# Patient Record
Sex: Female | Born: 1945 | Race: White | Hispanic: No | Marital: Married | Smoking: Former smoker
Health system: Southern US, Community
[De-identification: ages and names within clinical notes are randomized; demographics above are authoritative.]

## PROBLEM LIST (undated history)

## (undated) DIAGNOSIS — I340 Nonrheumatic mitral (valve) insufficiency: Secondary | ICD-10-CM

## (undated) DIAGNOSIS — Z9289 Personal history of other medical treatment: Secondary | ICD-10-CM

## (undated) DIAGNOSIS — I219 Acute myocardial infarction, unspecified: Secondary | ICD-10-CM

## (undated) DIAGNOSIS — I639 Cerebral infarction, unspecified: Secondary | ICD-10-CM

## (undated) DIAGNOSIS — G5600 Carpal tunnel syndrome, unspecified upper limb: Secondary | ICD-10-CM

## (undated) DIAGNOSIS — D649 Anemia, unspecified: Secondary | ICD-10-CM

## (undated) DIAGNOSIS — K635 Polyp of colon: Secondary | ICD-10-CM

## (undated) DIAGNOSIS — C569 Malignant neoplasm of unspecified ovary: Secondary | ICD-10-CM

## (undated) DIAGNOSIS — K659 Peritonitis, unspecified: Secondary | ICD-10-CM

## (undated) DIAGNOSIS — E785 Hyperlipidemia, unspecified: Secondary | ICD-10-CM

## (undated) DIAGNOSIS — I2510 Atherosclerotic heart disease of native coronary artery without angina pectoris: Secondary | ICD-10-CM

## (undated) DIAGNOSIS — I10 Essential (primary) hypertension: Secondary | ICD-10-CM

## (undated) DIAGNOSIS — N186 End stage renal disease: Secondary | ICD-10-CM

## (undated) HISTORY — PX: CORONARY ARTERY BYPASS GRAFT: SHX141

## (undated) HISTORY — DX: Essential (primary) hypertension: I10

## (undated) HISTORY — DX: Hyperlipidemia, unspecified: E78.5

## (undated) HISTORY — DX: Carpal tunnel syndrome, unspecified upper limb: G56.00

## (undated) HISTORY — DX: Malignant neoplasm of unspecified ovary: C56.9

## (undated) HISTORY — DX: Atherosclerotic heart disease of native coronary artery without angina pectoris: I25.10

## (undated) HISTORY — DX: Anemia, unspecified: D64.9

## (undated) HISTORY — PX: CARDIAC CATHETERIZATION: SHX172

## (undated) HISTORY — PX: TONSILLECTOMY: SUR1361

## (undated) HISTORY — DX: Cerebral infarction, unspecified: I63.9

## (undated) HISTORY — PX: ABDOMINAL HYSTERECTOMY: SHX81

---

## 1993-03-25 DIAGNOSIS — C569 Malignant neoplasm of unspecified ovary: Secondary | ICD-10-CM

## 1993-03-25 HISTORY — DX: Malignant neoplasm of unspecified ovary: C56.9

## 1993-03-25 HISTORY — PX: OTHER SURGICAL HISTORY: SHX169

## 1997-07-31 ENCOUNTER — Emergency Department (HOSPITAL_COMMUNITY): Admission: EM | Admit: 1997-07-31 | Discharge: 1997-07-31 | Payer: Self-pay | Admitting: Emergency Medicine

## 1999-04-04 ENCOUNTER — Encounter: Payer: Self-pay | Admitting: Gynecology

## 1999-04-04 ENCOUNTER — Encounter: Admission: RE | Admit: 1999-04-04 | Discharge: 1999-04-04 | Payer: Self-pay | Admitting: Gynecology

## 1999-04-25 ENCOUNTER — Encounter: Payer: Self-pay | Admitting: Gynecology

## 1999-04-25 ENCOUNTER — Encounter: Admission: RE | Admit: 1999-04-25 | Discharge: 1999-04-25 | Payer: Self-pay | Admitting: Gynecology

## 1999-07-16 ENCOUNTER — Encounter: Payer: Self-pay | Admitting: Internal Medicine

## 1999-07-16 ENCOUNTER — Ambulatory Visit (HOSPITAL_COMMUNITY): Admission: RE | Admit: 1999-07-16 | Discharge: 1999-07-16 | Payer: Self-pay | Admitting: Internal Medicine

## 2000-04-08 ENCOUNTER — Encounter: Admission: RE | Admit: 2000-04-08 | Discharge: 2000-04-08 | Payer: Self-pay | Admitting: Gynecology

## 2000-04-08 ENCOUNTER — Encounter: Payer: Self-pay | Admitting: Gynecology

## 2000-04-09 ENCOUNTER — Other Ambulatory Visit: Admission: RE | Admit: 2000-04-09 | Discharge: 2000-04-09 | Payer: Self-pay | Admitting: Gynecology

## 2000-04-14 ENCOUNTER — Encounter: Admission: RE | Admit: 2000-04-14 | Discharge: 2000-04-14 | Payer: Self-pay | Admitting: Internal Medicine

## 2000-04-14 ENCOUNTER — Encounter: Payer: Self-pay | Admitting: Internal Medicine

## 2001-04-20 ENCOUNTER — Encounter: Payer: Self-pay | Admitting: Gynecology

## 2001-04-20 ENCOUNTER — Encounter: Admission: RE | Admit: 2001-04-20 | Discharge: 2001-04-20 | Payer: Self-pay | Admitting: Gynecology

## 2001-04-24 ENCOUNTER — Other Ambulatory Visit: Admission: RE | Admit: 2001-04-24 | Discharge: 2001-04-24 | Payer: Self-pay | Admitting: Gynecology

## 2001-06-04 ENCOUNTER — Ambulatory Visit (HOSPITAL_COMMUNITY): Admission: RE | Admit: 2001-06-04 | Discharge: 2001-06-04 | Payer: Self-pay | Admitting: Gastroenterology

## 2002-04-30 ENCOUNTER — Other Ambulatory Visit: Admission: RE | Admit: 2002-04-30 | Discharge: 2002-04-30 | Payer: Self-pay | Admitting: Gynecology

## 2002-07-14 ENCOUNTER — Encounter: Payer: Self-pay | Admitting: Gynecology

## 2002-07-14 ENCOUNTER — Encounter: Admission: RE | Admit: 2002-07-14 | Discharge: 2002-07-14 | Payer: Self-pay | Admitting: Gynecology

## 2002-07-21 ENCOUNTER — Ambulatory Visit: Admission: RE | Admit: 2002-07-21 | Discharge: 2002-07-21 | Payer: Self-pay | Admitting: Gynecology

## 2002-09-24 ENCOUNTER — Encounter: Admission: RE | Admit: 2002-09-24 | Discharge: 2002-09-24 | Payer: Self-pay | Admitting: Gynecology

## 2002-09-24 ENCOUNTER — Ambulatory Visit (HOSPITAL_COMMUNITY): Admission: RE | Admit: 2002-09-24 | Discharge: 2002-09-24 | Payer: Self-pay | Admitting: Gynecology

## 2002-09-24 ENCOUNTER — Encounter: Payer: Self-pay | Admitting: Gynecology

## 2002-09-29 ENCOUNTER — Ambulatory Visit: Admission: RE | Admit: 2002-09-29 | Discharge: 2002-09-29 | Payer: Self-pay | Admitting: Gynecology

## 2002-10-20 ENCOUNTER — Emergency Department (HOSPITAL_COMMUNITY): Admission: EM | Admit: 2002-10-20 | Discharge: 2002-10-20 | Payer: Self-pay | Admitting: Emergency Medicine

## 2002-10-20 ENCOUNTER — Encounter: Payer: Self-pay | Admitting: Emergency Medicine

## 2003-01-24 ENCOUNTER — Ambulatory Visit (HOSPITAL_COMMUNITY): Admission: RE | Admit: 2003-01-24 | Discharge: 2003-01-24 | Payer: Self-pay | Admitting: Gynecology

## 2003-01-26 ENCOUNTER — Ambulatory Visit: Admission: RE | Admit: 2003-01-26 | Discharge: 2003-01-26 | Payer: Self-pay | Admitting: Gynecology

## 2003-02-11 ENCOUNTER — Encounter: Admission: RE | Admit: 2003-02-11 | Discharge: 2003-02-11 | Payer: Self-pay | Admitting: Gynecology

## 2003-04-25 ENCOUNTER — Ambulatory Visit (HOSPITAL_COMMUNITY): Admission: RE | Admit: 2003-04-25 | Discharge: 2003-04-25 | Payer: Self-pay | Admitting: Gynecology

## 2003-08-08 ENCOUNTER — Ambulatory Visit (HOSPITAL_COMMUNITY): Admission: RE | Admit: 2003-08-08 | Discharge: 2003-08-08 | Payer: Self-pay | Admitting: Gynecology

## 2003-08-10 ENCOUNTER — Ambulatory Visit: Admission: RE | Admit: 2003-08-10 | Discharge: 2003-08-10 | Payer: Self-pay | Admitting: Gynecology

## 2003-10-03 ENCOUNTER — Encounter: Admission: RE | Admit: 2003-10-03 | Discharge: 2003-10-03 | Payer: Self-pay | Admitting: Gynecology

## 2003-11-22 ENCOUNTER — Encounter: Admission: RE | Admit: 2003-11-22 | Discharge: 2003-11-22 | Payer: Self-pay | Admitting: Family Medicine

## 2004-08-06 ENCOUNTER — Other Ambulatory Visit: Admission: RE | Admit: 2004-08-06 | Discharge: 2004-08-06 | Payer: Self-pay | Admitting: Gynecology

## 2004-12-31 ENCOUNTER — Encounter: Admission: RE | Admit: 2004-12-31 | Discharge: 2004-12-31 | Payer: Self-pay | Admitting: Gynecology

## 2005-04-22 ENCOUNTER — Ambulatory Visit (HOSPITAL_COMMUNITY): Admission: RE | Admit: 2005-04-22 | Discharge: 2005-04-22 | Payer: Self-pay | Admitting: Family Medicine

## 2005-08-06 ENCOUNTER — Other Ambulatory Visit: Admission: RE | Admit: 2005-08-06 | Discharge: 2005-08-06 | Payer: Self-pay | Admitting: Gynecology

## 2005-09-02 ENCOUNTER — Encounter: Admission: RE | Admit: 2005-09-02 | Discharge: 2005-09-02 | Payer: Self-pay | Admitting: Orthopedic Surgery

## 2005-09-16 ENCOUNTER — Encounter: Admission: RE | Admit: 2005-09-16 | Discharge: 2005-09-16 | Payer: Self-pay | Admitting: Orthopedic Surgery

## 2006-04-05 ENCOUNTER — Inpatient Hospital Stay (HOSPITAL_COMMUNITY): Admission: EM | Admit: 2006-04-05 | Discharge: 2006-04-09 | Payer: Self-pay | Admitting: Emergency Medicine

## 2006-04-06 ENCOUNTER — Encounter (INDEPENDENT_AMBULATORY_CARE_PROVIDER_SITE_OTHER): Payer: Self-pay | Admitting: Interventional Cardiology

## 2006-05-03 ENCOUNTER — Inpatient Hospital Stay (HOSPITAL_COMMUNITY): Admission: EM | Admit: 2006-05-03 | Discharge: 2006-05-22 | Payer: Self-pay | Admitting: Emergency Medicine

## 2006-05-05 ENCOUNTER — Ambulatory Visit: Payer: Self-pay | Admitting: Cardiovascular Disease

## 2006-05-13 ENCOUNTER — Ambulatory Visit: Payer: Self-pay | Admitting: Cardiology

## 2006-05-14 ENCOUNTER — Ambulatory Visit: Payer: Self-pay | Admitting: Vascular Surgery

## 2006-05-14 ENCOUNTER — Encounter (INDEPENDENT_AMBULATORY_CARE_PROVIDER_SITE_OTHER): Payer: Self-pay | Admitting: *Deleted

## 2006-05-16 ENCOUNTER — Encounter: Payer: Self-pay | Admitting: Cardiology

## 2006-07-14 ENCOUNTER — Ambulatory Visit: Payer: Self-pay | Admitting: Cardiology

## 2006-07-22 ENCOUNTER — Encounter (HOSPITAL_COMMUNITY): Admission: RE | Admit: 2006-07-22 | Discharge: 2006-08-21 | Payer: Self-pay | Admitting: Cardiology

## 2006-08-12 ENCOUNTER — Other Ambulatory Visit: Admission: RE | Admit: 2006-08-12 | Discharge: 2006-08-12 | Payer: Self-pay | Admitting: Gynecology

## 2006-08-21 ENCOUNTER — Ambulatory Visit: Payer: Self-pay | Admitting: Cardiology

## 2006-08-22 ENCOUNTER — Ambulatory Visit (HOSPITAL_COMMUNITY): Payer: Self-pay | Admitting: Nephrology

## 2006-08-22 ENCOUNTER — Encounter (HOSPITAL_COMMUNITY): Admission: RE | Admit: 2006-08-22 | Discharge: 2006-09-21 | Payer: Self-pay | Admitting: Nephrology

## 2006-08-25 ENCOUNTER — Ambulatory Visit: Payer: Self-pay | Admitting: Cardiology

## 2006-08-25 LAB — CONVERTED CEMR LAB
AST: 13 units/L (ref 0–37)
Albumin: 3.6 g/dL (ref 3.5–5.2)
Alkaline Phosphatase: 58 units/L (ref 39–117)
Basophils Absolute: 0.1 10*3/uL (ref 0.0–0.1)
Bilirubin, Direct: 0.1 mg/dL (ref 0.0–0.3)
Cholesterol: 205 mg/dL (ref 0–200)
Eosinophils Absolute: 0.2 10*3/uL (ref 0.0–0.6)
Hemoglobin: 11.5 g/dL — ABNORMAL LOW (ref 12.0–15.0)
MCHC: 34.9 g/dL (ref 30.0–36.0)
MCV: 92.3 fL (ref 78.0–100.0)
Platelets: 309 10*3/uL (ref 150–400)
Total Protein: 7.7 g/dL (ref 6.0–8.3)
Triglycerides: 231 mg/dL (ref 0–149)
VLDL: 46 mg/dL — ABNORMAL HIGH (ref 0–40)
WBC: 6.4 10*3/uL (ref 4.5–10.5)

## 2006-09-01 ENCOUNTER — Encounter: Admission: RE | Admit: 2006-09-01 | Discharge: 2006-09-01 | Payer: Self-pay | Admitting: Gynecology

## 2006-10-01 ENCOUNTER — Encounter (HOSPITAL_COMMUNITY): Admission: RE | Admit: 2006-10-01 | Discharge: 2006-10-31 | Payer: Self-pay | Admitting: Nephrology

## 2006-10-16 ENCOUNTER — Ambulatory Visit (HOSPITAL_COMMUNITY): Payer: Self-pay | Admitting: Nephrology

## 2006-10-27 ENCOUNTER — Ambulatory Visit: Payer: Self-pay

## 2006-11-04 ENCOUNTER — Encounter (HOSPITAL_COMMUNITY): Admission: RE | Admit: 2006-11-04 | Discharge: 2006-12-04 | Payer: Self-pay | Admitting: Oncology

## 2006-11-04 ENCOUNTER — Ambulatory Visit: Payer: Self-pay | Admitting: Cardiology

## 2006-12-01 ENCOUNTER — Emergency Department (HOSPITAL_COMMUNITY): Admission: EM | Admit: 2006-12-01 | Discharge: 2006-12-02 | Payer: Self-pay | Admitting: Emergency Medicine

## 2006-12-09 ENCOUNTER — Ambulatory Visit (HOSPITAL_COMMUNITY): Payer: Self-pay | Admitting: Oncology

## 2006-12-09 ENCOUNTER — Encounter (HOSPITAL_COMMUNITY): Admission: RE | Admit: 2006-12-09 | Discharge: 2006-12-23 | Payer: Self-pay | Admitting: Nephrology

## 2006-12-15 ENCOUNTER — Ambulatory Visit: Payer: Self-pay | Admitting: Internal Medicine

## 2006-12-31 ENCOUNTER — Emergency Department (HOSPITAL_COMMUNITY): Admission: EM | Admit: 2006-12-31 | Discharge: 2006-12-31 | Payer: Self-pay | Admitting: Emergency Medicine

## 2007-01-10 ENCOUNTER — Emergency Department (HOSPITAL_COMMUNITY): Admission: EM | Admit: 2007-01-10 | Discharge: 2007-01-10 | Payer: Self-pay | Admitting: Emergency Medicine

## 2007-01-11 ENCOUNTER — Observation Stay (HOSPITAL_COMMUNITY): Admission: EM | Admit: 2007-01-11 | Discharge: 2007-01-12 | Payer: Self-pay | Admitting: Emergency Medicine

## 2007-02-24 ENCOUNTER — Ambulatory Visit: Payer: Self-pay | Admitting: Cardiology

## 2007-03-02 ENCOUNTER — Ambulatory Visit: Payer: Self-pay | Admitting: Cardiology

## 2007-03-02 LAB — CONVERTED CEMR LAB
AST: 16 units/L (ref 0–37)
Albumin: 3.6 g/dL (ref 3.5–5.2)
Total Bilirubin: 0.6 mg/dL (ref 0.3–1.2)
Total Protein: 7 g/dL (ref 6.0–8.3)

## 2007-03-05 ENCOUNTER — Inpatient Hospital Stay (HOSPITAL_COMMUNITY): Admission: RE | Admit: 2007-03-05 | Discharge: 2007-03-10 | Payer: Self-pay | Admitting: Specialist

## 2007-03-26 DIAGNOSIS — I219 Acute myocardial infarction, unspecified: Secondary | ICD-10-CM

## 2007-03-26 HISTORY — DX: Acute myocardial infarction, unspecified: I21.9

## 2007-03-26 HISTORY — PX: BACK SURGERY: SHX140

## 2007-04-13 ENCOUNTER — Ambulatory Visit (HOSPITAL_COMMUNITY): Payer: Self-pay | Admitting: Nephrology

## 2007-04-13 ENCOUNTER — Encounter (HOSPITAL_COMMUNITY): Admission: RE | Admit: 2007-04-13 | Discharge: 2007-05-13 | Payer: Self-pay | Admitting: Oncology

## 2007-06-02 ENCOUNTER — Encounter (HOSPITAL_COMMUNITY): Admission: RE | Admit: 2007-06-02 | Discharge: 2007-07-02 | Payer: Self-pay | Admitting: Nephrology

## 2007-06-15 ENCOUNTER — Ambulatory Visit (HOSPITAL_COMMUNITY): Payer: Self-pay | Admitting: Nephrology

## 2007-06-25 ENCOUNTER — Ambulatory Visit: Payer: Self-pay | Admitting: Cardiology

## 2007-06-25 LAB — CONVERTED CEMR LAB
BUN: 83 mg/dL — ABNORMAL HIGH (ref 6–23)
Basophils Absolute: 0.1 10*3/uL (ref 0.0–0.1)
Basophils Relative: 1.9 % — ABNORMAL HIGH (ref 0.0–1.0)
Chloride: 109 meq/L (ref 96–112)
Creatinine, Ser: 3.4 mg/dL — ABNORMAL HIGH (ref 0.4–1.2)
Eosinophils Absolute: 0.2 10*3/uL (ref 0.0–0.7)
Eosinophils Relative: 3.6 % (ref 0.0–5.0)
GFR calc non Af Amer: 14 mL/min
Glucose, Bld: 105 mg/dL — ABNORMAL HIGH (ref 70–99)
HCT: 31.2 % — ABNORMAL LOW (ref 36.0–46.0)
Hemoglobin: 10 g/dL — ABNORMAL LOW (ref 12.0–15.0)
Monocytes Relative: 9.3 % (ref 3.0–12.0)
Neutro Abs: 3.7 10*3/uL (ref 1.4–7.7)
Neutrophils Relative %: 64.9 % (ref 43.0–77.0)
Platelets: 206 10*3/uL (ref 150–400)
Potassium: 4.4 meq/L (ref 3.5–5.1)
Pro B Natriuretic peptide (BNP): 939 pg/mL — ABNORMAL HIGH (ref 0.0–100.0)
RDW: 14.5 % (ref 11.5–14.6)
WBC: 5.7 10*3/uL (ref 4.5–10.5)

## 2007-07-06 ENCOUNTER — Encounter: Payer: Self-pay | Admitting: Cardiology

## 2007-07-06 ENCOUNTER — Ambulatory Visit: Payer: Self-pay

## 2007-07-27 ENCOUNTER — Encounter (HOSPITAL_COMMUNITY): Admission: RE | Admit: 2007-07-27 | Discharge: 2007-08-26 | Payer: Self-pay | Admitting: Nephrology

## 2007-08-11 ENCOUNTER — Ambulatory Visit (HOSPITAL_COMMUNITY): Payer: Self-pay | Admitting: Nephrology

## 2007-10-23 ENCOUNTER — Ambulatory Visit: Payer: Self-pay | Admitting: Cardiology

## 2008-03-25 DIAGNOSIS — I639 Cerebral infarction, unspecified: Secondary | ICD-10-CM

## 2008-03-25 HISTORY — DX: Cerebral infarction, unspecified: I63.9

## 2008-05-05 ENCOUNTER — Encounter: Admission: RE | Admit: 2008-05-05 | Discharge: 2008-05-05 | Payer: Self-pay | Admitting: Family Medicine

## 2008-06-27 ENCOUNTER — Emergency Department (HOSPITAL_COMMUNITY): Admission: EM | Admit: 2008-06-27 | Discharge: 2008-06-27 | Payer: Self-pay | Admitting: Emergency Medicine

## 2008-07-12 ENCOUNTER — Telehealth (INDEPENDENT_AMBULATORY_CARE_PROVIDER_SITE_OTHER): Payer: Self-pay | Admitting: *Deleted

## 2008-09-17 DIAGNOSIS — I10 Essential (primary) hypertension: Secondary | ICD-10-CM

## 2008-09-17 DIAGNOSIS — I498 Other specified cardiac arrhythmias: Secondary | ICD-10-CM

## 2008-09-17 DIAGNOSIS — E875 Hyperkalemia: Secondary | ICD-10-CM | POA: Insufficient documentation

## 2008-09-17 DIAGNOSIS — N186 End stage renal disease: Secondary | ICD-10-CM

## 2008-09-17 DIAGNOSIS — E119 Type 2 diabetes mellitus without complications: Secondary | ICD-10-CM

## 2008-09-17 DIAGNOSIS — R55 Syncope and collapse: Secondary | ICD-10-CM | POA: Insufficient documentation

## 2008-10-02 DIAGNOSIS — E785 Hyperlipidemia, unspecified: Secondary | ICD-10-CM

## 2008-10-02 DIAGNOSIS — I5032 Chronic diastolic (congestive) heart failure: Secondary | ICD-10-CM

## 2008-10-02 DIAGNOSIS — I2510 Atherosclerotic heart disease of native coronary artery without angina pectoris: Secondary | ICD-10-CM | POA: Insufficient documentation

## 2008-10-04 ENCOUNTER — Ambulatory Visit: Payer: Self-pay | Admitting: Cardiology

## 2008-10-18 ENCOUNTER — Ambulatory Visit: Payer: Self-pay

## 2008-10-18 ENCOUNTER — Encounter: Payer: Self-pay | Admitting: Cardiology

## 2008-11-10 ENCOUNTER — Encounter (INDEPENDENT_AMBULATORY_CARE_PROVIDER_SITE_OTHER): Payer: Self-pay | Admitting: *Deleted

## 2008-12-06 ENCOUNTER — Telehealth: Payer: Self-pay | Admitting: Cardiology

## 2009-01-06 ENCOUNTER — Telehealth: Payer: Self-pay | Admitting: Cardiology

## 2009-01-10 ENCOUNTER — Encounter: Payer: Self-pay | Admitting: Cardiology

## 2009-01-25 ENCOUNTER — Telehealth: Payer: Self-pay | Admitting: Cardiology

## 2009-05-05 ENCOUNTER — Emergency Department (HOSPITAL_COMMUNITY): Admission: EM | Admit: 2009-05-05 | Discharge: 2009-05-05 | Payer: Self-pay | Admitting: Emergency Medicine

## 2009-05-12 ENCOUNTER — Encounter: Admission: RE | Admit: 2009-05-12 | Discharge: 2009-05-12 | Payer: Self-pay | Admitting: Gynecology

## 2009-11-16 ENCOUNTER — Ambulatory Visit: Payer: Self-pay | Admitting: Cardiology

## 2009-11-17 ENCOUNTER — Telehealth (INDEPENDENT_AMBULATORY_CARE_PROVIDER_SITE_OTHER): Payer: Self-pay | Admitting: *Deleted

## 2010-04-15 ENCOUNTER — Encounter: Payer: Self-pay | Admitting: Orthopedic Surgery

## 2010-04-24 ENCOUNTER — Other Ambulatory Visit (HOSPITAL_COMMUNITY): Payer: Self-pay | Admitting: Gynecology

## 2010-04-24 DIAGNOSIS — Z139 Encounter for screening, unspecified: Secondary | ICD-10-CM

## 2010-04-24 NOTE — Assessment & Plan Note (Signed)
Summary: yearly   Visit Type:  Follow-up Primary Provider:  Dr Betha Loa [name]([facility]) Dr Erling Cruz  CC:  NO CARDIAC COMPLAINTS.  History of Present Illness: The patient is 65 years old and return for management of CAD. She has end-stage renal disease and is on chronic hemodialysis in . She has CAD and had a drug and stent placement the circumflex artery in 2008. She had total occlusion of the LAD at that time. An echocardiogram done last year which showed an ejection fraction of 55%.  She's had no recent chest pain shortness of breath or palpitations. She has gained some weight but this does not appear to be fluid weight. Previously she was having problems with hypotension with dialysis but we stopped her Norvasc and she's had no subsequent problems. Her blood pressures have still been fairly well controlled.  Her other problems include hyperlipidemia and diabetes.  Current Medications (verified): 1)  Aspirin 81 Mg Tbec (Aspirin) .... Take One Tablet By Mouth Daily 2)  Metoprolol Tartrate 50 Mg Tabs (Metoprolol Tartrate) .... Take One Tablet By Mouth Twice A Day 3)  Simvastatin 40 Mg Tabs (Simvastatin) .... Take One Tablet By Mouth Daily At Bedtime 4)  Novolin 70/30 70-30 % Susp (Insulin Isophane & Regular) .... As Directed  Allergies (verified): No Known Drug Allergies  Past History:  Past Medical History: Reviewed history from 10/02/2008 and no changes required. BRADYCARDIA (ICD-427.89) HYPERTENSION (ICD-401.9) HYPERKALEMIA (ICD-276.7) RENAL INSUFFICIENCY, CHRONIC (ICD-585.9) DM (ICD-250.00) SYNCOPE (ICD-780.2)  1. Coronary artery disease, status post percutaneous coronary     intervention of the circumflex artery with drug-eluting stent with     chronic total occlusion of the right coronary. 2. History of systolic and diastolic heart failure. 3. Ejection fraction improved to 55% by last echocardiogram. 4. Chronic end-stage renal disease, now on  chronic dialysis. 5. Hypertension. 6. Hyperlipidemia. 7. Type 2 diabetes.   Review of Systems       ROS is negative except as outlined in HPI.   Vital Signs:  Patient profile:   65 year old female Height:      65 inches Weight:      167 pounds BMI:     27.89 Pulse rate:   65 / minute BP sitting:   150 / 66  (left arm) Cuff size:   large  Vitals Entered By: Burnett Kanaris, CNA (November 16, 2009 2:30 PM)  Physical Exam  Additional Exam:  Gen. Well-nourished, in no distress   Neck: No JVD, thyroid not enlarged, no carotid bruits Lungs: No tachypnea, clear without rales, rhonchi or wheezes Cardiovascular: Rhythm regular, PMI not displaced,  heart sounds  normal, no murmurs or gallops, no peripheral edema, pulses normal in all 4 extremities. Abdomen: BS normal, abdomen soft and non-tender without masses or organomegaly, no hepatosplenomegaly. MS: No deformities, no cyanosis or clubbing   Neuro:  No focal sns   Skin:  no lesions    Impression & Recommendations:  Problem # 1:  CAD, NATIVE VESSEL (ICD-414.01) She had a drilling stent to the circumflex artery in 2008. She's had no recent chest pain and his palm appears stable. Her updated medication list for this problem includes:    Aspirin 81 Mg Tbec (Aspirin) .Marland Kitchen... Take one tablet by mouth daily    Metoprolol Tartrate 50 Mg Tabs (Metoprolol tartrate) .Marland Kitchen... Take one tablet by mouth twice a day  Orders: EKG w/ Interpretation (93000)  Problem # 2:  RENAL INSUFFICIENCY, CHRONIC (ICD-585.9) She has end-stage renal disease and  is on chronic hemodialysis.  Problem # 3:  HYPERTENSION (ICD-401.9) This is reasonably well controlled on current medications. Her updated medication list for this problem includes:    Aspirin 81 Mg Tbec (Aspirin) .Marland Kitchen... Take one tablet by mouth daily    Metoprolol Tartrate 50 Mg Tabs (Metoprolol tartrate) .Marland Kitchen... Take one tablet by mouth twice a day  Patient Instructions: 1)  Your physician recommends that  you continue on your current medications as directed. Please refer to the Current Medication list given to you today. 2)  Your physician wants you to follow-up in: 1 year with Dr. Clifton James.  You will receive a reminder letter in the mail two months in advance. If you don't receive a letter, please call our office to schedule the follow-up appointment.

## 2010-05-14 ENCOUNTER — Ambulatory Visit (HOSPITAL_COMMUNITY)
Admission: RE | Admit: 2010-05-14 | Discharge: 2010-05-14 | Disposition: A | Discharge status: Home / Self Care | Payer: Medicare Other | Source: Ambulatory Visit | Attending: Gynecology | Admitting: Gynecology

## 2010-05-14 DIAGNOSIS — Z1231 Encounter for screening mammogram for malignant neoplasm of breast: Secondary | ICD-10-CM | POA: Insufficient documentation

## 2010-05-14 DIAGNOSIS — Z139 Encounter for screening, unspecified: Secondary | ICD-10-CM

## 2010-06-11 ENCOUNTER — Other Ambulatory Visit: Payer: Self-pay | Admitting: Gynecology

## 2010-06-11 DIAGNOSIS — N6310 Unspecified lump in the right breast, unspecified quadrant: Secondary | ICD-10-CM

## 2010-06-13 LAB — CBC
MCV: 99.4 fL (ref 78.0–100.0)
Platelets: 192 10*3/uL (ref 150–400)
WBC: 7 10*3/uL (ref 4.0–10.5)

## 2010-06-13 LAB — DIFFERENTIAL
Basophils Absolute: 0 10*3/uL (ref 0.0–0.1)
Eosinophils Relative: 1 % (ref 0–5)
Lymphs Abs: 0.6 10*3/uL — ABNORMAL LOW (ref 0.7–4.0)
Monocytes Absolute: 0.3 10*3/uL (ref 0.1–1.0)
Monocytes Relative: 4 % (ref 3–12)
Neutro Abs: 6.1 10*3/uL (ref 1.7–7.7)
Neutrophils Relative %: 87 % — ABNORMAL HIGH (ref 43–77)

## 2010-06-13 LAB — BASIC METABOLIC PANEL: Potassium: 5.1 mEq/L (ref 3.5–5.1)

## 2010-06-15 ENCOUNTER — Other Ambulatory Visit: Payer: No Typology Code available for payment source

## 2010-06-29 ENCOUNTER — Ambulatory Visit
Admission: RE | Admit: 2010-06-29 | Discharge: 2010-06-29 | Disposition: A | Discharge status: Home / Self Care | Payer: Medicare Other | Source: Ambulatory Visit | Attending: Gynecology | Admitting: Gynecology

## 2010-06-29 DIAGNOSIS — N6310 Unspecified lump in the right breast, unspecified quadrant: Secondary | ICD-10-CM

## 2010-08-07 NOTE — Assessment & Plan Note (Signed)
[facility]                            CARDIOLOGY OFFICE NOTE   NAME:[name], [name]                       MRN:          [id number]  DATE:12/15/2006                            DOB:          [birth year]    PRIMARY CARDIOLOGIST:  Everardo Beals. Juanda Chance, MD, [facility].   PRIMARY CARE PHYSICIAN:  Holley Bouche, M.D.   PRIMARY NEPHROLOGIST:  Jorja Loa, M.D., [facility] [place].   SURGEON:  Kerrin Champagne, M.D.   PATIENT PROFILE:  A 65 year old Caucasian female with prior history of  CAD status post non-ST elevation MI who presents for preoperative  clearance pending back surgery.   PROBLEM LIST:  1. Coronary artery disease.      a.     Non-ST segment elevation myocardial infarction February       2008, with catheterization and subsequent percutaneous coronary       intervention and drug eluting stent placement to the circumflex       marginal (2.5 x 16 mm Taxus drug eluting stent).  She has residual       total occlusion of the left anterior descending.      b.     October 27, 2006, adenosine Myoview, ejection fraction 63%.       She had distal lateral and apical infarct without ischemia.  Felt       to be a low risk study.  2. Hypertension.  3. Normal left ventricular function with an EF of 63% by Myoview.  4. History of systolic congestive heart failure at the time of her      myocardial infarction and then compensated with improved left      ventricular function.  5. 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  6. Hypertension.  7. Hyperlipidemia.  8. Type 2 diabetes mellitus.   HISTORY OF PRESENT ILLNESS:  The patient was last seen in clinic on  August 2008.  Just prior to that visit, she had a Myoview which showed  infarct without evidence of ischemia.  She is pending back surgery with  Dr. Otelia Sergeant and presents today for preoperative clearance.  She more or  less has already had preoperative clearance based on her  prior  functional study.  She is active, walking 10 minutes two to three times  per day without chest pain or shortness of breath.  She is somewhat  limited in her activity secondary to her back discomfort.  Her blood  pressure at home normally runs 120-130/40-50, although it is somewhat  elevated today in the 160s.  She said that is unusual for her but that  she had pork barbecue earlier today and that is something that would  normally drive up her blood pressure.  She otherwise denies any PND or  orthopnea, dizziness, syncope, edema, early satiety.   HOME MEDICATIONS:  1. Plavix 75 mg daily.  2. Norvasc 10 mg daily.  3. Lopressor 100 mg b.i.d.  4. Lasix 80 mg half tablet daily.  5. Novolin 70/30 as directed.  6. Aspirin 81 mg daily.  7. Simvastatin 40 mg daily.  PHYSICAL EXAMINATION:  VITAL SIGNS:  Blood pressure 160/68 and 160/52 on  repeat, heart rate 66, respirations 16, she is 147 pounds, down 3 pounds  since last visit.  GENERAL APPEARANCE:  A pleasant white female in no acute distress.  Awake, alert and oriented x3.  HEENT:  Normal.  NECK:  No bruits or JVD.  LUNGS:  Respirations were unlabored, clear to auscultation.  CARDIOVASCULAR:  Regular S1 and S2, no S3, S4 or murmurs.  ABDOMEN:  Round, soft, nontender, nondistended, bowel sounds present x4.  EXTREMITIES:  Warm and dry, pink, no clubbing, cyanosis, or edema.  PULSES:  Dorsalis pedis, posterior tibial pulses 2+ and equal  bilaterally.  NEUROLOGIC:  Grossly intact and nonfocal.   ACCESSORY AND CLINICAL FINDINGS:  EKG  shows sinus rhythm with prior  anteroseptal infarct and a left axis deviation.   ASSESSMENT/PLAN:  1. Coronary artery disease.  She is doing well from a cardiac      standpoint and is active without chest pain or dyspnea.  She      remains on aspirin, Plavix, beta-blocker, Statin therapy.  She is      pending back surgery and had a Myoview in August that showed prior      infarct without evidence  of ischemia.  From the standpoint of the      Myoview, she is felt to be low risk for surgery.  Would recommend      continuation of beta-blocker throughout the perioperative period.      Most notably, she is status post drug eluting stent placement in      February 2008, and we would recommend continuation of Plavix for a      full year prior to stopping this.  Otherwise, she remains on      aspirin and Statin therapy.  2. Hypertension.  Blood pressure is elevated today, although she says      this is unusual for her and attributes it to eating barbecue this      morning.  Her pressures normally run 120s-130s/40s-50s at home and      she is advised to continue following her blood pressure at home and      if she is consistently greater than 130, to give [place] a call so we      can adjust her medications.  Otherwise, we will make no changes      based on the readings today.  3. Hyperlipidemia.  Continue Statin therapy.  4. Diabetes mellitus.  Followed by primary care and she is on 70/30.  5. Bulging disks and back pain.  Pending surgery with Dr. Otelia Sergeant.      Nicolasa Ducking, ANP  Electronically Signed      Pricilla Riffle, MD, [facility]  Electronically Signed   CB/MedQ  DD: 12/15/2006  DT: 12/16/2006  Job #: [id number]

## 2010-08-07 NOTE — Assessment & Plan Note (Signed)
[facility]                            CARDIOLOGY OFFICE NOTE   NAME:Eckley, CHAKIRA JACHIM                       MRN:          [id number]  DATE:08/21/2006                            DOB:          May 18, 1945    PRIMARY CARE PHYSICIAN:  Holley Bouche, M.D.   NEPHROLOGIST:  Jorja Loa, M.D.   CLINICAL HISTORY:  Ms. Kehm is 65 years old and was born and raised in  [place], but moved with her husband to [place] about a year ago.  In February of this year, she was hospitalized at [facility] with chest  pain, acute pulmonary edema and respiratory failure requiring  ventilation. She also had acute and chronic renal insufficiency with  creatinines in the 4 range. She finally stabilized and improved. Her  enzymes were consistent with a non-ST elevation myocardial infarction.  She underwent catheterization and was found to have a totally occluded  LAD and a high-grade lesion in the circumflex marginal vessel. She had a  staged procedure with drug-eluting stents placed in the marginal branch  of the circumflex artery.   She has done well since that time. She has been in the rehab program at  [facility] and has gradually gained strength. She saw Tereso Newcomer, here  in our office, last month and she has been seen regularly by Dr.  Kristian Covey in [place]. She says her creatinines have been in the range  of 5 to 6.   PAST MEDICAL HISTORY:  1. Congenitally absent one kidney.  2. Hypertension.  3. Hyperlipidemia.   MEDICATIONS:  1. Plavix.  2. Norvasc.  3. Lopressor.  4. Lasix.  5. Insulin.  6. Aspirin.   PHYSICAL EXAMINATION:  Blood pressure is 145/71, pulse 70 and regular.  There was no venous distention. The carotid pulses were full without  bruits.  CHEST: Was clear.  CARDIAC: Rhythm was regular. I hear no murmurs or gallops.  ABDOMEN: Was soft with normal bowel sounds. There was no  hepatosplenomegaly.  The peripheral pulses were full  and there was no peripheral edema.  MUSCULOSKELETAL: System showed no deformities.   We did not do an electrocardiogram today. Electrocardiogram from last  time showed an old septal infarct.   IMPRESSION:  1. Coronary artery disease status post non-ST elevation myocardial      infarction in February 2008 with subsequent placement of tandem,      not overlapping drug-eluting stents in the marginal branch of the      circumflex artery with residual anatomy as described above.  2. Congestive heart failure with acute pulmonary edema requiring      intubation, now resolved and now compensated, primarily related to      renal insufficiency and diastolic dysfunction.  3. Chronic renal insufficiency with creatinines in the 4-6 range.  4. Hypertension.  5. Hyperlipidemia.  6. Ejection fraction of 45%.  7. Congenitally absent kidney.   RECOMMENDATIONS:  Ms. Seith has made a remarkable improvement since her  hospitalization. She appears to be stable from a cardiac standpoint. Her  biggest limitation is related to her severe renal insufficiency. She  is  not on a statin and will plan to get a fasting lipid and liver profile  and decide which statin would be most appropriate. She indicates that  she does have insurance and could use a non-generic if needed. Will plan  to see her back in three months which will be August and plan to do an  adenosine rest/stress Myoview scan before that visit. This will help  reevaluate her left ventricular function and also assess her for re-  stenosis. She says that although she has continued to gain strength, her  husband asked her to ask why she is still fairly weak. I think this is  probably due to a combination of her residual coronary disease and her  renal insufficiency primarily. If she has not had a CBC with her recent  BNP we will get that as well.     Bruce Elvera Lennox Juanda Chance, MD, [facility]  Electronically Signed    BRB/MedQ  DD: 08/21/2006  DT: 08/21/2006   Job #: [id number]

## 2010-08-07 NOTE — Op Note (Signed)
NAME:  [name], [name]                ACCOUNT NO.:  [id number]   MEDICAL RECORD NO.:  [id number]          PATIENT TYPE:  INP   LOCATION:  5024                         FACILITY:  [facility]   PHYSICIAN:  Kerrin Champagne, M.D.   DATE OF BIRTH:  01/18/1946   DATE OF PROCEDURE:  03/05/2007  DATE OF DISCHARGE:                               OPERATIVE REPORT   PREOPERATIVE DIAGNOSIS:  Spondylolisthesis L5-S1 with bilateral stenosis  right side greater than left.   POSTOPERATIVE DIAGNOSIS:  Spondylolisthesis L5-S1 with bilateral  stenosis right side greater than left with severe right L5 foraminal  entrapment, bilateral lateral recess stenosis effecting the S1 nerve  roots, moderate left L5 neuroforaminal stenosis.   PROCEDURE:  Central decompressive laminectomy L5-S1 with decompression  of bilateral L5 and bilateral S1 nerve roots.  The decompression carried  up to the L3-4 level, loupe magnification and head lamp used.  Right-  sided transforaminal lumbar interbody fusion using a 9 mm DuPuy Concord  lordotic cage with local bone graft, posterolateral fusion L5-S1 using  local bone graft.   INSTRUMENTATION:  Using DuPuy Monarch pedicle screws and rods from L5 to  S1.   SURGEON:  Kerrin Champagne, M.D.   ASSISTANT:  Wende Neighbors, P.A.   ANESTHESIA:  General via orotracheal intubation [name], M.D.   FINDINGS:  As noted above.   ESTIMATED BLOOD LOSS:  400 mL.  The patient received 80 mL of Cellsaver  blood return.   COMPLICATIONS:  None.   DRAINS:  Hemovac x1 right lower lumbar spine.  Foley to straight drain.   BRIEF CLINICAL HISTORY:  This patient is a 65 year old female who  injured her back on the job almost a year and a half to two years ago.  She has been having severe back pain with radiation to the right leg  greater than left with neurogenic claudication symptoms.  Studies have  demonstrated grade I spondylolisthesis at L5-S1.  Findings suggesting  the problem of  spinal stenosis associated with a spondylolisthesis at  the lowest segment in her back.  She underwent standard preoperative  evaluation, however, her medication conditions precluded surgical  intervention.  She has been diagnosed as having renal insufficiency.  She had recent myocardial infarction.  She has since been deemed to be  stable and a surgical candidate.  She is brought to the operating room  to undergo the aforementioned procedure.   DESCRIPTION OF PROCEDURE:  After adequate general anesthesia, the  patient had a Foley catheter placed, standard preoperative antibiotics,  PAS stockings applied to both lower extremities.  She was then rolled to  a prone position.  Note that neuro monitoring leads had been placed  preoperatively and in the operating room.  All pressure points were well  padded.  Standard prep with DuraPrep solution mid dorsal spine and mid  sacral segment draped in the usual fashion.  Iodine Vi-drape was used.  The initial incision was made from approximately L2-3 to S1-2 in the  midline through the skin and subcutaneous layers, carried down to the  lumbodorsal fascia.  Incision  then made on both sides of the spinous  process of L4, L5, S1 and superiorly to L3.  Kocher clamps were then  placed on the spinous process of L4 and of L5.  Intraoperative lateral  radiograph demonstrating these to be the aforementioned levels at L4 and  L5.  The instrument at L4 was removed and that at L5 and then each of  the spinous processes were then marked with a single electrocautery  mark.  Cobb elevators used to elevate the paralumbar muscles bilaterally  off the posterior aspect of the lamina spinous processes of S1 and S2,  L5, L4, and L3.  The sacra ala and posterior aspect of the sacrum was  carefully exposed out over the lateral aspect of the L5-S1 facet and  then at the L4-5 level, the facet capsule exposure was obtained out over  the transverse process of L5 bilaterally.   Continuing superiorly,  additional exposure was obtained in order to mobilize soft tissue to  allow for placement of screws with adequate convergence and lordosis.  The Viper retractor was placed.  Sponges were used to pack the  paralumbar regions of both sides.  The patient then had Leksell rongeur  used to remove the spinous process of L5, the inferior 50% of the  spinous process of L4, and the superior portion of the S1 spinous  process.  This was then used to additionally thin the posterior aspect  of lamina of L5.  Osteotomes were then used to further resect bone  preserving its integrity so that it could be later used for bone  grafting purposes.  After performing a central laminotomy then at the L5  level, the decompression was carried to the L3-4 level.  The ligamentum  flavum was resected bilaterally, preserving the facet capsules as best  as possible, but decompressing the outer recesses bilaterally due to  hypertrophic changes involving the facet.  Then using this lateral  recess, the patient underwent further decompression out over the L5  nerve roots.  Osteotomy of the medial aspect of the L5-S1 facet was  performed, both right and left side after first performing foraminotomy  over the S1 nerve root and determining the medial border of the S1  pedicle and then using osteotome to resect bone medial to this level at  the L5-S1 facet.  This was then removed further decompressing the spinal  canal.  Using loupe magnification head lamp then carefully the L5 nerve  roots were determined to be in good condition.  Undercutting of the  facet was carried out at the L5-S1 further decompressing the L5 nerve  root on the left side and on the right side the entire facet was  resected using the osteotomes as well as Kerrisons.  After this was then  completed, decompression carried out, a hockey stick nerve probe was  passed down both L4 neuroforamen and L5 neuroforamen and S1   neuroforamen.  Care was then taken to carefully obtain hemostasis using  thrombin-soaked Gelfoam pledgets where necessary.  Operating room  microscope was draped and brought into the field.  Under the operating  room microscope then an awl was placed through the entry point for the  expected.  Note that using the C-arm fluoroscopy then an awl was used to  make an entry point on the left side of the L5 level determining the  correct degree of lordosis with convergence and then a pedicle finder  was used to probe the pedicle on the left  side to 40 mm depth.  A 5.5  screw was chosen as the patient was felt to be small in stature.  Tapping with a 4.75 tap and then placing the screw after decorticating  the transverse process and applying local bone graft.  Next on the S1  level on the left side, similarly an awl was used to make an entry point  using C-arm fluoroscopy to verify the entry point and then using a  straight pedicle probe to probe the S1 channel.  Tapping with a 5.5 tap  and then placing a 6.25 screw x 30 mm on the left obtaining excellent  purchase here.  On the right side similarly then at the L5 level,  pedicle was probed following use of an awl for an entry point using C-  arm fluoroscopy to determine the correct entry point lordosis and then  after probing the pedicle with a straight pedicle probe, tapping with a  4.75 tap and then placing a 5.5 x 45 mm screw on the right side.  On the  right side at the S1 level similarly, awl used to make an entry point at  the inferior lateral aspect of the superior articular process of S1.  Then using a handheld pedicle finder, probing the pedicle and then  tapping with a 5.5 tap and then placing a 6.25 x 45 mm screw on the  right side at S1 obtaining excellent purchase here.  Each of the screws  were then monitored and tested for soft tissue resistance measured  greater than 40 and were felt to be in excellent shape.  There was no   intraoperative EMG or nerve conduction test changes during the case.  With this then, the thecal sac was retracted on the right side, the L5  nerve root retracted superiorly, entry made into the disk at the right  posterolateral corner using a 15 blade scalpel.  Bleeders were  controlled using bipolar electrocautery.  Pituitary rongeurs were used  to debride the disk space and then curettage carried out using the  straight curet and up-biting right and up-biting left curets.  Larger  pituitaries were then used to further debride the disk space and then a  ring curet was used to further debride the disk material and  cartilaginous endplate.  Sounding the disk space then sounding was  carried out to 9 mm.  A 9 mm trial provided an excellent fit.  Note that  with the disk space distracted then, the disk space was then packed with  additional local bone graft.  Further bone graft was placed within the 9  mm Concord lordotic cage.  Under C-arm fluoroscopy then the cage was  impacted into place about 35 degrees of angulation, obtaining excellent  purchase within the disk space and noted to be sat well within the  posterior aspect of the intervertebral disk space.  Intraoperative C-arm  fluoroscopy demonstrated this to be the case.  Hockey stick neuroprobe  could then be passed out each of the neuroforamen and there was no  residual bone graft draining within the spinal canal.  40 mm rods were  then placed in each of the screw head fasteners after these were  loosened using head-breakers.  Caps were then applied without  difficulty.  The caps were then tightened to 80 foot pounds at the L5  level bilaterally and then compression obtained between the L5 and S1  fasteners and the S1 cap then tightened to 80 foot pounds capturing  the  rod at the L5-S1 level on the right and left side.  This completed the  patient's fixation of the L5-S1 level and the additional remaining bone  graft was then placed  over the posterolateral region left and right  side.  Permanent C-arm images obtained in the AP and lateral planes  documenting the internal fixation, the placement of the patient's TLIF  in good position and alignment.  Following further irrigation then, the  patient had closure of the wound by placing a medium Hemovac drain at  the incision and exiting over the right lower lumbar spine.  Lumbodorsal  fascia reapproximated to the interspinous process ligaments and spinous  processes using interrupted #1 Vicryl sutures in simple figure-of-eight  fashion.  Lumbodorsal fascia itself was approximated in the midline with  interrupted figure-of-eight sutures and simple figure-of-eight sutures  of #1 Vicryl.  Paralumbar muscles were lightly approximated in the  midline prior to closure of the fascia avoiding drain.  The deep  subcutaneous layers were approximated with interrupted 0 Vicryl suture  and more superficial layers with interrupted 2-0 Vicryl sutures.  The  skin closed with a running subcuticular stitch of 4-0 Vicryl.  The  patient then had Dermabond applied.  Dry sterile and 4x4's affixed to  the skin with Hypafix tape.  The patient was then reactivated,  extubated, and returned to the recovery room in satisfactory condition.  All needle, sponge, and instrument counts correct.  Note that  intraoperative neuro monitoring demonstrated no significant  abnormalities in neuro function throughout the case.      Kerrin Champagne, M.D.  Electronically Signed     JEN/MEDQ  D:  03/05/2007  T:  03/06/2007  Job:  [id number]

## 2010-08-07 NOTE — Assessment & Plan Note (Signed)
[facility]                            CARDIOLOGY OFFICE NOTE   NAME:[name]                       MRN:          [id number]  DATE:10/23/2007                            DOB:          [birth year]    PRIMARY CARE PHYSICIAN:  Holley Bouche, MD with [facility].   NEPHROLOGIST:  Jorja Loa, MD   CLINICAL HISTORY:  [name] is 65 years old who returned for  management of coronary heart disease and congestive heart failure.  In  early 2008, she was hospitalized with heart failure and non-ST-elevation  myocardial infarction and renal insufficiency with creatinines in the  range of 4.  She underwent placement of non-overlapping drug-eluting  stents in the circumflex artery and had a chronic total occlusion of the  LAD.  He done fairly well since that time.  She got through back surgery  in December by Dr. Otelia Sergeant.  However, recently, she developed more  problems with fluid retention and had to go on dialysis about a month  ago which is being done in [place].  She says she has lost 25 pounds  on dialysis.  She had no chest pain, shortness breath or palpitations.   PAST MEDICAL HISTORY:  Significant for hypertension, hyperlipidemia and  diabetes.   CURRENT MEDICATIONS:  Plavix, aspirin, Norvasc, Lopressor, insulin and  simvastatin.   PHYSICAL EXAMINATION:  VITAL SIGNS:  Blood pressure 140/60 and pulse 59  and regular.  NECK:  There is no venous distension.  The carotid pulses were full  without bruits.  CHEST:  Clear.  HEART:  Rhythm was regular.  This was 2/6 systolic ejection murmur.  ABDOMEN:  Soft with normal bowel sounds.  There was no  hepatosplenomegaly.  This was 1+ peripheral edema.   IMPRESSION:  1. Coronary artery disease, status post percutaneous coronary      intervention of the circumflex artery with drug-eluting stent with      chronic total occlusion of the right coronary.  2. History of systolic and diastolic heart  failure.  3. Ejection fraction improved to 55% by last echocardiogram.  4. Chronic end-stage renal disease, now on chronic dialysis.  5. Hypertension.  6. Hyperlipidemia.  7. Type 2 diabetes.   RECOMMENDATIONS:  I think [name] is doing well despite the fact that  she recently had to go on dialysis.  Her cardiac situation appears  stable.  We will plan to see her back in followup in a year and we will  do an echocardiogram prior to that visit.     Bruce Elvera Lennox Juanda Chance, MD, [facility]  Electronically Signed    BRB/MedQ  DD: 10/23/2007  DT: 10/24/2007  Job #: [id number]

## 2010-08-07 NOTE — Assessment & Plan Note (Signed)
[facility]                            CARDIOLOGY OFFICE NOTE   NAME:[name], [name]                       MRN:          [id number]  DATE:02/24/2007                            DOB:          1945-04-25    PRIMARY CARE PHYSICIAN:  Holley Bouche, M.D. with [facility].   PRIMARY NEPHROLOGIST:  Jorja Loa, M.D. in [place].   ORTHOPEDIC SURGEON:  Kerrin Champagne, M.D.   CLINICAL HISTORY:  Ms. [name] is 65 years old and returned for follow up  management of her coronary heart disease and congestive heart failure  and for continued clearance for back surgery.  She was hospitalized  early this year with pulmonary edema and non-ST elevation myocardial  infarction and severe renal insufficiency with creatinines in the range  of 4.  She had tandem non-overlapping drug-eluding stents placed in the  circumflex artery and had a chronic total occlusion of the LAD.  She has  had a follow up Myoview scan which showed no ischemia and overall good  left ventricular function.   She is also followed by Dr. Kristian Covey for her renal insufficiency, and  her creatinines have improved.  She said last week her creatinine was  down to 2.1.   She has had no recent chest pain, shortness of breath or palpitations.   She is scheduled to come into the hospital on March 05, 2007 for  extensive lumbar back surgery by Dr. Otelia Sergeant.  She is here for a final  preoperative check and a decision about her Plavix therapy.   PAST MEDICAL HISTORY:  1. Hypertension.  2. Hyperlipidemia.  3. Type 2 diabetes.  4. Systolic heart failure.   CURRENT MEDICATIONS:  1. Plavix.  2. Norvasc.  3. Lopressor.  4. Lasix.  5. Novolin.  6. Aspirin.  7. Simvastatin.   PHYSICAL EXAMINATION:  VITAL SIGNS:  Blood pressure 150/70, pulse 56 and  regular.  NECK:  There was no venous distention.  The carotid pulses were full,  and there were no bruits.  CHEST:  Clear.  HEART:  Rhythm was regular.   There were no murmurs, rubs or gallops.  ABDOMEN:  Soft with normal bowel sounds.  There was no  hepatosplenomegaly.  EXTREMITIES:  Peripheral pulses were full and without peripheral edema.   ELECTROCARDIOGRAM:  Evidence of an old anterior infarction, left axis  deviation and nonspecific ST-T changes.   IMPRESSION:  1. Coronary artery disease status post prior placement of tandem non-      overlapping drug-eluding stents in the circumflex artery with      residual disease as described above.  2. History of congestive heart failure with systolic dysfunction.  3. Normal left ventricular function with an ejection fraction of 63%,      improved from a depressed function.  4. 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  5. Hypertension.  6. Hyperlipidemia.  7. Type 2 diabetes.  8. Lumbosacral spine disease.   RECOMMENDATIONS:  Ms. Seat has done amazingly well from the standpoint  of her heart and her kidney function.  She is 11 months out from her  drug-eluding stent, and I think it would be reasonable to stop her  Plavix now before the procedure, and I recommended that she stop it 6  days ahead of time.  I have recommended that she continue her aspirin  through the procedure.  She should also continue her beta blocker  through the procedure.  I will plan to see her when she comes in the  hospital for her surgery.  I would recommend that she go back on the  Plavix at least for the short-term after her surgery, although there is  no rush to do this, and we can do this as soon as Dr. Otelia Sergeant feels  comfortable about that.  I will plan to see her back in follow up here  in 4 months.     Bruce Elvera Lennox Juanda Chance, MD, [facility]  Electronically Signed    BRB/MedQ  DD: 02/24/2007  DT: 02/25/2007  Job #: [id number]

## 2010-08-07 NOTE — Consult Note (Signed)
[name], [name]                ACCOUNT NO.:  [id number]   MEDICAL RECORD NO.:  [id number]          PATIENT TYPE:  INP   LOCATION:  A309                          FACILITY:  [facility]   PHYSICIAN:  Barbaraann Barthel, M.D. DATE OF BIRTH:  04-26-1945   DATE OF CONSULTATION:  01/11/2007  DATE OF DISCHARGE:                                 CONSULTATION   Surgery was asked to see this 65 year old white female for a small-bowel  obstruction.   CHIEF COMPLAINT:  Bloating, nausea and vomiting.   HISTORY OF PRESENT MEDICAL ILLNESS:  The patient states that she has had  symptoms of bloating and nausea and vomiting in the past. She had this  first occur a month ago when she was down in [place], and this later  recurred this weekend.  When this occurred a month ago, this resolved on  its own after nonoperative treatment in [place], and she was readmitted  through the emergency room with similar symptoms again.  Clinically, the  patient's abdomen is soft at present, not distended.  Bowel sounds are  normoactive.  There are no incisional hernias, and there are no femoral  or inguinal hernias, and her stool is as guaiac negative.  Her acute  abdominal series shows some prominent small bowel gas with no free air.  As stated, since then she has had a large bowel movement and passed a  large amount of flatus, and clinically she feels much better without any  nausea or vomiting.  In my opinion, she does not need an NG tube at  present.   PHYSICAL EXAMINATION:  VITAL SIGNS:  Examination discloses a pleasant 34-  year-old white female who is approximately 5 feet 4 inches and weighs  140 pounds.  Her temperature is 98.5, blood pressure 146/71, heart rate  70 per minute, respirations 18.  Her O2 saturation is 99%.  HEENT:  Head is normocephalic.  Eyes:  Extraocular movements intact.  Pupils were round and react to light and accommodation.  There is some  mild conjunctival pallor. There is no scleral  injection.  The sclerae  otherwise are normal tincture.  Nose and oral mucosa within normal  limits.  The patient has dental prosthesis.  NECK:  Supple and cylindrical without jugular vein distention,  thyromegaly or tracheal deviation.  No bruits are auscultated, and there  is no cervical adenopathy.  CHEST:  Clear both anterior and posterior auscultation.  HEART:  Regular rhythm.  BREASTS AND AXILLA:  Without masses.  ABDOMEN:  As stated, the patient has a midline incision consistent with  her previous surgery.  There is no incisional hernia present and no  femoral or inguinal hernias present.  RECTAL:  Guaiac-negative stool.  EXTREMITIES:  The patient has an AV fistula in her right arm. Otherwise,  extremities are grossly within normal limits.   REVIEW OF SYSTEMS:  GI SYSTEM:  Nausea, vomiting and bloating on and off  for the last month or so. This has caused partial small-bowel  obstructions likely secondary to her previous radical hysterectomy  performed by Dr. Stanford Breed in 1995. At that  time she was treated  with radiation as well as chemotherapy and radical surgery.  OB/GYN  HISTORY:  She is a gravida 3, para 3, abortus zero, cesarean zero female who has a  sister who has carcinoma of the breast.  She had a mammogram earlier  this year in June which was normal according to her. She also had a CT  scan back in June as well which showed no changes, worrisome for  recurrence, and she also states that she had a CA-125 level drawn at  that time which was not elevated according to her.  CARDIORESPIRATORY  SYSTEM:  The patient has a past history of myocardial infarction.  She  has a history of hypertension.  She is a nonsmoker at present, although  she has smoked in the past.  She takes Norvasc for hypertension.  ENDOCRINE SYSTEM:  She is an insulin-dependent diabetic.  She also has  an AV fistula placed. This has not been used at present.  She has no  past history of thyroid  disease.  MUSCULOSKELETAL SYSTEM:  The patient  has some problems with disks that cause her considerable chronic back  pain, and she is considering disk surgery at present. She takes Vicodin  for a chronic back pain.  I believe this also as well aggravates her  bowel problems along with not drinking enough fluid and not taking stool  softeners. SOCIOECONOMIC:  The patient is a retired Engineer, site.   MEDICATIONS:  1. Norvasc 10 mg of once daily.  2. Lasix 40 mg b.i.d.  3. Novolin 70/30, 30 units.  4. Percocet 5/325.  5. Xanax 1 mg.  6. Plavix 75 mg daily.  7. Tricor 40 mg daily.   ALLERGIES:  No known allergies.   LABORATORY DATA:  Acute abdominal series, as mentioned above, showed  prominent small-bowel loops.  No free air.   White count 10.4 with hemoglobin of 11.8 and hematocrit of  33.8,  platelet count 314,000 with 83% neutrophils noted.  MET-7 shows sodium  135, potassium 4.2, glucose 155, BUN 67, creatinine of 4.48.  Calcium  8.2.   IMPRESSION:  Partial small-bowel obstruction, likely secondary to  surgical adhesions. This patient clinically is stable.  She has had a  large bowel movement and passed a lot of flatus, and this problem has  resolved clinically.   SUGGESTIONS:  Follow-up with Dr. Nicholas Lose and Dr. Stanford Breed.  We will  repeat the acute abdominal series in the morning. I have returned her to  clear liquids and suggest stool softeners, ambulation and limiting  narcotics if possible.  I will follow clinically with the medical  service.      Barbaraann Barthel, M.D.  Electronically Signed     WB/MEDQ  D:  01/11/2007  T:  01/11/2007  Job:  [id number]   cc:   Gretta Cool, M.D.  Fax: [phone]   De Blanch, M.D.  [address]  [place]  [place] [address]   [facility]

## 2010-08-07 NOTE — Discharge Summary (Signed)
[name], [name]                ACCOUNT NO.:  [id number]   MEDICAL RECORD NO.:  [id number]          PATIENT TYPE:  INP   LOCATION:  5024                         FACILITY:  [facility]   PHYSICIAN:  Kerrin Champagne, M.D.   DATE OF BIRTH:  1945/04/10   DATE OF ADMISSION:  03/05/2007  DATE OF DISCHARGE:  03/10/2007                               DISCHARGE SUMMARY   ADMISSION DIAGNOSES:  1. Spondylolisthesis L5-S1 with bilateral spinal stenosis, right      greater than left.  2. Type 2 diabetes mellitus.  3. Hypertension.  4. Hyperlipidemia.  5. Coronary artery disease status post myocardial infarction and stent      placement.  6. History of congestive heart failure.  7. 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  8. History of ovarian cancer status post hysterectomy and      oophorectomy.   DISCHARGE DIAGNOSES:  1. Spondylolisthesis L5-S1 with bilateral stenosis, right greater than      left, with severe right L5 foraminal entrapment, bilateral lateral      recess stenosis affecting the S1 nerve root, moderate left neural      foraminal stenosis.  2. Type 2 diabetes mellitus.  3. Hypertension.  4. Hyperlipidemia.  5. Coronary artery disease status post myocardial infarction and stent      placement.  6. History of congestive heart failure.  7. 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  8. History of ovarian cancer status post hysterectomy and      oophorectomy.  9. Acute on chronic renal failure.  10.Posthemorrhagic anemia requiring blood transfusion.  11.Urinary tract infection.   PROCEDURE:  On March 05, 2007, the patient underwent central  decompressive laminectomy L5-S1 with decompression of bilateral L5 and  bilateral S1 nerve roots.  Decompression carried to a L3-4 level.  Right  transforaminal lumbar interbody fusion with Concorde lordotic cage and  local bone graft.  Posterolateral fusion L5-S1 with local bone graft.  Pedicle  screw and rod fixation L5-S1.  This was performed by Dr. Otelia Sergeant,  assisted by Maud Deed, [facility], under general anesthesia.   CONSULTATIONS:  Dr. [name] of [facility].   BRIEF HISTORY:  The patient is a 65 year old female who had a back  injury approximately two years prior to this admission.  She has had  severe back pain with radiation into the right, greater than left, leg  with neurogenic claudication symptoms.  Studies have indicated grade 1  spondylolisthesis at L5-S1.  It was felt she would benefit from surgical  intervention and was admitted for the procedure as stated above, after  she had been seen medically and cleared from a medical standpoint for  the procedure.   BRIEF HOSPITAL COURSE:  The patient tolerated the procedure under  general anesthesia without complications.  Postoperatively,  neurovascular motor function of the lower extremities was noted to be  intact.  She was started on the usual physical therapy protocol for  ambulation and gait training.  She was fitted with an Therapist, nutritional through  by [employer].  This was utilized when the patient was out of  bed.  She was  seen by occupational therapy for ADLs.  Prior to discharge, she was  independent with ADLs.  From a physical therapy standpoint, she was  ambulating well, utilizing a rolling walker.  She was ambulating more  than 100 feet at the time of discharge.  Dressing change was done daily.  Hemovac drain discontinued on the first postoperative day.  The patient  was monitored closely for postoperative ileus.  Diet was gradually  progressed to a low carbohydrate diet once she had bowel sounds and  bowel function returned.  Medical consultation was obtained and Dr.  [name] assisted with her medical care during the hospital stay.  She  was treated for acute on chronic renal insufficiency.  She developed  leukocytosis and was found to have a urinary tract infection which was  treated with Cipro.  She did  develop posthemorrhagic anemia with  hemoglobin and hematocrit to the lowest value of 8.2, requiring 2 units  of packed red blood cells.  At the time of discharge, she had resumed  her home medications as taken prior to admission.  She was stable for  discharge to home.   PERTINENT LABORATORY VALUES:  CBC on admission with WBC 6.2, hemoglobin  11.7, hematocrit 33.1.  Hemoglobin did drop to 7.6 postoperatively with  hematocrit 21.6.  She received 2 units of packed red blood cells and  returned to a value 8.7 and hematocrit 25.2.  Hemoglobin dropped once  again to 80.2 with hematocrit 23.5 and she received an additional unit  of packed red blood cells.  At discharge hemoglobin 9.9, hematocrit  28.5.  WBC elevated to 19.4 on March 07, 2007.  At discharge, WBC 8.1  after treatment of urinary tract infection with Cipro.  Urinalysis on  March 07, 2007, with 21-50 WBCs, 3-6 RBCs, many bacteria, mucus  present.  Urine culture with final results on March 08, 2007, showed  no growth.  Hemoglobin A1c on admission 6.6.  Chemistry studies on  admission with glucose 50, BUN 77, creatinine 3.05.  Creatinine ranged  to the lowest value of 2.78 and highest value of 3.8 during the hospital  stay.  BUN ranging from the 77-59.  On admission, GFR was 15.  At  discharge, value was 14.  Phosphorus value noted to be high on March 07, 2007, at 5.  Calcium values 7.8 on admission and 8.3 at discharge.  Urine sodium and urine creatinine on March 07, 2007, within normal  limits.   EKG on admission showed normal sinus rhythm, left axis deviation,  anteroseptal infarct age undetermined.  This was confirmed by Dr.  Ignacia Palma.   PLAN:  The patient was discharged to home.  Instructions given for  continuation of her home medications as taken prior to admission.  She  was given prescriptions for  1. Vicodin one to two q.4-6h. as needed for pain.  2. Robaxin 500 mg one every 8 hours as needed for  spasm.  3. Cipro to have 50 mg one p.o. q.12h.  4. She will continue to use over-the-counter stool softeners daily and      laxative of choice as needed.   The patient will continue to ambulate with her walker.  She is  instructed to wear her brace anytime she is out of bed.  She may shower  at home and dressing can be changed daily.  She will resume a diabetic  diet.  The patient to follow  up with Dr. Otelia Sergeant in  the office two weeks from the date of surgery.  She  will follow up with her renal physician as indicated.  All questions  encouraged and answered prior to discharge.   CONDITION ON DISCHARGE:  Stable.      Wende Neighbors, P.A.      Kerrin Champagne, M.D.  Electronically Signed    SMV/MEDQ  D:  04/30/2007  T:  05/01/2007  Job:  [id number]

## 2010-08-07 NOTE — Consult Note (Signed)
NAME:  [name], [name]                ACCOUNT NO.:  [id number]   MEDICAL RECORD NO.:  [id number]          PATIENT TYPE:  INP   LOCATION:  A309                          FACILITY:  [facility]   PHYSICIAN:  Jorja Loa, M.D.DATE OF BIRTH:  1945/12/09   DATE OF CONSULTATION:  01/11/2007  DATE OF DISCHARGE:                                 CONSULTATION   [name] is a 65 year old Caucasian female with multiple past medical  history including history of hypertension, history of diabetes, history  of chronic renal failure, stage 4, and history of congenitally absent  left kidney.  She presently came with severe abdominal pain.  [name]  denies any nausea or vomiting.  Her appetite is reasonable.  She does  not have any diarrhea.  According to her, she has been admitted  previously also to hospital when she was in [place] because of severe  left lower abdominal pain.  She denies any fever, chills, or sweating.   PAST MEDICAL HISTORY:  1. History of type 2 diabetes.  2. History of anemia.  3. History of chronic renal failure, stage 4.  4. History of congenitally absent left kidney.  5. History of vertebral disk prolapse.  6. History of coronary artery disease.  7. History of syncope and bradycardia.  8. History of ovarian CA status post C-section.  9. Hyperphosphatemia.  10.Anemia of chronic disease.   MEDICATIONS:  1. Xanax 1 mg p.o. daily.  2. Norvasc 10 mg p.o. daily.  3. Plavix 75 mg p.o. daily.  4. Lovenox 30 mg subcu daily.  5. Tricor 48 mg p.o. daily.  6. Lasix 40 mg p.o. daily.  7. Insulin.  8. Protonix 40 mg p.o. daily.  9. IV fluids with half-normal saline at 80 mL per hour.  10.Dilantin on p.r.n. basis.   ALLERGIES:  NO KNOWN DRUG ALLERGIES.   SOCIAL HISTORY:  No history of smoking.  No history of alcohol abuse.   REVIEW OF SYSTEMS:  Her main complaint seems to be abdominal pain, left-  sided, continuous, but she does not have any nausea or vomiting.  She  does not  have any diarrhea.  The patient also denies any fever, chills,  or sweating.   PHYSICAL EXAMINATION:  GENERAL:  The patient is at this moment alert and  in no apparent distress.  She states that she is overall feeling better.  VITAL SIGNS:  Temperature 98.5, pulse 70, blood pressure 146/71.  CHEST:  Clear to auscultation.  HEART:  Regular rate and rhythm.  No murmur.  ABDOMEN:  Soft, positive bowel sounds.  EXTREMITIES:  No edema.   LABORATORY DATA:  Her blood work today revealed a white blood cell count  of 10.4, yesterday it was 13; hemoglobin 11.8, hematocrit 33.8, and  platelets of 314.  Her sodium is 135, potassium 4.2, BUN is 67,  creatinine 4.48, and her GFR is 10 mL per minute.  Her LFTs are normal.  Her albumin is 3.29, calcium of 8.2, lipase his 69.   There is an x-ray which was done December 31, 2006, about a week ago,  which showed bowel gas pattern suggestive of partial bowel obstruction.   ASSESSMENT:  1. Renal insufficiency, stage 4 reaching end-stage.  At this moment,      she is asymptomatic.  Her BUN and creatinine seems to be still      stable.  She has a fistula on her left arm.  Her potassium is okay,      and BUN and creatinine are stable.  2. Anemia secondary to chronic renal failure.  She is on Epogen as an      outpatient.  Her H&H is stable.  3. History of hyperphosphatemia.  The patient was put on phosphorus      binder, but according to her, because of severe constipation,  she      is not taking it.  4. History of abdominal pain.  The patient has elevated lipase,      probably we may be dealing with pancreatitis.  5. History of diabetes.  She is on insulin at this moment.  6. History of coronary artery disease.  She is status post stent      placement.   RECOMMENDATIONS:  I will continue her present management.  Will check  her PTH and also phosphorus and continue her other medications.  We will  follow the patient as an outpatient when she is  discharged.  Since her  H&H has been reasonably high, the Epogen was withheld.  Once her  hemoglobin and hematocrit go down, possibly with hemoglobin of below 10,  we will resume Epogen.      Jorja Loa, M.D.  Electronically Signed     BB/MEDQ  D:  01/11/2007  T:  01/11/2007  Job:  [id number]

## 2010-08-07 NOTE — Discharge Summary (Signed)
NAME:  [name], [name]                ACCOUNT NO.:  [id number]   MEDICAL RECORD NO.:  [id number]          PATIENT TYPE:  INP   LOCATION:  A309                          FACILITY:  [facility]   PHYSICIAN:  Dorris Singh, DO    DATE OF BIRTH:  April 05, 1945   DATE OF ADMISSION:  01/11/2007  DATE OF DISCHARGE:  10/20/2008LH                               DISCHARGE SUMMARY   ADMISSION DIAGNOSES:  Includes abdominal pain, small-bowel obstruction,  and renal failure.   DISCHARGE DIAGNOSIS:  Include small bowel obstruction and renal failure  and diabetes.   PRIMARY CARE PHYSICIAN:  Her primary care is Dr. Tiburcio Pea.   CONSULTS:  Dr. Kristian Covey and surgery, Dr. Malvin Johns were consulted.   COURSE:  Please refer to H&P done by Dr. Lilian Kapur.   BRIEF SUMMARY:  Is that the patient is a 65 year old female who  presented with abdominal pain that started 3 days ago.  On examination  and x-ray, she was found to have a small bowel obstruction.  At that  point in time she was admitted to the service of [facility] and made  n.p.o. Surgery was consulted to see her, as well as Dr. Kristian Covey, due to  her renal insufficiency.  She was put on DVT and GI prophylaxis. While  here, she continued to progress with decreasing abdominal pain.  She  also progressed with a clear liquid diet, without any issue. She  continued to progress overnight without any abdominal pain and had a big  bowel movement, which seemed to relieve it.  She was seen by Dr.  Kristian Covey, and recommended that she see her outpatient, as well.  On  October 20, the patient felt well enough to go home. At this point in  time, it was determined she was medically stable to do so.  Her  disposition is good.  Her condition is stable.   DISCHARGE MEDICATIONS:  She will be sent home on the following  medications which include:  1. Norvasc 10 mg daily.  2. Lasix 40 mg b.i.d.  3. Novolin 70/30, 30 units in the a.m.  4. Tricor 40 mg once a day.  5. Percocet 5/325 as  needed.  6. Xanax 1 mg as needed.  7. Plavix 75 mg once a day.   INSTRUCTIONS:  Her instructions are to follow up with Dr. Tiburcio Pea in 1 to  3 days, also to follow up with Dr. Kristian Covey or a nephrologist of her  choice or Dr. Johnathan Hausen choice in 1 to 2 weeks.      Dorris Singh, DO  Electronically Signed     CB/MEDQ  D:  01/12/2007  T:  01/12/2007  Job:  [id number]

## 2010-08-07 NOTE — Assessment & Plan Note (Signed)
[facility]                            CARDIOLOGY OFFICE NOTE   NAME:[name], [name]                       MRN:          [id number]  DATE:06/25/2007                            DOB:          05-Jun-1945    PRIMARY CARE PHYSICIAN:  Holley Bouche with [facility].   NEPHROLOGIST:  Dr. Kristian Covey in [place].   CLINICAL HISTORY:  Ms. Woulfe is a 65 years old and returns for  management of her coronary heart disease and congestive heart failure.  She was hospitalized in early 2008 with a non-ST-elevation myocardial  infarction, pulmonary edema, and renal insufficiency with creatinines in  the range of 4.  She underwent placement of a non-overlapping drug-  eluting stents in the circumflex artery and had a chronically occluded  LAD.  Her heart failure improved with treatment.  Her renal function  also improved with treatment.  She had a Myoview scan last summer which  showed no evidence of ischemia.   I saw her in consult preoperatively before back surgery by Dr. Otelia Sergeant in  December.  She did well with surgery and says she has felt well since  that time.  Her back is much improved, and she has had no chest pain,  shortness breath or palpitations.  She has had some edema of the lower  extremities, and Dr. Tiburcio Pea has recently increased her Lasix from 80 a  day to 80 in the morning and 40 in the afternoon.   PAST MEDICAL HISTORY:  1. Hypertension.  2. Hyperlipidemia.  3. Diabetes.   CURRENT MEDICATIONS:  Plavix, Norvasc, Novolin, aspirin, simvastatin,  and Lasix 80 mg in the morning and 40 mg in the afternoon.   EXAMINATION:  The blood pressure was 149/65 and pulse 59 and regular.  There was no venous tension.  The carotid pulses were full without  bruits.  The jugular venous pulse was visible 2 cm above the clavicle.  CHEST:  Clear.  CARDIAC:  Rhythm was regular.  I heard no murmurs or gallops.  The  cardiac rhythm was regular.  There is a short systolic  murmur at the  left sternal edge.  ABDOMEN:  Soft with normal bowel sounds.  There is no  hepatosplenomegaly.  There is 1+ peripheral edema, and pedal pulses are equal.   IMPRESSION:  1. Diastolic heart failure with mild volume overload.  2. Coronary artery disease status post PCI of the circumflex artery      with drug-eluting stent in 2008 with chronic total occlusion left      anterior descending.  3. Left ventricular dysfunction improved to a normal ejection fraction      of 63% last Myoview.  4. Hypertension.  5. Hyperlipidemia.  6. Type 2 diabetes.  7. Chronic renal insufficiency.   RECOMMENDATIONS:  Ms. Boye chest appears to be doing well.  She does  have some mild volume overload, but we were reluctant to push her  diuretics further due to her renal insufficiency.  We will get a BMP,  BNP, CBC today.  We will also get a 2-D echocardiogram to reevaluate her  left ventricular function and right ventricular function and pulmonary  artery pressure.  I will plan to see her back in 4 months or sooner if  necessary based on the results of her tests.  She is scheduled to see  Dr. Kristian Covey in about 2 weeks.     Bruce Elvera Lennox Juanda Chance, MD, [facility]  Electronically Signed    BRB/MedQ  DD: 06/25/2007  DT: 06/25/2007  Job #: [id number]

## 2010-08-07 NOTE — Assessment & Plan Note (Signed)
[facility]                            CARDIOLOGY OFFICE NOTE   NAME:[name], [name]                       MRN:          [id number]  DATE:11/04/2006                            DOB:          May 06, 1945    PRIMARY CARE PHYSICIAN:  Dr. Holley Bouche.   NEPHROLOGIST:  Jorja Loa in [place].   CLINICAL HISTORY:  [name] is 65 years old and was recently  hospitalized with chest pain, pulmonary edema, and a non-ST elevation  infarction at [facility] and subsequently transferred to [facility].  She had acute and chronic renal insufficiency, and underwent  catheterization and then a staged angioplasty of the circumflex artery  with residual total occlusion of the left anterior descending artery.   She has done amazingly well since that time.  She has had no recent  chest pain, shortness of breath, or palpitations.  Her creatinines have  been in the 5-6 range, and she says they have been stable.  She says she  has had no fluid accumulation.   PAST MEDICAL HISTORY:  Significant for absence of one kidney on a  congenital basis.  She also has a history of hypertension and  hyperlipidemia.   CURRENT MEDICATIONS:  1. Plavix.  2. Norvasc.  3. Lopressor.  4. Lasix.  5. Insulin.  6. Aspirin.  7. Simvastatin.   PHYSICAL EXAMINATION:  The blood pressure was 142/70, pulse 74 and  regular.  There was no venous distension.  The carotid pulses were full without  bruits.  CHEST:  Clear.  HEART:  Rhythm was regular, I could hear no murmurs or gallops.  ABDOMEN:  Soft without organomegaly.  Peripheral pulses were full.  There is no peripheral edema.   She had a recent Myoview scan which, amazingly, showed no evidence of  ischemia and an ejection fraction of 63%.  She did have some decreased  uptake, she did have a scar in the lateral wall and apex.   IMPRESSION:  1. Coronary artery disease, status post recent non-ST elevation      myocardial  infarction and status post drug-eluting stent to the      circumflex marginal vessel with residual total occlusion of the      left anterior descending with no evidence of ischemia on recent      Myoview scan.  2. Good left ventricular function with ejection fraction of 65% by      Myoview scan.  3. History of congestive heart failure related to systolic function,      now compensated.  4. Chronic renal insufficiency, creatinines in the range of 4-6.  5. Hypertension.  6. Hyperlipidemia.  7. Congenitally absent kidney.   RECOMMENDATIONS:  Ms. Erck has done amazingly well considering the  severity of her renal insufficiency and other problems.  We will not  make any changes in her medications today.  I will plan to see her back  in followup in 4 months.     Bruce Elvera Lennox Juanda Chance, MD, [facility]  Electronically Signed    BRB/MedQ  DD: 11/04/2006  DT: 11/05/2006  Job #:  [id number] 

## 2010-08-07 NOTE — H&P (Signed)
NAME:  [name], [name]                ACCOUNT NO.:  [id number]   MEDICAL RECORD NO.:  [id number]          PATIENT TYPE:  INP   LOCATION:  A309                          FACILITY:  [facility]   PHYSICIAN:  Dorris Singh, DO    DATE OF BIRTH:  Jan 25, 1946   DATE OF ADMISSION:  01/11/2007  DATE OF DISCHARGE:  LH                              HISTORY & PHYSICAL   The patient is a 65 year old female who presented to the [facility]  emergency room with complaints of abdominal pain.  She stated that this  started 3 days ago, and she has had a consistent persistent pattern. The  patient was seen in the emergency room on October 18 as well complaining  of the same thing and was sent home but did not get any better.  She did  have any other symptoms. On examination, she was found to have an  admitting diagnosis of abdominal pain and possible small-bowel  obstruction.   PAST MEDICAL HISTORY:  Is significant for:  1. Hypertension.  2. Diabetes.  3. Hypertriglyceridemia.  4. Myocardial infarction.  5. Renal insufficiency.  6. Recurrent abdominal pain.   FAMILY HISTORY:  Significant for CAD and hypertension.   She has had a hysterectomy and has only one unilateral kidney.   SOCIAL HISTORY:  She is nonsmoker, nondrinker.  No drug abuse, illicit.   ALLERGIES:  She has no known drug allergies.   CURRENT MEDICATION:  1. Norvasc 10 mg once a day.  2. Lasix 40 mg twice a day.  3. NovoLog 70/30 subcutaneously, 30 units in the morning and 10 units      in the evening.  4. Tricor 40 mg once a day.  5. Percocet 5/325 as needed.  6. Xanax 1 mg as needed.  7. Plavix 75 mg once a day.   REVIEW OF SYSTEMS:  Pertinent positives are nausea and vomiting and  abdominal pain.  There is no chest pain or shortness of breath or  changes in vision, changes in urination or changes in musculoskeletal  issues, and no issues for any rash.   PHYSICAL EXAMINATION:  VITAL SIGNS:  Temperature 98.5, pulse 70,  respirations 18, blood pressure 146/71.  GENERAL:  This is a well-developed, well-nourished female who was seen  resting comfortably in bed.  HEAD:  Normocephalic, atraumatic.  EYES: PERRLA.  EOMI.  No scleral icterus or conjunctival discharge.  NOSE:  Symmetrical.  MOUTH:  Dentition is good.  No erythema or exudate.  NECK:  Supple.  Full range of motion.  HEART:  Regular rate and rhythm.  No murmurs noted.  LUNGS:  Clear to auscultation bilaterally.  No rales, wheezes or  rhonchi.  ABDOMEN:  Soft, nondistended and nontender.  EXTREMITIES:  Positive pulses.  No ecchymosis, cyanosis or edema noted.   Her labs are as follows, sodium 135, potassium 4.2, chloride 103, carbon  dioxide 26, glucose 155, BUN 67, creatinine 4.48.  Her white count is  10.4, hemoglobin 11.8, hematocrit 33.8, and platelet count of 3.14.   Acute abdominal series showed small bowel without air-fluid levels or  free  air with dilation of the small bowel.   Adding to her admitting diagnoses, we have:  1. Abdominal pain.  2. Small-bowel obstruction.  3. Renal failure.   PLAN:  Will admit the patient to the service of [facility]. Will make her  n.p.o. and have surgery see her. Also will put the patient on stool  softeners as well and DVT and GI prophylaxes. Will have Dr. Kristian Covey  come see the patient regarding renal insufficiency for any other  recommendations that he may have and will continue to see how she  improves and continue to monitor.      Dorris Singh, DO  Electronically Signed     CB/MEDQ  D:  01/11/2007  T:  01/11/2007  Job:  [id number]

## 2010-08-07 NOTE — Consult Note (Signed)
NAME:  [name], [name]                ACCOUNT NO.:  [id number]   MEDICAL RECORD NO.:  [id number]          PATIENT TYPE:  INP   LOCATION:  5024                         FACILITY:  [facility]   PHYSICIAN:  Ramiro Harvest, MD    DATE OF BIRTH:  1946/03/22   DATE OF CONSULTATION:  DATE OF DISCHARGE:                                 CONSULTATION   REASON FOR CONSULTATION:  Management of the patient's chronic medical  issues.   PRIMARY CARE PHYSICIAN:  Holley Bouche, M.D.   HISTORY OF PRESENT ILLNESS:  [name] is a 65 year old white female  with a history of chronic renal insufficiency, coronary artery disease  status post non-ST elevated MI in February 2008, type 2 diabetes, last  hemoglobin A1c was 6.3, hypertension, hyperlipidemia, who injured her  back at work about 1.5 to 2 years ago and has been having severe back  pain with radiation to the right lower extremity greater than left lower  extremity with associated neurogenic claudication.  The patient had  studies which were done which showed the patient had spondylolisthesis  of L5-S1 with bilateral stenosis, right greater than left.  The patient  was admitted for surgical decompression.  We were asked to consult on  the patient for management of her chronic medical issues.   ALLERGIES:  No known drug allergies.   PAST MEDICAL HISTORY:  1. Hypertension.  2. Type 2 diabetes.  3. Hyperlipidemia.  4. Coronary artery disease, status post non-ST elevated MI, status      post DES x2 to the circumflex in February 2008.  5. History of systolic heart failure last year from 63% improved from      a depressed function.  6. 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      Creatinine a week ago was 2.  7. Small bowel obstruction, January 11, 2007.  8. History of ovarian cancer in 1995, status post hysterectomy.  9. Spondylolisthesis of L5-S1 with bilateral stenosis, right greater      than left.   HOME MEDICATIONS:  1.  Plavix 75 mg daily.  2. Norvasc 10 mg daily.  3. Lopressor 100 mg b.i.d.  4. Lasix 40 mg b.i.d.  5. Aspirin 81 mg daily.  6. Simvastatin 40 mg daily.  7. Novolin 70/30, 30 units in the morning and 10 units at night.  8. Fibercon pills.   HOSPITAL MEDICATIONS:  1. Norvasc 10 mg daily.  2. Fibercon 625 mg daily.  3. Plavix 75 mg daily.  4. Colace 100 mg b.i.d.  5. Lasix 40 mg b.i.d.  6. Robaxin 500 mg IV q.8 h.  7. Reglan 10 mg IV q.8 h.  8. Lopressor 100 mg b.i.d.  9. Morphine sulfate PCA pump.   FAMILY HISTORY:  The patient's mother deceased age 52 from complications  of diabetes.  Father deceased at 74 from Hodgkin's disease and  emphysema.  The patient has one sister with breast cancer.  Brother died  from lung cancer.  One sister who died from breast cancer.  Family  history also significant for diabetes, hypertension, and coronary artery  disease.   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  She denies any tobacco abuse, no alcohol abuse, no IV drug  use.   REVIEW OF SYSTEMS:  As per HPI, otherwise negative.   PHYSICAL EXAMINATION:  VITAL SIGNS:  Temperature 98.8, pulse of 73,  blood pressure 137/66, respiratory rate 18, sat 99% on 2 liters nasal  cannula.  CBG range from 185 through 249.  GENERAL:  The patient is in bed with her family at the bedside.  HEENT:  Normocephalic atraumatic.  Pupils are equal, round, and reactive  to light.  Extraocular movements intact.  Oropharynx is clear, moist, no  lesions, and no exudates.  NECK:  Supple.  No lymphadenopathy.  RESPIRATORY:  Lungs are clear to auscultation bilaterally in the  anterior lung fields.  No wheezes.  No rhonchi.  CARDIOVASCULAR:  Regular rate and rhythm.  No murmurs, rubs, or gallops.  ABDOMEN:  Soft, nontender, nondistended.  Positive bowel sounds.  EXTREMITIES:  No clubbing, cyanosis, or  edema.  NEUROLOGIC:  The patient is alert and oriented x3.  Cranial nerves II-  XII are grossly intact.  No focal deficits.  Cerebellum is intact.  Sensation is intact.  Gait not tested.   LABORATORY:  Hemoglobin A1c 6.3.  B-MET:  Sodium 138, potassium 4.2,  chloride 111, bicarb 18, BUN 64, creatinine 3.16, glucose of 217, and a  calcium of 7.9.  CBC:  H&H hemoglobin 8.7 and hematocrit 25.2.   ASSESSMENT/PLAN:  Ms. [name] is a 65 year old female with a  history of hypertension, diabetes, hyperlipidemia, coronary artery  disease status post non-ST elevated myocardial infarction who presented  for surgical decompression of spondylolisthesis of L5-S1 with bilateral  stenoses right greater than left.   PROBLEMS:  1. Diabetes mellitus, type 2.  Hemoglobin A1c of 6.3.  The patient is      currently on clear liquids.  We will check CBG every four hours.      We will start a moderate sliding scale insulin with no bedtime      coverage.  Once the patient starts tolerating oral intake and her      diet starts to advance, we will restart the patient on half her      home dose of her NovoLog and use sliding scale and titrate      accordingly.  2. Hypertension.  We will continue current regimen with Norvasc and      Lopressor and Lasix.  3. Hyperlipidemia.  We will restart her home dose of simvastatin 40 mg      daily.  4. Spondylolisthesis L5-S1 with bilateral stenoses, right greater than      left, status post central decompressive laminectomy, L5-S1 with      decompression of bilateral L5 and bilateral S1 nerve roots per      primary team.  5. Chronic renal insufficiency pre renal.  6. Coronary artery disease, status post non-ST elevated myocardial      infarction, status post drug-eluting stent x2.  Continue Lasix and      Lopressor and Norvasc.  We will start simvastatin 40 mg daily.      Continue Plavix and per cardiology.  7. Anemia secondary to postoperative blood loss.  The  patient is      status post transfusion of 1 unit packed red blood cells.  Follow  the patient's H&H and per primary team.  8. Prophylaxis.  Protonix for gastrointestinal prophylaxis and we will      defer deep vein thrombosis prophylaxis to her primary team.   It has been a pleasure consulting on Ms. Ayen Melecio.      Ramiro Harvest, MD  Electronically Signed     DT/MEDQ  D:  03/06/2007  T:  03/08/2007  Job:  [id number]

## 2010-08-10 NOTE — Op Note (Signed)
NAME:  Medero, Ashly                ACCOUNT NO.:  [id number]   MEDICAL RECORD NO.:  [id number]          PATIENT TYPE:  INP   LOCATION:  6523                         FACILITY:  [facility]   PHYSICIAN:  Larina Earthly, M.D.    DATE OF BIRTH:  1945/10/06   DATE OF PROCEDURE:  05/16/2006  DATE OF DISCHARGE:  05/22/2006                               OPERATIVE REPORT   PREOPERATIVE DIAGNOSIS:  End-stage renal disease.   POSTOPERATIVE DIAGNOSIS:  End-stage renal disease.   PROCEDURES:  1. Right internal jugular Diatek catheter with ultrasound      visualization.  2. Right arteriovenous fistula creation.   SURGEON:  Larina Earthly, M.D.   ASSISTANT:  Nurse.   ANESTHESIA:  MAC.   COMPLICATIONS:  None.   DISPOSITION:  To recovery room, stable.   PROCEDURES IN DETAIL:  The patient was taken to the operating room and  placed in supine position, where the areas of the right arm and right  axilla were prepped and draped in the usual sterile fashion.  Using  local anesthesia, an incision was made to the level of the cephalic vein  and brachial artery at the antecubital space.  The vein was mobilized  proximally and distally and was divided distally and brought to the  level of the brachial artery.  The brachial artery was occluded  proximally and distally and was opened with an 11 blade and extended  longitudinally with Potts scissors.  The vein was cut to the appropriate  length, spatulated and sewn end to side to the artery with running 6-0  Prolene suture.  Clamps were removed and an excellent thrill was noted.  The wounds were irrigated with saline.  Hemostasis with electrocautery.  The wounds were closed with 3-0 Vicryl in the subcutaneous, subcuticular  tissue.  Benzoin and Steri-Strips were applied.  Next, the right and  left neck and chest were prepped and draped in the usual sterile  fashion.  Ultrasound was used to visualize patent jugular veins  bilaterally.  The patient was placed  in Trendelenburg position, and  using local anesthesia and a finder needle, the right internal jugular  vein was identified.  Next, using the Seldinger technique, the guide  wire was directed down to the level of the right atrium.  A dilator and  peel-away sheath were passed over the guide wire.  The dilator and guide  wire were removed, and a 28-cm Diatek catheter was positioned through  the peel-away sheath, which, then, was removed.  The catheter was  brought through the subcutaneous tunnel and the 2-lumen ports were  attached.  Both lumens flushed and  aspirated easily, and were locked with 1000 units per mL heparin.  The  catheter was secured to the skin with a 3-0 nylon stitch.  The entrance  site was closed with a 4-0 subcuticular Vicryl stitch.  A sterile  dressing was applied and the patient was taken to the recovery room in  stable condition.      Larina Earthly, M.D.  Electronically Signed     TFE/MEDQ  D:  05/22/2006  T:  05/22/2006  Job:  [id number]

## 2010-08-10 NOTE — Consult Note (Signed)
[name], BOULLION                ACCOUNT NO.:  [id number]   MEDICAL RECORD NO.:  [id number]          PATIENT TYPE:  INP   LOCATION:  4733                         FACILITY:  [facility]   PHYSICIAN:  Lyn Records, M.D.   DATE OF BIRTH:  12/27/1945   DATE OF CONSULTATION:  04/08/2006  DATE OF DISCHARGE:                                 CONSULTATION   REASON FOR CONSULTATION:  Elevated cardiac markers.   CONCLUSIONS:  1. Syncope, etiology uncertain.  I think it is possibly multifactorial      related to severe hyperglycemia.  I cannot rule out arrhythmia.  I      cannot rule out other potential causes.  2. Mildly elevated troponin levels of uncertain significance.  In the      setting of mild renal insufficiency, troponin elevations could be      secondary to this problem.  Coronary artery disease, however, needs      to be excluded.  3. Diabetes mellitus, present for greater than 15 years.  4. Acute on chronic renal insufficiency.  The patient has a solitary      kidney.  Creatinine is running around 2.4.  5. Hyperkalemia on admission.  6. History of hypertension.  7. Significant bradycardia on admission possibly contributing to      syncope resolved with holding medications and repairing      hyperkalemia.   RECOMMENDATIONS:  1. Cardiolite study to rule out underlying coronary artery disease and      to assess LV function.  2. Echocardiogram has already been done and demonstrates normal LV      function.   COMMENTS:  The patient is 60 and has a solitary kidney.  She has a  creatinine in the 2.4 to 2.9 range.  She is admitted with syncope.  She  was severely bradycardic and hyperglycemic as well as hyperkalemic on  admission.  With repair of these problems, bradycardia resolved.  Syncope was probably multi-factorial.  Since admission, however, she has  been noted to have mildly elevated troponins.  Echocardiogram has  demonstrated normal LV function.  She has had no  cardiopulmonary  complaints.   PHYSICAL EXAMINATION:  GENERAL:  On exam, the patient is in no acute  distress, she is sitting comfortably in bed.  VITAL SIGNS:  Heart rate 100, blood pressure 160/70, respirations 18 and  unlabored, temperature 97.6.  HEENT:  Unremarkable.  CHEST:  Clear.  NECK:  No JVD, carotid bruits, or thyromegaly.  LUNGS:  Clear to auscultation and percussion.  ABDOMEN:  Soft.  EXTREMITIES:  No edema.   EKG reveals poor R-wave progression with QRS pattern v1 through v3,  troponins have been 0.47, 0.96, and 0.64, on three successive  determinations with essentially normal CK MBs.  Troponin levels have not  been evaluated.  Again, echocardiogram revealed normal LV function.  We  are unable to adequately access regional wall motion.      Lyn Records, M.D.  Electronically Signed     HWS/MEDQ  D:  04/08/2006  T:  04/08/2006  Job:  [id number]  cc:   Holley Bouche, M.D.

## 2010-08-10 NOTE — Consult Note (Signed)
NAME:  [name] [name], [name] [name]                          ACCOUNT NO.:  [id number]   MEDICAL RECORD NO.:  [id number]                   PATIENT TYPE:  OUT   LOCATION:  GYN                                  FACILITY:  [facility]   PHYSICIAN:  De Blanch, M.D.         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  She has been followed by [name] [name], M.D.,  although we saw her again in consultation on April 28 because her CA-125  value was rising slightly.  The patient had [name] CAT scan which was equivocal  with some areas of abnormality in the pelvis.   Since her last visit the patient has done well.  She does note considerable  amount of weight gain, but has no nausea, vomiting, or change in bowel  habits.  Her functional status has been good.   PAST MEDICAL HISTORY:  As noted in my note of July 21, 2002.  These are  reviewed and are unchanged.   FAMILY HISTORY:  As noted in my note of July 21, 2002.  These are reviewed  and unchanged.   CURRENT MEDICATIONS:  As noted in my note of July 21, 2002.  These are  reviewed and unchanged.   REVIEW OF SYSTEMS:  As noted in my note of July 21, 2002.  These are  reviewed and are unchanged.   PHYSICAL EXAMINATION:  VITAL SIGNS:  Weight 194 pounds (up 7 pounds from  April).  HEENT:  Negative.  NECK:  Supple without thyromegaly.  LYMPH:  There is no supraclavicular or inguinal adenopathy.  ABDOMEN:  Obese, soft, nontender.  No mass, organomegaly, ascites, hernias  noted.  Midline incision is well healed.  PELVIC:  EGBUS, vagina, bladder, urethra are normal.  Cervix and uterus are  surgically absent.  Bimanual reveals no masses, induration, or nodularity.  RECTAL:  Confirms.  Hemorrhoids are  noted.   LABORATORIES:  The patient had [name] CA-125 value on September 24, 2002 which is 29.7  units/mL (previously it was 35.3 units/mL).   IMPRESSION:  Stage IIC borderline ovarian tumor.  The patient is clinically  free of disease and her CA-125 value seems to be stable.   Would recommend that she return in four months for [name] repeat CA-125 and if  this remains stable we will return her to the follow-up care of [name] [name], M.D.                                               De Blanch, M.D.  DC/MEDQ  D:  09/29/2002  T:  09/29/2002  Job:  [id number]   cc:   [name] [name], M.D.  [address] [place]  [place] [address]  Fax: [phone]   Telford Nab, R.N.  [id number]  [place], [place] [address]

## 2010-08-10 NOTE — Consult Note (Signed)
NAME:  [name], [name]                          ACCOUNT NO.:  [id number]   MEDICAL RECORD NO.:  [id number]                   PATIENT TYPE:  OUT   LOCATION:  GYN                                  [facility]ACILITY:  [facility]   PHYSICIAN:  De Blanch, M.D.         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  She  returns because of slight elevations in the CA125 over the past several  months.   The patient has done well.  She denies any GI or GU symptoms, has no pelvic  pain, pressure, vaginal bleeding, or discharge.  She has been on [name] weight  reduction diet.  She denies any pelvis pain or pressure.   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  She had complete resection,  and has been followed since that time with no evidence of recurrent disease.  Because of [name] slight rise in her CA125, she had [name] CT scan in November of  2004, which essentially was normal.   PAST MEDICAL HISTORY:   MEDICAL ILLNESSES:  Diabetes, arthritis, moderate obesity.   DRUG ALLERGIES:  None.   PAST SURGICAL HISTORY:  Total abdominal hysterectomy/bilateral salpingo-  oophorectomy, pelvic __________ lymph node dissection in 1995.   CURRENT MEDICATIONS:  1. Glucotrol.  2. Baby aspirin.   REVIEW OF SYSTEMS:  Negative, except as noted above.   PHYSICAL EXAMINATION:  VITAL SIGNS:  Weight 188 pounds (down 2 pounds).  GENERAL:  The patient is [name] healthy white female in no acute distress.  HEENT:  Negative.  NECK:  Supple without thyromegaly.  There is no supraclavicular or inguinal  adenopathy.  ABDOMEN:  Obese, soft, nontender.  No mass,  organomegaly, ascites, or hernia  is noted.  Midline incision is well healed.  PELVIC:  EGBUS, vaginal, bladder, urethra are normal.  The cervix and uterus  are surgically absent.  Adnexa without masses.  RECTOVAGINAL:  Confirms.   The patient had [name] CA125 on Aug 08, 2003 which was 57.  Review of her CA125's  over the past year show that they have been in the low 30's, except for one  in November which was as high as 48 units/ml.   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   PLAN:  We will release the patient from our practice and have her return to  see Dr. Nicholas Lose on an annual basis, and would continue to monitor her CA125's  on an annual basis.  She is  encouraged to continue weight loss.                                               De Blanch, M.D.    DC/MEDQ  D:  08/10/2003  T:  08/10/2003  Job:  [id number]   cc:   Gretta Cool, M.D.  [address] [place]  [place] [address]  Fax: [phone]   Telford Nab, R.N.  [phone] [address]  [place], [place] [address]

## 2010-08-10 NOTE — H&P (Signed)
NAME:  [name], [name]                ACCOUNT NO.:  [id number]   MEDICAL RECORD NO.:  [id number]          PATIENT TYPE:  INP   LOCATION:  1831                         FACILITY:  [facility]   PHYSICIAN:  Kela Millin, M.D.DATE OF BIRTH:  1946/01/26   DATE OF ADMISSION:  04/05/2006  DATE OF DISCHARGE:                              HISTORY & PHYSICAL   PRIMARY CARE PHYSICIAN:  Noberto Retort, M.D.   CHIEF COMPLAINT:  Syncope.   HISTORY OF PRESENT ILLNESS:  The patient is a 66 year old white female  with a past medical history for diabetes, hypertension, arthritis with  chronic low back pain, and a history of congenital absence of the left  kidney who presents following a syncopal episode.  She states that she  remembers that they had just pulled up the car to the front of their  home and she was about to get out of the car, and after that she does  not remember what happened.  Up to this point, she remembers feeling  dizzy and weak and told her family that if she passed out they should  give her 10 units of her insulin which her husband did as soon as they  were able to get the patient to the front porch and obtain her insulin  from the house.  Her husband states that she was unconscious for about  10-30 seconds, no jerking/seizure activity noted, also no incontinence  noted.   Per EMS, the patient was bradycardic, heart rate 45 when they arrived  and the patient was pale and diaphoretic.  Also, per EMS the patient  appeared to have a junctional rhythm en route but on arrival in the ER  was noted to be in first degree heart block.  The patient's husband  states that shortly after he gave her insulin following the episode,  they checked her blood glucose and at that point it was 478.  When EMS  arrived and checked the blood glucose it was 347.  And upon arrival in  the ER, the patient's blood glucose was 523.   Upon arrival to the ER, the patient was noted to be alert and  appropriate.  She denies chest pain, shortness of breath, cough, fevers,  nausea, vomiting, diarrhea, melena, and no hematochezia.  She states  that she has had a worsening of her low back pain recently and is  scheduled to have back surgery done by Dr. Otelia Sergeant on April 15, 2006.  She states that she has been compliant with her medications and her diet  has not changed.  The patient also reports that she is followed by Dr.  Aldean Ast and her last visit which was recently, she was told that her  kidney was at baseline (sees Dr. Aldean Ast secondary to congenital  absence of the left kidney).  In the ER the patient's blood glucose was  noted to be elevated at 523 as noted above.  BUN 62 with a creatinine of  3.6 (baseline unknown).  A urinalysis was negative for infection and her  point-of-care markers negative.  Her potassium was also noted  to be  elevated 7 initially and 6.5 on recheck.  And she was given calcium  chloride, insulin, and Kayexalate per ER physician.   PAST MEDICAL HISTORY:  1. As stated above.  2. 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   MEDICATIONS:  1. Lasix 40 mg p.o. q.a.m.  2. Novolin 70/30, 30 units q.a.m. and 10 units q.p.m.  3. Percocet 5/325, every four to six hours p.r.n.  4. TriCor 145 mg daily.  5. Verapamil 180 mg, two tablets daily.  6. Xanax 1 mg t.i.d. p.r.n.   ALLERGIES:  NKDA.   SOCIAL HISTORY:  She denies tobacco.  She also denies alcohol.   FAMILY HISTORY:  Her mother had diabetes, sister and brother have  diabetes.  A brother deceased, had lung cancer.  A sister deceased, had  breast cancer.   REVIEW OF SYSTEMS:  As per HPI, other review of systems negative.   PHYSICAL EXAMINATION:  GENERAL:  The patient is an obese, elderly white  female.  She is alert and oriented in no apparent distress.  VITAL SIGNS:  Her temperature 97.8.  Her blood pressure 160/66,  initially 134/60.  Pulse is 70.   Respiratory rate is 18.  O2 sat 100%.  HEENT:  PERRL.  EOMI.  Slightly dry mucous membranes.  No oral exudates.  Sclerae are anicteric.  NECK:  Supple.  No adenopathy.  No thyromegaly.  No JVD.  LUNGS:  Decreased breath sounds at the bases.  Otherwise clear to  auscultation.  No crackles and no wheezes.  CARDIOVASCULAR:  Regular rate and rhythm.  Normal S1 S2.  No S3  appreciated.  ABDOMEN:  Soft, bowel sounds present, nontender, nondistended.  No  organomegaly and no masses palpable.  EXTREMITIES:  No cyanosis and no edema.  NEUROLOGIC:  Alert and oriented x3.  Cranial nerves II-XII grossly  intact.  Her strength is 4-5 out of 5 and symmetric.  Nonfocal exam.   LABORATORY DATA:  Left shoulder x-ray:  Negative.  Her sodium is 132  with a potassium of 7.  Potassium 6.5 on recheck.  Her chloride is 105,  CO2 of 18, glucose 523, BUN 62, creatinine 3.6, and her point-of-care  markers negative x1.  Urinalysis:  Hazy, specific gravity 1.017, urine  glucose greater than 1,000, urine nitrite negative, urine ketones  negative, leukocyte esterase negative.  Her white cell count is 10.5  with a hemoglobin of 8.9, hematocrit 25.8, and the platelet count is  522, neutrophil count is 84%.   ASSESSMENT/PLAN:  1. Hyperglycemia/uncontrolled diabetes mellitus - in a patient with      mild metabolic acidosis in the setting of acute renal failure.      Hyperglycemic hyperosmolar nonketotic state versus diabetic      ketoacidosis.  Hydrate with IV fluids, close monitoring for volume      overload.  As noted, the patient has been on Lasix.  Intravenous      insulin per Glucomander protocol.  Accu-Chek monitoring.  Also      check a hemoglobin A1c, follow, and resume subcutaneous insulin      when off Glucomander.  Monitor electrolytes.  2. Acute renal failure -  Baseline creatinine unknown.  Obtain records      from office in the morning.  Hydrate as above and recheck     creatinine.  Obtain renal  ultrasound.  Consult nephrology if no  improvement.  3. Hyperkalemia secondary to above, status post calcium chloride,      insulin, and Kayexalate per emergency room as discussed above.      Follow and recheck.  4. Syncope - likely multifactorial,  As discussed above, per EMS the      patient was bradycardic initially (upon arrival to emergency room      the patient's heart rate was in the 70s).  Noted that the patient      has been on verapamil a total of 360 mg daily, we will hold      verapamil for now, monitor on telemetry, cardiac enzymes, also      obtain a 2D echocardiogram and carotid Doppler ultrasound and      follow.  5. Hypertension.  As discussed above, holding verapamil for now.      Norvasc.  Monitor and treat as appropriate.  6. Anemia.  Recheck H&H in the morning, also anemia workup and follow,      may be secondary to number two if      renal insufficiency is chronic.  Again, follow and obtain records      from office if baseline creatinine as well as baseline H&H      available.  7. Chronic low back pain.  Continue pain medications.  Surgery planned      for Dr. Otelia Sergeant on April 15, 2006.      Kela Millin, M.D.  Electronically Signed     ACV/MEDQ  D:  04/05/2006  T:  04/05/2006  Job:  [id number]   cc:   Melida Quitter, M.D.  Kerrin Champagne, M.D.

## 2010-08-10 NOTE — Consult Note (Signed)
NAME:  Faller, Joby                ACCOUNT NO.:  [id number]   MEDICAL RECORD NO.:  [id number]          PATIENT TYPE:  INP   LOCATION:  2040                         FACILITY:  [facility]   PHYSICIAN:  Terrial Rhodes, M.D.DATE OF BIRTH:  [birth year]   DATE OF CONSULTATION:  05/19/2006  DATE OF DISCHARGE:                                 CONSULTATION   PHYSICIAN REQUESTING CONSULT:  Everardo Beals. Juanda Chance, MD, [facility]   Ms. Rothenberger is a 65 year old female with a past medical history  significant for diabetes, hypertension, congenitally-absent left kidney,  presented to the [facility] with shortness of breath on  May 03, 2006.  She was noted to have severe metabolic acidosis with  hyperkalemia and oliguric renal failure.  She also required CPR and had  to be intubated.  During her stay at the [facility] she has  been to be emergently dialyzed.  She was also noted to have an acute non  ST elevated MI and was discharged from [facility] to [facility] for a cardiac cath and possible PCI.  Patient denies the use of  the NSAIDs or ACEs or ARBs.  She denies leg swelling or decreased  urination.  Patient was being followed up by Dr. Aldean Ast from the  neurology center and was noted to have a creatinine of 2.5 on March 06, 2006.  Patient denies the use of ACEs or ARBs recently.   ALLERGIES:  NO KNOWN DRUG ALLERGIES.   PAST MEDICAL HISTORY:  1. Type 2 diabetes  2. Anemia.  3. Chronic renal insufficiency, baseline creatinine 2.5.  4. Congenitally absent left kidney.  5. Ovarian cancer status post C-section, stage 2A.  6. Vertebral disc prolapse.  7. Coronary artery disease.  8. Syncope/bradycardia.   HOME MEDICATIONS:  1. Iron 325 mg daily.  2. Imdur 60 mg daily.  3. Norvasc 10 mg daily.  4. Nitroglycerin 0.4 mg p.r.n.  5. Senokot.  6. Tricor 145 mg daily.  7. Alprazolam 1 mg t.i.d.  8. Insulin 70/30 thirty units in the morning and ten units in the  evening.   FAMILY HISTORY:  Father died of Hodgkin's disease.  Mother died from  unknown cause.   SOCIAL HISTORY:  She is married, lives with her husband.  Denies alcohol  or tobacco use.   REVIEW OF SYSTEMS:  Negative.   PHYSICAL EXAMINATION:  Vital signs:  Temperature 98.9.  Heart rate 80.  Respiratory rate 20.  Blood pressure 166/87.  O2 sats were 95%.  Weight  was 94.6.  Blood sugar was 237.  GENERAL EXAM:  Alert, awake, normal built.  HEENT:  Oropharynx was clear.  Pupils equal and reactive.  NECK:  Supple, no JVD.  CV:  S1 and S2 present.  RESPIRATORY:  Air entry equal bilaterally.  Few bibasilar crackles.  ABDOMEN:  Bowel sounds are present, nontender, nondistended.  CNS:  Oriented x3.  EXTREMITIES:  No edema.  SKIN:  Normal.   LABORATORY DATA:  Admission labs:  Hemoglobin 11.2, hematocrit 33, WBC  7.9, sodium 136, potassium 3, chloride 103, bicarbonate 23,  BUN 33,  creatinine 4.5, GFR of 10, calcium of 8.1, albumin of 2.3, SGOT 23, SGPT  19, bilirubin 0.4, INR 1.1, hepatitis B negative.  Chest x-ray shows  borderline cardiomegaly with pulmonary vascular congestion.  Ultrasound  of the kidney done on April 01, 2006 shows no hydronephrosis with  solitary right kidney.   ASSESSMENT/PLAN:  A 65 year old female with diabetes, hypertension,  solitary right kidney who was recently admitted to [facility]  with VDRF, acute renal failure and non ST elevated myocardial  infarction.Transferred to [facility] for catheterization and  possible PCI.   1. Acute on chronic renal insufficiency, likely secondary to diabetes,      hypertension.  2. Coronary artery disease status post myocardial infarction.  Status      post cardiac catheterization and need for PTCA of LAD.  3. Diabetes.  4. Hypertension, poorly controlled.   PLAN:  Patient has had chronic renal insufficiency for the last few  years.  She has a creatinine of 4.5.  She will probably require   dialysis.  We will obtain cardiovascular thoracic surgeon evaluation for  AV fistula and Diatek catheter placement for long term dialysis  planning.  We will also check a renal function panel.  We will check for  parathyroid, iron, TIBC.  We will follow her urine output, weight,  potassium and creatinine on a daily basis and we will decide on  dialysis.  Patient may also need vascular renal duplex ultrasound to  rule out renal artery stenosis.  We will follow patient closely.      Ronda Fairly, M.D.  Electronically Signed     ______________________________  Terrial Rhodes, M.D.    Margreta Journey  D:  05/19/2006  T:  05/19/2006  Job:  [id number]

## 2010-08-10 NOTE — Discharge Summary (Signed)
NAME:  [name], [name]                ACCOUNT NO.:  [id number]   MEDICAL RECORD NO.:  [id number]          PATIENT TYPE:  INP   LOCATION:  4733                         FACILITY:  [facility]   PHYSICIAN:  Corinna L. Lendell Caprice, MDDATE OF BIRTH:  1945-07-17   DATE OF ADMISSION:  04/05/2006  DATE OF DISCHARGE:  04/09/2006                               DISCHARGE SUMMARY   DISCHARGE DIAGNOSES:  1. Syncope.  2. Bradycardia.  3. Hyperkalemia.  4. Acute on chronic renal failure.  5. Coronary artery disease.  6. Hypertension.  7. Uncontrolled diabetes.  8. Anemia, probably related to chronic kidney disease.  9. Left shoulder pain.   DISCHARGE MEDICATIONS:  1. Discontinue verapamil.  2. Hold Lasix until follow up with nephrologist.  3. Iron sulfate 325 mg a day.  4. Imdur 60 mg a day.  5. Norvasc 10 mg a day.  6. Nitroglycerin 0.4 mg sublingually as needed for chest pain.  7. Senokot nightly.  8. Continue TriCor 145 mg a day.  9. Alprazolam 1 mg p.o. 3 times a day as needed  10.Oxycodone/APAP 5/325 mg every 4-6 hours as needed for pain.  11.Insulin 70/30, 30 units subcutaneously in the morning, 10 units      subcutaneously in the evening.   FOLLOWUP:  Follow up with Dr. Tiburcio Pea. Follow up with nephrology.   CONDITION ON DISCHARGE:  Stable.   CONSULTATIONS:  Lyn Records, M.D.   PROCEDURES:  None.   DIET:  Should be diabetic, low salt.   ACTIVITY:  Ad lib.   PERTINENT LABORATORY DATA:  CBC on admission significant for hemoglobin  of 8.9, hematocrit 25.8, MCV 93, platelet count 522, normal white count.  Reticulocyte percentage normal, absolute reticulocyte count 78.9 which  is normal.  BMET on admission was significant for potassium of 7.0.  Her  sodium was 132, chloride 105, bicarbonate 18, glucose 523, BUN 62,  creatinine 3.61. Liver function tests significant for an albumin of 3.2,  otherwise unremarkable.  Hemoglobin A1c 8.8. Peak troponin was 0.96.  Peak  CPK was 176. Peak  CPK-MB was 17.6.  Iron 41, TIBC 310, B12 276,  folate greater than 20, RBC folate 1115, ferritin 146.  UA showed  greater than 1000 glucose, negative ketones, 100 protein, negative  nitrites, negative leukocyte esterase, many epithelial cells and hyaline  casts.   SPECIAL STUDIES AND RADIOLOGY:  Left shoulder x-ray was negative.   Renal ultrasound showed no evidence for hydronephrosis within the  solitary right kidney.   Stress Myoview showed focal apical infarct, no evidence of myocardial  ischemia, mild apical hypokinesis with normal wall motion elsewhere.  Ejection fraction 55%.   Echocardiogram showed limited study, but ejection fraction estimated at  65%.   EKG on admission showed normal sinus rhythm, age indeterminate  anteroseptal infarct.   HISTORY AND HOSPITAL COURSE:  Please see H&P for admission details.  The  patient has a history of diabetes, hypertension renal sufficiency who  presented after a syncopal episode.  She was pulled out of a car by  family members by her left arm. She remembered feeling dizzy  and weak,  and she was given 10 units of insulin by family members.  Her husband  reports she was unconscious for less than a minute. No seizure activity  noted.  EMS noted heart rate of 45, and the patient was pale and  diaphoretic.  She appeared to be in a junctional rhythm en route. Her  blood sugar was noted to be 347 when EMS arrived, and the patient was  alert and appropriate when they arrived.  She had a blood pressure of  160/66 and otherwise normal vital signs, dry mucous membranes.  Otherwise normal exam. She was admitted to step-down and had no further  bradycardia.  She was found to be hyperkalemic and was given medical  treatment for this in the emergency department including Kayexalate,  insulin, calcium chloride with good results.  The patient had no further  hyperkalemia.  She was started on an insulin drip and was given IV  fluids.  Her baseline  creatinine initially was unknown but was found to  be 2.7 per office records.   The patient's had an elevated troponin, and cardiology was consulted.  They performed a stress Cardiolite but felt that her syncope was more  likely to be related to the arrhythmia, volume contraction,  hyperglycemia. The Cardiolite showed a small region of apical infarction  or ischemia per Dr. Katrinka Blazing.  He recommended adding isosorbide and p.r.n.  nitroglycerin and reported that the patient could safely be discharged.  She was discharged by Dr. Suanne Marker. She had no chest pain during her  hospitalization nor could she recall any prior to hospitalization   At the time of discharge, her potassium was normal, and her diabetes was  well controlled. Her creatinine had gone down to 3.   She did complain of some left-sided shoulder pain but refused the MRI  secondary to claustrophobia. Her chest pain actually eventually improved  and may not need further followup if it continues to improve.      Corinna L. Lendell Caprice, MD  Electronically Signed     CLS/MEDQ  D:  05/06/2006  T:  05/06/2006  Job:  [id number]   cc:   Lyn Records, M.D.  Melida Quitter, M.D.  Kerrin Champagne, M.D.

## 2010-08-10 NOTE — Consult Note (Signed)
NAME:  Buczynski, Noheli A                          ACCOUNT NO.:  [id number]   MEDICAL RECORD NO.:  [id number]                   PATIENT TYPE:  OUT   LOCATION:  GYN                                  FACILITY:  [facility]   PHYSICIAN:  De Blanch, M.D.         DATE OF BIRTH:  1945/12/14   DATE OF CONSULTATION:  01/26/2003  DATE OF DISCHARGE:                                   CONSULTATION   A 65 year old white female returns for follow-up of a borderline ovarian  tumor.   INTERVAL HISTORY:  Since her last visit the patient has done well.  From a  gynecologic point of view she has no pelvic pain, pressure, vaginal bleeding  or discharge.  Functional status is good.  She has had, apparently, a flare  of arthritis in her knee requiring a cortisone injection which she alleges  made her diabetes become unstable.   The patient had a CA-125 value recently which was 48, slightly elevated over  the prior value of 29.   HISTORY OF PRESENT ILLNESS:  The patient underwent full resection and  staging of what turned out to be a stage IIC serous borderline tumor in  1995.   PAST MEDICAL HISTORY:  1. Diabetes.  2. Arthritis.   ALLERGIES:  None.   PAST SURGICAL HISTORY:  TAH, BSO, and node dissection 1995.   CURRENT MEDICATIONS:  1. Glucotrol XL.  2. Baby aspirin.   REVIEW OF SYSTEMS:  Negative except as noted above.   PHYSICAL EXAMINATION:  VITAL SIGNS:  Weight 190 pounds, blood pressure  180/78.  GENERAL:  The patient is a healthy white female in no acute distress.  HEENT:  Negative.  NECK:  Supple without thyromegaly.  LYMPH:  There is no supraclavicular or inguinal adenopathy.  ABDOMEN:  Soft, nontender.  No mass, organomegaly, ascites, or hernias  noted.  PELVIC:  EGBUS, vagina, bladder, urethra are normal.  Bimanual and  rectovaginal examination reveal no masses, induration, or nodularity.  EXTREMITIES:  Lower extremities without edema or varicosities.   IMPRESSION:  History  of a stage IIC serous borderline tumor 1995.  The  patient's CA-125 has risen slightly.  Whether this represents a recurrence  of her tumor versus elevation of CA-125 because of worsening diabetes or  arthritis is unclear.  We will go ahead and repeat a CT scan of the abdomen  and pelvis as it has been nearly seven months since her last CT.  It is  recalled that on that CT there is some thickening of the sigmoid colon, but  not thought to be suspicious for recurrent tumor.  Once the CT scan is  reported we will make further plans if necessary.  De Blanch, M.D.    DC/MEDQ  D:  01/26/2003  T:  01/26/2003  Job:  [id number]   cc:   Gretta Cool, M.D.  [address] [place]  [place] [address]  Fax: [phone]   Telford Nab, R.N.  [phone] [address]  [place], [place] [address]

## 2010-08-10 NOTE — H&P (Signed)
NAME:  [name], [name]                ACCOUNT NO.:  [id number]   MEDICAL RECORD NO.:  [id number]          PATIENT TYPE:  INP   LOCATION:  NA                           FACILITY:  [facility]   PHYSICIAN:  Gerrit Friends. Dietrich Pates, MD, FACCDATE OF BIRTH:  Feb 29, 1946   DATE OF ADMISSION:  05/12/2006  DATE OF DISCHARGE:                              HISTORY & PHYSICAL   HISTORY OF PRESENT ILLNESS:  A 65 year old woman admitted to [facility] approximately 1 week ago with respiratory failure caused by  acute pulmonary edema.  She developed enzymatic and echocardiographic  evidence of an acute myocardial infarction in the distribution of the  left anterior descending coronary artery.  Overall LV systolic function  was only mildly impaired.  She was also found to have acute on chronic  renal insufficiency.  She responded to fluid removal, both with  diuretics and dialysis, intubation, and general supportive measures.  Eventually, pulmonary edema cleared, allowing extubation.  She has done  well since, with no dyspnea and no chest discomfort.  She is ambulating  in the halls without difficulty.   Past medical history is notable for an episode of syncope last month  associated with a moderate elevation in troponin.  She was evaluated by  a cardiologist at that time, but no specific diagnosis was established.   The patient has had renal insufficiency and is known to have a solitary  kidney.  Creatinine has generally been approximately 2.5, but is now  closer to 4.5.  She has had ovarian carcinoma, hypertension, and  diabetes.   Past medical history, family history, social history, and review of  systems were updated.  No specific changes have occurred in-hospital,  except as noted above.   CURRENT MEDICATIONS:  1. Lantus insulin 14 units q.p.m., with suboptimal control of      hyperglycemia.  2. Pre-meal NovoLog insulin per the sensitive scale.  3. Protonix 40 mg daily.  4. Atorvastatin 40 mg  daily.  5. Aspirin 325 mg daily.  6. Metoprolol 50 mg b.i.d.  7. KCl 40 mEq b.i.d.  8. Levofloxacin 500 mg q.48 h.  9. High-dose furosemide plus low-dose metolazone recently      discontinued.   PHYSICAL EXAMINATION:  GENERAL:  Pleasant woman, in no acute distress.  VITAL SIGNS :  The temperature was 98.7, blood pressure 150/80, heart  rate 74 and regular, respirations 22, O2 saturation 98% on room air.  Weight is not recorded.  HEENT:  Anicteric sclerae; normal lids and conjunctivae.  NECK:  No jugular venous distension.  LUNGS:  Few bibasilar rales.  CARDIAC:  Normal first and second heart sounds.  ABDOMEN:  Soft and nontender; no organomegaly.  EXTREMITIES:  Trace edema.   INR this morning was 2.9.  A repeat value is normal.  The sample was  obtained while heparin was running.   IMPRESSION:  [name] has recovered well from life-threatening  pulmonary edema and respiratory failure following acute myocardial  infarction.  The amount of myocardial damage is relatively small.  Cardiac catheterization is warranted to define anatomy and select  optimal therapy.  The high risk of progression of renal insufficiency  has been explained to the patient, who understands that dialysis is in  her near future whether she undergoes cardiac catheterization or not.  She agrees to proceed.   The patient will also need CVTS consultation to perform surgical access  for ongoing dialysis.  Renal consultation will be obtained to assist in  management of her renal insufficiency.      Gerrit Friends. Dietrich Pates, MD, [facility]  Electronically Signed     RMR/MEDQ  D:  05/12/2006  T:  05/13/2006  Job:  [id number]

## 2010-08-10 NOTE — Cardiovascular Report (Signed)
NAME:  [name], [name]                ACCOUNT NO.:  [id number]   MEDICAL RECORD NO.:  [id number]          PATIENT TYPE:  INP   LOCATION:  2807                         FACILITY:  [facility]   PHYSICIAN:  Bruce R. Juanda Chance, MD, FACCDATE OF BIRTH:  11-11-1945   DATE OF PROCEDURE:  05/20/2006  DATE OF DISCHARGE:                            CARDIAC CATHETERIZATION   CLINICAL HISTORY:  [name] is 65 years old and was admitted to [facility] with acute pulmonary edema and a non-ST-elevation  infarction and acute on chronic renal failure.  She was dialyzed at  [facility].  She was transferred here and underwent diagnostic  catheterization last week.  This showed total occlusion of the LAD and  tight tandem lesions in the ostium and mid portion of the circumflex  artery.  We brought her back today for intervention.  She had access for  dialysis last Friday.  Her creatinine has been in the range of 4.   PROCEDURE:  The procedure was performed via the left femoral artery  using arterial sheath and a 6-French CLS 3.5 guiding catheter without  side holes.  We crossed the lesion in the ostium and mid portion of the  marginal branch with a Prowater wire without difficulty.  Before I did  that, the patient was given bivalirudin bolus and infusion and had been  previously loaded with Plavix and aspirin.  We were able to navigate a  Prowater wire across the lesion in the ostium of the mid portion of the  marginal branch.  We predilated both lesions of 2.25 x 20 mm Maverick  performing at two inflations up to 8 atmospheres for 30 seconds and each  lesion.  We then deployed a 2.5 x 16 mm Taxus stent in the mid lesion  deploying this with one inflation of 9 atmospheres for 30 seconds.  We  then deployed a second 2.5 x 60 mm Taxus stent at the ostium of the  marginal branch, deploying this with one inflation of 9 atmospheres for  30 seconds.  We dilated both lesions with a 2.5 x 12 mm Quantum Maverick  performing two inflations up to 16 atmospheres for 30 seconds within the  stent.  Final diagnostic then performed through the guiding catheter.  Patient tolerated the procedure well and left the laboratory in  satisfactory condition.   RESULTS:  Initially the stenosis at the ostium was estimated at 95%.  Following stenting, this improved to 0%.   Initially stenosis in the mid portion was 90%.  Following stenting, this  improved to 0%.   CONCLUSION:  Successful percutaneous coronary intervention of tandem  lesions in the ostium and mid portion of the marginal branch of the  circumflex artery using Taxus drug-eluting stents with improvement  ostial lesion from 95% to 0% and improvement in the mid lesion from 90%  to 0%.   DISPOSITION:  The patient returned to the post anesthesia care unit for  further observation.      Bruce Elvera Lennox Juanda Chance, MD, [facility]  Electronically Signed     BRB/MEDQ  D:  05/20/2006  T:  05/20/2006  Job:  [id number]   cc:   Gerrit Friends. Dietrich Pates, MD, [facility]  Cardiopulmonary Lab

## 2010-08-10 NOTE — Group Therapy Note (Signed)
NAME:  Martire, Birdia                ACCOUNT NO.:  [id number]   MEDICAL RECORD NO.:  [id number]          PATIENT TYPE:  INP   LOCATION:  IC03                          FACILITY:  [facility]   PHYSICIAN:  Margaretmary Dys, M.D.DATE OF BIRTH:  June 18, 1945   DATE OF PROCEDURE:  05/04/2006  DATE OF DISCHARGE:                                 PROGRESS NOTE   SUBJECTIVE:  A 65 year old female admitted yesterday in acute on chronic  renal failure, she was in acute cardiopulmonary arrest.   Patient was subsequently intubated, had CPR, had epinephrine in the  emergency room.  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  Patient was in severe  metabolic acidosis with a pH of 7.01.  She also had evidence of  respiratory acidosis with a pCO2 of 46.  Patient was transferred to the  Intensive Care Unit.  Her chest x-ray suggested pulmonary edema, which  was more likely given the acute renal failure and her elevated BNP.  However, infection/aspiration pneumonitis could not be excluded.   Emergency dialysis was started by the Nephrology doctor yesterday.  Patient has had remarkable improvement in her acid base status.  Her  potassium, which was also elevated and worsening yesterday, has also  been corrected.  She received 2 units of packed red blood cells  yesterday with some slight improvement in her H&H, there is no evidence  of active bleeding noted.  She had an uneventful night.   OBJECTIVE:  Patient was sedated with propofol, she appears comfortable.  She follows commands.  She remains intubated.  VITAL SIGNS:  Blood pressure 143/68, pulse of 76, respiratory rate is  14, she is not breathing above the set rate on the vent.  Oxygen  saturation is 97% with an FIO2 of 40%.  HEENT:  Normocephalic, atraumatic.  Patient has ET tube in place.  NECK:  Supple.  LUNGS:  Occasional  crackles at the bases.  No wheezing or rhonchi.  HEART:  S1, S2 regular.  No S3, S4, gallops or rubs.  ABDOMEN:  Soft, nontender, bowel sounds positive, no mass palpable.  EXTREMITIES:  No edema.  Patient has a Vas-Cath in the right femoral  vein.  CNS:  Patient is sedated but has no focal neurological deficits.   LABORATORY DIAGNOSTIC DATA:  Blood gas this morning on assist control,  tidal volumes of 500, FIO2 of 50% and a rate of 14 showed a pH of 7.43,  pCO2 is 35.8%, pO2 is 94.8 and a bicarbonate of 23.6, base deficit was  0.1.   White blood cell count was 10.1, hemoglobin of 8.1, hematocrit 23.5,  platelet count was 444,000 with neutrophils of 80%.   Sodium was 137, potassium 4.1, chloride of 101, CO2 26, glucose of 133,  BUN of 35, creatinine was 3.64, calcium was 7.6.  Cardiac enzymes showed  a total CK of 828, a CK MB of 60.9, a troponin I of 60.97 and B-  natriuretic peptide  of 1310.   ASSESSMENT AND PLAN:  1. Acute cardiopulmonary arrest likely secondary to severe metabolic      acidosis from acute on chronic renal failure.  She also had      moderate elevated potassium.  Patient  received dialysis yesterday      and her acid base status is corrected, she remains on mechanical      ventilation.  Chest x-ray done today still shows bilateral patchy      airspace disease concerning for pulmonary edema felt to be more      likely due to her acute renal failure presentation and also      possible aspiration pneumonitis when she coded.  Plan at this time:      To continue on mechanical ventilation for now.  I discussed with      Nephrology if we need to take some fluid off, patient has only made      250 mL of urine in the past 24 hours.  Nephrology will evaluate and      see if fluid needs to be taken out in order to improve her chest x-      ray and hence pulmonary status.  2. Severe anemia.  Patient received 2 units of packed red blood cells      yesterday and will give her  an additional 2 units today.  3. Acute elevation in her cardiac enzymes.  This could be related to      her severe cardiac arrest yesterday.  The high levels will also be      related to poor renal function.  I discussed with the cardiologist      on call, Dr. Sherryll Burger, from [facility], who said we should      continue to monitor her CK MB as she has remained normotensive and      appears to be hemodynamically stable with no initial complaints of      chest pain.  We should try to correct her acid base imbalance and      stabilize her first.  Will request a Cardiology consult here      tomorrow morning and also get a two-dimensional echocardiogram.      Would consider starting her on heparin infusion if patient becomes      stable.  I will also call the cardiologist on call today to discuss      her latest cardiac enzyme numbers.  My opinion is that the      patient's primary insult was severe metabolic acidosis from acute      on chronic renal insufficiency rather than a massive acute      myocardial infarction.  It is, however, likely that patient may      have underlying coronary artery disease which will need to be      pursued prior to discharge and when more stable.  4. Urinary tract infection.  Urinalysis and urine microscopy is      suggestive.  Patient is already on Levaquin and also Zosyn.  We are      awaiting urine cultures.  Urine culture thus far remains negative,      her blood cultures also remain negative, will also add vancomycin      .   DISPOSITION PERTINENT ISSUES:  1. Will need to continue to evaluate if patient is in pulmonary edema      and request Nephrology to take off some fluid.  Will need to  continue to monitor her heart and request Cardiology consult in the      morning if needed emergently again with [facility] on call.  2. Will transfuse with 2 units of packed red blood cells.  Continue IV     antibiotics with vancomycin, Zosyn and  Levaquin.   I have discussed the above plan with the husband in detail.  I do not  think patient is ready to be extubated today until we have clarity on  the issues outlined above.      Margaretmary Dys, M.D.  Electronically Signed     AM/MEDQ  D:  05/04/2006  T:  05/04/2006  Job:  [id number]

## 2010-08-10 NOTE — Assessment & Plan Note (Signed)
[facility]                            CARDIOLOGY OFFICE NOTE   NAME:[name], [name]                       MRN:          [id number]  DATE:07/14/2006                            DOB:          Aug 23, 1945    CARDIOLOGIST:  She was supposed to be established with Dr. [place] Bing in [place], although she has never seen him - she prefers to  follow with Dr. Juanda Chance (in [place]) who did her intervention.   PRIMARY CARE PHYSICIAN:  Dr. Holley Bouche   NEPHROLOGIST:  Dr. Kristian Covey   HISTORY OF PRESENT ILLNESS:  [name] is a 65 year old female patient  who was transferred to [facility] in February after  presentation with respiratory failure in the setting of non-ST elevation  myocardial infarction as well as acute on chronic renal failure.  She  had a stress test about a month prior to being hospitalized that did not  show any evidence of ischemia.  Post infarct she had an echocardiogram  revealing an EF of 45% to 50%.  She was transferred to [facility] for  further evaluation and treatment.  She underwent cardiac catheterization  February 19 by Dr. Juanda Chance.  This revealed severe 3 vessel CAD with a  total occlusion of the proximal LAD, 80% stenosis of the 1st diagonal  branch of the LAD, 95% and 70% stenosis in the marginal branch of the  circumflex artery and 50% stenosis in the RCA.  She was stabilized from  a renal standpoint.  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  She was seen by Dr. Kristian Covey in [place] as well as the  nephrologists in [place].  She underwent dialysis at [facility] as  well as [facility].  After her renal status was stabilized, she was  referred for PCI.  This was done February 26 by Dr. Juanda Chance.  She  underwent Taxus drug-eluting stent placement to the ostial lesion in the  circumflex as well as the mid-lesion in the circumflex.  Her LAD  occlusion was felt to be old.  Her  diagonal lesion was treated medically  as well as the RCA lesion.  She was actually discharged on May 22, 2006 from [facility].  She has not been followed from a  cardiovascular standpoint since that time.  She has been seeing her  nephrologist in [place].   She tells me that her nephrologist has taken her off of dialysis.  She  apparently has a normalized renal function, per her report.  This is  being followed very closely by Dr. Kristian Covey.  She denies any chest pain  or tightness.  Denies any exertional chest pain or pressure.  She denies  any exertional shortness of breath, nausea, diaphoresis, syncope, near  syncope, orthopnea or paroxysmal nocturnal dyspnea.  She does note  fatigue.  This is gradually improving since her discharge from the  hospital.  She is going to get started with cardiac rehab in the next  couple of weeks.  Of note she does tell me that she was on Lasix  80 mg  twice a day up until the last 3 days and she reduced her Lasix on her  own to 40 mg a day.  She was just concerned that she was on too high of  a dose.   CURRENT MEDICATIONS:  1. Plavix 75 mg daily.  2. Norvasc 10 mg daily.  3. Lopressor 100 mg b.i.d.  4. Aspirin 325 mg daily.  5. Lasix 80 mg 1/2 tablet daily.  6. Novolin insulin 70/30 as directed.  7. Nitroglycerin p.r.n. chest pain.   ALLERGIES:  No known drug allergies.   PHYSICAL EXAMINATION:  She is a well-nourished, well-developed female in  no acute distress.  Blood pressure is 142/70, pulse is 69.  Weight is 163 pounds.  HEENT:  Is unremarkable.  NECK:  Without JVD.  CARDIAC:  Normal S1, S2.  Regular rate and rhythm without murmurs,  clicks, rubs or gallops.  LUNGS:  Are clear to auscultation bilaterally without wheezes, rhonchi  or rales.  ABDOMEN:  Soft, nontender with normoactive bowel sounds.  No  organomegaly.  EXTREMITIES:  No edema.  Calves are soft, nontender.  SKIN:  Is warm and dry.  Right femoral  arteriotomy site without hematoma or bruit.   Electrocardiogram reveals sinus rhythm with a heart rate of 69,  anteroseptal Q waves.  No ischemic changes.   IMPRESSION:  1. Coronary artery disease.      a.     Status post acute non-ST elevation myocardial infarction       February 2008 as outlined above.      b.     Status post Taxus drug-eluting stent placement x2 to the       ostial and mid-obtuse marginal as outlined above.  c . Residual coronary artery disease including a totally occluded left  anterior descending (?chronic), 80% ostial, 1st diagonal stenosis and  50% stenosis of the right coronary artery.  1. Ischemic cardiomyopathy with an ejection fraction of 45% to 50%.  2. Ventilator dependent respiratory failure in February 2008 in the      setting of non-ST elevation myocardial infarction and acute on      chronic renal failure.  3. Acute on chronic renal failure requiring hemodialysis Feb. 2008      a.     Followed by Dr. Kristian Covey in [place].      b.     History of congenitally absent left kidney.      c.     No longer on hemodialysis.  4. Diabetes mellitus.  5. History of anemia of chronic disease.  6. History of ovarian cancer.  7. History of vertebral disc prolapse.  8. History of syncope in the setting of bradycardia.  9. Hypertension.   PLAN:  The patient presents to the office today for followup post  hospitalization.  She has actually not been seen since she was  discharged in late February.  She was setup to see Dr. Eden Emms in the  [place] office. She says she never knew about the followup  appointments being arranged for her.  She has some doctors that she sees  in [place] and would like to follow up here.  She really prefers to  see Dr. Juanda Chance back in followup.  I have made no changes to her  medications at this point in time.   One question I would have for her nephrologist, Dr. Kristian Covey, is whether or not she could be started on an ACE inhibitor.   With her diabetes, her  blood pressure goal would be less than 130/80.  However, in light of her  recent acute on chronic renal failure and need of dialysis on a short-  term basis, I am quite hesitant to use an ACE inhibitor.  If that is  something that he thinks she could tolerate, she would certainly benefit  from it.  In light of her renal problems, I would prefer that he make  that decision, so that her renal function could be monitored closely.   She is not having any anginal symptoms.  She is interested in cardiac  rehab and plans to start this soon.   I will have the patient follow up with Dr. Juanda Chance in the next 6 weeks.  She should return sooner should she have any recurrence of her symptoms.      Tereso Newcomer, PA-C  Electronically Signed      Luis Abed, MD, [facility]  Electronically Signed   SW/MedQ  DD: 07/14/2006  DT: 07/15/2006  Job #: [id number]   cc:   Holley Bouche, M.D.  Jorja Loa, M.D.

## 2010-08-10 NOTE — Procedures (Signed)
NAME:  [name], [name]                ACCOUNT NO.:  [id number]   MEDICAL RECORD NO.:  [id number]          PATIENT TYPE:  INP   LOCATION:  IC03                          FACILITY:  [facility]   PHYSICIAN:  Peter C. Eden Emms, MD, FACCDATE OF BIRTH:  May 14, 1945   DATE OF PROCEDURE:  05/05/2006  DATE OF DISCHARGE:                                ECHOCARDIOGRAM   INDICATIONS:  Congestive heart failure, left bundle branch block,  requiring intubation and dialysis.   Left ventricular cavity size is mildly dilated.  Ejection fraction was  45-50%.  There was distal septal and apical hypokinesis.   Mitral valve was mildly thickened with mild MR.  There was mild left  atrial enlargement.  Right-sided cardiac chambers were normal.  There is  no evidence pulmonary hypertension.   Subcostal imaging revealed no atrial septal defect, no source of  embolus, no effusion.   M-mode measurements showed an aortic dimension of 2.7 cm, left atrial  dimension of 3.9 cm, septal thickness 14 mm, LV diastolic dimension 48  mm, LV systolic dimension 38 mm.   FINAL IMPRESSION:  1. Mild left ventricular hypertrophy, ejection fraction 45-50% with      distal septal and apical  hypokinesis.  2. Mild mitral regurgitation.  3. Normal aortic valve.  4. Normal right atrium and right ventricle.  5. No pericardial effusion.      [name], MD, [facility]  Electronically Signed     PCN/MEDQ  D:  05/05/2006  T:  05/05/2006  Job:  [id number]

## 2010-08-10 NOTE — Consult Note (Signed)
NAME:  [name]                ACCOUNT NO.:  [id number]   MEDICAL RECORD NO.:  [id number]          PATIENT TYPE:  INP   LOCATION:  IC03                          FACILITY:  [facility]   PHYSICIAN:  Jorja Loa, M.D.DATE OF BIRTH:  1946/03/03   DATE OF CONSULTATION:  05/03/2006  DATE OF DISCHARGE:                                 CONSULTATION   REASON FOR CONSULTATION:  Hyperkalemia, metabolic acidosis, and  worsening of renal failure.   HISTORY:  [name] is a 65 year old Caucasian female with multiple  past medical history including history of diabetes, hypertension,  congenitally absent left kidney with possible chronic renal failure  presently came to the emergency room with shortness of breath and  difficulty breathing since at this morning.  After she came to the  emergency room, the patient was found to be in respiratory failure and  was intubated.  When blood work was done, the patient was found to have  pH of 7, low pCO2, and metabolic acidosis.  Hence, presently a consult  is called.  According to one of her daughters, she states that she was  told about the renal insufficiency about five years ago and recently,  when she went to [facility] because of a history of bradycardia  and hyperkalemia, her creatinine was 3.6.  During that time, not sure  whether that was an acute on chronic renal failure or baseline  creatinine.  However, according to the documentation during that time,  the baseline creatinine was not clear.  At this moment, since the  patient is intubated, it is very difficult to get any additional  history.   PAST MEDICAL HISTORY:  She has a history of diabetes for a long period,  history of also arthritis, history of hypertension, history of obesity,  history of bradycardia, and history of absent left kidney.  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   SOCIAL HISTORY:  No history of smoking or  alcohol abuse.   ALLERGIES:  No known allergies.   MEDICATIONS:  Her medications at this moment consist of IV Lasix 100 mg,  one dose Ativan 2 mg IV, and she is also getting Pavulon.   REVIEW OF SYSTEMS:  Not available.   PHYSICAL EXAMINATION:  VITAL SIGNS:  Her blood pressure is 120/70, heart rate was 71.  She is  afebrile.  When she came in, her systolic blood pressure, according to  ER physician, was more than 190, but presently her blood pressure has  normalized.  CHEST:  Clear to auscultation.  No rales or rhonchi.  No egophony.  HEART:  Regular rate and rhythm.  No murmur.  No S3.  ABDOMEN:  Positive bowel sounds.  EXTREMITIES:  She does not have any edema.   Her blood work from today reveals ABG with pH of 7.01, pCO2 of 46, and  O2 saturation is 94.7.  Her white blood cell count is 23.2, hemoglobin  8.9, hematocrit 27.7, platelets 939.  Her sodium is 138, potassium  repeat 6.2, CO2 of  17, chloride 113, BUN of 56, creatinine 4.06, and  calculated GFR is about 12.  Blood work from January 14, her creatinine,  the lowest it has been was 2.5.  Her albumin is 2.51 and a calcium of  9.3.  This morning, when she came, her potassium was 5.9 and CO2 of 10.  Presently, she is oliguric.  Her BNP is also 1310.  Her UA specific  gravity is 1.03, pH of 5, protein 100.  She has a positive nitrite,  positive leukocyte, she has many bacteria, 11-20 white blood cells in  the urine.   ASSESSMENT:  1. Renal insufficiency possibly at this moment acute on chronic.  She      has ultrasound which was done January 13 which showed absent left      kidney, during that time, there is no hydro on the right kidney,      and at this moment, also, there is no comment on the size of the      kidney.  Since she has, by history, elevated creatinine about five      years ago, she might have presently acute on chronic renal failure.      The etiology for acute renal failure could be secondary to prerenal       syndrome as the patient has significantly elevated urine specific      gravity, however, this also simply could be secondary to the      natural progression of her disease.  The patient is oliguric at      this moment.  2. Hyperkalemia seems to be worsening, potassium seems to be      increasing.  3. History of acidosis.  This is a combination of metabolic and      respiratory alkalosis.  She has received some sodium bicarb. Her      CO2 has improved.  4. History of anemia.  I am not sure whether this is iron-deficiency      anemia or anemia of chronic disease.  5. History of respiratory failure.  This could be probably secondary      to pneumonia.  The patient has been on antibiotics.  She does not      have any sign of fluid overload such as edema or swelling and so      forth.  Hence, probably we may be dealing with respiratory failure      secondary to pneumonia.  The patient is afebrile but her white      blood cell count seems to be high.  6. Diabetes.  She is on a hypoglycemic agent.  Blood sugar was high.  7. History of hyperkalemia, probably a combination of renal failure.      Since she is a diabetic patient, we need to rule out also type 4      RATA plus uncontrolled potassium intake, all may be contributing to      her etiology.  8. History of absent left kidney, congenital.  9. History of hypertension.  Presently her blood pressure seems to be      controlled reasonably good.  10.History of the bradycardia, probably a combination of a calcium      channel blocker and hyperkalemia, calcium channel blocker has been      stopped.   RECOMMENDATIONS:  Because of hyperkalemia, oliguric renal failure, and  metabolic acidosis, will consider dialyzing her and probably will  continue with hydration at this moment.  Will continue also with  diuretics. Will do iron studies and also will check her ultrasound. Will continue the other medications and will follow the patient. Will   Tsuei for line placement.      Jorja Loa, M.D.  Electronically Signed     BB/MEDQ  D:  05/03/2006  T:  05/03/2006  Job:  [id number]

## 2010-08-10 NOTE — Consult Note (Signed)
NAME:  Zeller, Etter                ACCOUNT NO.:  [id number]   MEDICAL RECORD NO.:  [id number]          PATIENT TYPE:  INP   LOCATION:  IC03                          FACILITY:  [facility]   PHYSICIAN:  Peter C. Eden Emms, MD, FACCDATE OF BIRTH:  Nov 13, 1945   DATE OF CONSULTATION:  DATE OF DISCHARGE:                                 CONSULTATION   Mrs. Miggins is a 65 year old patient admitted by [facility] for renal  failure and respiratory failure.   The patient has just been seen by Dr. Garnette Scheuermann, a cardiologist in  [place], on January 15.  The patient has had increasing renal failure  with bradycardia secondary to hyperkalemia and hyperglycemia.   Coronary risk factors include hypertension, diabetes and hyperlipidemia.   The patient is currently on a ventilator and getting dialysis through a  right femoral catheter.   Apparently over the weekend, the primary service was in touch with the  [facility] fellow on-call.  She was admitted to the hospital without  significant arrhythmia, but had significant pulmonary edema requiring  intubation.  Her cardiac enzymes were positive with a CPK of 828, an MB  of 60, and a troponin of 60.7.  Her BNP was 1310.   Of note, when the patient saw Dr. Garnette Scheuermann in the middle of January  for syncope, her troponins were elevated, but not nearly to this extent.  They were in the 0.47 to 0.96 range.   The patient had a 2D echocardiogram on January 15 which showed normal EF  of 65% with no regional wall motion abnormality.  She had a lower  Myoview on January 16 showing an apical defect with no ischemia and an  EF of 55%.   The patient's syncope was thought secondary to metabolic abnormalities  from her hyperglycemia and hyperkalemia.  No further cardiac workup was  indicated.   Here in the hospital, history is difficult to take.  I did talk to the  patient's husband.  She has not had any significant chest pain.   She was born with a solitary kidney and  has had some renal failure in  the 2.5 range.  It has recently been worse.   Aside from her episode of syncope in January, she has not had other  cardiac problems.  The husband says she has not had any significant  chest pain, PND or orthopnea.  She has had a little bit of lower  extremity edema.   PAST MEDICAL HISTORY:  Remarkable for ovarian cancer, a solitary kidney,  hypertension and diabetes.   FAMILY HISTORY:  Remarkable for diabetes on the mother's side including  a brother and sister.   MEDICATIONS:  Listed in the chart.  Prior to admission, she was taking  insulin.  Verapamil, which was discontinued.  Currently she is on:  1. Tricor 145 a day.  2. Norvasc 10 a day.  3. Iron supplements.  4. Isosorbide 60 a day.  5. Verapamil which has been stopped.  6. Novolin 70/30.  7. Percocet.  8. Xanax.  9. Lasix 40 mg a day.   She has  no allergies.   In talking to her husband, she had been driving and she had been active.  She does not work.  She does not smoke or drink.   She had good quality of life prior to this admission.   Her exam is remarkable for being sedated on a vent.  The blood pressure is 120/70.  Pulse is 70 and regular.  HEENT:  Normal.  Carotids are normal without bruits.  LUNGS:  Clear.  There is a soft systolic ejection murmur.  ABDOMEN:  Benign.  She has a right femoral dialysis catheter in place.  Distal pulses are intact with no edema.   Her EKG shows sinus rhythm with a left bundle branch block.   I do not have an old EKG to compare to.   Her chest x-ray shows resolving pulmonary edema.   Her lab work is remarkable for hematocrit of 31.7, K of 3.7, BUN 23,  creatinine 3.3.  As indicated, her troponin and CPKs were elevated.   IMPRESSION:  Despite a low-risk Myoview less than a month ago, the  appears to have had a subendocardial myocardial infarction.  I am not  sure if this is related to her acute hemodynamic changes, but may very  well be a  primary event that caused her intubation with pulmonary edema.  For the time being, we will check a 2D echocardiogram to assess her left  ventricular function.  If the images are poor, she may be a candidate  for a TEE at the bedside since she is currently intubated.   Once the patient is extubated and more stable, it may be worth while to  receive a heart catheterization given the patient's longstanding  diabetes and significant bump in her enzymes.   The patient should probably be started on low-dose heparin.   Further recommendations will be based on the results of her 2D  echocardiogram and serial enzymes.   The patient would appear to need chronic dialysis.  At this point her  creatinine is 3.5 and she has a solitary kidney.  If this indeed is the  case, then the decision to proceed with catheterization is less  bothersome. I spoke with the renal doctor in [place] and he is  amenable to transfering the patient to [facility] in the next 48 hrs for cath  and continued dialysis with placement of a hickman catheter by one of  our general surgeons.      [name], MD, [facility]  Electronically Signed     PCN/MEDQ  D:  05/05/2006  T:  05/05/2006  Job:  [id number]

## 2010-08-10 NOTE — Discharge Summary (Signed)
NAME:  [name], [name]                ACCOUNT NO.:  [id number]   MEDICAL RECORD NO.:  [id number]          PATIENT TYPE:  INP   LOCATION:  A228                          FACILITY:  [facility]   PHYSICIAN:  Osvaldo Shipper, MD     DATE OF BIRTH:  11-Jul-1945   DATE OF ADMISSION:  05/03/2006  DATE OF DISCHARGE:  02/19/2008LH                               DISCHARGE SUMMARY   PRIMARY CARE PHYSICIAN:  Holley Bouche, M.D. with [facility] in  [place].   DISCHARGE DIAGNOSES:  1. Acute non-ST elevation myocardial infarction with pulmonary edema.  2. Cardiopulmonary arrest requiring resuscitation.  3. Acute on chronic renal insufficiency in the setting of a lone      kidney.  4. Possible pneumonia.  5. Urinary tract infection.  6. Type 2 diabetes requiring insulin.  7. Anemia secondary to chronic disease.   Please see H&P dictated by Margaretmary Dys, M.D. for details  regarding the patient's presenting illness.   HOSPITAL COURSE:  Problem 1.  Cardiac.  This is a 65 year old Caucasian  female who presented to the emergency department with generalized  weakness.  She collapsed in the ED and was found to be in  cardiopulmonary arrest.  She required CPR and was intubated.  The  patient was successfully resuscitated and was transferred into the  intensive care unit.  Her x-ray showed pulmonary edema.  Her cardiac  markers initially showed a troponin of 0.49 and a BMP of 1310.  The  troponin rose significantly up to 60.9 with CK-MB of 60.9 as well.  It  was felt at that point that the patient had suffered possibly an acute  myocardial infarction which was probably a non-Q wave type.  The patient  was seen by [facility].  Apparently she had a stress test just  about a month ago which did not show evidence for ischemia.  Her EF at  that point was 55%.  She underwent a repeat echocardiogram while  hospitalized here and EF was again about 45-50%, but there was distal  septal  and apical hypokinesis that was noted.  The patient also had  evidence for left bundle branch block on EKG.  Cardiology said that once  the patient was stable she would need to have a cardiac catheterization.  Actually today, she is being transferred to [facility] to  undergo cardiac catheterization to be done by [facility].  She  has been continued on aspirin and beta blockers for now.   Problem 2.  Acute on chronic renal failure.  The patient has a single  kidney congenitally.  She obviously has had chronic renal insufficiency.  When she presented to the hospital, she was found to have a BUN of 56,  creatinine 4.6.  Because she was in pulmonary edema she required  emergent dialysis.  Her potassium was also elevated at 5.9 which went up  to 6.7.  After dialysis, her potassium came down, her renal function was  improved, however, the past few days has been trending upward.  Jorja Loa, M.D., nephrologist, has been following the  patient and the  patient was actually dialyzed through a femoral dialysis access.  It is  felt that the patient is going to need dialysis at least temporarily,  hence, she is going to have a more permanent dialysis placed while she  is at [facility].   Problem 3.  Diabetes.  She has a history of diabetes for which she was  taking insulin 70/30.  Her blood sugars were suboptimally controlled  during her admission.  We have been adjusting her Lantus over the past  couple of days.  Blood sugars, though better, are not still optimal.  This will further need to be adjusted while she is at [facility].   Problem 4.  History of hypertension, stable.  She also has a history of  stage 2C ovarian tumor diagnosed in 1995 and stable.  She has a history  of arthritis which is stable.  The patient was on heparin drip since she  was diagnosed with MI for a few days.  She also had some mild anemia  which appears to be anemia secondary  to chronic disease.  Other tests  include HB1C which was 7.0.   Problem 5.  Possible pneumonia.  Because the patient came in  cardiopulmonary arrest and etiology was not quite clear and x-ray  findings mostly suggested pulmonary edema, but pneumonia could not be  ruled out, hence, the patient was put on quite broad spectrum antibiotic  coverage with Vancomycin, Zosyn, and Levaquin.  As of yesterday, she had  completed about 7 days of Vancomycin and Zosyn, hence we discontinued  that.  Her Levaquin has been switched over to p.o. which may be  discontinued in the next couple of days.  I do not think she needs to go  home on it.  Other studies done include ultrasound of the kidney which  showed absence of the left kidney.  There was a solitary right kidney  with no hydronephrosis, however, medical renal disease was noted.  She  also had a follow-up x-ray yesterday of her chest which showed  borderline cardiomegaly with mild pulmonary left congestion and small  left effusion.   CURRENT MEDICATIONS:  1. Enteric-coated aspirin 81 mg daily.  2. Lipitor 40 mg daily.  3. Plavix 75 mg daily.  4. Lantus 20 units subcutaneous q.p.m.  This dose will need to be      adjusted.  5. Levaquin 500 mg p.o. q.48 hours.  This will also probably need to      be changed or discontinued in the next 2 days.  6. Metolazone 2.5 mg b.i.d.  7. Metoprolol she is supposed to be on 75 mg b.i.d. based on orders      yesterday.  8. Protonix 40 mg daily.  9. Potassium chloride 40 mEq b.i.d. for 2 days per nephrologist.   FOLLOWUP:  To be determined when she is discharged from [facility].  She will need to have her dialysis access placed while she is  there and she will also have to be dialyzed after she undergoes cardiac  catheterization to prevent contrast-related side effects.   On the day of transfer, the patient was quite asymptomatic.  No complaints.  Her vital signs showed elevated blood pressure  156/87,  saturating 95% on room air.  Otherwise vital signs were stable.  Telemetry showed a few PVC's, otherwise sinus rhythm.  Blood sugars  elevated at 237, 191.  Examination revealed slightly reduced air entry  at the  bases, but no crackles were appreciated, no wheezing.  Remainder of  examination was stable.  She had low potassium level today of 3.0 which  is being replaced.  Her coagulation profile is normal this morning.   Total time of discharge about 40 minutes.      Osvaldo Shipper, MD  Electronically Signed     GK/MEDQ  D:  05/13/2006  T:  05/13/2006  Job:  [id number]   cc:   Gerrit Friends. Dietrich Pates, MD, [facility]  [address]  [place], [place] [address]   Holley Bouche, M.D.  Fax: [phone]   Doylene Canning. Ladona Ridgel, MD  [address]  [address]  [place]  [place] [address]

## 2010-08-10 NOTE — Consult Note (Signed)
NAME:  [name], [name]                          ACCOUNT NO.:  [id number]   MEDICAL RECORD NO.:  [id number]                   PATIENT TYPE:  OUT   LOCATION:  GYN                                  FACILITY:  [facility]   PHYSICIAN:  De Blanch, M.D.         DATE OF BIRTH:  [birth year]   DATE OF CONSULTATION:  07/21/2002  DATE OF DISCHARGE:                                   CONSULTATION   REFERRING PHYSICIAN:  Gretta Cool, M.D.   REASON FOR CONSULTATION:  [name] 65 year old white female seen in consultation at  the request of Dr. Beather Arbour.   The patient's relative gynecological history dates back to September 1995  when she underwent exploratory laparotomy for [name] 15 cm left ovarian tumor.  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  She had  complete staging including omentectomy and pelvic and paraaortic  lymphadenectomy at that time.  Subsequently the patient has been followed by  Dr. Nicholas Lose.  She reports she has felt well and denies any GI or GU symptoms;  has no pelvic pain, pressure, vaginal bleeding or discharge.  She had  colonoscopy in March which was normal.   The patient has had [name] slight increase in her CA125 value as follows:   May 04, 2002 - 30 units/mL; May 28, 2002 - 31 units/mL; July 12, 2002  - 35/3 units/mL.   The patient has subsequently had [name] CAT scan of the abdomen which was normal  but in the pelvis there was [name] somewhat nodular/thickened appearance of the  rectosigmoid colon.  It was thought this could be partially related to  nondistention but colon wall thickening could be entirely excluded.  There  is no evidence of ascites.  [name] chest x-ray was normal except for mild  cardiomegaly and bronchitic changes.   PAST MEDICAL HISTORY:  1. Medical illnesses:  Diabetes.  2. Drug allergies:  None.  3. The patient has congenital absence of the left kidney.   FAMILY HISTORY:  Positive for sister with breast cancer but no gynecologic  or colon cancer.   CURRENT MEDICATIONS:  Glucophage, Accupril, Avandia, Zocor, Vivelle Dot, and  Amaryl.   REVIEW OF SYSTEMS:  Negative.  The patient comes accompanied by her daughter  today.   PHYSICAL EXAMINATION:  VITAL SIGNS:  Height 5 feet 5 inches, weight 187  pounds.  Blood pressure 162/82, pulse 80, respiratory rate 18.  GENERAL:  The patient is [name] healthy white female in no acute distress.  HEENT:  Negative.  NECK:  Supple without thyromegaly.  NODES:  There is no supraclavicular or inguinal adenopathy.  ABDOMEN:  Slightly obese, soft, nontender.  No masses, organomegaly,  ascites, or hernias noted.  The midline incision is well healed.  PELVIC:  EG/BUS, vagina, bladder, urethra are normal.  The vaginal cuff is  well healed; no lesions  are noted.  Bimanual and rectovaginal exam reveal no  masses, induration, or nodularity.  The patient does have some external  hemorrhoids.   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  The  patient has [name] slight rise in her CA125 and an inconclusive CT scan.   At this juncture I do not think we have enough evidence to warrant surgical  intervention and therefore would recommend follow-up with [name] repeat CA125 in  approximately two months.  If this continues to become even further elevated  then I would suggest [name] repeat CT scan.   The patient and her daughter are aware that if it becomes very likely that  she has recurrent disease, surgical intervention and attempted resection are  the recommendation.  The patient is comfortable with these plans and will  return for [name] repeat CA125 as noted above.                                               De Blanch, M.D.    DC/MEDQ  D:  07/21/2002  T:  07/21/2002  Job:  [id number]   cc:   Gretta Cool, M.D.  [address] [place]  [place] [address]  Fax: [phone]   Telford Nab,  R.N.

## 2010-08-10 NOTE — Cardiovascular Report (Signed)
NAME:  [name], [name]                ACCOUNT NO.:  [id number]   MEDICAL RECORD NO.:  [id number]          PATIENT TYPE:  OIB   LOCATION:  2807                         FACILITY:  [facility]   PHYSICIAN:  Bruce R. Juanda Chance, MD, FACCDATE OF BIRTH:  1946/03/19   DATE OF PROCEDURE:  05/13/2006  DATE OF DISCHARGE:                            CARDIAC CATHETERIZATION   CLINICAL HISTORY:  [name] is 65 years old and recently moved from  [place] to [place] about 1 year ago.  She has chronic renal  insufficiency with a creatinine in the range of about 3.6.  She was  recently admitted to [facility] with acute shortness of breath  and pulmonary edema requiring intubation.  Her enzymes were positive  consistent with a non-ST-elevation myocardial infarction.  She also had  renal insufficiency with creatinine in the range of 4 and marked  acidosis.  This improved with treatment, and she was able to be weaned  from the ventilator.  She was dialyzed on three separate occasions and  was seen in consultation by Dr. Kristian Covey.  She had an echocardiogram  which showed anterior and apical hypokinesis with an estimated ejection  fraction of 45-50%.  She was transferred to [facility] today for  further evaluation with catheterization.  She has a temporary indwelling  sheath in the femoral vein for access for dialysis.   PROCEDURE:  Right heart catheterization was performed percutaneously via  the left femoral vein using a mini sheath and Swan-Ganz thermodilution  catheter.  Left heart catheterization was performed percutaneously via  left femoral artery using arterial sheath and 6-French preformed  coronary catheters.  We measured left ventricular pressures but did not  do a left ventriculogram to conserve contrast.  The patient tolerated  the procedure well and left the laboratory in satisfactory condition.   RESULTS:  LEFT MAIN CORONARY ARTERY was free of significant disease.   LEFT ANTERIOR  DESCENDING ARTERY was completely occluded after a first  diagonal branch and small septal perforator.  The distal LAD filled via  collaterals from the right coronary.  There was 80% narrowing in the  first diagonal branch.   THE CIRCUMFLEX ARTERY gave rise to a moderately large marginal branch  and a posterolateral branch.  There was 95% proximal stenosis in the  marginal branch.  There was 70% stenosis in the mid portion of the  marginal branch.   THE RIGHT CORONARY ARTERY had a 50% mid stenosis which was segmental.  The rest of the vessel is free of major obstruction.  The right coronary  artery fills the distal LAD by collaterals.   No left ventriculogram was performed.   HEMODYNAMIC DATA:  The right atrial pressure was 4 mean.  The pulmonary  artery pressure was 51/25 with mean of 37.  Pulmonary artery wedge  pressure was 21-24.  The left ventricular pressure was 169/16.  Aortic  pressure 169/67 with mean of 111.   I do not have the details of the cardiac output, but the cardiac index  was greater than 3.   IMPRESSION:  1. Coronary artery disease with  recent non-ST-elevation myocardial      infarction, acute pulmonary edema and renal failure.  2. Severe three-vessel disease with total occlusion of the proximal      left anterior descending artery, 80% stenosis in the first diagonal      branch of the left anterior descending, 95 and 70% stenoses in the      marginal branch of the circumflex artery, and 50% stenosis in the      right coronary.  3. Ejection fraction 45-50% with anterior and apical wall hypokinesis      by echocardiography.   RECOMMENDATIONS:  It is not totally clear what the culprit for the  patient's recent infarct was.  She had a Myoview scan prior to her acute  event which showed an apical defect, so the LAD occlusion could be old,  and the circumflex marginal lesion could be the acute lesion.  The  marginal lesion is hypodense and does have the appearance  of a possible  ruptured plaque.  I think the marginal branch should be treated with  PCI.  I will defer this until after the patient has had renal  consultation here to decide the long-term plans regarding access and  dialysis.      Bruce Elvera Lennox Juanda Chance, MD, [facility]  Electronically Signed     BRB/MEDQ  D:  05/13/2006  T:  05/13/2006  Job:  [id number]   cc:   [name], MD, [facility]  Cardiopulmonary Lab

## 2010-08-10 NOTE — Group Therapy Note (Signed)
NAME:  [name], [name]                ACCOUNT NO.:  [id number]   MEDICAL RECORD NO.:  [id number]          PATIENT TYPE:  INP   LOCATION:  A228                          FACILITY:  [facility]   PHYSICIAN:  Margaretmary Dys, M.D.DATE OF BIRTH:  11/22/1945   DATE OF PROCEDURE:  05/11/2006  DATE OF DISCHARGE:                                 PROGRESS NOTE   SUBJECTIVE:  The patient feels better.  She had dialysis yesterday.  She  has been transferred to [facility] tonight for cardiac  catheterization, otherwise she denies any chest pain, she is getting  ready to ambulate this morning and she remains in great spirits.   OBJECTIVE:  GENERAL:  Conscious, alert, comfortable, not in acute  distress.  VITAL SIGNS:  Blood pressure 166/82, pulse of 87, respirations 16,  temperature 98.2, blood sugars have continued to range between 266-412.  Oxygen saturation was 96% on room air.  HEENT:  Normocephalic, atraumatic.  Oral mucosa was moist.  NECK:  Supple.  No JVD.  LUNGS:  Clear to auscultation, crackles at the bases but was mostly  clear.  HEART:  S1/S2 regular.  No S3/S4 gallops or rubs.  ABDOMEN:  Soft, nontender, bowel sounds were positive.  No masses  palpable.  EXTREMITIES:  No edema.   LABORATORY/DIAGNOSTIC DATA:  Potassium today was down to 3.0, this has  been corrected by the nephrologist; sodium was 138, chloride of 101, CO2  24, BUN of 22, creatinine was 3.53, calcium was 8.0.   ASSESSMENT AND PLAN:  1. Acute cardiopulmonary arrest likely secondary to severe metabolic      acidosis with acute on chronic renal failure.  The patient was      doing much better at dialysis yesterday but it is unclear if the      patient will remain on long-term dialysis.  She continues to make      urine at this time.  We will defer to nephrologist regarding long-      term plans for dialysis.  2. Elevated cardiac enzymes with an ejection fraction of 45-50% on      echocardiogram and septal and  apical hypokinesis.  The patient is      being transferred tomorrow for a cardiac catheterization.  She is      currently chest pain free.  3. Hyperglycemia.  She is currently on a sliding scale.  We will      continue the sensitive sliding scale for now.  If her sugars do not      improve today, I think she will need to be started on Lantus in      addition to her current NovoLog.   DISPOSITION:  The patient has been transferred tonight for cardiac  catheterization.  We will have to continue to monitor her blood sugars  very closely.  The patient is chest pain free,  and is continuing  followup by nephrology.      Margaretmary Dys, M.D.  Electronically Signed     AM/MEDQ  D:  05/11/2006  T:  05/11/2006  Job:  [id number]

## 2010-08-10 NOTE — Discharge Summary (Signed)
NAME:  [name], [name]                ACCOUNT NO.:  [id number]   MEDICAL RECORD NO.:  [id number]          PATIENT TYPE:  INP   LOCATION:  6523                         FACILITY:  [facility]   PHYSICIAN:  Madolyn Frieze. Jens Som, MD, FACCDATE OF BIRTH:  15-Sep-1945   DATE OF ADMISSION:  05/13/2006  DATE OF DISCHARGE:  05/22/2006                               DISCHARGE SUMMARY   PRIMARY CARDIOLOGIST:  Dr. Wimauma Bing.   PRIMARY NEPHROLOGIST:  Dr. Kristian Covey in [place].   PRINCIPAL DIAGNOSIS:  Acute non-ST-elevation myocardial  infarction/coronary artery disease.   SECONDARY DIAGNOSES:  1. End-stage renal disease.  2. Type 2 diabetes mellitus.  3. Anemia of chronic disease.  4. History of ovarian cancer.  5. History of vertebral disk prolapse.  6. History of syncope in the setting of bradycardia.   ALLERGIES:  NO KNOWN DRUG ALLERGIES.   PROCEDURES:  1. Left heart cardiac catheterization, percutaneous coronary      intervention and stenting of the ostial and mid obtuse marginal      with Taxus drug-eluting stents x2.  2. Arteriovenous fistula and Diatek catheter placement.   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  In the triage area, she was noted to  experience respiratory arrest with unresponsiveness, requiring  intubation as well as cardiopulmonary resuscitation.  She was  subsequently resuscitated and admitted to the ICU at [facility], where she was found by cardiac enzymes to have ruled in for  non-ST-elevation MI.  An echocardiogram was performed there on January  13, revealing an EF of 65% with suggestion of an anterior MI.  The  patient was seen at [facility] by Dr. Kristian Covey secondary to metabolic  acidosis, hyperkalemia and oliguric renal failure with a  creatinine as  high as 4.39.  As the patient's respiratory status improved and she was  extubated, she was transferred to [facility] for further evaluation of  non-ST-elevation MI.   HOSPITAL COURSE:  [name] underwent left heart cardiac  catheterization on February 19, which revealed an occluded LAD with  right-to-left collaterals filling the distal vessel, an 80% stenosis in  the first diagonal, a 95% stenosis in the proximal first obtuse  marginal, and a 50% proximal stenosis in the RCA.  The obtuse marginal  stenosis was felt to be the culprit vessel; however, no additional  cardiac intervention was performed until the patient was seen by  Nephrology.  Because of her renal failure, the patient required  continued dialysis, which was initially initiated at [facility], and she underwent successful AV fistula and Diatek placement  on February 22, which she tolerated well.  Following this, she was  placed on Plavix therapy with plans to intervene upon the first obtuse  marginal branch of left circumflex and she was taken back to the cath  lab on February 26, where the obtuse marginal was stented with two 2.5 x  60-mm Taxus drug-eluting stent.  She tolerated this procedure well and  postprocedure, the nephrology team has made arrangements for her to have  outpatient renal followup with Dr. Kristian Covey in [place] with an eye  towards outpatient dialysis setup at [facility] in [place].  Overall,  [name] has improved markedly without any recurrent chest pain or  issues with pulmonary edema.  She is being discharged home today in  satisfactory condition.   DISCHARGE LABORATORY DATA:  Hemoglobin 10.9, , hematocrit 31.1, WBC 7.0,  platelets 385,000.  Sodium 138, potassium 3.9, chloride 108, CO2 20, BUN  54, creatinine 4.85, glucose 188, albumin 2.3, calcium 8.3, phosphorus  6.2, total bilirubin 0.7, alkaline phosphatase 44, AST 22, ALT 17, total  protein 6.2.  CK 38, MB 2.5,  ferritin 348.  Parathyroid hormone 69.6,  total calcium 8.4.  Urinary creatinine 84.5.  Urine sodium 67.   DISPOSITION:  The patient is being discharged home today in good  condition.   FOLLOWUP APPOINTMENTS:  She has a followup with Dr. Kristian Covey on Monday,  March 3, at 9:00 a.m.  She is to follow up Dr.  Bing on March  12, at 11:30 a.m.   DISCHARGE MEDICATIONS:  1. Plavix 75 mg daily.  2. Nitroglycerin 0.4 mg sublingual p.r.n. chest pain.  3. Norvasc 10 mg daily.  4. Lipitor 40 mg nightly.  5. Lopressor 100 mg b.i.d.  6. Lantus 35 units nightly.  7. Aspirin 325 mg daily.  8. Lasix 80 mg b.i.d.   OUTSTANDING LABORATORY STUDIES:  None.   DURATION OF DISCHARGE ENCOUNTER:  Sixty minutes including physician  time.      Nicolasa Ducking, ANP      Madolyn Frieze. Jens Som, MD, [facility]  Electronically Signed    CB/MEDQ  D:  05/22/2006  T:  05/22/2006  Job:  [id number]   cc:   Jorja Loa, M.D.

## 2010-08-10 NOTE — H&P (Signed)
NAME:  [name], [name]                ACCOUNT NO.:  [id number]   MEDICAL RECORD NO.:  [id number]          PATIENT TYPE:  INP   LOCATION:  A228                          FACILITY:  [facility]   PHYSICIAN:  Margaretmary Dys, M.D.DATE OF BIRTH:  02-Oct-1945   DATE OF ADMISSION:  05/03/2006  DATE OF DISCHARGE:  02/19/2008LH                              HISTORY & PHYSICAL   PRIMARY CARE PHYSICIAN:  Dr. Tiburcio Pea, [place], [place].   ADMISSION DIAGNOSES:  1. Acute cardiopulmonary arrest.  2. Severe metabolic acidosis.  3. Acute on chronic renal insufficiency.  4. Hyperkalemia.  5. Hyperglycemia.  6. History of recent upper respiratory tract infection.  7. Urinary tract infection.   CHIEF COMPLAINT:  Generalized weakness and subsequent cardiac arrest in  the emergency room.   HISTORY OF PRESENT ILLNESS:  [name] is a 65 year old female who  presented to the emergency room earlier today with some generalized  weakness.  She was brought in by her daughter.  Apparently the patient  was waiting to be seen in triage.  She collapsed, going into respiratory  arrest.  The patient became unresponsive and also had cardiopulmonary  arrest.  The patient subsequently had cardiopulmonary resuscitation and  was also intubated mechanically. She was said to go in to ventricular  fibrillation nd was shockedx2 and epinephrine with development of atrial  fibrillation with rapid ventricular response. However, since she was  intubated she has remained stable .  Her chest x-ray post intubation  showed diffuse air space disease likely compatible with edema.   She apparently has been ill with generalized weakness,  some cough with  upper respiratory tract infection about two weeks ago.  After I had  extensive discussions with the family it appears that over the past six  months, her primary care physician in [place] had been telling her  that she was going to need to see a kidney specialist which she  has  declined to do.  She was actually admitted on April 05, 2006 to [facility] for syncope.  The patient was noted to be bradycardic at  the time she arrived in the emergency room.  At that time her heart rate  was 45 and her blood glucose was 523.  It emerged that the patient has  congenital absence of the left kidney, Her BUN was 30 with high of 62,  creatinine of 3.6, potassium was 7.  As a result the patient was treated  for what appeared to be severe hyperglycemia with acute on chronic renal  failure and metabolic acidosis.  The patient did fairly well and was  discharged from the hospital a few days later.   At the time she was also noted to have elevated cardiac enzymes but an  echocardiogram  revealed normal LV function.   The patient is currently intubated and on mechanical ventilation  and  sedated.   REVIEW OF SYSTEMS:  Difficult to obtain, but it appears the patient has  been progressively weak over the past two weeks.  She did not complain  of decreased urinary output, but did complain  of some headaches to the  husband.   PAST MEDICAL HISTORY:  1. Recent admission at [facility] on April 05, 2006, for      acute on chronic renal insufficiency with hyperglycemia and      hyperkalemia.  2. History of stage II-C serous borderline ovarian tumor diagnosed in      1995.  3. Diabetes mellitus.  4. Hypertension.  5. Arthritis with chronic low back pain.  6. Congenital absence of the left kidney.  7. History of syncopal episode.   MEDICATIONS:  1. She is currently on Lasix twice a day.  2. Novolin 70/30.  3. Percocet.  4. Tricor.  5. Verapamil.  6. Xanax.  At the time of this dictation the strength of the doses was      unknown.   ALLERGIES:  She has no known drug allergies.   FAMILY HISTORY:  Not obtainable.   SOCIAL HISTORY:  The patient is married, lives with her husband.  No  history of alcohol or smoking abuse.  No illicit drug use.  The  patient  is fairly independent of activities of daily living.   PHYSICAL EXAMINATION:  GENERAL:  The patient was sedated, intubated on  mechanical ventilation.  VITAL SIGNS:  Blood pressure is 178/80, pulse 82, respirations 12, which  is the rate on her settings  for mechanical ventilation.  Oxygen  saturation was 97% on 100% FIO2.  HEENT:  Pupils were equally reactive to light and accommodation.  EG  tube in place.  NECK:  Supple.  LUNGS:  Lungs have bilateral crackles, mostly at the bases.  HEART:  S1/S2, regular.  No history of gallops or rubs.  ABDOMEN:  Soft, nontender.  Bowel sounds were positive.  No masses  palpable.  EXTREMITIES:  No pitting pedal edema.  CNSExam:  The patient was sedated, difficult to appreciate any focal  neurological deficits.   LABORATORY/DIAGNOSTIC DATA:  The pH was 7.01, PCO2 46, Bicarb was 12.7.  White blood cell count was 23.7, hemoglobin 8.9, hematocrit 27.7.  Platelet count was 939.  Sodium 138, potassium 6.2, CO2 10, chloride  113, BUN 56, creatinine was 4.06.  Albumin 2.51, calcium 9.3.  The  patient has not made any urine.  BNP was 1310.  Urine shows positive  nitrite, positive leukocyte with many bacteria, and 11-20 white cells.   Chest x-ray shows diffuse air space disease compatible with edema or  infection.   Initial cardiac enzymes showed a total CK of 164, a CK-MB of 9.1 with  troponin I of 0.49.   ASSESSMENT/PLAN:  A 65 year old female who presented to the emergency  room initially brought in by family members with generalized weakness,  but became unresponsive with cardiopulmonary arrest.  The patient  received CPR , Defibrillation x2 and was also intubated and placed on  mechanical ventilation.   I think this is more likely due to her hyperkalemia and also metabolic  acidosis as a result of acute renal failure.  The patient does have  evidence of congenital absence of the left kidney on a recent ultrasound from April 06, 2006.   Plan will be to give Lasix 200 mg IV x1 and see  if she will make any urine.  I suspect that her respiratory failure and  CXR findings is  more likely due to pulmonary edema.  I will also notify  the nephrologist on call, Dr. Kristian Covey, as I think the patient may need  emergency dialysis .  While  we are waiting I will start her on a  bicarbonate infusion at 75 cc an hour.   Will increase the rate on her mechanical ventilation to 20 to help blow  off some carbon dioxide and correct her severe acidosis.   She will be started on an insulin drip to help manage her blood sugars  and also her hyperkalemia.  She did have severe hypertension when she  came in but with the Lasix and potential hemodialysis I think this will  be corrected.  If it does not we will evaluate for  antihypertensive  therapy.  I again discussed with the family members that it might be  very important at this time to consider hemodialysis, but I will defer  this to the nephrologist.   Based on her urine appearance I will also treat her with Zosyn and  Levaquin and also add vancomycin for  possible sepsis from urinary tract  infection.  She did have a leukocytosis, but it is noted that she did  not have a fever, neither was she hypothermic.   The patient is admitted to intensive care unit.      Margaretmary Dys, M.D.  Electronically Signed     AM/MEDQ  D:  05/03/2006  T:  05/04/2006  Job:  [id number]

## 2010-08-19 ENCOUNTER — Observation Stay (HOSPITAL_COMMUNITY)
Admission: EM | Admit: 2010-08-19 | Discharge: 2010-08-22 | Disposition: A | Discharge status: Home / Self Care | Payer: Medicare Other | Attending: Hospitalist | Admitting: Hospitalist

## 2010-08-19 ENCOUNTER — Emergency Department (HOSPITAL_COMMUNITY): Payer: Medicare Other

## 2010-08-19 DIAGNOSIS — N186 End stage renal disease: Secondary | ICD-10-CM | POA: Insufficient documentation

## 2010-08-19 DIAGNOSIS — I120 Hypertensive chronic kidney disease with stage 5 chronic kidney disease or end stage renal disease: Secondary | ICD-10-CM | POA: Insufficient documentation

## 2010-08-19 DIAGNOSIS — E119 Type 2 diabetes mellitus without complications: Secondary | ICD-10-CM | POA: Insufficient documentation

## 2010-08-19 DIAGNOSIS — R4789 Other speech disturbances: Secondary | ICD-10-CM | POA: Insufficient documentation

## 2010-08-19 DIAGNOSIS — Z992 Dependence on renal dialysis: Secondary | ICD-10-CM | POA: Insufficient documentation

## 2010-08-19 DIAGNOSIS — I6350 Cerebral infarction due to unspecified occlusion or stenosis of unspecified cerebral artery: Principal | ICD-10-CM | POA: Insufficient documentation

## 2010-08-19 DIAGNOSIS — I2510 Atherosclerotic heart disease of native coronary artery without angina pectoris: Secondary | ICD-10-CM | POA: Insufficient documentation

## 2010-08-19 LAB — COMPREHENSIVE METABOLIC PANEL
Alkaline Phosphatase: 79 U/L (ref 39–117)
BUN: 41 mg/dL — ABNORMAL HIGH (ref 6–23)
CO2: 24 mEq/L (ref 19–32)
Chloride: 103 mEq/L (ref 96–112)
GFR calc non Af Amer: 10 mL/min — ABNORMAL LOW (ref >60)
Glucose, Bld: 167 mg/dL — ABNORMAL HIGH (ref 70–99)
Potassium: 4.6 mEq/L (ref 3.5–5.1)
Total Bilirubin: 0.2 mg/dL — ABNORMAL LOW (ref 0.3–1.2)
Total Protein: 7.2 g/dL (ref 6.0–8.3)

## 2010-08-19 LAB — DIFFERENTIAL
Eosinophils Relative: 3 % (ref 0–5)
Lymphocytes Relative: 16 % (ref 12–46)
Lymphs Abs: 0.9 10*3/uL (ref 0.7–4.0)
Monocytes Absolute: 0.4 10*3/uL (ref 0.1–1.0)
Neutro Abs: 4.3 10*3/uL (ref 1.7–7.7)

## 2010-08-19 LAB — CK TOTAL AND CKMB (NOT AT ARMC)
CK, MB: 4.4 ng/mL — ABNORMAL HIGH (ref 0.3–4.0)
Total CK: 125 U/L (ref 7–177)

## 2010-08-19 LAB — CBC
HCT: 34.7 % — ABNORMAL LOW (ref 36.0–46.0)
Hemoglobin: 11.4 g/dL — ABNORMAL LOW (ref 12.0–15.0)
MCHC: 32.9 g/dL (ref 30.0–36.0)
MCV: 97.5 fL (ref 78.0–100.0)
RDW: 14.4 % (ref 11.5–15.5)

## 2010-08-19 LAB — PROTIME-INR: INR: 0.97 (ref 0.00–1.49)

## 2010-08-20 ENCOUNTER — Observation Stay (HOSPITAL_COMMUNITY): Payer: Medicare Other

## 2010-08-20 DIAGNOSIS — I059 Rheumatic mitral valve disease, unspecified: Secondary | ICD-10-CM

## 2010-08-20 LAB — HEMOGLOBIN A1C: Mean Plasma Glucose: 146 mg/dL — ABNORMAL HIGH (ref <117)

## 2010-08-20 LAB — CBC
HCT: 32.9 % — ABNORMAL LOW (ref 36.0–46.0)
MCH: 32 pg (ref 26.0–34.0)
MCHC: 33.1 g/dL (ref 30.0–36.0)
MCV: 96.5 fL (ref 78.0–100.0)
RDW: 14.5 % (ref 11.5–15.5)

## 2010-08-20 LAB — LIPID PANEL
HDL: 38 mg/dL — ABNORMAL LOW (ref >39)
LDL Cholesterol: 33 mg/dL (ref 0–99)
Triglycerides: 212 mg/dL — ABNORMAL HIGH (ref <150)

## 2010-08-20 LAB — COMPREHENSIVE METABOLIC PANEL
CO2: 23 mEq/L (ref 19–32)
Calcium: 8.5 mg/dL (ref 8.4–10.5)
Chloride: 103 mEq/L (ref 96–112)
Creatinine, Ser: 4.61 mg/dL — ABNORMAL HIGH (ref 0.4–1.2)
GFR calc non Af Amer: 10 mL/min — ABNORMAL LOW (ref >60)
Glucose, Bld: 155 mg/dL — ABNORMAL HIGH (ref 70–99)
Total Bilirubin: 0.2 mg/dL — ABNORMAL LOW (ref 0.3–1.2)

## 2010-08-20 LAB — PHOSPHORUS: Phosphorus: 5.1 mg/dL — ABNORMAL HIGH (ref 2.3–4.6)

## 2010-08-20 LAB — URINALYSIS, ROUTINE W REFLEX MICROSCOPIC
Bilirubin Urine: NEGATIVE
Nitrite: NEGATIVE
Specific Gravity, Urine: 1.009 (ref 1.005–1.030)
Urobilinogen, UA: 0.2 mg/dL (ref 0.0–1.0)
pH: 7 (ref 5.0–8.0)

## 2010-08-20 LAB — GLUCOSE, CAPILLARY
Glucose-Capillary: 210 mg/dL — ABNORMAL HIGH (ref 70–99)
Glucose-Capillary: 176 mg/dL — ABNORMAL HIGH (ref 70–99)

## 2010-08-20 LAB — URINE MICROSCOPIC-ADD ON

## 2010-08-21 ENCOUNTER — Observation Stay (HOSPITAL_COMMUNITY): Payer: Medicare Other

## 2010-08-21 LAB — CBC
Hemoglobin: 10.9 g/dL — ABNORMAL LOW (ref 12.0–15.0)
MCH: 31.7 pg (ref 26.0–34.0)
MCHC: 33.1 g/dL (ref 30.0–36.0)
Platelets: 224 10*3/uL (ref 150–400)
RDW: 14.3 % (ref 11.5–15.5)

## 2010-08-21 LAB — GLUCOSE, CAPILLARY: Glucose-Capillary: 187 mg/dL — ABNORMAL HIGH (ref 70–99)

## 2010-08-21 LAB — BASIC METABOLIC PANEL
CO2: 24 mEq/L (ref 19–32)
Calcium: 8.3 mg/dL — ABNORMAL LOW (ref 8.4–10.5)
Creatinine, Ser: 4.81 mg/dL — ABNORMAL HIGH (ref 0.4–1.2)
GFR calc Af Amer: 11 mL/min — ABNORMAL LOW (ref >60)
GFR calc non Af Amer: 9 mL/min — ABNORMAL LOW (ref >60)
Sodium: 138 mEq/L (ref 135–145)

## 2010-08-21 LAB — HEPATITIS B SURFACE ANTIGEN: Hepatitis B Surface Ag: NEGATIVE

## 2010-08-22 DIAGNOSIS — I6789 Other cerebrovascular disease: Secondary | ICD-10-CM

## 2010-08-22 LAB — GLUCOSE, CAPILLARY
Glucose-Capillary: 140 mg/dL — ABNORMAL HIGH (ref 70–99)
Glucose-Capillary: 180 mg/dL — ABNORMAL HIGH (ref 70–99)
Glucose-Capillary: 253 mg/dL — ABNORMAL HIGH (ref 70–99)

## 2010-08-23 NOTE — Discharge Summary (Signed)
  NAME:  [name], [name]                ACCOUNT NO.:  [id number]  MEDICAL RECORD NO.:  [id number]           PATIENT TYPE:  O  LOCATION:  3041                         FACILITY:  [facility]  PHYSICIAN:  Sundra Aland, MD      DATE OF BIRTH:  Dec 07, 1945  DATE OF ADMISSION:  08/19/2010 DATE OF DISCHARGE:  08/22/2010                              DISCHARGE SUMMARY   DISCHARGE DIAGNOSES: 1. Acute ischemic left temporal cerebrovascular accident. 2. End-stage renal disease on hemodialysis Tuesday, Thursday, and     Saturday. 3. History of coronary artery disease. 4. Diabetes mellitus type 2. 5. Hypertension.  CONSULTANTS: 1. Dr. Thad Ranger, Neurology. 2. Nephrology. 3. Dr. Scot Dock.  PROCEDURES: 1. MRI of the brain showed small acute nonhemorrhagic infarct     involving the left upper lobe.  MRA of the brain showed     intracranial arteriosclerotic type changes. 2. Contrast CT of the brain, which showed acute intracranial process. 3. Carotid Doppler ultrasound with no evidence of critical stenosis,     transesophageal echo, which showed no evidence of left atrial     appendage thrombus.  DISCHARGE MEDICATIONS:  New medications:  Plavix 75 mg p.o. daily. Discontinued medication:  Aspirin 81 mg - discontinued.  HOSPITAL COURSE:  [name] is a 65 year old African American female with history of renal disease, hypertension, diabetes, hyperlipidemia who is on dialysis Tuesday, Thursday, and Friday, yesterday she presented to the ED with a slurred speech, when she woke up in the morning.  Stroke was suspected.  The patient was fully worked up and __________ had a left temporal ischemic stroke.  She was seen in consult by Neuro who started the patient on Plavix and discontinued her aspirin 81 mg.  She is supposed to continue also on other lifestyle medication including Zocor.  Vital signs are stable.  Blood pressure is 156/60, heart rate from 51-69, respiration is 18, and saturating 96%  on room air.  She, therefore, will be discharged in stable clinical condition.  DISCHARGE DISPOSITION:  Home.  DISCHARGE DIET:  Heart-healthy diet.  DISCHARGE FOLLOWUP:  The patient need to follow up with primary care physician in 1-2 weeks.  She also needs to follow up for dialysis as scheduled.     Sundra Aland, MD     LA/MEDQ  D:  08/22/2010  T:  08/23/2010  Job:  [id number]  Electronically Signed by Sundra Aland MD on 08/23/2010 05:12:12 PM

## 2010-08-24 NOTE — H&P (Signed)
NAME:  [name], [name]                ACCOUNT NO.:  [id number]  MEDICAL RECORD NO.:  [id number]           PATIENT TYPE:  E  LOCATION:  [facility]                         FACILITY:  [facility]  PHYSICIAN:  Eduard Clos, MDDATE OF BIRTH:  08-04-1945  DATE OF ADMISSION:  08/19/2010 DATE OF DISCHARGE:                             HISTORY & PHYSICAL   PRIMARY CARE PHYSICIAN:  Holley Bouche, MD, of [place]  CHIEF COMPLAINT:  Slurred speech.  HISTORY OF PRESENT ILLNESS:  This is a 65 year old female with known history of ESRD on hemodialysis on Tuesday, Thursday, and Saturday; history of CAD status post stenting; hyperlipidemia; and diabetes mellitus type 2; anxiety woke up from sleep today morning at around 6:45 with slurred speech which lasted for half an hour.  The patient had some difficulty expressing and was unable to express for almost half an hour. The symptoms got resolved by themselves and the patient had gone to the church after which her husband persuaded her to come to the ER and the patient came to the ER around 3 o'clock and had a CT head which is negative.  At this time, the patient has been admitted for further workup.  Her symptoms of slurred speech are completely resolved by half an hour. The patient did not have any focal deficit, did not lose function of upper or lower extremities.  The patient does have history of carpal tunnel syndrome and has numbness in the right hand which she states has been there even before and has been there persistent for long time, was diagnosed with carpal tunnel and has also a trigger finger in the right side.  Denies any dizziness or loss of consciousness, any headache. Denies any difficulty swallowing or any visual symptoms as the patient's symptoms have completely resolved before she had come, the patient was not a candidate for tPA.  The patient denies any chest pain, shortness of breath, nausea, vomiting, abdominal pain, dysuria,  discharge, diarrhea, denies any fever, chills, cough, or phlegm.  PAST MEDICAL HISTORY: 1. CAD status post stenting. 2. ESRD, on hemodialysis, on Tuesday, Thursday, and Saturday. 3. Diabetes mellitus, type 2. 4. Hypertension. 5. Hyperlipidemia. 6. Anxiety.  PAST SURGICAL HISTORY: 1. Hysterectomy. 2. Bilateral oophorectomy for ovarian carcinoma. 3. Low back surgery.  MEDICATIONS PRIOR TO ADMISSION: 1. NovoLog Mix 70/30 - 15 units in the morning and 10 units at night. 2. Xanax 1 mg daily as needed for anxiety. 3. Aspirin 81 mg daily. 4. Zocor 40 mg daily. 5. Lopressor 50 mg twice daily.  ALLERGIES:  No known drug allergies.  FAMILY HISTORY:  Significant for coronary artery disease and hypertension.  SOCIAL HISTORY:  The patient is married and lives with her husband, is a full code.  Denies smoking cigarettes, drinking alcohol, or using illegal drugs.  REVIEW OF SYSTEMS:  As per the history of present illness, nothing else significant.  PHYSICAL EXAMINATION:  GENERAL:  The patient was examined at bedside, not in acute distress. VITAL SIGNS:  Blood pressure 160/60, pulse 61 per minute, temperature 98.5, respirations 18 per minute, O2 sat 98%. HEENT:  Anicteric.  No pallor.  There is no facial asymmetry.  Tongue is midline.  PERRLA positive. NECK:  No neck rigidity.  No discharge from ears, eyes, nose, or mouth. CHEST:  Bilateral air entry present.  No rhonchi, no crepitation. HEART:  S1 and S2 heard. ABDOMEN:  Soft, nontender.  Bowel sounds heard. CENTRAL NERVOUS SYSTEM:  The patient is alert, awake, and oriented to time, place, and person.  He is moving upper and lower extremities 5/5. Facial features are explained in the HEENT.  There is no pronator drift. No dysdiadochokinesia or ataxia. EXTREMITIES:  Peripheral pulses felt.  No edema.  LABORATORY DATA:  EKG shows normal sinus rhythm with a heart rate around 67 beats per minute with ST-T changes comparable to old  EKG.  The patient does have poor R-wave progression. CT of the head without contrast shows no acute intracranial abnormality. CBC:  WBC is 5.9, hemoglobin is 11.4, hematocrit is 34.7, platelets 229. PT/INR 13.1 and 0.9.  Basic metabolic panel:  Sodium 139, potassium 4.6, chloride 103, carbon dioxide 24, glucose 167, BUN 41, creatinine 4.5, total bilirubin is 0.2, alkaline phosphatase 79, AST 15, ALT 13, total protein 7.2, albumin 3.3, calcium 8.7.  CK 125, CK-MB 4.4, relative index is 3.5, troponin less than 0.3.  ASSESSMENT: 1. Possible transient ischemic attack. 2. End-stage renal disease, on hemodialysis on Tuesday, Thursday, and     Saturday. 3. History of coronary artery disease status post stenting. 4. History of hypertension. 5. History of diabetes mellitus, type 2. 6. History of ovarian carcinoma status post hysterectomy and bilateral     oophorectomy.  PLAN: 1. At this time, I will admit the patient to telemetry. 2. For her possible TIA, at this time we will place the patient on     neuro checks.  The patient has already had a swallow.  We will keep     the patient on aspirin, and we will get MRI of the brain, MRA of     the brain, 2-D echo,     and carotid Doppler. 3. For her hypertension, diabetes, and hyperlipidemia, we will     continue present medications. 4. Further recommendation as condition evolves and based on test     orders.     Eduard Clos, MD     ANK/MEDQ  D:  08/19/2010  T:  08/19/2010  Job:  [id number]  cc:   Holley Bouche, M.D.  Electronically Signed by Midge Minium MD on 08/24/2010 07:43:20 AM

## 2010-08-31 NOTE — Consult Note (Signed)
NAME:  [name], [name]                ACCOUNT NO.:  [id number]  MEDICAL RECORD NO.:  [id number]           PATIENT TYPE:  O  LOCATION:  3041                         FACILITY:  [facility]  PHYSICIAN:  Thana Farr, MD    DATE OF BIRTH:  06-15-1945  DATE OF CONSULTATION:  08/20/2010 DATE OF DISCHARGE:                                CONSULTATION   CHIEF COMPLAINT:  Slurred speech.  HISTORY OF PRESENT ILLNESS:  The patient is a 65 year old female with history of end-stage renal disease, on hemodialysis, coronary artery disease status post stenting, hypertension, diabetes, who presented to [facility] with main concern of slurred speech.  This started approximately 6:45 a.m. yesterday when she woke up.  The patient reports not being able to express herself for about half an hour.  Her symptoms resolved and she was able to go to church.  The patient denies any fevers or chills, nausea, or vomiting, dizziness or visual changes.  No weakness or numbness.  Her symptoms are completely resolved for now.  PAST MEDICAL HISTORY:  Hypertension, diabetes, hyperlipidemia, chronic kidney disease, on hemodialysis, coronary artery disease.  MEDICATIONS:  Aspirin 81 mg tablet at home, metoprolol, Zocor.  ALLERGIES:  NKDA.  FAMILY HISTORY:  Hypertension.  SOCIAL HISTORY:  Lives at home with her husband.  Does not smoke, uses alcohol, or illicit drug.  REVIEW OF SYMPTOMS:  Slurred speech.  Otherwise, per HPI.  PHYSICAL EXAMINATION:  VITAL SIGNS:  Blood pressure 165/65, pulse 58, respirations 20, temperature 98.2, oxygen saturation 96%. NEUROLOGIC:  Mental status, the patient is alert and oriented x3. Follows commands appropriately.  Speech is fluent and clear.  No dysarthria or aphasia noted. CRANIAL NERVES:  II - Visual fields full, disk flat bilaterally.  III, IV, VI - EOMI.  V, VII - Sensation intact in facial area bilaterally, face is symmetric, no facial droop noted.  VIII - Hearing intact.   IX, X, XI, XII - Gag present, shoulder shrug intact bilaterally, uvula and tongue midline. MOTOR:  Strength 5/5 in upper and lower extremities bilaterally. SENSATION:  Intact to light touch and pinprick bilaterally throughout. COORDINATION:  Finger-to-nose intact bilaterally as well as heel-to- shin.  Fine motor movements are intact.  No ataxia.  No drift. GAIT:  Normal, but slow. DTR:  +2 throughout, plantars mute.  LABS:  Hemoglobin A1C 6.7.  LFTs within normal limits.  Urinalysis negative.  Sodium 138, potassium 4.3, bicarb 103, chloride 23, BUN 45, creatinine 4.61, glucose 155.  WBC 5, hemoglobin 10.9, hematocrit 32.9, platelets 227.  IMAGING:  MRI of the head, May 28, small acute nonhemorrhagic infarct involving the posterior left temporal lobe, intracranial atherosclerotic type changes on MRI.  ASSESSMENT:  The patient is a 65 year old female with end-stage renal disease, on hemodialysis, hypertension, hyperlipidemia, diabetes, and coronary artery disease, who presents with sudden onset of slurred speech.  CT of the head is negative, brain MRI is consistent with acute small infarction in posterior temporal lobe.  Physical exam findings are grossly intact with no focal neurologic deficits.  The patient is not a t-PA candidate due to resolution of her symptoms.  PLAN: 1. Discontinue aspirin and start Plavix 75 mg tablet by mouth once     daily. 2. Follow up on 2D-echo and carotid Dopplers. 3. Continue Zocor.  Diagnosis and plan as well as imaging tests were discussed with patient and husband who was present at bedside.  Over 30 minutes was spend on consulting the patient.          ______________________________ Thana Farr, MD     LR/MEDQ  D:  08/20/2010  T:  08/21/2010  Job:  [id number]  Electronically Signed by Thana Farr MD on 08/31/2010 02:18:32 PM

## 2010-09-04 ENCOUNTER — Other Ambulatory Visit: Payer: Self-pay | Admitting: *Deleted

## 2010-09-04 MED ORDER — METOPROLOL SUCCINATE ER 50 MG PO TB24: 50.0000 mg | ORAL_TABLET | Freq: Two times a day (BID) | ORAL | Status: DC

## 2010-09-04 MED ORDER — SIMVASTATIN 40 MG PO TABS: 40.0000 mg | ORAL_TABLET | Freq: Every evening | ORAL | Status: DC

## 2010-09-11 NOTE — Consult Note (Signed)
NAME:  [name], [name]                ACCOUNT NO.:  [id number]  MEDICAL RECORD NO.:  [id number]           PATIENT TYPE:  O  LOCATION:  3041                         FACILITY:  [facility]  PHYSICIAN:  Dyke Maes, M.D.DATE OF BIRTH:  01-21-1946  DATE OF CONSULTATION:  08/21/2010 DATE OF DISCHARGE:                                CONSULTATION   REQUESTING PHYSICIAN:  [facility].  PRIMARY CARE PHYSICIAN:  Holley Bouche, MD, [facility]  PRIMARY NEPHROLOGIST:  Jorja Loa, MD, [facility], [place]  REASON FOR CONSULTATION:  Need for hemodialysis in an end-stage renal disease patient.  HISTORY OF PRESENT ILLNESS:  This is a 65 year old woman with history of end-stage renal disease on hemodialysis Tuesday, Thursday, Saturday; coronary artery disease status post PCI, diabetes admitted with slurred speech.  MRI showed an acute small infarct in the posterior temporal lobe.  Renal was consulted for need for hemodialysis.  The patient does not have any complaint at this time.  She had slurred speech on the morning of the day of admission which lasted for about few minutes before it resolved.  She also had some right arm weakness and numbness which was persistent at the time of admission but resolved soon after admission.  She does not have any neurological deficits at this time. She does not have any other complaints of focal numbness, weakness, syncope, or headache.  She also does not have any complaints of shortness of breath, leg swelling, nausea, or vomiting at this time. She says she was dialyzed on Saturday.  She does not have any complaints with dialysis.  Her blood pressure holds up well during the dialysis sessions.  She does not have any nausea, vomiting, pain, or any other complaints at the time of dialysis.  ALLERGIES:  She is not allergic to any medication she is aware of.  PAST MEDICAL HISTORY: 1. End-stage renal disease, on hemodialysis  Tuesday, Thursday,     Saturday at [facility] in [place] since 2009. 2. Congenitally absent left kidney. 3. Coronary artery disease status post drug-eluting stent in 2008. 4. Hypertension. 5. Hyperlipidemia. 6. Diabetes mellitus type 2. 7. Anxiety disorder. 8. History of ovarian cancer status post total abdominal hysterectomy     and bilateral salpingo-oophorectomy. 9. History of low back surgery for degenerative joint disease.  FAMILY HISTORY:  Significant for coronary artery disease and hypertension.  She is not aware of anybody in the family having kidney problems.  SOCIAL HISTORY:  She is married, lives with her husband in [place]. No smoking, alcohol, or drug abuse.  REVIEW OF SYSTEMS:  Comprehensive review of system is negative except as per HPI.  INPATIENT MEDICATIONS: 1. Sliding scale insulin. 2. Zocor 40 mg daily. 3. Lopressor 40 mg twice a day. 4. Plavix 75 mg daily. 5. Ativan, Xanax, Tylenol p.r.n. 6. Renvela 800 mg 2 tablets daily.  OUTPATIENT MEDICATIONS:  See medication reconciliation.  LABORATORY DATA:  Today, sodium 138, potassium 5, chloride 103, bicarb 24, BUN 63, creatinine 4.81, glucose 172, calcium 8.3, phosphorus 5.1, magnesium 2.2.  HbA1c 6.7.  LDL 33.  White count 5, hemoglobin 10.9, hematocrit  32.9, and platelet count 227.  PHYSICAL EXAMINATION:  VITAL SIGNS:  Temperature 98.4, pulse 58-61, respirations 18-20, blood pressure 140-160/60-80, oxygen saturation 94% on room air, and weight 78.7 kg.  Her estimated dry weight is 76.5 kg. Urine output is 200 mL today which was 250 mL yesterday. GENERAL:  Lying in bed, no acute distress. CVS:  S1-S2 normal. RESPIRATION:  Bilaterally clear. ABDOMEN:  Soft, nontender, and nondistended.  Normal bowel sounds. EXTREMITIES:  Right AV fistula with good thrill.  No edema. NEUROLOGIC:  Nonfocal.  Cranial nerves II-XII intact.  Sensation and motor strength intact.  Alert and oriented x3.  ASSESSMENT  AND PLAN: 1. End-stage renal disease, on hemodialysis.  The patient's volume     status and electrolytes look okay at this time.  She is oliguric.     We will check a hepatitis panel and start hemodialysis today.  Her     last hemodialysis was on Saturday.  We will try to remove volume     from her to reach her estimated dry weight.  She will receive her     hemodialysis as per her regular schedule up to Saturday while in     the hospital.  She has good access to the right arteriovenous     fistula. 2. Acute ischemic stroke.  The patient is on secondary prevention now     as per the neurological team.  She is supposed to have a TEE done     tomorrow.  She does not have any focal neurological deficits at     this time. 3. Hypertension. 4. Diabetes. 5. Hyperlipidemia. 6. Coronary artery disease.  Thank you for this consult.  We will follow the patient along in the hospital.  She will receive her hemodialysis as per her regular schedule without any change.     Bethel Born, MD   ______________________________ Dyke Maes, M.D.    MD/MEDQ  D:  08/21/2010  T:  08/22/2010  Job:  [id number]  Electronically Signed by Bethel Born  on 08/25/2010 04:04:46 PM Electronically Signed by Primitivo Gauze M.D. on 09/11/2010 08:01:48 PM

## 2010-09-20 ENCOUNTER — Ambulatory Visit (INDEPENDENT_AMBULATORY_CARE_PROVIDER_SITE_OTHER): Payer: Medicare Other | Admitting: Surgery

## 2010-09-20 ENCOUNTER — Encounter (INDEPENDENT_AMBULATORY_CARE_PROVIDER_SITE_OTHER): Payer: Self-pay | Admitting: Surgery

## 2010-09-20 VITALS — BP 127/83 | HR 81 | Temp 97.8°F | Ht 65.0 in | Wt 166.4 lb

## 2010-09-20 DIAGNOSIS — N63 Unspecified lump in unspecified breast: Secondary | ICD-10-CM

## 2010-09-20 NOTE — Progress Notes (Signed)
Subjective:     Patient ID: [name], female   DOB: [birth year], 65 y.o.   MRN: [id number]    BP 127/83  Pulse 81  Temp(Src) 97.8 F (36.6 C) (Temporal)  Ht 5\' 5"  (1.651 m)  Wt 166 lb 6.4 oz (75.479 kg)  BMI 27.69 kg/m2    HPI The patient is sent at the request of Dr. Beather Arbour due to a right breast mass. This was detected on routine physical examination by Dr. Nicholas Lose. She has a history of fibrocystic breast disease. She denies any breast pain, breast mass, or nipple discharge. Her most recent mammogram in April 2011 was read as a normal mammogram. This has persisted on her physical examination and Dr. Julius Bowels for consultation.  Review of Systems  Constitutional: Negative.   HENT: Negative.   Eyes: Negative.   Respiratory: Negative.   Cardiovascular: Negative.   Gastrointestinal: Negative.   Genitourinary: Negative.   Skin: Negative.   Neurological: Negative.   Hematological: Negative.   Psychiatric/Behavioral: Negative.        Objective:   Physical Exam  Constitutional: She is oriented to person, place, and time. She appears well-developed and well-nourished.  HENT:  Head: Normocephalic and atraumatic.  Neck: Normal range of motion. Neck supple.  Cardiovascular: Normal rate, regular rhythm and normal heart sounds.  Exam reveals no gallop and no friction rub.   No murmur heard. Pulmonary/Chest: Effort normal and breath sounds normal. She has no wheezes. She has no rales.       Both breasts examined.  No dominant mass noted.  Mild thickening under right nipple. No nipple discharge bilaterally.  Musculoskeletal: Normal range of motion.  Lymphadenopathy:    She has no cervical adenopathy.  Neurological: She is alert and oriented to person, place, and time.  Skin: Skin is warm and dry.       Assessment:   Fibrocystic breast disease. Plan:  The patient has changes of fibrocystic breast disease bilaterally. I have reviewed her most recent mammogram from April  2012 and this is read as abnormal mammogram. On examination she has some mild thickness and changes consistent with fibrocystic change. There is no dominant mass. I will see her back as needed and have recommended routine screening mammography and self breast examinations to her.

## 2010-09-20 NOTE — Patient Instructions (Signed)
Follow up as needed.  The area in the breast is normal tissue.

## 2010-11-09 ENCOUNTER — Ambulatory Visit: Payer: Medicare Other | Admitting: Cardiovascular Disease

## 2010-12-14 LAB — RENAL FUNCTION PANEL
BUN: 68 — ABNORMAL HIGH
CO2: 21
Chloride: 111
Glucose, Bld: 100 — ABNORMAL HIGH
Phosphorus: 5.7 — ABNORMAL HIGH
Potassium: 4
Sodium: 142

## 2010-12-17 LAB — RENAL FUNCTION PANEL
Albumin: 3.5
BUN: 85 — ABNORMAL HIGH
Chloride: 107
GFR calc Af Amer: 18 — ABNORMAL LOW
GFR calc non Af Amer: 15 — ABNORMAL LOW
Phosphorus: 6.3 — ABNORMAL HIGH
Potassium: 4.7
Sodium: 138

## 2010-12-17 LAB — HEMOGLOBIN AND HEMATOCRIT, BLOOD
HCT: 27.6 — ABNORMAL LOW
Hemoglobin: 9.5 — ABNORMAL LOW

## 2010-12-18 LAB — HEMOGLOBIN AND HEMATOCRIT, BLOOD: HCT: 28.7 — ABNORMAL LOW

## 2010-12-20 LAB — RENAL FUNCTION PANEL
BUN: 148 — ABNORMAL HIGH
CO2: 29
Calcium: 8.2 — ABNORMAL LOW
Glucose, Bld: 157 — ABNORMAL HIGH
Sodium: 139

## 2010-12-28 LAB — BASIC METABOLIC PANEL
BUN: 67 — ABNORMAL HIGH
CO2: 17 — ABNORMAL LOW
CO2: 19
Chloride: 109
Chloride: 116 — ABNORMAL HIGH
Creatinine, Ser: 3.61 — ABNORMAL HIGH
GFR calc Af Amer: 15 — ABNORMAL LOW
Glucose, Bld: 157 — ABNORMAL HIGH
Glucose, Bld: 88
Potassium: 3.9
Sodium: 139

## 2010-12-28 LAB — CROSSMATCH

## 2010-12-28 LAB — CBC
Hemoglobin: 8.2 — ABNORMAL LOW
MCHC: 35
MCV: 90.1
RBC: 2.61 — ABNORMAL LOW

## 2010-12-31 ENCOUNTER — Ambulatory Visit: Payer: Medicare Other | Admitting: Cardiovascular Disease

## 2010-12-31 LAB — BASIC METABOLIC PANEL
BUN: 63 — ABNORMAL HIGH
BUN: 74 — ABNORMAL HIGH
CO2: 18 — ABNORMAL LOW
Calcium: 7.9 — ABNORMAL LOW
Chloride: 112
Creatinine, Ser: 3.8 — ABNORMAL HIGH
GFR calc Af Amer: 18 — ABNORMAL LOW
GFR calc non Af Amer: 12 — ABNORMAL LOW
GFR calc non Af Amer: 15 — ABNORMAL LOW
GFR calc non Af Amer: 17 — ABNORMAL LOW
Glucose, Bld: 276 — ABNORMAL HIGH
Glucose, Bld: 78
Potassium: 3.7
Potassium: 3.8
Potassium: 4.2
Sodium: 138
Sodium: 140

## 2010-12-31 LAB — CREATININE, URINE, RANDOM: Creatinine, Urine: 87.7

## 2010-12-31 LAB — URINE CULTURE
Colony Count: NO GROWTH
Culture: NO GROWTH

## 2010-12-31 LAB — URINALYSIS, ROUTINE W REFLEX MICROSCOPIC
Ketones, ur: NEGATIVE
Nitrite: NEGATIVE
Protein, ur: 30 — AB
Urobilinogen, UA: 0.2

## 2010-12-31 LAB — RENAL FUNCTION PANEL
Calcium: 8.4
GFR calc Af Amer: 15 — ABNORMAL LOW
GFR calc non Af Amer: 12 — ABNORMAL LOW
Phosphorus: 5 — ABNORMAL HIGH
Potassium: 3.9
Sodium: 135

## 2010-12-31 LAB — POCT I-STAT GLUCOSE
Glucose, Bld: 182 — ABNORMAL HIGH
Glucose, Bld: 212 — ABNORMAL HIGH
Operator id: 125961
Operator id: 125961
Operator id: 125961

## 2010-12-31 LAB — BASIC METABOLIC PANEL WITH GFR
BUN: 77 — ABNORMAL HIGH
CO2: 21
Calcium: 9.5
Chloride: 110
Creatinine, Ser: 3.05 — ABNORMAL HIGH
GFR calc non Af Amer: 15 — ABNORMAL LOW
Glucose, Bld: 50 — ABNORMAL LOW
Potassium: 4.2
Sodium: 141

## 2010-12-31 LAB — CBC
HCT: 29.4 — ABNORMAL LOW
HCT: 33.1 — ABNORMAL LOW
Hemoglobin: 11.7 — ABNORMAL LOW
MCHC: 34.4
Platelets: 270
RBC: 3.09 — ABNORMAL LOW
RDW: 12.6
RDW: 13.8
RDW: 14.1

## 2010-12-31 LAB — CROSSMATCH

## 2010-12-31 LAB — HEMOGLOBIN A1C: Mean Plasma Glucose: 147

## 2010-12-31 LAB — HEMOGLOBIN AND HEMATOCRIT, BLOOD
HCT: 25.2 — ABNORMAL LOW
Hemoglobin: 8.7 — ABNORMAL LOW

## 2010-12-31 LAB — ABO/RH: ABO/RH(D): B POS

## 2011-01-02 ENCOUNTER — Ambulatory Visit: Payer: Medicare Other | Admitting: Cardiovascular Disease

## 2011-01-02 LAB — DIFFERENTIAL
Basophils Relative: 0
Basophils Relative: 1
Eosinophils Absolute: 0
Eosinophils Absolute: 0.2
Eosinophils Relative: 0
Eosinophils Relative: 1
Eosinophils Relative: 5
Lymphs Abs: 1.7
Monocytes Absolute: 0.2
Monocytes Relative: 11
Monocytes Relative: 2 — ABNORMAL LOW
Monocytes Relative: 5
Neutro Abs: 8.6 — ABNORMAL HIGH
Neutrophils Relative %: 46
Neutrophils Relative %: 83 — ABNORMAL HIGH

## 2011-01-02 LAB — COMPREHENSIVE METABOLIC PANEL
ALT: 13
Albumin: 3.9
Alkaline Phosphatase: 52
Glucose, Bld: 248 — ABNORMAL HIGH
Potassium: 4.9
Sodium: 142
Total Protein: 7.6

## 2011-01-02 LAB — BASIC METABOLIC PANEL
BUN: 58 — ABNORMAL HIGH
CO2: 26
CO2: 28
Calcium: 8.2 — ABNORMAL LOW
Chloride: 107
Creatinine, Ser: 3.86 — ABNORMAL HIGH
Creatinine, Ser: 4.48 — ABNORMAL HIGH
Glucose, Bld: 155 — ABNORMAL HIGH
Potassium: 3.9

## 2011-01-02 LAB — CBC
HCT: 29.9 — ABNORMAL LOW
Hemoglobin: 13.1
MCHC: 34.4
MCHC: 34.8
MCV: 90.9
Platelets: 259
Platelets: 340
RBC: 3.29 — ABNORMAL LOW
RDW: 12.7
RDW: 12.9

## 2011-01-02 LAB — PTH, INTACT AND CALCIUM: PTH: 76.1 — ABNORMAL HIGH

## 2011-01-02 LAB — CULTURE, BLOOD (ROUTINE X 2): Report Status: 10242008

## 2011-01-03 LAB — COMPREHENSIVE METABOLIC PANEL
BUN: 68 — ABNORMAL HIGH
CO2: 23
Calcium: 9
Creatinine, Ser: 3.9 — ABNORMAL HIGH
GFR calc non Af Amer: 12 — ABNORMAL LOW
Glucose, Bld: 273 — ABNORMAL HIGH
Sodium: 137
Total Protein: 7.2

## 2011-01-03 LAB — URINALYSIS, ROUTINE W REFLEX MICROSCOPIC
Glucose, UA: 250 — AB
Ketones, ur: NEGATIVE
Leukocytes, UA: NEGATIVE
Nitrite: NEGATIVE
Specific Gravity, Urine: 1.02
pH: 5.5

## 2011-01-03 LAB — CBC
Hemoglobin: 13.6
MCHC: 34.5
MCV: 90.5
RBC: 4.37
RDW: 13

## 2011-01-03 LAB — DIFFERENTIAL
Eosinophils Absolute: 0.1
Lymphocytes Relative: 10 — ABNORMAL LOW
Lymphs Abs: 1.4
Monocytes Relative: 2 — ABNORMAL LOW
Neutro Abs: 11.5 — ABNORMAL HIGH
Neutrophils Relative %: 87 — ABNORMAL HIGH

## 2011-01-03 LAB — URINE MICROSCOPIC-ADD ON

## 2011-01-03 LAB — LIPASE, BLOOD: Lipase: 59

## 2011-01-04 LAB — POCT CARDIAC MARKERS
Myoglobin, poc: 265
Operator id: 151321

## 2011-01-04 LAB — DIFFERENTIAL
Basophils Absolute: 0
Basophils Relative: 1
Neutro Abs: 6.5
Neutrophils Relative %: 74

## 2011-01-04 LAB — CBC
HCT: 38
Hemoglobin: 13.1
MCV: 89.2
WBC: 8.8

## 2011-01-04 LAB — COMPREHENSIVE METABOLIC PANEL
Alkaline Phosphatase: 45
BUN: 73 — ABNORMAL HIGH
Chloride: 103
GFR calc non Af Amer: 13 — ABNORMAL LOW
Glucose, Bld: 137 — ABNORMAL HIGH
Potassium: 4.8
Total Bilirubin: 0.6

## 2011-01-04 LAB — LIPASE, BLOOD: Lipase: 81 — ABNORMAL HIGH

## 2011-01-04 LAB — URINALYSIS, ROUTINE W REFLEX MICROSCOPIC
Bilirubin Urine: NEGATIVE
Nitrite: NEGATIVE
Protein, ur: 100 — AB
Urobilinogen, UA: 0.2

## 2011-01-04 LAB — URINE MICROSCOPIC-ADD ON

## 2011-01-07 LAB — RENAL FUNCTION PANEL
Albumin: 3.7
BUN: 73 — ABNORMAL HIGH
Chloride: 112
Glucose, Bld: 164 — ABNORMAL HIGH
Potassium: 4.4

## 2011-01-07 LAB — HEMOGLOBIN AND HEMATOCRIT, BLOOD: HCT: 36.9

## 2011-01-08 LAB — HEMOGLOBIN AND HEMATOCRIT, BLOOD: HCT: 36.6

## 2011-01-08 LAB — RENAL FUNCTION PANEL
Albumin: 3.5
Calcium: 9.3
Chloride: 108
Creatinine, Ser: 3.44 — ABNORMAL HIGH
GFR calc Af Amer: 16 — ABNORMAL LOW
GFR calc non Af Amer: 14 — ABNORMAL LOW

## 2011-01-10 LAB — RENAL FUNCTION PANEL
Albumin: 3.7
BUN: 77 — ABNORMAL HIGH
Creatinine, Ser: 3.77 — ABNORMAL HIGH
Phosphorus: 6.7 — ABNORMAL HIGH
Potassium: 4.4

## 2011-01-15 ENCOUNTER — Encounter: Payer: Self-pay | Admitting: Cardiovascular Disease

## 2011-01-16 ENCOUNTER — Ambulatory Visit (INDEPENDENT_AMBULATORY_CARE_PROVIDER_SITE_OTHER): Payer: Medicare Other | Admitting: Cardiovascular Disease

## 2011-01-16 ENCOUNTER — Encounter: Payer: Self-pay | Admitting: *Deleted

## 2011-01-16 ENCOUNTER — Encounter: Payer: Self-pay | Admitting: Cardiovascular Disease

## 2011-01-16 VITALS — BP 167/69 | HR 55 | Ht 65.0 in | Wt 171.0 lb

## 2011-01-16 DIAGNOSIS — I2510 Atherosclerotic heart disease of native coronary artery without angina pectoris: Secondary | ICD-10-CM

## 2011-01-16 DIAGNOSIS — I10 Essential (primary) hypertension: Secondary | ICD-10-CM

## 2011-01-16 MED ORDER — METOPROLOL SUCCINATE ER 50 MG PO TB24: 50.0000 mg | ORAL_TABLET | Freq: Two times a day (BID) | ORAL | Status: DC

## 2011-01-16 NOTE — Assessment & Plan Note (Signed)
Stable. Continue ASA, statin and beta blocker.

## 2011-01-16 NOTE — Progress Notes (Signed)
History of Present Illness:65 yo female with history of CAD, ESRD on HD, HTN, HLD, DM, TIA  here today for cardiac follow up. She has been followed in the past by Dr. Juanda Chance.   She has end-stage renal disease and is on chronic hemodialysis in [place]. She has CAD and had a drug eluting stent placed in  the circumflex artery in 2008. She had total occlusion of the LAD at that time. An echocardiogram done in 2010 showed an ejection fraction of 55%. TIA summer 2012 with expressive aphasia and complete resolution.   She's had no recent chest pain, shortness of breath or palpitations.   Primary care is Holley Bouche with [facility]. Last cholesterol profile with total chol 113, HDL 38, LDL 33.   Nephrology is [facility] in [place].    Past Medical History  Diagnosis Date  . Diabetes mellitus   . Stroke   . Hypertension   . Hyperlipidemia   . Coronary artery disease   . Ovarian cancer     Past Surgical History  Procedure Date  . Abdominal hysterectomy     1995  . Back surgery     2009    Current Outpatient Prescriptions  Medication Sig Dispense Refill  . ALPRAZolam (XANAX) 0.25 MG tablet Take 0.25 mg by mouth at bedtime as needed.        Marland Kitchen aspirin 81 MG tablet Take 81 mg by mouth daily.        . insulin NPH-insulin regular (NOVOLIN 70/30) (70-30) 100 UNIT/ML injection Inject into the skin 2 (two) times daily. 70/30       . metoprolol (TOPROL XL) 50 MG 24 hr tablet Take 1 tablet (50 mg total) by mouth 2 (two) times daily.  60 tablet  11  . simvastatin (ZOCOR) 40 MG tablet Take 1 tablet (40 mg total) by mouth every evening.  30 tablet  11    No Known Allergies  History   Social History  . Marital Status: Married    Spouse Name: N/A    Number of Children: N/A  . Years of Education: N/A   Occupational History  . Not on file.   Social History Main Topics  . Smoking status: Never Smoker   . Smokeless tobacco: Never Used  . Alcohol Use: No  . Drug Use:   .  Sexually Active:    Other Topics Concern  . Not on file   Social History Narrative  . No narrative on file    No family history on file.  Review of Systems:  As stated in the HPI and otherwise negative.   BP 167/69  Pulse 55  Ht 5\' 5"  (1.651 m)  Wt 171 lb (77.565 kg)  BMI 28.46 kg/m2  Physical Examination: General: Well developed, well nourished, NAD HEENT: OP clear, mucus membranes moist SKIN: warm, dry. No rashes. Neuro: No focal deficits Musculoskeletal: Muscle strength 5/5 all ext Psychiatric: Mood and affect normal Neck: No JVD, no carotid bruits, no thyromegaly, no lymphadenopathy. Lungs:Clear bilaterally, no wheezes, rhonci, crackles Cardiovascular: Regular rate and rhythm. No murmurs, gallops or rubs. Abdomen:Soft. Bowel sounds present. Non-tender.  Extremities: No lower extremity edema. Pulses are 2 + in the bilateral DP/PT.  NWG:NFAOZ bradycardia, 55 bpm. PACs. Old inferior infarct. Poor R wave progression precordial leads.

## 2011-01-16 NOTE — Assessment & Plan Note (Signed)
BP is elevated today but has been ok in dialysis. No changes.

## 2011-01-16 NOTE — Patient Instructions (Signed)
Your physician wants you to follow-up in:  12 months.  You will receive a reminder letter in the mail two months in advance. If you don't receive a letter, please call our office to schedule the follow-up appointment.   

## 2011-01-23 ENCOUNTER — Encounter: Payer: Self-pay | Admitting: *Deleted

## 2011-01-23 ENCOUNTER — Telehealth: Payer: Self-pay | Admitting: Cardiovascular Disease

## 2011-01-23 NOTE — Telephone Encounter (Signed)
Patient calling need a  Letter stating she was at her appt on 10/29. Due to Kidney transplant.

## 2011-01-23 NOTE — Telephone Encounter (Signed)
Pt rtn your call/lg °

## 2011-01-23 NOTE — Telephone Encounter (Signed)
lm

## 2011-01-23 NOTE — Telephone Encounter (Signed)
Returning call re: letter. States she was here 10/24. Will send her letter stating she was in office that day.

## 2011-03-22 ENCOUNTER — Telehealth: Payer: Self-pay | Admitting: Cardiovascular Disease

## 2011-03-22 DIAGNOSIS — Z01810 Encounter for preprocedural cardiovascular examination: Secondary | ICD-10-CM

## 2011-03-22 DIAGNOSIS — I2510 Atherosclerotic heart disease of native coronary artery without angina pectoris: Secondary | ICD-10-CM

## 2011-03-22 NOTE — Telephone Encounter (Signed)
Orders for testing placed and returned to scheduler to call pt and schedule

## 2011-03-22 NOTE — Telephone Encounter (Signed)
Left message on pt mobile phone, will get the okay for testing from dr Clifton James and then call the pt to schedule

## 2011-03-22 NOTE — Telephone Encounter (Signed)
Pt having a kidney transplant and needs testing done which she's requesting be done here, needs stress test, carotid and and iliac [place], can she get an order?

## 2011-03-22 NOTE — Telephone Encounter (Signed)
Ok with me. Thanks, cdm

## 2011-04-05 ENCOUNTER — Telehealth: Payer: Self-pay | Admitting: *Deleted

## 2011-04-09 ENCOUNTER — Telehealth: Payer: Self-pay | Admitting: *Deleted

## 2011-04-09 NOTE — Telephone Encounter (Signed)
error 

## 2011-04-15 ENCOUNTER — Ambulatory Visit (HOSPITAL_COMMUNITY): Payer: Medicare Other | Attending: Cardiovascular Disease | Admitting: Radiology

## 2011-04-15 DIAGNOSIS — I2510 Atherosclerotic heart disease of native coronary artery without angina pectoris: Secondary | ICD-10-CM

## 2011-04-15 DIAGNOSIS — I509 Heart failure, unspecified: Secondary | ICD-10-CM | POA: Insufficient documentation

## 2011-04-15 DIAGNOSIS — I10 Essential (primary) hypertension: Secondary | ICD-10-CM | POA: Insufficient documentation

## 2011-04-15 DIAGNOSIS — Z8673 Personal history of transient ischemic attack (TIA), and cerebral infarction without residual deficits: Secondary | ICD-10-CM | POA: Insufficient documentation

## 2011-04-15 DIAGNOSIS — Z87891 Personal history of nicotine dependence: Secondary | ICD-10-CM | POA: Insufficient documentation

## 2011-04-15 DIAGNOSIS — I5022 Chronic systolic (congestive) heart failure: Secondary | ICD-10-CM

## 2011-04-15 DIAGNOSIS — R0609 Other forms of dyspnea: Secondary | ICD-10-CM | POA: Insufficient documentation

## 2011-04-15 DIAGNOSIS — N189 Chronic kidney disease, unspecified: Secondary | ICD-10-CM

## 2011-04-15 DIAGNOSIS — Z01810 Encounter for preprocedural cardiovascular examination: Secondary | ICD-10-CM

## 2011-04-15 DIAGNOSIS — E119 Type 2 diabetes mellitus without complications: Secondary | ICD-10-CM

## 2011-04-15 DIAGNOSIS — R0989 Other specified symptoms and signs involving the circulatory and respiratory systems: Secondary | ICD-10-CM | POA: Insufficient documentation

## 2011-04-15 DIAGNOSIS — R000 Tachycardia, unspecified: Secondary | ICD-10-CM | POA: Insufficient documentation

## 2011-04-15 DIAGNOSIS — E785 Hyperlipidemia, unspecified: Secondary | ICD-10-CM | POA: Insufficient documentation

## 2011-04-15 DIAGNOSIS — Z794 Long term (current) use of insulin: Secondary | ICD-10-CM | POA: Insufficient documentation

## 2011-04-15 MED ORDER — TECHNETIUM TC 99M TETROFOSMIN IV KIT
11.0000 | PACK | Freq: Once | INTRAVENOUS | Status: AC | PRN
  Administered 2011-04-15: 11 via INTRAVENOUS

## 2011-04-15 MED ORDER — TECHNETIUM TC 99M TETROFOSMIN IV KIT
33.0000 | PACK | Freq: Once | INTRAVENOUS | Status: AC | PRN
  Administered 2011-04-15: 33 via INTRAVENOUS

## 2011-04-15 MED ORDER — REGADENOSON 0.4 MG/5ML IV SOLN
0.4000 mg | Freq: Once | INTRAVENOUS | Status: AC
  Administered 2011-04-15: 0.4 mg via INTRAVENOUS

## 2011-04-15 MED ORDER — AMINOPHYLLINE 25 MG/ML IV SOLN
75.0000 mg | Freq: Once | INTRAVENOUS | Status: AC
  Administered 2011-04-15: 75 mg via INTRAVENOUS

## 2011-04-15 NOTE — Progress Notes (Signed)
[facility] SITE 3 NUCLEAR MED [address] [place] [address] [phone]  Cardiology Nuclear Med Study  [name] is a 66 y.o. female [id number] 1945-05-26   Nuclear Med Background Indication for Stress Test:  Evaluation for Ischemia, Stent Patency and Surgical Clearance Pending Kidney Transplant History:  CHF; 1/08 OZH:YQMVHQ infarct, no ischemia, EF=63%; 2/08 NSTEMI>Stent-CFX, EF=45-50%; '10 Echo:EF=55% Cardiac Risk Factors: CVA/TIA, History of Smoking, Hypertension, IDDM Type 2 and Lipids   Symptoms:  DOE and Rapid HR   Nuclear Pre-Procedure Caffeine/Decaff Intake:  None NPO After: 8:00pm   Lungs:  Clear.  O2 SAT 95% on RA IV 0.9% NS with Angio Cath:  22g  IV Site: L Forearm  IV Started by:  Stanton Kidney, EMT-P  Chest Size (in):  36 Cup Size: B  Height: 5\' 5"  (1.651 m)  Weight:  171 lb (77.565 kg)  BMI:  Body mass index is 28.46 kg/(m^2). Tech Comments:  Toprol held > 16 hours, per patient.       Nuclear Med Study 1 or 2 day study: 1 day  Stress Test Type:  Eugenie Birks  Reading MD: [name] Millers, MD  Order Authorizing Provider:  [name], MD  Resting Radionuclide: Technetium 30m Tetrofosmin  Resting Radionuclide Dose: 11.0 mCi   Stress Radionuclide:  Technetium 64m Tetrofosmin  Stress Radionuclide Dose: 33.0 mCi           Stress Protocol Rest HR: 60 Stress HR: 79  Rest BP: 158/65 Stress BP: 163/75  Exercise Time (min): n/a METS: n/a   Predicted Max HR: 153 bpm % Max HR: 51.63 bpm Rate Pressure Product: 46962   Dose of Adenosine (mg):  n/a Dose of Lexiscan: 0.4 mg Dose of Aminophylline: 75 mg  Dose of Atropine (mg): n/a Dose of Dobutamine: n/a mcg/kg/min (at max HR)  Stress Test Technologist: Smiley Houseman, CMA-N  Nuclear Technologist:  Domenic Polite, CNMT     Rest Procedure:  Myocardial perfusion imaging was performed at rest 45 minutes following the intravenous administration of Technetium 28m Tetrofosmin.  Rest ECG:  Old ASWMI.  Stress Procedure:  The patient received IV Lexiscan 0.4 mg over 15-seconds.  Technetium 88m Tetrofosmin injected at 30-seconds.  There were no significant changes with Lexiscan.  Quantitative spect images were obtained after a 45 minute delay.  Patient had nausea, vomiting and diarrhea after stress images.  Aminophylline 75 mg was given IV with quick resolution of symptoms.  BP 180/100 and 198/82, advised patient to take her Toprol.   Stress ECG: No significant ST segment change suggestive of ischemia.  QPS Raw Data Images:  Acquisition technically good; mild LVE. Stress Images:  There is decreased uptake in the anterolateral wall and apex. Rest Images:  There is decreased uptake in the anterolateral wall and apex. Subtraction (SDS):  There is a fixed defect that is most consistent with a previous infarction. Transient Ischemic Dilatation (Normal <1.22):  1.05 Lung/Heart Ratio (Normal <0.45):  0.38  Quantitative Gated Spect Images QGS EDV:  123 ml QGS ESV:  51 ml QGS cine images:  Hypokinesis of the anterolateral wall. QGS EF: 58%  Impression Exercise Capacity:  Lexiscan with no exercise. BP Response:  Normal blood pressure response. Clinical Symptoms:  No chest pain. ECG Impression:  No significant ST segment change suggestive of ischemia. Comparison with Prior Nuclear Study: No images to compare  Overall Impression:  Abnormal stress nuclear study with a small to moderate size fixed anterolateral and apical defect suggestive of previous infarct;  no ischemia.     [name]

## 2011-04-17 ENCOUNTER — Encounter: Payer: Medicare Other | Admitting: Cardiology

## 2011-04-18 ENCOUNTER — Telehealth: Payer: Self-pay | Admitting: Cardiovascular Disease

## 2011-04-18 NOTE — Telephone Encounter (Signed)
All Cardiac faxed to [place] @ [phone] 04/18/11/km

## 2011-04-22 ENCOUNTER — Encounter (INDEPENDENT_AMBULATORY_CARE_PROVIDER_SITE_OTHER): Payer: Medicare Other | Admitting: Cardiology

## 2011-04-22 DIAGNOSIS — I6529 Occlusion and stenosis of unspecified carotid artery: Secondary | ICD-10-CM

## 2011-04-22 DIAGNOSIS — I2510 Atherosclerotic heart disease of native coronary artery without angina pectoris: Secondary | ICD-10-CM

## 2011-04-22 DIAGNOSIS — G459 Transient cerebral ischemic attack, unspecified: Secondary | ICD-10-CM

## 2011-04-22 DIAGNOSIS — Z01810 Encounter for preprocedural cardiovascular examination: Secondary | ICD-10-CM

## 2011-04-24 ENCOUNTER — Telehealth: Payer: Self-pay | Admitting: Cardiovascular Disease

## 2011-04-24 DIAGNOSIS — I340 Nonrheumatic mitral (valve) insufficiency: Secondary | ICD-10-CM

## 2011-04-24 NOTE — Telephone Encounter (Signed)
Left message to call back  

## 2011-04-24 NOTE — Telephone Encounter (Signed)
They need more info for pt work up

## 2011-04-25 ENCOUNTER — Telehealth: Payer: Self-pay | Admitting: *Deleted

## 2011-04-25 NOTE — Telephone Encounter (Signed)
Left message for patient to call and schedule Aorta/echo. I have been calling the patient since 03/25/11.

## 2011-04-25 NOTE — Telephone Encounter (Signed)
Spoke with [place] regarding work up.  Patient cancelled aorto-iliac duplex, need to have this rescheduled.  Requesting patient have follow up echo since last one in 2010 and patient had severe mitral regurgitation.   She was concerned with this will patient still be cleared for major surgery?    Advised would forward to Dr Clifton James and Dennie Bible RN as well as scheduling to get all of these addressed.

## 2011-04-26 ENCOUNTER — Ambulatory Visit (HOSPITAL_COMMUNITY): Payer: Medicare Other | Attending: Internal Medicine | Admitting: Radiology

## 2011-04-26 DIAGNOSIS — Z01810 Encounter for preprocedural cardiovascular examination: Secondary | ICD-10-CM | POA: Insufficient documentation

## 2011-04-26 DIAGNOSIS — I2510 Atherosclerotic heart disease of native coronary artery without angina pectoris: Secondary | ICD-10-CM | POA: Insufficient documentation

## 2011-04-26 DIAGNOSIS — E119 Type 2 diabetes mellitus without complications: Secondary | ICD-10-CM | POA: Insufficient documentation

## 2011-04-26 DIAGNOSIS — I340 Nonrheumatic mitral (valve) insufficiency: Secondary | ICD-10-CM

## 2011-04-26 DIAGNOSIS — Z8673 Personal history of transient ischemic attack (TIA), and cerebral infarction without residual deficits: Secondary | ICD-10-CM | POA: Insufficient documentation

## 2011-04-26 DIAGNOSIS — E785 Hyperlipidemia, unspecified: Secondary | ICD-10-CM | POA: Insufficient documentation

## 2011-04-26 DIAGNOSIS — Z992 Dependence on renal dialysis: Secondary | ICD-10-CM | POA: Insufficient documentation

## 2011-04-26 DIAGNOSIS — I120 Hypertensive chronic kidney disease with stage 5 chronic kidney disease or end stage renal disease: Secondary | ICD-10-CM | POA: Insufficient documentation

## 2011-04-26 DIAGNOSIS — N186 End stage renal disease: Secondary | ICD-10-CM | POA: Insufficient documentation

## 2011-04-26 DIAGNOSIS — I059 Rheumatic mitral valve disease, unspecified: Secondary | ICD-10-CM | POA: Insufficient documentation

## 2011-04-26 NOTE — Telephone Encounter (Signed)
Pt is scheduled for echo today. Was scheduled for aorto iliac on April 17, 2011 but this was cancelled per pt phone call because she states she had it done at [facility].  I spoke with [place] at [place] and pt has not had aorto iliac done there and it will need to be rescheduled. Is scheduled to be done on Feb. 13, 2013 in our office.  Gershon Cull requests to have echo report faxed to [phone] as soon as available.

## 2011-04-26 NOTE — Telephone Encounter (Signed)
Not sure who is calling. I think repeating an echo would be appropriate and if she was supposed to have aortic u/s that could be rescheduled. What surgery is she having? cdm

## 2011-04-26 NOTE — Telephone Encounter (Signed)
Echo report has been faxed.

## 2011-05-08 ENCOUNTER — Encounter (INDEPENDENT_AMBULATORY_CARE_PROVIDER_SITE_OTHER): Payer: Medicare Other | Admitting: Cardiology

## 2011-05-08 DIAGNOSIS — N185 Chronic kidney disease, stage 5: Secondary | ICD-10-CM

## 2011-05-08 DIAGNOSIS — I2510 Atherosclerotic heart disease of native coronary artery without angina pectoris: Secondary | ICD-10-CM

## 2011-05-08 DIAGNOSIS — Z01810 Encounter for preprocedural cardiovascular examination: Secondary | ICD-10-CM

## 2011-05-15 NOTE — Telephone Encounter (Signed)
Aorto-iliac ultrasound report faxed to [place] at [facility]

## 2011-05-21 ENCOUNTER — Telehealth: Payer: Self-pay

## 2011-05-21 NOTE — Telephone Encounter (Signed)
Spoke with pt and appt made for her to see Dr. Clifton James on May 27, 2011 at 3 :00. I left message on Priscilla's voicemail with this information.

## 2011-05-21 NOTE — Telephone Encounter (Signed)
Pat, Can we add her on for next week? Thanks, chris

## 2011-05-21 NOTE — Telephone Encounter (Signed)
Left a message on the recorder wanting to know if pt has been cleared from a cardiac standpoint for a kidney transplant.

## 2011-05-27 ENCOUNTER — Ambulatory Visit: Payer: Medicare Other | Admitting: Cardiovascular Disease

## 2011-06-10 ENCOUNTER — Telehealth: Payer: Self-pay | Admitting: Cardiovascular Disease

## 2011-06-10 NOTE — Telephone Encounter (Signed)
Patient scheduled to see Dr. Clifton James on 07/03/11 for clearance.

## 2011-06-10 NOTE — Telephone Encounter (Signed)
New problem:  Per Gershon Cull, please advise on cardiac clearance.

## 2011-07-03 ENCOUNTER — Ambulatory Visit: Payer: Medicare Other | Admitting: Cardiovascular Disease

## 2011-07-08 ENCOUNTER — Encounter: Payer: Self-pay | Admitting: Cardiovascular Disease

## 2011-07-08 ENCOUNTER — Ambulatory Visit (INDEPENDENT_AMBULATORY_CARE_PROVIDER_SITE_OTHER): Payer: Medicare Other | Admitting: Cardiovascular Disease

## 2011-07-08 VITALS — BP 180/80 | HR 76 | Ht 65.0 in | Wt 168.0 lb

## 2011-07-08 DIAGNOSIS — I340 Nonrheumatic mitral (valve) insufficiency: Secondary | ICD-10-CM | POA: Insufficient documentation

## 2011-07-08 DIAGNOSIS — I059 Rheumatic mitral valve disease, unspecified: Secondary | ICD-10-CM

## 2011-07-08 DIAGNOSIS — Z01810 Encounter for preprocedural cardiovascular examination: Secondary | ICD-10-CM

## 2011-07-08 DIAGNOSIS — I2510 Atherosclerotic heart disease of native coronary artery without angina pectoris: Secondary | ICD-10-CM

## 2011-07-08 NOTE — Assessment & Plan Note (Signed)
She is on the transplant list at [facility]. She has recently completed a stress myoview which showed anterior wall scar but no ischemia, an echocardiogram which showed normal LV systolic function and moderate MR (stable). Her carotid artery dopplers showed mild bilateral plaque. Abdominal aortic ultrasound with normal caliber aorta without evidence of aneurysm or stenosis. No further cardiac workup is indicated at this time. I would recommend that she proceed with her surgical procedure as planned.

## 2011-07-08 NOTE — Progress Notes (Signed)
History of Present Illness: 66 yo WF with history of CAD, ESRD on HD, HTN, HLD, DM, TIA here today for cardiac follow up. She has been followed in the past by Dr. Juanda Chance. She has end-stage renal disease and is on chronic hemodialysis in [place]. She has CAD and had a drug eluting stent placed in the circumflex artery in 2008. She had total occlusion of the LAD at that time. An echocardiogram done in 2010 showed an ejection fraction of 55%. TIA summer 2012 with expressive aphasia and complete resolution. I last saw her in October 2012.   She's had no recent chest pain, shortness of breath or palpitations. She just got back from [place] today. She has had recent bronchitis. She is trying to get on the kidney transplant list at [facility]. She is feeling well.   Primary Care Physician: Holley Bouche with [facility].  Nephrology: Dr. Kristian Covey in [place].   Last Lipid Profile: May 2012:  total chol 113, HDL 38, LDL 33.   Past Medical History  Diagnosis Date  . Diabetes mellitus   . Stroke   . Hypertension   . Hyperlipidemia   . Coronary artery disease   . Ovarian cancer     Past Surgical History  Procedure Date  . Abdominal hysterectomy     1995  . Back surgery     2009    Current Outpatient Prescriptions  Medication Sig Dispense Refill  . ALPRAZolam (XANAX) 0.25 MG tablet Take 0.25 mg by mouth at bedtime as needed.        Marland Kitchen aspirin 81 MG tablet Take 81 mg by mouth daily.        . insulin NPH-insulin regular (NOVOLIN 70/30) (70-30) 100 UNIT/ML injection Inject into the skin 2 (two) times daily. 70/30       . metoprolol (TOPROL-XL) 50 MG 24 hr tablet Take 1 tablet (50 mg total) by mouth 2 (two) times daily.  60 tablet  11  . simvastatin (ZOCOR) 40 MG tablet Take 1 tablet (40 mg total) by mouth every evening.  30 tablet  11    No Known Allergies  History   Social History  . Marital Status: Married    Spouse Name: N/A    Number of Children: N/A  .  Years of Education: N/A   Occupational History  . Not on file.   Social History Main Topics  . Smoking status: Never Smoker   . Smokeless tobacco: Never Used  . Alcohol Use: No  . Drug Use:   . Sexually Active:    Other Topics Concern  . Not on file   Social History Narrative  . No narrative on file    No family history on file.  Review of Systems:  As stated in the HPI and otherwise negative.   BP 180/80  Pulse 76  Ht 5\' 5"  (1.651 m)  Wt 168 lb (76.204 kg)  BMI 27.96 kg/m2   Physical Examination: General: Well developed, well nourished, NAD HEENT: OP clear, mucus membranes moist SKIN: warm, dry. No rashes. Neuro: No focal deficits Musculoskeletal: Muscle strength 5/5 all ext Psychiatric: Mood and affect normal Neck: No JVD, no carotid bruits, no thyromegaly, no lymphadenopathy. Lungs:Clear bilaterally, no wheezes, rhonci, crackles Cardiovascular: Regular rate and rhythm. Systolic murmur. No gallops or rubs. Abdomen:Soft. Bowel sounds present. Non-tender.  Extremities: No lower extremity edema. Pulses are 2 + in the bilateral DP/PT.  EKG: Normal sinus rhythm. Left axis deviation. Old anteroseptal infarct  with Q-waves throughout the precordium.   Echo 04/26/11:  Left ventricle: The cavity size was normal. Wall thickness was increased in a pattern of mild LVH. Systolic function was normal. The estimated ejection fraction was in the range of 55% to 60%. Features are consistent with a pseudonormal left ventricular filling pattern, with concomitant abnormal relaxation and increased filling pressure (grade 2 diastolic dysfunction). - Mitral valve: The MV leaflets are mildly thickened. There is mild prolapse of the MV. MR is directed posteriorly into LA. MR is moderate to severe, reaching toward back of LA> - Left atrium: The atrium was mildly dilated. - Pulmonary arteries: PA peak pressure: 33mm Hg (S).  Lexiscan Stress Myoview: 04/15/11: Rest Procedure:  Myocardial perfusion imaging was performed at rest 45 minutes following the intravenous administration of Technetium 4m Tetrofosmin.  Rest ECG: Old ASWMI.  Stress Procedure: The patient received IV Lexiscan 0.4 mg over 15-seconds. Technetium 25m Tetrofosmin injected at 30-seconds. There were no significant changes with Lexiscan. Quantitative spect images were obtained after a 45 minute delay.  Patient had nausea, vomiting and diarrhea after stress images. Aminophylline 75 mg was given IV with quick resolution of symptoms. BP 180/100 and 198/82, advised patient to take her Toprol.  Stress ECG: No significant ST segment change suggestive of ischemia.  QPS  Raw Data Images: Acquisition technically good; mild LVE.  Stress Images: There is decreased uptake in the anterolateral wall and apex.  Rest Images: There is decreased uptake in the anterolateral wall and apex.  Subtraction (SDS): There is a fixed defect that is most consistent with a previous infarction.  Transient Ischemic Dilatation (Normal <1.22): 1.05  Lung/Heart Ratio (Normal <0.45): 0.38  Quantitative Gated Spect Images  QGS EDV: 123 ml  QGS ESV: 51 ml  QGS cine images: Hypokinesis of the anterolateral wall.  QGS EF: 58%  Impression  Exercise Capacity: Lexiscan with no exercise.  BP Response: Normal blood pressure response.  Clinical Symptoms: No chest pain.  ECG Impression: No significant ST segment change suggestive of ischemia.  Comparison with Prior Nuclear Study: No images to compare  Overall Impression: Abnormal stress nuclear study with a small to moderate size fixed anterolateral and apical defect suggestive of previous infarct; no ischemia.  Carotid artery Dopplers: 04/22/11:  Mixed plaque bilaterally. 40-59% bilateral ICA stenosis.   Abdominal aortic u/s: 05/08/11:  Normal caliber distal aorta, bilateral iliac arteries.

## 2011-07-08 NOTE — Assessment & Plan Note (Signed)
Moderate MR by recent echo. Stable. Will follow with repeat echo in one year.

## 2011-07-08 NOTE — Assessment & Plan Note (Signed)
Recent stress test without ischemia. No chest pains. Stable. Continue ASA, beta blocker and statin. Lipids are at goal. BP is elevated today but this is managed by nephrology. No changes today.

## 2011-07-08 NOTE — Patient Instructions (Signed)
Your physician wants you to follow-up in:  12 months.  You will receive a reminder letter in the mail two months in advance. If you don't receive a letter, please call our office to schedule the follow-up appointment.   

## 2011-08-02 ENCOUNTER — Other Ambulatory Visit (HOSPITAL_COMMUNITY): Payer: Self-pay | Admitting: Gynecology

## 2011-08-02 DIAGNOSIS — Z139 Encounter for screening, unspecified: Secondary | ICD-10-CM

## 2011-08-14 ENCOUNTER — Other Ambulatory Visit: Payer: Self-pay | Admitting: Gynecology

## 2011-08-16 ENCOUNTER — Ambulatory Visit (HOSPITAL_COMMUNITY)
Admission: RE | Admit: 2011-08-16 | Discharge: 2011-08-16 | Disposition: A | Discharge status: Home / Self Care | Payer: Medicare Other | Source: Ambulatory Visit | Attending: Gynecology | Admitting: Gynecology

## 2011-08-16 DIAGNOSIS — Z1231 Encounter for screening mammogram for malignant neoplasm of breast: Secondary | ICD-10-CM | POA: Insufficient documentation

## 2011-08-16 DIAGNOSIS — Z139 Encounter for screening, unspecified: Secondary | ICD-10-CM

## 2011-09-18 ENCOUNTER — Other Ambulatory Visit: Payer: Self-pay | Admitting: *Deleted

## 2011-09-18 MED ORDER — SIMVASTATIN 40 MG PO TABS: 40.0000 mg | ORAL_TABLET | Freq: Every evening | ORAL | Status: DC

## 2012-01-02 LAB — FERRITIN
%SAT: 27
Ferritin: 999 ng/mL — AB (ref 9.0–150.0)
Hgb A1c MFr Bld: 6 % (ref 4.0–6.0)
UIBC: 184
WBC: 5.4

## 2012-01-15 ENCOUNTER — Telehealth: Payer: Self-pay | Admitting: Cardiovascular Disease

## 2012-01-15 NOTE — Telephone Encounter (Signed)
New problem:    [facility]  Is still waiting on paper work to be fax over.  Attention [place] phone # [phone]

## 2012-01-15 NOTE — Telephone Encounter (Signed)
Waiting for cardiac clearance for a kidney transplant.

## 2012-01-16 LAB — CBC
Hemoglobin: 8.4 g/dL — AB (ref 12.0–16.0)
Hep C Virus Ab: 0.8

## 2012-01-16 NOTE — Telephone Encounter (Signed)
Office note from visit with Dr. Clifton James on July 08, 2011 indicates pt may proceed with surgical procedure as planned.  I called to give pt this information. Left message to call back.

## 2012-01-21 NOTE — Telephone Encounter (Signed)
I called Gershon Cull to see what paperwork was needed. Left message to call back

## 2012-01-23 ENCOUNTER — Emergency Department (HOSPITAL_COMMUNITY)
Admission: EM | Admit: 2012-01-23 | Discharge: 2012-01-23 | Disposition: A | Discharge status: Home / Self Care | Payer: Medicare Other | Attending: Emergency Medicine | Admitting: Emergency Medicine

## 2012-01-23 ENCOUNTER — Encounter (HOSPITAL_COMMUNITY): Payer: Self-pay | Admitting: Emergency Medicine

## 2012-01-23 ENCOUNTER — Emergency Department (HOSPITAL_COMMUNITY): Payer: Medicare Other

## 2012-01-23 DIAGNOSIS — Z8543 Personal history of malignant neoplasm of ovary: Secondary | ICD-10-CM | POA: Insufficient documentation

## 2012-01-23 DIAGNOSIS — Z794 Long term (current) use of insulin: Secondary | ICD-10-CM | POA: Insufficient documentation

## 2012-01-23 DIAGNOSIS — G5600 Carpal tunnel syndrome, unspecified upper limb: Secondary | ICD-10-CM | POA: Insufficient documentation

## 2012-01-23 DIAGNOSIS — Z79899 Other long term (current) drug therapy: Secondary | ICD-10-CM | POA: Insufficient documentation

## 2012-01-23 DIAGNOSIS — E119 Type 2 diabetes mellitus without complications: Secondary | ICD-10-CM | POA: Insufficient documentation

## 2012-01-23 DIAGNOSIS — K922 Gastrointestinal hemorrhage, unspecified: Secondary | ICD-10-CM | POA: Insufficient documentation

## 2012-01-23 DIAGNOSIS — E785 Hyperlipidemia, unspecified: Secondary | ICD-10-CM | POA: Insufficient documentation

## 2012-01-23 DIAGNOSIS — D649 Anemia, unspecified: Secondary | ICD-10-CM | POA: Insufficient documentation

## 2012-01-23 DIAGNOSIS — Z90710 Acquired absence of both cervix and uterus: Secondary | ICD-10-CM | POA: Insufficient documentation

## 2012-01-23 DIAGNOSIS — I120 Hypertensive chronic kidney disease with stage 5 chronic kidney disease or end stage renal disease: Secondary | ICD-10-CM | POA: Insufficient documentation

## 2012-01-23 DIAGNOSIS — Z7982 Long term (current) use of aspirin: Secondary | ICD-10-CM | POA: Insufficient documentation

## 2012-01-23 DIAGNOSIS — R0602 Shortness of breath: Secondary | ICD-10-CM | POA: Insufficient documentation

## 2012-01-23 DIAGNOSIS — Z8673 Personal history of transient ischemic attack (TIA), and cerebral infarction without residual deficits: Secondary | ICD-10-CM | POA: Insufficient documentation

## 2012-01-23 DIAGNOSIS — Z992 Dependence on renal dialysis: Secondary | ICD-10-CM | POA: Insufficient documentation

## 2012-01-23 DIAGNOSIS — N186 End stage renal disease: Secondary | ICD-10-CM | POA: Insufficient documentation

## 2012-01-23 DIAGNOSIS — I2510 Atherosclerotic heart disease of native coronary artery without angina pectoris: Secondary | ICD-10-CM | POA: Insufficient documentation

## 2012-01-23 LAB — CBC WITH DIFFERENTIAL/PLATELET
Basophils Relative: 1 % (ref 0–1)
HCT: 24.2 % — ABNORMAL LOW (ref 36.0–46.0)
Hemoglobin: 7.7 g/dL — ABNORMAL LOW (ref 12.0–15.0)
Lymphocytes Relative: 10 % — ABNORMAL LOW (ref 12–46)
Lymphs Abs: 0.6 10*3/uL — ABNORMAL LOW (ref 0.7–4.0)
MCHC: 31.8 g/dL (ref 30.0–36.0)
Monocytes Absolute: 0.3 10*3/uL (ref 0.1–1.0)
Monocytes Relative: 5 % (ref 3–12)
Neutro Abs: 4.7 10*3/uL (ref 1.7–7.7)
RBC: 2.37 MIL/uL — ABNORMAL LOW (ref 3.87–5.11)

## 2012-01-23 LAB — BASIC METABOLIC PANEL
BUN: 35 mg/dL — ABNORMAL HIGH (ref 6–23)
CO2: 25 mEq/L (ref 19–32)
Chloride: 104 mEq/L (ref 96–112)
Creatinine, Ser: 4.83 mg/dL — ABNORMAL HIGH (ref 0.50–1.10)
Glucose, Bld: 103 mg/dL — ABNORMAL HIGH (ref 70–99)
Potassium: 3.9 mEq/L (ref 3.5–5.1)

## 2012-01-23 NOTE — ED Notes (Signed)
Patient c/o weakness and shortness of breath x 2-3 weeks.  States had rectal exam with specimen collected; states was notified yesterday that she has blood in stool.  Patient has appointment with Dr. Benard Rink this morning.  Patient is a dialysis patient.

## 2012-01-23 NOTE — ED Provider Notes (Signed)
History     CSN: [id number]  Arrival date & time 01/23/12  0425   First MD Initiated Contact with Patient 01/23/12 [phone]      Chief Complaint  Patient presents with  . Weakness  . Shortness of Breath  . Rectal Bleeding    (Consider location/radiation/quality/duration/timing/severity/associated sxs/prior treatment) HPI  [name] is a 66 y.o. female with a h/o DM, HTN, CAD, stroke, ESRD  On dialysis T-Th-Sa who presents to the Emergency Department complaining of progressive weakness and shortness of breath over the last 3 weeks. Stool guaiac cards recently positive with referral to see GI, Dr. Jena Gauss tomorrow.   Past Medical History  Diagnosis Date  . Diabetes mellitus   . Stroke   . Hypertension   . Hyperlipidemia   . Coronary artery disease   . Ovarian cancer   . Dialysis patient     Past Surgical History  Procedure Date  . Abdominal hysterectomy     1995  . Back surgery     2009    No family history on file.  History  Substance Use Topics  . Smoking status: Never Smoker   . Smokeless tobacco: Never Used  . Alcohol Use: No    OB History    Grav Para Term Preterm Abortions TAB SAB Ect Mult Living                  Review of Systems  Constitutional: Negative for fever.       10 Systems reviewed and are negative for acute change except as noted in the HPI.  HENT: Negative for congestion.   Eyes: Negative for discharge and redness.  Respiratory: Positive for shortness of breath. Negative for cough.   Cardiovascular: Negative for chest pain.  Gastrointestinal: Negative for vomiting and abdominal pain.  Musculoskeletal: Negative for back pain.  Skin: Negative for rash.  Neurological: Positive for weakness. Negative for syncope, numbness and headaches.  Psychiatric/Behavioral:       No behavior change.    Allergies  Review of patient's allergies indicates no known allergies.  Home Medications   Current Outpatient Rx  Name Route Sig Dispense  Refill  . ALPRAZOLAM 0.25 MG PO TABS Oral Take 0.25 mg by mouth at bedtime as needed.      . ASPIRIN 81 MG PO TABS Oral Take 81 mg by mouth daily.      . INSULIN ISOPHANE & REGULAR (70-30) 100 UNIT/ML [place] SUSP Subcutaneous Inject into the skin 2 (two) times daily. 70/30     . METOPROLOL SUCCINATE ER 50 MG PO TB24 Oral Take 1 tablet (50 mg total) by mouth 2 (two) times daily. 60 tablet 11  . SIMVASTATIN 40 MG PO TABS Oral Take 1 tablet (40 mg total) by mouth every evening. 30 tablet 11    BP 177/67  Pulse 69  Temp 98.2 F (36.8 C) (Oral)  Resp 18  Ht 5\' 5"  (1.651 m)  Wt 170 lb (77.111 kg)  BMI 28.29 kg/m2  SpO2 98%  Physical Exam  Nursing note and vitals reviewed. Constitutional: She appears well-developed and well-nourished.       Awake, alert, nontoxic appearance.  HENT:  Head: Atraumatic.  Eyes: Right eye exhibits no discharge. Left eye exhibits no discharge.  Neck: Neck supple.  Cardiovascular: Normal heart sounds.   Pulmonary/Chest: Effort normal and breath sounds normal. She exhibits no tenderness.       Fine crackles at both bases  Abdominal: Soft. Bowel sounds are normal.  There is no tenderness. There is no rebound.  Musculoskeletal: She exhibits no tenderness.       Baseline ROM, no obvious new focal weakness.  Neurological:       Mental status and motor strength appears baseline for patient and situation.  Skin: No rash noted.       AVG left arm with good bruit and thrill  Psychiatric: She has a normal mood and affect.    ED Course  Procedures (including critical care time) Results for orders placed during the hospital encounter of 01/23/12  CBC WITH DIFFERENTIAL      Component Value Range   WBC 5.7  4.0 - 10.5 K/uL   RBC 2.37 (*) 3.87 - 5.11 MIL/uL   Hemoglobin 7.7 (*) 12.0 - 15.0 g/dL   HCT 62.1 (*) 30.8 - 65.7 %   MCV 102.1 (*) 78.0 - 100.0 fL   MCH 32.5  26.0 - 34.0 pg   MCHC 31.8  30.0 - 36.0 g/dL   RDW 84.6  96.2 - 95.2 %   Platelets 212  150 - 400  K/uL   Neutrophils Relative 83 (*) 43 - 77 %   Neutro Abs 4.7  1.7 - 7.7 K/uL   Lymphocytes Relative 10 (*) 12 - 46 %   Lymphs Abs 0.6 (*) 0.7 - 4.0 K/uL   Monocytes Relative 5  3 - 12 %   Monocytes Absolute 0.3  0.1 - 1.0 K/uL   Eosinophils Relative 2  0 - 5 %   Eosinophils Absolute 0.1  0.0 - 0.7 K/uL   Basophils Relative 1  0 - 1 %   Basophils Absolute 0.0  0.0 - 0.1 K/uL  BASIC METABOLIC PANEL      Component Value Range   Sodium 139  135 - 145 mEq/L   Potassium 3.9  3.5 - 5.1 mEq/L   Chloride 104  96 - 112 mEq/L   CO2 25  19 - 32 mEq/L   Glucose, Bld 103 (*) 70 - 99 mg/dL   BUN 35 (*) 6 - 23 mg/dL   Creatinine, Ser 8.41 (*) 0.50 - 1.10 mg/dL   Calcium 9.2  8.4 - 32.4 mg/dL   GFR calc non Af Amer 8 (*) >90 mL/min   GFR calc Af Amer 10 (*) >90 mL/min    Dg Chest 2 View  01/23/2012  *RADIOLOGY REPORT*  Clinical Data: Shortness of breath.  Renal failure.  CHEST - 2 VIEW  Comparison: PA and lateral chest 03/01/2010.  Findings: There is cardiomegaly and mild interstitial edema.  No pneumothorax or pleural fluid.  No consolidative process.  IMPRESSION: Cardiomegaly and mild interstitial edema.   Original Report Authenticated By: Bernadene Bell. Maricela Curet, M.D.        [phone] Reviewed results with patient and her husband. She advised that last week her Hgb which is done monthly on dialysis was 8.7. Last Hgb in her records here 10.9, 07/2010.  MDM  Patient with progressive fatigue, weakness and shortness of breath x 3 weeks. Guaiac positive stools. Is to follow up with GI tomorrow. Pt stable in ED with no significant deterioration in condition.The patient appears reasonably screened and/or stabilized for discharge and I doubt any other medical condition or other [facility] requiring further screening, evaluation, or treatment in the ED at this time prior to discharge.  MDM Reviewed: nursing note and vitals Reviewed previous: labs Interpretation: labs             Nicoletta Dress. Colon Branch,  MD  01/23/12 0624 

## 2012-01-23 NOTE — ED Notes (Signed)
Pt alert & oriented x4, stable gait. Patient  given discharge instructions, paperwork & prescription(s). Patient verbalized understanding. Pt left department w/ no further questions. 

## 2012-01-24 ENCOUNTER — Observation Stay (HOSPITAL_COMMUNITY)
Admission: AD | Admit: 2012-01-24 | Discharge: 2012-01-25 | Disposition: A | Discharge status: Home / Self Care | Payer: Medicare Other | Source: Ambulatory Visit | Attending: Internal Medicine | Admitting: Internal Medicine

## 2012-01-24 ENCOUNTER — Encounter: Payer: Self-pay | Admitting: Urgent Care

## 2012-01-24 ENCOUNTER — Ambulatory Visit (INDEPENDENT_AMBULATORY_CARE_PROVIDER_SITE_OTHER): Payer: Medicare Other | Admitting: Urgent Care

## 2012-01-24 ENCOUNTER — Other Ambulatory Visit: Payer: Self-pay | Admitting: Internal Medicine

## 2012-01-24 ENCOUNTER — Encounter (HOSPITAL_COMMUNITY): Payer: Self-pay

## 2012-01-24 ENCOUNTER — Telehealth: Payer: Self-pay | Admitting: Urgent Care

## 2012-01-24 VITALS — BP 140/63 | HR 74 | Temp 98.1°F | Ht 65.0 in | Wt 171.0 lb

## 2012-01-24 DIAGNOSIS — R55 Syncope and collapse: Secondary | ICD-10-CM

## 2012-01-24 DIAGNOSIS — R195 Other fecal abnormalities: Secondary | ICD-10-CM

## 2012-01-24 DIAGNOSIS — E875 Hyperkalemia: Secondary | ICD-10-CM

## 2012-01-24 DIAGNOSIS — E782 Mixed hyperlipidemia: Secondary | ICD-10-CM | POA: Insufficient documentation

## 2012-01-24 DIAGNOSIS — D649 Anemia, unspecified: Secondary | ICD-10-CM | POA: Insufficient documentation

## 2012-01-24 DIAGNOSIS — I120 Hypertensive chronic kidney disease with stage 5 chronic kidney disease or end stage renal disease: Secondary | ICD-10-CM | POA: Insufficient documentation

## 2012-01-24 DIAGNOSIS — D539 Nutritional anemia, unspecified: Principal | ICD-10-CM | POA: Insufficient documentation

## 2012-01-24 DIAGNOSIS — I10 Essential (primary) hypertension: Secondary | ICD-10-CM

## 2012-01-24 DIAGNOSIS — I340 Nonrheumatic mitral (valve) insufficiency: Secondary | ICD-10-CM

## 2012-01-24 DIAGNOSIS — K635 Polyp of colon: Secondary | ICD-10-CM

## 2012-01-24 DIAGNOSIS — I5022 Chronic systolic (congestive) heart failure: Secondary | ICD-10-CM

## 2012-01-24 DIAGNOSIS — Z01810 Encounter for preprocedural cardiovascular examination: Secondary | ICD-10-CM

## 2012-01-24 DIAGNOSIS — I2510 Atherosclerotic heart disease of native coronary artery without angina pectoris: Secondary | ICD-10-CM

## 2012-01-24 DIAGNOSIS — E119 Type 2 diabetes mellitus without complications: Secondary | ICD-10-CM | POA: Insufficient documentation

## 2012-01-24 DIAGNOSIS — E785 Hyperlipidemia, unspecified: Secondary | ICD-10-CM

## 2012-01-24 DIAGNOSIS — Z794 Long term (current) use of insulin: Secondary | ICD-10-CM | POA: Insufficient documentation

## 2012-01-24 DIAGNOSIS — I498 Other specified cardiac arrhythmias: Secondary | ICD-10-CM

## 2012-01-24 DIAGNOSIS — N186 End stage renal disease: Secondary | ICD-10-CM | POA: Insufficient documentation

## 2012-01-24 HISTORY — DX: Polyp of colon: K63.5

## 2012-01-24 LAB — GLUCOSE, CAPILLARY: Glucose-Capillary: 109 mg/dL — ABNORMAL HIGH (ref 70–99)

## 2012-01-24 LAB — PREPARE RBC (CROSSMATCH)

## 2012-01-24 MED ORDER — SODIUM CHLORIDE 0.9 % IV SOLN: 100.0000 mL | INTRAVENOUS | Status: DC | PRN

## 2012-01-24 MED ORDER — NEPRO/CARBSTEADY PO LIQD: 237.0000 mL | ORAL | Status: DC | PRN

## 2012-01-24 MED ORDER — INSULIN ASPART PROT & ASPART (70-30 MIX) 100 UNIT/ML ~~LOC~~ SUSP
15.0000 [IU] | Freq: Every day | SUBCUTANEOUS | Status: DC
  Administered 2012-01-24: 15 [IU] via SUBCUTANEOUS
  Filled 2012-01-24: qty 10

## 2012-01-24 MED ORDER — INSULIN ASPART PROT & ASPART (70-30 MIX) 100 UNIT/ML ~~LOC~~ SUSP
20.0000 [IU] | Freq: Every day | SUBCUTANEOUS | Status: DC
  Administered 2012-01-25: 15 [IU] via SUBCUTANEOUS
  Filled 2012-01-24: qty 10

## 2012-01-24 MED ORDER — ALPRAZOLAM 0.25 MG PO TABS
0.2500 mg | ORAL_TABLET | Freq: Every evening | ORAL | Status: DC | PRN
  Administered 2012-01-24: 0.25 mg via ORAL
  Filled 2012-01-24: qty 1

## 2012-01-24 MED ORDER — DOCUSATE SODIUM 100 MG PO CAPS
100.0000 mg | ORAL_CAPSULE | Freq: Two times a day (BID) | ORAL | Status: DC
  Administered 2012-01-24 (×2): 100 mg via ORAL
  Filled 2012-01-24 (×2): qty 1

## 2012-01-24 MED ORDER — ONDANSETRON HCL 4 MG/2ML IJ SOLN: 4.0000 mg | Freq: Four times a day (QID) | INTRAMUSCULAR | Status: DC | PRN

## 2012-01-24 MED ORDER — ACETAMINOPHEN 325 MG PO TABS: 650.0000 mg | ORAL_TABLET | Freq: Four times a day (QID) | ORAL | Status: DC | PRN

## 2012-01-24 MED ORDER — LIDOCAINE-PRILOCAINE 2.5-2.5 % EX CREA
1.0000 "application " | TOPICAL_CREAM | CUTANEOUS | Status: DC | PRN
  Administered 2012-01-25: 1 via TOPICAL
  Filled 2012-01-24: qty 5

## 2012-01-24 MED ORDER — PEG 3350-KCL-NA BICARB-NACL 420 G PO SOLR: 4000.0000 mL | ORAL | Status: DC

## 2012-01-24 MED ORDER — PANTOPRAZOLE SODIUM 40 MG PO TBEC
40.0000 mg | DELAYED_RELEASE_TABLET | Freq: Every day | ORAL | Status: DC
  Administered 2012-01-24: 40 mg via ORAL
  Filled 2012-01-24: qty 1

## 2012-01-24 MED ORDER — SODIUM CHLORIDE 0.9 % IJ SOLN
INTRAMUSCULAR | Status: AC
  Filled 2012-01-24: qty 3

## 2012-01-24 MED ORDER — SENNOSIDES-DOCUSATE SODIUM 8.6-50 MG PO TABS: 1.0000 | ORAL_TABLET | Freq: Every evening | ORAL | Status: DC | PRN

## 2012-01-24 MED ORDER — ACETAMINOPHEN 650 MG RE SUPP: 650.0000 mg | Freq: Four times a day (QID) | RECTAL | Status: DC | PRN

## 2012-01-24 MED ORDER — SIMVASTATIN 20 MG PO TABS
40.0000 mg | ORAL_TABLET | Freq: Every evening | ORAL | Status: DC
  Administered 2012-01-24: 40 mg via ORAL
  Filled 2012-01-24: qty 2

## 2012-01-24 MED ORDER — ONDANSETRON HCL 4 MG PO TABS: 4.0000 mg | ORAL_TABLET | Freq: Four times a day (QID) | ORAL | Status: DC | PRN

## 2012-01-24 MED ORDER — INSULIN NPH ISOPHANE & REGULAR (70-30) 100 UNIT/ML ~~LOC~~ SUSP: 20.0000 [IU] | Freq: Every day | SUBCUTANEOUS | Status: DC

## 2012-01-24 MED ORDER — METOPROLOL SUCCINATE ER 50 MG PO TB24
50.0000 mg | ORAL_TABLET | Freq: Two times a day (BID) | ORAL | Status: DC
  Administered 2012-01-24: 50 mg via ORAL
  Filled 2012-01-24 (×2): qty 1

## 2012-01-24 NOTE — Telephone Encounter (Signed)
Please arrange outpatient colonoscopy & EGD w/ Dr Jena Gauss next week [id number] positive Pt admitted today for anemia should go home tomorrow possibly

## 2012-01-24 NOTE — Consult Note (Signed)
Reason for Consult: End-stage renal disease Referring Physician: Dr. [name] is an 66 y.o. female.  HPI: Her she is a patient was history of diabetes, hypertension and end-stage renal disease  presently and came with complaints of weakness and was found to have anemia. Presently patient also was heme positive stool. Initially patient was found to have anemia which is a progressively declining. Stool for guaiac was given and she was found to have a positive stool. Presently her hemoglobin has gone down from about the 8s to her present level of 7.7. Patient denies any nausea vomiting. Her main complaint seems to be weakness and feeling tired. Patient does not have any frank bleeding. She denies also any abdominal pain.  Past Medical History  Diagnosis Date  . Diabetes mellitus   . Stroke 2010  . Hypertension   . Hyperlipidemia   . Coronary artery disease   . Ovarian cancer 1995    [name]  . Dialysis patient     T/Th/[facility]ef[facility]  . CKD (chronic kidney disease)   . Carpal tunnel syndrome   . Anemia     Past Surgical History  Procedure Date  . Back surgery 2009  . Complete abdominal hysterectomy 1995  . Abdominal hysterectomy     Family History  Problem Relation Age of Onset  . Diabetes Mother     Social History:  reports that she has never smoked. She has never used smokeless tobacco. She reports that she does not drink alcohol or use illicit drugs.  Allergies: No Known Allergies  Medications: I have reviewed the patient's current medications.  Results for orders placed during the hospital encounter of 01/23/12 (from the past 48 hour(s))  CBC WITH DIFFERENTIAL     Status: Abnormal   Collection Time   01/23/12  5:36 AM      Component Value Range Comment   WBC 5.7  4.0 - 10.5 K/uL    RBC 2.37 (*) 3.87 - 5.11 MIL/uL    Hemoglobin 7.7 (*) 12.0 - 15.0 g/dL    HCT 16.1 (*) 09.6 - 46.0 %    MCV 102.1 (*) 78.0 - 100.0 fL    MCH 32.5  26.0 - 34.0 pg    MCHC 31.8  30.0 - 36.0 g/dL    RDW 04.5  40.9 - 81.1 %    Platelets 212  150 - 400 K/uL    Neutrophils Relative 83 (*) 43 - 77 %    Neutro Abs 4.7  1.7 - 7.7 K/uL    Lymphocytes Relative 10 (*) 12 - 46 %    Lymphs Abs 0.6 (*) 0.7 - 4.0 K/uL    Monocytes Relative 5  3 - 12 %    Monocytes Absolute 0.3  0.1 - 1.0 K/uL    Eosinophils Relative 2  0 - 5 %    Eosinophils Absolute 0.1  0.0 - 0.7 K/uL    Basophils Relative 1  0 - 1 %    Basophils Absolute 0.0  0.0 - 0.1 K/uL   BASIC METABOLIC PANEL     Status: Abnormal   Collection Time   01/23/12  5:36 AM      Component Value Range Comment   Sodium 139  135 - 145 mEq/L    Potassium 3.9  3.5 - 5.1 mEq/L    Chloride 104  96 - 112 mEq/L    CO2 25  19 - 32 mEq/L    Glucose, Bld 103 (*)  70 - 99 mg/dL    BUN 35 (*) 6 - 23 mg/dL    Creatinine, Ser 9.60 (*) 0.50 - 1.10 mg/dL    Calcium 9.2  8.4 - 45.4 mg/dL    GFR calc non Af Amer 8 (*) >90 mL/min    GFR calc Af Amer 10 (*) >90 mL/min     Dg Chest 2 View  01/23/2012  *RADIOLOGY REPORT*  Clinical Data: Shortness of breath.  Renal failure.  CHEST - 2 VIEW  Comparison: PA and lateral chest 03/01/2010.  Findings: There is cardiomegaly and mild interstitial edema.  No pneumothorax or pleural fluid.  No consolidative process.  IMPRESSION: Cardiomegaly and mild interstitial edema.   Original Report Authenticated By: Bernadene Bell. [name], M.D.     Review of Systems  Constitutional: Positive for malaise/fatigue. Negative for weight loss.  Respiratory: Negative for hemoptysis and shortness of breath.   Cardiovascular: Negative for chest pain, palpitations, leg swelling and PND.  Gastrointestinal: Positive for blood in stool. Negative for nausea, vomiting, abdominal pain and diarrhea.  Neurological: Negative for dizziness.   Blood pressure 152/66, pulse 72, temperature 98.1 F (36.7 C), temperature source Oral, resp. rate 20, SpO2 98.00%. Physical Exam  Constitutional:  She is oriented to person, place, and time. No distress.  Eyes: No scleral icterus.  Neck: Neck supple. No JVD present.  Cardiovascular: Normal rate, regular rhythm and normal heart sounds.  Exam reveals no gallop.   No murmur heard. Respiratory: She has no wheezes. She has no rales.  GI: She exhibits no distension. There is no tenderness. There is no guarding.  Musculoskeletal: She exhibits no edema.  Neurological: She is alert and oriented to person, place, and time.    Assessment/Plan: Problem #1 end-stage renal disease she status post hemodialysis yesterday her BUN and creatinine was in acceptable range and normal potassium. Patient does not have any uremic sinus symptoms. Problem #2 anemia at this moment seems to be secondary to GI loss. Patient is brought to the hospital for possible endoscopy. Problem #3 history of hypertension her blood pressure seems to be reasonably controlled Problem #4 diabetes Problem #5 history of coronary artery disease she is a symptomatic. Problem #6 history of hyperphosphatemia patient is on a binder Problem #7 history of mitral valve insufficiency Problem #8 history of bradycardia. Plan: We'll make arrangements for patient to get dialysis tomorrow We'll hold her heparin We'll check her CBC, basic metabolic panel and phosphorus in the morning. We'll maintain her outpatient medications  Prosperity Darrough S 01/24/2012, 4:13 PM

## 2012-01-24 NOTE — Assessment & Plan Note (Signed)
Hemoglobin has dropped from 8.4 last week -7.7 yesterday.  ? Acute on chronic anemia of chronic disease.  No evidence of iron deficiency. No gross GI bleeding.  I discussed with Dr. Jena Gauss and Dr. Lendell Caprice who agrees to admit patient for 24-hour observation for blood transfusion and possibly dialysis tomorrow. Plans are for outpatient colonoscopy and EGD next week if she is not transfusion dependent.

## 2012-01-24 NOTE — Telephone Encounter (Signed)
Patient is scheduled for Monday Nov 4th with [facility] and I have given Kandice Instructions to take to the patient and prep was sent to the pharmacy

## 2012-01-24 NOTE — H&P (Signed)
Hospital Admission Note Date: 01/24/2012  Patient name: [name] Medical record number: [id number] Date of birth: 1945/10/29 Age: 66 y.o. Gender: female PCP: [name], MD  Attending physician: Christiane Ha, MD  Chief Complaint: Weakness and anemia.  History of Present Illness:  [name] is an 66 y.o. female who is directly admitted from [facility]'s office with symptomatic anemia. Over the past 3 weeks, she has been progressively more weak with fatigue, dyspnea on exertion. She came to the emergency room yesterday and was found to have a hemoglobin of 7.7 and heme positive stool. She has noted no hematochezia or melena. She has no previous history of GI bleeding. Her last colonoscopy was 8 years ago which showed nothing concerning. She's never had upper endoscopy. She has had no abdominal pain, weight loss, change in bowel habits. No heartburn. She takes an aspirin a day, no NSAIDs. She felt slightly queasy yesterday morning but has had no vomiting. Her appetite has been good. Hemoglobin noted in May of this year was 10.7 or so. She reports taking iron and erythropoietin product at dialysis. She has end-stage renal disease and gets dialysis every Tuesday Thursday Saturday. She makes small amounts of urine. She has had no chest pain. She's had no syncope or falls. She has been so profoundly weak that admission for transfusion has been arranged. Patient has had a transfusion with previous back surgery several years ago.  Past Medical History  Diagnosis Date  . Diabetes mellitus   . Stroke 2010  . Hypertension   . Hyperlipidemia   . Coronary artery disease   . Ovarian cancer 1995    [name]  . Dialysis patient     T/Th/Sat [facility]efak[facility]  . CKD (chronic kidney disease)   . Carpal tunnel syndrome   . Anemia     Meds: Prescriptions prior to admission  Medication Sig Dispense Refill  . ALPRAZolam (XANAX) 0.25 MG tablet Take  0.25 mg by mouth at bedtime as needed.        Marland Kitchen aspirin 81 MG tablet Take 81 mg by mouth daily.        . Insulin Isophane & Regular (HUMULIN 70/30 [place]) Inject into the skin. 20 units in the morning   15 units at night      . metoprolol (TOPROL-XL) 50 MG 24 hr tablet Take 1 tablet (50 mg total) by mouth 2 (two) times daily.  60 tablet  11  . polyethylene glycol-electrolytes (TRILYTE) 420 G solution Take 4,000 mLs by mouth as directed.  4000 mL  0  . simvastatin (ZOCOR) 40 MG tablet Take 1 tablet (40 mg total) by mouth every evening.  30 tablet  11    Allergies: Review of patient's allergies indicates no known allergies. History   Social History  . Marital Status: Married    Spouse Name: N/A    Number of Children: 3  . Years of Education: N/A   Occupational History  . RETIRED    Social History Main Topics  . Smoking status: Never Smoker   . Smokeless tobacco: Never Used  . Alcohol Use: No  . Drug Use: No  . Sexually Active: Not on file   Other Topics Concern  . Not on file   Social History Narrative   3 grown healthy childrenLives w/ husbandHas 8 grandkids   Family History  Problem Relation Age of Onset  . Diabetes Mother    Past Surgical History  Procedure Date  .  Back surgery 2009  . Complete abdominal hysterectomy 1995    Review of Systems: Systems reviewed and as per HPI, otherwise negative.  Physical Exam: Blood pressure 152/66, pulse 72, temperature 98.1 F (36.7 C), temperature source Oral, resp. rate 20, SpO2 98.00%. BP 152/66  Pulse 72  Temp 98.1 F (36.7 C) (Oral)  Resp 20  SpO2 98%  General Appearance:    Alert, cooperative, no distress, appears stated age  Head:    Normocephalic, without obvious abnormality, atraumatic  Eyes:    PERRL, conjunctiva pale, EOM's intact, fundi    benign, both eyes  Ears:    Normal TM's and external ear canals, both ears  Nose:   Nares normal, septum midline, mucosa normal, no drainage    or sinus tenderness    Throat:   Lips, mucosa, and tongue normal; teeth and gums normal  Neck:   Supple, symmetrical, trachea midline, no adenopathy;    thyroid:  no enlargement/tenderness/nodules; no carotid   bruit or JVD  Back:     Symmetric, no curvature, ROM normal, no CVA tenderness  Lungs:     Clear to auscultation bilaterally, respirations unlabored  Chest Wall:    No tenderness or deformity   Heart:    Regular rate and rhythm, S1 and S2 normal, no murmur, rub   or gallop  Breast Exam:    deferred   Abdomen:     Soft, non-tender, bowel sounds active all four quadrants,    no masses, no organomegaly, obese  Genitalia:   deferred   Rectal:   deferred, heme positive stool yesterday   Extremities:   Extremities normal, atraumatic, no cyanosis or edema  Pulses:   2+ and symmetric all extremities  Skin:  pale, turgor normal, no rashes or lesions, no jaundice   Lymph nodes:   Cervical, supraclavicular, and axillary nodes normal  Neurologic:   CNII-XII intact, normal strength, sensation and reflexes    throughout    Psychiatric: normal affect  Lab results: Basic Metabolic Panel:  Basename 01/23/12 0536  NA 139  K 3.9  CL 104  CO2 25  GLUCOSE 103*  BUN 35*  CREATININE 4.83*  CALCIUM 9.2  MG --  PHOS --   Liver Function Tests: No results found for this basename: AST:2,ALT:2,ALKPHOS:2,BILITOT:2,PROT:2,ALBUMIN:2 in the last 72 hours No results found for this basename: LIPASE:2,AMYLASE:2 in the last 72 hours No results found for this basename: AMMONIA:2 in the last 72 hours CBC:  Basename 01/23/12 0536  WBC 5.7  NEUTROABS 4.7  HGB 7.7*  HCT 24.2*  MCV 102.1*  PLT 212   Imaging results:  Dg Chest 2 View  01/23/2012  *RADIOLOGY REPORT*  Clinical Data: Shortness of breath.  Renal failure.  CHEST - 2 VIEW  Comparison: PA and lateral chest 03/01/2010.  Findings: There is cardiomegaly and mild interstitial edema.  No pneumothorax or pleural fluid.  No consolidative process.  IMPRESSION:  Cardiomegaly and mild interstitial edema.   Original Report Authenticated By: Bernadene Bell. Maricela Curet, M.D.     Assessment & Plan: Principal Problem:  *Macrocytic anemia Active Problems:  DM  ESRD (end stage renal disease)  Heme positive stool  Will check an anemia panel. Repeat hemoglobin and transfuse 1 unit of packed red blood cells. Reassess prior to transfusing further. Resume outpatient medications and arrange for inpatient dialysis tomorrow (consult Dr. Kristian Covey). Hold aspirin. Empiric Protonix for now per GIs recommendations. She has endoscopy scheduled as an outpatient Monday. Monitor blood glucose.  Gorden Stthomas L 01/24/2012,  2:38 PM

## 2012-01-24 NOTE — Assessment & Plan Note (Signed)
[name] is a pleasant 66 y.o. female with profound weakness, anemia, and Hemoccult-positive stool. She is a dialysis patient on dialysis Tuesday, Thursday, and Saturdays. She has not noticed any gross GI bleeding, however she was found Hemoccult positive on stool cards at dialysis center. She denies any significant GI complaints. She will need colonoscopy and EGD in the near future with Dr. Jena Gauss.  We need to rule out bleeding peptic ulcer disease, small bowel source, colorectal carcinoma.  Possible acute on chronic anemia.  I have discussed risks & benefits which include, but are not limited to, bleeding, infection, perforation & drug reaction.  The patient agrees with this plan & written consent will be obtained. Procedure is likely to be done on an outpatient basis so long as patient's hemoglobin is not transfusion dependent.

## 2012-01-24 NOTE — Progress Notes (Signed)
Referring Provider: Dr Fausto Skillern Primary Care Physician:  [name] Blamer, MD Primary Gastroenterologist:  Dr. Jena Gauss  Chief Complaint  Patient presents with  . Anemia    Heme positive    HPI:  [name] is a 66 y.o. female here as a referral from Dr. Fausto Skillern for anemia & hemoccult positive stool. She has history chronic kidney disease on dialysis Tuesday, Thursday, and Saturdays. Hemoglobin 01/16/12 was 8.4 and she was found to be Hemoccult positive on stool cards.  She actually presented to ER yesterday and was found to have a hemoglobin of 7.7. She tells me she is profoundly weak, can hardly walk, and feels like she is going to fall.  Complains of malaise. She denies any chest pain or palpitations.  Some shortness of breath on exertion.  C/o extreme fatigue x 3 weeks.  He has been on dialysis x 5 yrs.  She denies any history of rectal bleeding or melena.   Denies any upper GI symptoms including heartburn, indigestion, nausea, vomiting, dysphagia, odynophagia or anorexia.  Constipation from binders, eats prunes & this seemed to work.  BM every day.  Denies diarrhea, rectal bleeding, melena or weight loss.  Last colonoscopy 8 yrs ago by Dr. Victorino Dike normal per pt.  Never had EGD before.  Hx anemia 1 unit PRBCS 3 1./2 yrs ago right after back surgery. Bayer 81mg  ASA daily.  No other NSAIDs.    01/02/12 ferritin 999, iron saturation 27, iron 68, TIBC 252, TIBC 184  Recent Results (from the past 336 hour(s))  CBC WITH DIFFERENTIAL   Collection Time   01/23/12  5:36 AM      Component Value Range   WBC 5.7  4.0 - 10.5 K/uL   RBC 2.37 (*) 3.87 - 5.11 MIL/uL   Hemoglobin 7.7 (*) 12.0 - 15.0 g/dL   HCT 16.1 (*) 09.6 - 04.5 %   MCV 102.1 (*) 78.0 - 100.0 fL   MCH 32.5  26.0 - 34.0 pg   MCHC 31.8  30.0 - 36.0 g/dL   RDW 40.9  81.1 - 91.4 %   Platelets 212  150 - 400 K/uL   Neutrophils Relative 83 (*) 43 - 77 %   Neutro Abs 4.7  1.7 - 7.7 K/uL   Lymphocytes Relative 10 (*) 12 - 46 %   Lymphs Abs 0.6 (*) 0.7 - 4.0 K/uL   Monocytes Relative 5  3 - 12 %   Monocytes Absolute 0.3  0.1 - 1.0 K/uL   Eosinophils Relative 2  0 - 5 %   Eosinophils Absolute 0.1  0.0 - 0.7 K/uL   Basophils Relative 1  0 - 1 %   Basophils Absolute 0.0  0.0 - 0.1 K/uL  BASIC METABOLIC PANEL   Collection Time   01/23/12  5:36 AM      Component Value Range   Sodium 139  135 - 145 mEq/L   Potassium 3.9  3.5 - 5.1 mEq/L   Chloride 104  96 - 112 mEq/L   CO2 25  19 - 32 mEq/L   Glucose, Bld 103 (*) 70 - 99 mg/dL   BUN 35 (*) 6 - 23 mg/dL   Creatinine, Ser 7.82 (*) 0.50 - 1.10 mg/dL   Calcium 9.2  8.4 - 95.6 mg/dL   GFR calc non Af Amer 8 (*) >90 mL/min   GFR calc Af Amer 10 (*) >90 mL/min   Past Medical History  Diagnosis Date  . Diabetes mellitus   .  Stroke 2010  . Hypertension   . Hyperlipidemia   . Coronary artery disease   . Ovarian cancer 1995    [name]  . Dialysis patient     T/Th/[facility]  . CKD (chronic kidney disease)   . Carpal tunnel syndrome   . Anemia     Past Surgical History  Procedure Date  . Back surgery 2009  . Complete abdominal hysterectomy 1995    Current Outpatient Prescriptions  Medication Sig Dispense Refill  . ALPRAZolam (XANAX) 0.25 MG tablet Take 0.25 mg by mouth at bedtime as needed.        Marland Kitchen aspirin 81 MG tablet Take 81 mg by mouth daily.        . Insulin Isophane & Regular (HUMULIN 70/30 [place]) Inject into the skin. 20 units in the morning   15 units at night      . metoprolol (TOPROL-XL) 50 MG 24 hr tablet Take 1 tablet (50 mg total) by mouth 2 (two) times daily.  60 tablet  11  . simvastatin (ZOCOR) 40 MG tablet Take 1 tablet (40 mg total) by mouth every evening.  30 tablet  11  . DISCONTD: insulin NPH-insulin regular (NOVOLIN 70/30) (70-30) 100 UNIT/ML injection Inject into the skin 2 (two) times daily. 70/30         Allergies as of 01/24/2012  . (No Known Allergies)    Family History:There is no known  family history of colorectal carcinoma , liver disease, or inflammatory bowel disease.  Problem Relation Age of Onset  . Diabetes Mother     History   Social History  . Marital Status: Married    Spouse Name: N/A    Number of Children: 3  . Years of Education: N/A   Occupational History  . RETIRED    Social History Main Topics  . Smoking status: Never Smoker   . Smokeless tobacco: Never Used  . Alcohol Use: No  . Drug Use: No  . Sexually Active: Not on file   Other Topics Concern  . Not on file   Social History Narrative   3 grown healthy childrenLives w/ husbandHas 8 grandkids    Review of Systems: Gen: Denies any fever, chills, sweats, weight loss, and sleep disorder CV: Denies chest pain, angina, palpitations, syncope, orthopnea, PND, peripheral edema, and claudication. Resp: See history of present illness.  denies cough or sputum. GI: Denies vomiting blood, jaundice, and fecal incontinence.    GU : On dialysis. small amounts of urine. Denies urinary burning, blood in urine, urinary frequency, urinary hesitancy, nocturnal urination, and urinary incontinence. MS: Denies joint pain, limitation of movement, and swelling, stiffness, low back pain, extremity pain. Denies muscle weakness, cramps, atrophy.  Derm: Denies rash, itching, dry skin, hives, moles, warts, or unhealing ulcers.  Psych: Denies depression, anxiety, memory loss, suicidal ideation, hallucinations, paranoia, and confusion. Heme: Some bruising. Recent flea bites to lower legs produced bruising from scratching. Denies bleeding, and enlarged lymph nodes. Neuro:  Denies any headaches, paresthesias. Endo:  Denies any problems with DM, thyroid, adrenal function.  Physical Exam: BP 140/63  Pulse 74  Temp 98.1 F (36.7 C) (Temporal)  Ht 5\' 5"  (1.651 m)  Wt 171 lb (77.565 kg)  BMI 28.46 kg/m2 No LMP recorded. Patient is postmenopausal. General:   Alert,  Well-developed, pale appearing, pleasant and  cooperative in NAD.  She is coming by her husband today. Head:  Normocephalic and atraumatic. Eyes:  Sclera clear, no icterus.  Conjunctiva pale. Ears:  Normal auditory acuity. Nose:  No deformity, discharge, or lesions. Mouth:  No deformity or lesions,oropharynx pink & moist. Neck:  Supple; no masses or thyromegaly. Lungs:  Clear throughout to auscultation.   No wheezes, crackles, or rhonchi. No acute distress. Heart:  Regular rate and rhythm; no murmurs, clicks, rubs,  or gallops. Abdomen:  Normal bowel sounds.  No bruits.  Soft, non-tender and non-distended without masses, hepatosplenomegaly or hernias noted.  No guarding or rebound tenderness.   Rectal:  Deferred. Msk:  Symmetrical without gross deformities. Normal posture. Pulses:  Normal pulses noted. Extremities:  No or edema. Neurologic:  Alert and oriented x4;  grossly normal neurologically. Skin:  Intact,. Ecchymosis lower legs Lymph Nodes:  No significant cervical adenopathy. Psych:  Alert and cooperative. Normal mood and affect.  Impression:  KALEIGHA CHAMBERLIN is a pleasant 66 y.o. female with profound weakness, anemia, and Hemoccult-positive stool. She is a dialysis patient on dialysis Tuesday, Thursday, and Saturdays. She has not noticed any gross GI bleeding, however she was found Hemoccult positive on stool cards at dialysis center. She denies any significant GI complaints. She will need colonoscopy and EGD in the near future with Dr. Jena Gauss.  We need to rule out bleeding peptic ulcer disease, small bowel source, & less likely colorectal carcinoma.  I have discussed risks & benefits which include, but are not limited to, bleeding, infection, perforation & drug reaction.  The patient agrees with this plan & written consent will be obtained.   Hemoglobin has dropped from 8.4 last week -7.7 yesterday.  ? Acute on chronic anemia of chronic disease.  No evidence of iron deficiency. No gross GI bleeding.  I discussed with Dr. Jena Gauss and  Dr. Lendell Caprice who agrees to accept patient for 24-hour observation for blood transfusion and possibly dialysis tomorrow. Plans are for outpatient colonoscopy and EGD next week if she is not transfusion dependent.   Plan:   1. patient to have direct admission to [facility] for 24-hour observation for blood transfusion 2. suggest PPI for gastric protection in the way of Protonix 40 mg daily 3. We will arrange for colonoscopy and EGD with Dr. Jena Gauss next week  Thank you Dr. Lendell Caprice for coordination of care for this patient.

## 2012-01-24 NOTE — Patient Instructions (Signed)
Go directly to [facility] for admission

## 2012-01-25 ENCOUNTER — Observation Stay (HOSPITAL_COMMUNITY): Payer: Medicare Other

## 2012-01-25 DIAGNOSIS — E119 Type 2 diabetes mellitus without complications: Secondary | ICD-10-CM

## 2012-01-25 DIAGNOSIS — D649 Anemia, unspecified: Secondary | ICD-10-CM

## 2012-01-25 LAB — CBC
HCT: 26.8 % — ABNORMAL LOW (ref 36.0–46.0)
Hemoglobin: 8.8 g/dL — ABNORMAL LOW (ref 12.0–15.0)
MCH: 31.9 pg (ref 26.0–34.0)
MCHC: 32.8 g/dL (ref 30.0–36.0)
MCV: 97.1 fL (ref 78.0–100.0)
RDW: 17.8 % — ABNORMAL HIGH (ref 11.5–15.5)

## 2012-01-25 LAB — IRON AND TIBC
Iron: 46 ug/dL (ref 42–135)
Saturation Ratios: 17 % — ABNORMAL LOW (ref 20–55)
TIBC: 275 ug/dL (ref 250–470)
UIBC: 229 ug/dL (ref 125–400)

## 2012-01-25 LAB — BASIC METABOLIC PANEL
BUN: 36 mg/dL — ABNORMAL HIGH (ref 6–23)
Calcium: 8.9 mg/dL (ref 8.4–10.5)
Creatinine, Ser: 4.67 mg/dL — ABNORMAL HIGH (ref 0.50–1.10)
GFR calc Af Amer: 10 mL/min — ABNORMAL LOW (ref >90)
GFR calc non Af Amer: 9 mL/min — ABNORMAL LOW (ref >90)
Glucose, Bld: 142 mg/dL — ABNORMAL HIGH (ref 70–99)

## 2012-01-25 LAB — GLUCOSE, CAPILLARY: Glucose-Capillary: 135 mg/dL — ABNORMAL HIGH (ref 70–99)

## 2012-01-25 LAB — TYPE AND SCREEN: Unit division: 0

## 2012-01-25 LAB — VITAMIN B12: Vitamin B-12: 357 pg/mL (ref 211–911)

## 2012-01-25 LAB — FOLATE: Folate: 9.4 ng/mL

## 2012-01-25 LAB — PHOSPHORUS: Phosphorus: 4.5 mg/dL (ref 2.3–4.6)

## 2012-01-25 MED ORDER — INSULIN ASPART PROT & ASPART (70-30 MIX) 100 UNIT/ML ~~LOC~~ SUSP
15.0000 [IU] | Freq: Every day | SUBCUTANEOUS | Status: DC
  Filled 2012-01-25: qty 3

## 2012-01-25 MED ORDER — EPOETIN ALFA 10000 UNIT/ML IJ SOLN
10000.0000 [IU] | INTRAMUSCULAR | Status: DC
  Administered 2012-01-25: 10000 [IU] via INTRAVENOUS
  Filled 2012-01-25 (×2): qty 1

## 2012-01-25 MED ORDER — EPOETIN ALFA 10000 UNIT/ML IJ SOLN
10000.0000 [IU] | INTRAMUSCULAR | Status: DC
  Filled 2012-01-25: qty 1

## 2012-01-25 NOTE — Discharge Summary (Signed)
Physician Discharge Summary  Patient ID: [name] MRN: [id number] DOB/AGE: 08-28-1945 66 y.o.  Admit date: 01/24/2012 Discharge date: 01/25/2012  Discharge Diagnoses:  Principal Problem:  *Macrocytic anemia Active Problems:  DM  ESRD (end stage renal disease)  HYPERLIPIDEMIA-MIXED  Heme positive stool     Medication List     As of 01/25/2012  8:28 AM    TAKE these medications         ALPRAZolam 0.25 MG tablet   Commonly known as: XANAX   Take 0.25 mg by mouth at bedtime as needed.      HUMULIN 70/30 [place]   Inject into the skin. 20 units in the morning    15 units at night      metoprolol succinate 50 MG 24 hr tablet   Commonly known as: TOPROL-XL   Take 1 tablet (50 mg total) by mouth 2 (two) times daily.      polyethylene glycol-electrolytes 420 G solution   Commonly known as: NuLYTELY/GoLYTELY   Take 4,000 mLs by mouth as directed.      simvastatin 40 MG tablet   Commonly known as: ZOCOR   Take 1 tablet (40 mg total) by mouth every evening.            Discharge Orders    Future Orders Please Complete By Expires   Diet Carb Modified      Activity as tolerated - No restrictions         Disposition: 01-Home or Self Care  Discharged Condition: stable  Consults: Treatment Team:  Jamse Mead, MD  Labs:   Results for orders placed during the hospital encounter of 01/24/12 (from the past 48 hour(s))  GLUCOSE, CAPILLARY     Status: Abnormal   Collection Time   01/24/12  4:18 PM      Component Value Range Comment   Glucose-Capillary 125 (*) 70 - 99 mg/dL    Comment 1 Notify RN      Comment 2 Documented in Chart     HEMOGLOBIN AND HEMATOCRIT, BLOOD     Status: Abnormal   Collection Time   01/24/12  4:34 PM      Component Value Range Comment   Hemoglobin 7.8 (*) 12.0 - 15.0 g/dL    HCT 01.0 (*) 27.2 - 46.0 %   VITAMIN B12     Status: Normal   Collection Time   01/24/12  4:34 PM      Component Value Range Comment   Vitamin B-12 357  211 -  911 pg/mL   FOLATE     Status: Normal   Collection Time   01/24/12  4:34 PM      Component Value Range Comment   Folate 9.4     IRON AND TIBC     Status: Abnormal   Collection Time   01/24/12  4:34 PM      Component Value Range Comment   Iron 46  42 - 135 ug/dL    TIBC 536  644 - 034 ug/dL    Saturation Ratios 17 (*) 20 - 55 %    UIBC 229  125 - 400 ug/dL   FERRITIN     Status: Abnormal   Collection Time   01/24/12  4:34 PM      Component Value Range Comment   Ferritin 1115 (*) 10 - 291 ng/mL   TYPE AND SCREEN     Status: Normal   Collection Time   01/24/12  4:35 PM  Component Value Range Comment   ABO/RH(D) B POS      Antibody Screen NEG      Sample Expiration 01/27/2012      Unit Number [id number]      Blood Component Type RED CELLS,LR      Unit division 00      Status of Unit ISSUED,FINAL      Transfusion Status OK TO TRANSFUSE      Crossmatch Result Compatible     PREPARE RBC (CROSSMATCH)     Status: Normal   Collection Time   01/24/12  4:44 PM      Component Value Range Comment   Order Confirmation ORDER PROCESSED BY BLOOD BANK     GLUCOSE, CAPILLARY     Status: Abnormal   Collection Time   01/24/12  8:44 PM      Component Value Range Comment   Glucose-Capillary 52 (*) 70 - 99 mg/dL    Comment 1 Notify RN      Comment 2 Documented in Chart      Comment 3 Repeat Test     GLUCOSE, CAPILLARY     Status: Abnormal   Collection Time   01/24/12  9:13 PM      Component Value Range Comment   Glucose-Capillary 109 (*) 70 - 99 mg/dL    Comment 1 Notify RN      Comment 2 Documented in Chart     HEMOGLOBIN AND HEMATOCRIT, BLOOD     Status: Abnormal   Collection Time   01/25/12  2:22 AM      Component Value Range Comment   Hemoglobin 8.8 (*) 12.0 - 15.0 g/dL    HCT 11.9 (*) 14.7 - 46.0 %   CBC     Status: Abnormal   Collection Time   01/25/12  5:28 AM      Component Value Range Comment   WBC 5.5  4.0 - 10.5 K/uL    RBC 2.76 (*) 3.87 - 5.11 MIL/uL    Hemoglobin 8.8  (*) 12.0 - 15.0 g/dL    HCT 82.9 (*) 56.2 - 46.0 %    MCV 97.1  78.0 - 100.0 fL    MCH 31.9  26.0 - 34.0 pg    MCHC 32.8  30.0 - 36.0 g/dL    RDW 13.0 (*) 86.5 - 15.5 %    Platelets 218  150 - 400 K/uL   BASIC METABOLIC PANEL     Status: Abnormal   Collection Time   01/25/12  5:28 AM      Component Value Range Comment   Sodium 138  135 - 145 mEq/L    Potassium 4.3  3.5 - 5.1 mEq/L    Chloride 103  96 - 112 mEq/L    CO2 24  19 - 32 mEq/L    Glucose, Bld 142 (*) 70 - 99 mg/dL    BUN 36 (*) 6 - 23 mg/dL    Creatinine, Ser 7.84 (*) 0.50 - 1.10 mg/dL    Calcium 8.9  8.4 - 69.6 mg/dL    GFR calc non Af Amer 9 (*) >90 mL/min    GFR calc Af Amer 10 (*) >90 mL/min   PHOSPHORUS     Status: Normal   Collection Time   01/25/12  5:28 AM      Component Value Range Comment   Phosphorus 4.5  2.3 - 4.6 mg/dL   GLUCOSE, CAPILLARY     Status: Abnormal   Collection Time   01/25/12  7:50 AM      Component Value Range Comment   Glucose-Capillary 135 (*) 70 - 99 mg/dL    Comment 1 Notify RN       Diagnostics:  Dg Chest 2 View  01/23/2012  *RADIOLOGY REPORT*  Clinical Data: Shortness of breath.  Renal failure.  CHEST - 2 VIEW  Comparison: PA and lateral chest 03/01/2010.  Findings: There is cardiomegaly and mild interstitial edema.  No pneumothorax or pleural fluid.  No consolidative process.  IMPRESSION: Cardiomegaly and mild interstitial edema.   Original Report Authenticated By: Bernadene Bell. Maricela Curet, M.D.     Procedures:  Hemodialysis  Full Code   Hospital Course: See H&P for complete admission details.  The patient is a pleasant 65 year old white female with end-stage renal disease on hemodialysis who was directly admitted from [facility] office with weakness and anemia. Her hemoglobin at baseline apparently is usually above 8. She reports she receives IV iron and Epogen with dialysis. She felt weak for 3 weeks and presented to the emergency room. At that time, her hemoglobin was found to be  7.7. She had heme positive stool. She was discharged and followed up with [facility]. There, she complained of profound weakness and malaise. Therefore she was admitted for blood transfusion. She had no other complaints. No chest pain. No falls or syncope. She was given one unit of packed red blood cells. Dr. Kristian Covey was consulted for dialysis orders. Her hemoglobin today is 8.8. She feels much better. She will ambulate about the hall. She will receive hemodialysis today. She can go home after dialysis.  Discharge Exam:  Blood pressure 185/78, pulse 72, temperature 98.1 F (36.7 C), temperature source Oral, resp. rate 18, height 5\' 5"  (1.651 m), weight 77.7 kg (171 lb 4.8 oz), SpO2 98.00%.  Unchanged from 01/24/2012  Signed: Crista Curb L 01/25/2012, 8:28 AM

## 2012-01-25 NOTE — Plan of Care (Signed)
Problem: Discharge Progression Outcomes Goal: Discharge plan in place and appropriate Outcome: Completed/Met Date Met:  01/25/12 Pt to return outpt for colonoscopy on Monday 01/27/12 Goal: Other Discharge Outcomes/Goals Outcome: Completed/Met Date Met:  01/25/12 Discharge instructions read to pt and her husband both verbalized understanding of all instructions.  Instructions for prep for colonoscopy where given to pt and her husband They read them and had no questions  Pt discharged to home

## 2012-01-25 NOTE — Progress Notes (Signed)
Subjective: Interval History: has no complaint of nausea or vomiting. Patient denies any her bleeding. Presently patient doesn't have any complaints. She denies also any abdominal pain. No history of her difficulty breathing orthopnea or paroxysmal nocturnal dyspnea..  Objective: Vital signs in last 24 hours: Temp:  [98.1 F (36.7 C)-98.6 F (37 C)] 98.1 F (36.7 C) (11/01 2143) Pulse Rate:  [70-74] 72  (11/01 2143) Resp:  [18-20] 18  (11/01 2143) BP: (140-185)/(63-79) 185/78 mmHg (11/01 2143) SpO2:  [96 %-98 %] 98 % (11/01 2143) Weight:  [77.565 kg (171 lb)-77.7 kg (171 lb 4.8 oz)] 77.7 kg (171 lb 4.8 oz) (11/01 2029) Weight change:   Intake/Output from previous day: 11/01 0701 - 11/02 0700 In: 490 [P.O.:240; I.V.:250] Out: -  Intake/Output this shift:    General appearance: alert, cooperative and no distress Resp: clear to auscultation bilaterally Cardio: regular rate and rhythm, S1, S2 normal, no murmur, click, rub or gallop GI: soft, non-tender; bowel sounds normal; no masses,  no organomegaly Extremities: extremities normal, atraumatic, no cyanosis or edema  Lab Results:  Basename 01/25/12 0528 01/25/12 0222 01/23/12 0536  WBC 5.5 -- 5.7  HGB 8.8* 8.8* --  HCT 26.8* 26.6* --  PLT 218 -- 212   BMET:  Basename 01/25/12 0528 01/23/12 0536  NA 138 139  K 4.3 3.9  CL 103 104  CO2 24 25  GLUCOSE 142* 103*  BUN 36* 35*  CREATININE 4.67* 4.83*  CALCIUM 8.9 9.2   No results found for this basename: PTH:2 in the last 72 hours Iron Studies:  Basename 01/24/12 1634  IRON 46  TIBC 275  TRANSFERRIN --  FERRITIN 1115*    Studies/Results: No results found.  I have reviewed the patient's current medications.  Assessment/Plan: Problem #1 end-stage renal disease she status post hemodialysis on Thursday her BUN is 36 creatinine is 4.67 with potassium of 4.3 patient is due for dialysis today.  Problem #2 anemia is a combination of GI bleeding and anemia of chronic  disease her hemoglobin is 8.8 hematocrit 26.8 seems to be better. Problem #3 history of hypertension blood pressure seems to be reasonably controlled Problem #4 history of diabetes Problem #5 history of hyperlipidemia Problem #6 history of cardiac disease Problem #7 history of bradycardia. Plan: We'll make arrangements for patient to get dialysis today We'll hold heparin during dialysis and the we'll give her Epogen 10,000 units IV after each dialysis. We'll continue his other medications and we'll follow patient.    LOS: 1 day   [name] S 01/25/2012,8:01 AM

## 2012-01-25 NOTE — Progress Notes (Signed)
Dialysis tx initiated without difficulty via RUA AVF (buttonhole) using 16 ga x 1" buttonhole needles.  Outpatient plan of care requested from [facility]

## 2012-01-27 ENCOUNTER — Telehealth: Payer: Self-pay

## 2012-01-27 ENCOUNTER — Ambulatory Visit (HOSPITAL_COMMUNITY)
Admission: RE | Admit: 2012-01-27 | Discharge: 2012-01-27 | Disposition: A | Discharge status: Home / Self Care | Payer: Medicare Other | Source: Ambulatory Visit | Attending: Internal Medicine | Admitting: Internal Medicine

## 2012-01-27 ENCOUNTER — Encounter (HOSPITAL_COMMUNITY): Payer: Self-pay | Admitting: *Deleted

## 2012-01-27 ENCOUNTER — Encounter (HOSPITAL_COMMUNITY)
Admission: RE | Disposition: A | Discharge status: Home / Self Care | Payer: Self-pay | Source: Ambulatory Visit | Attending: Internal Medicine

## 2012-01-27 DIAGNOSIS — Z01812 Encounter for preprocedural laboratory examination: Secondary | ICD-10-CM | POA: Insufficient documentation

## 2012-01-27 DIAGNOSIS — K5730 Diverticulosis of large intestine without perforation or abscess without bleeding: Secondary | ICD-10-CM

## 2012-01-27 DIAGNOSIS — K921 Melena: Secondary | ICD-10-CM | POA: Insufficient documentation

## 2012-01-27 DIAGNOSIS — E119 Type 2 diabetes mellitus without complications: Secondary | ICD-10-CM | POA: Insufficient documentation

## 2012-01-27 DIAGNOSIS — R195 Other fecal abnormalities: Secondary | ICD-10-CM

## 2012-01-27 DIAGNOSIS — D649 Anemia, unspecified: Secondary | ICD-10-CM

## 2012-01-27 DIAGNOSIS — R933 Abnormal findings on diagnostic imaging of other parts of digestive tract: Secondary | ICD-10-CM

## 2012-01-27 DIAGNOSIS — D126 Benign neoplasm of colon, unspecified: Secondary | ICD-10-CM | POA: Insufficient documentation

## 2012-01-27 DIAGNOSIS — K449 Diaphragmatic hernia without obstruction or gangrene: Secondary | ICD-10-CM | POA: Insufficient documentation

## 2012-01-27 DIAGNOSIS — I10 Essential (primary) hypertension: Secondary | ICD-10-CM | POA: Insufficient documentation

## 2012-01-27 DIAGNOSIS — Z794 Long term (current) use of insulin: Secondary | ICD-10-CM | POA: Insufficient documentation

## 2012-01-27 HISTORY — PX: COLONOSCOPY WITH ESOPHAGOGASTRODUODENOSCOPY (EGD): SHX5779

## 2012-01-27 LAB — GLUCOSE, CAPILLARY: Glucose-Capillary: 180 mg/dL — ABNORMAL HIGH (ref 70–99)

## 2012-01-27 SURGERY — COLONOSCOPY WITH ESOPHAGOGASTRODUODENOSCOPY (EGD)
Anesthesia: Moderate Sedation

## 2012-01-27 MED ORDER — MIDAZOLAM HCL 5 MG/5ML IJ SOLN
INTRAMUSCULAR | Status: AC
  Filled 2012-01-27: qty 10

## 2012-01-27 MED ORDER — SODIUM CHLORIDE 0.45 % IV SOLN: INTRAVENOUS | Status: DC

## 2012-01-27 MED ORDER — STERILE WATER FOR IRRIGATION IR SOLN
Status: DC | PRN
  Administered 2012-01-27: 14:00:00

## 2012-01-27 MED ORDER — MEPERIDINE HCL 100 MG/ML IJ SOLN
INTRAMUSCULAR | Status: DC | PRN
  Administered 2012-01-27 (×2): 25 mg via INTRAVENOUS
  Administered 2012-01-27: 50 mg via INTRAVENOUS

## 2012-01-27 MED ORDER — BUTAMBEN-TETRACAINE-BENZOCAINE 2-2-14 % EX AERO
INHALATION_SPRAY | CUTANEOUS | Status: DC | PRN
  Administered 2012-01-27: 2 via TOPICAL

## 2012-01-27 MED ORDER — MEPERIDINE HCL 100 MG/ML IJ SOLN
INTRAMUSCULAR | Status: AC
  Filled 2012-01-27: qty 1

## 2012-01-27 MED ORDER — MIDAZOLAM HCL 5 MG/5ML IJ SOLN
INTRAMUSCULAR | Status: DC | PRN
  Administered 2012-01-27: 2 mg via INTRAVENOUS
  Administered 2012-01-27 (×2): 1 mg via INTRAVENOUS
  Administered 2012-01-27: 2 mg via INTRAVENOUS

## 2012-01-27 MED ORDER — SODIUM CHLORIDE 0.9 % IV SOLN
INTRAVENOUS | Status: DC
  Administered 2012-01-27: 14:00:00 via INTRAVENOUS

## 2012-01-27 NOTE — Progress Notes (Signed)
Faxed to PCP and referring Dr

## 2012-01-27 NOTE — Telephone Encounter (Signed)
Per RMR- pt needs CT with pancreatic protocol with IV and oral contrast for abnormal second portion of the duodenum. Will need to be coordinated with Dr. Susa Griffins office because pt will need dialysis immediately after ct.   Benedetto Goad, please schedule.

## 2012-01-27 NOTE — H&P (View-Only) (Signed)
Referring Provider: Dr Befakadu Primary Care Physician:  [name], WILLIAM, MD Primary Gastroenterologist:  Dr. Rourk  Chief Complaint  Patient presents with  . Anemia    Heme positive    HPI:  [name] Mood is a 66 y.o. female here as a referral from Dr. Befakadu for anemia & hemoccult positive stool. She has history chronic kidney disease on dialysis Tuesday, Thursday, and Saturdays. Hemoglobin 01/16/12 was 8.4 and she was found to be Hemoccult positive on stool cards.  She actually presented to ER yesterday and was found to have a hemoglobin of 7.7. She tells me she is profoundly weak, can hardly walk, and feels like she is going to fall.  Complains of malaise. She denies any chest pain or palpitations.  Some shortness of breath on exertion.  C/o extreme fatigue x 3 weeks.  He has been on dialysis x 5 yrs.  She denies any history of rectal bleeding or melena.   Denies any upper GI symptoms including heartburn, indigestion, nausea, vomiting, dysphagia, odynophagia or anorexia.  Constipation from binders, eats prunes & this seemed to work.  BM every day.  Denies diarrhea, rectal bleeding, melena or weight loss.  Last colonoscopy 8 yrs ago by Dr. Sam Puryear normal per pt.  Never had EGD before.  Hx anemia 1 unit PRBCS 3 1./2 yrs ago right after back surgery. Bayer 81mg ASA daily.  No other NSAIDs.    01/02/12 ferritin 999, iron saturation 27, iron 68, TIBC 252, TIBC 184  Recent Results (from the past 336 hour(s))  CBC WITH DIFFERENTIAL   Collection Time   01/23/12  5:36 AM      Component Value Range   WBC 5.7  4.0 - 10.5 K/uL   RBC 2.37 (*) 3.87 - 5.11 MIL/uL   Hemoglobin 7.7 (*) 12.0 - 15.0 g/dL   HCT 24.2 (*) 36.0 - 46.0 %   MCV 102.1 (*) 78.0 - 100.0 fL   MCH 32.5  26.0 - 34.0 pg   MCHC 31.8  30.0 - 36.0 g/dL   RDW 14.4  11.5 - 15.5 %   Platelets 212  150 - 400 K/uL   Neutrophils Relative 83 (*) 43 - 77 %   Neutro Abs 4.7  1.7 - 7.7 K/uL   Lymphocytes Relative 10 (*) 12 - 46 %   Lymphs Abs 0.6 (*) 0.7 - 4.0 K/uL   Monocytes Relative 5  3 - 12 %   Monocytes Absolute 0.3  0.1 - 1.0 K/uL   Eosinophils Relative 2  0 - 5 %   Eosinophils Absolute 0.1  0.0 - 0.7 K/uL   Basophils Relative 1  0 - 1 %   Basophils Absolute 0.0  0.0 - 0.1 K/uL  BASIC METABOLIC PANEL   Collection Time   01/23/12  5:36 AM      Component Value Range   Sodium 139  135 - 145 mEq/L   Potassium 3.9  3.5 - 5.1 mEq/L   Chloride 104  96 - 112 mEq/L   CO2 25  19 - 32 mEq/L   Glucose, Bld 103 (*) 70 - 99 mg/dL   BUN 35 (*) 6 - 23 mg/dL   Creatinine, Ser 4.83 (*) 0.50 - 1.10 mg/dL   Calcium 9.2  8.4 - 10.5 mg/dL   GFR calc non Af Amer 8 (*) >90 mL/min   GFR calc Af Amer 10 (*) >90 mL/min   Past Medical History  Diagnosis Date  . Diabetes mellitus   .   Stroke 2010  . Hypertension   . Hyperlipidemia   . Coronary artery disease   . Ovarian cancer 1995    [name]  . Dialysis patient     T/Th/Sat [facility]-Dr Befakadu  . CKD (chronic kidney disease)   . Carpal tunnel syndrome   . Anemia     Past Surgical History  Procedure Date  . Back surgery 2009  . Complete abdominal hysterectomy 1995    Current Outpatient Prescriptions  Medication Sig Dispense Refill  . ALPRAZolam (XANAX) 0.25 MG tablet Take 0.25 mg by mouth at bedtime as needed.        . aspirin 81 MG tablet Take 81 mg by mouth daily.        . Insulin Isophane & Regular (HUMULIN 70/30 [place]) Inject into the skin. 20 units in the morning   15 units at night      . metoprolol (TOPROL-XL) 50 MG 24 hr tablet Take 1 tablet (50 mg total) by mouth 2 (two) times daily.  60 tablet  11  . simvastatin (ZOCOR) 40 MG tablet Take 1 tablet (40 mg total) by mouth every evening.  30 tablet  11  . DISCONTD: insulin NPH-insulin regular (NOVOLIN 70/30) (70-30) 100 UNIT/ML injection Inject into the skin 2 (two) times daily. 70/30         Allergies as of 01/24/2012  . (No Known Allergies)    Family History:There is no known  family history of colorectal carcinoma , liver disease, or inflammatory bowel disease.  Problem Relation Age of Onset  . Diabetes Mother     History   Social History  . Marital Status: Married    Spouse Name: N/A    Number of Children: 3  . Years of Education: N/A   Occupational History  . RETIRED    Social History Main Topics  . Smoking status: Never Smoker   . Smokeless tobacco: Never Used  . Alcohol Use: No  . Drug Use: No  . Sexually Active: Not on file   Other Topics Concern  . Not on file   Social History Narrative   3 grown healthy childrenLives w/ husbandHas 8 grandkids    Review of Systems: Gen: Denies any fever, chills, sweats, weight loss, and sleep disorder CV: Denies chest pain, angina, palpitations, syncope, orthopnea, PND, peripheral edema, and claudication. Resp: See history of present illness.  denies cough or sputum. GI: Denies vomiting blood, jaundice, and fecal incontinence.    GU : On dialysis. small amounts of urine. Denies urinary burning, blood in urine, urinary frequency, urinary hesitancy, nocturnal urination, and urinary incontinence. MS: Denies joint pain, limitation of movement, and swelling, stiffness, low back pain, extremity pain. Denies muscle weakness, cramps, atrophy.  Derm: Denies rash, itching, dry skin, hives, moles, warts, or unhealing ulcers.  Psych: Denies depression, anxiety, memory loss, suicidal ideation, hallucinations, paranoia, and confusion. Heme: Some bruising. Recent flea bites to lower legs produced bruising from scratching. Denies bleeding, and enlarged lymph nodes. Neuro:  Denies any headaches, paresthesias. Endo:  Denies any problems with DM, thyroid, adrenal function.  Physical Exam: BP 140/63  Pulse 74  Temp 98.1 F (36.7 C) (Temporal)  Ht 5' 5" (1.651 m)  Wt 171 lb (77.565 kg)  BMI 28.46 kg/m2 No LMP recorded. Patient is postmenopausal. General:   Alert,  Well-developed, pale appearing, pleasant and  cooperative in NAD.  She is coming by her husband today. Head:  Normocephalic and atraumatic. Eyes:  Sclera clear, no icterus.     Conjunctiva pale. Ears:  Normal auditory acuity. Nose:  No deformity, discharge, or lesions. Mouth:  No deformity or lesions,oropharynx pink & moist. Neck:  Supple; no masses or thyromegaly. Lungs:  Clear throughout to auscultation.   No wheezes, crackles, or rhonchi. No acute distress. Heart:  Regular rate and rhythm; no murmurs, clicks, rubs,  or gallops. Abdomen:  Normal bowel sounds.  No bruits.  Soft, non-tender and non-distended without masses, hepatosplenomegaly or hernias noted.  No guarding or rebound tenderness.   Rectal:  Deferred. Msk:  Symmetrical without gross deformities. Normal posture. Pulses:  Normal pulses noted. Extremities:  No or edema. Neurologic:  Alert and oriented x4;  grossly normal neurologically. Skin:  Intact,. Ecchymosis lower legs Lymph Nodes:  No significant cervical adenopathy. Psych:  Alert and cooperative. Normal mood and affect.  Impression:  Lakie A [name] is a pleasant 66 y.o. female with profound weakness, anemia, and Hemoccult-positive stool. She is a dialysis patient on dialysis Tuesday, Thursday, and Saturdays. She has not noticed any gross GI bleeding, however she was found Hemoccult positive on stool cards at dialysis center. She denies any significant GI complaints. She will need colonoscopy and EGD in the near future with Dr. Rourk.  We need to rule out bleeding peptic ulcer disease, small bowel source, & less likely colorectal carcinoma.  I have discussed risks & benefits which include, but are not limited to, bleeding, infection, perforation & drug reaction.  The patient agrees with this plan & written consent will be obtained.   Hemoglobin has dropped from 8.4 last week -7.7 yesterday.  ? Acute on chronic anemia of chronic disease.  No evidence of iron deficiency. No gross GI bleeding.  I discussed with Dr. Rourk and  Dr. Sullivan who agrees to accept patient for 24-hour observation for blood transfusion and possibly dialysis tomorrow. Plans are for outpatient colonoscopy and EGD next week if she is not transfusion dependent.   Plan:   1. patient to have direct admission to  hospital for 24-hour observation for blood transfusion 2. suggest PPI for gastric protection in the way of Protonix 40 mg daily 3. We will arrange for colonoscopy and EGD with Dr. Rourk next week  Thank you Dr. Sullivan for coordination of care for this patient. 

## 2012-01-27 NOTE — Op Note (Signed)
[facility] [address] [place], [address]   ENDOSCOPY PROCEDURE REPORT  PATIENT: [name], [name]  MR#: [id number] BIRTHDATE: 07/25/1945 , 66  yrs. old GENDER: Female ENDOSCOPIST: R.  Roetta Sessions, MD FACP FACG REFERRED BY:  Salomon Mast, M.D.  Holley Bouche, M.D. PROCEDURE DATE:  01/27/2012 PROCEDURE:     Diagnostic EGD  INDICATIONS:     Hemoccult positive stool with anemia  requiring transfusion  INFORMED CONSENT:   The risks, benefits, limitations, alternatives and imponderables have been discussed.  The potential for biopsy, esophogeal dilation, etc. have also been reviewed.  Questions have been answered.  All parties agreeable.  Please see the history and physical in the medical record for more information.  MEDICATIONS:Versed 5 mg IV and Demerol 75 mg IV in divided doses. Cetacaine spray  DESCRIPTION OF PROCEDURE:   The Pentax Gastroscope [id number] endoscope was introduced through the mouth and advanced to the second portion of the duodenum without difficulty or limitations. The mucosal surfaces were surveyed very carefully during advancement of the scope and upon withdrawal.  Retroflexion view of the proximal stomach and esophagogastric junction was performed.      FINDINGS: Normal esophagus. Stomach empty. Small hiatal hernia. Small hiatal hernia. Normal gastric mucosa. Patent pylorus. Apparent extrinsic mass effect into the lumen of the second portion of the duodenum arising from per-ampullary area producing some encroachment on the lumen at this level-see image 6. No ulcer or obvious infiltrating process noted. Otherwise, the duodenal mucosa through the second portion appeared normal.   THERAPEUTIC / DIAGNOSTIC MANEUVERS PERFORMED:   None   COMPLICATIONS:  None  IMPRESSION:  Small hiatal hernia. Abnormal second portion of the duodenum as described above of uncertain significance. See colonoscopy  report  RECOMMENDATIONS:    _______________________________ R. Roetta Sessions, MD FACP [facility] eSigned:  R. Roetta Sessions, MD FACP [facility] 01/27/2012 2:25 PM     CC:  PATIENT NAME:  [name], [name] MR#: [id number]

## 2012-01-27 NOTE — Interval H&P Note (Signed)
History and Physical Interval Note:  01/27/2012 2:00 PM  [name]  has presented today for surgery, with the diagnosis of ANEMIA  AND HEME POSITIVE STOOL  The various methods of treatment have been discussed with the patient and family. After consideration of risks, benefits and other options for treatment, the patient has consented to  Procedure(s) (LRB) with comments: COLONOSCOPY WITH ESOPHAGOGASTRODUODENOSCOPY (EGD) (N/A) - 1:45 as a surgical intervention .  The patient's history has been reviewed, patient examined, no change in status, stable for surgery.  I have reviewed the patient's chart and labs.  Questions were answered to the patient's satisfaction.     Eula Listen  Patient transfused with 2 units of PRBS's cells over the weekend. Followup hemoglobin 8.8; patient feeling much better today after transfusion. Proceed with EGD and colonoscopy per plan.  The risks, benefits, limitations, alternatives and imponderables have been reviewed with the patient. Potential for esophageal dilation, biopsy, etc. have also been reviewed.  Questions have been answered. All parties agreeable.

## 2012-01-27 NOTE — Telephone Encounter (Signed)
[facility] RN faxed instructions to review w/ pt Friday afternoon.

## 2012-01-27 NOTE — Op Note (Signed)
[facility] [address] [place], [address]   COLONOSCOPY PROCEDURE REPORT  PATIENT: [name], [name]  MR#:         [id number] BIRTHDATE: 06-16-1945 , 66  yrs. old GENDER: Female ENDOSCOPIST: R.  Roetta Sessions, MD FACP FACG REFERRED BY:  Salomon Mast, M.D.  Holley Bouche, M.D. PROCEDURE DATE:  01/27/2012 PROCEDURE:     Colonoscopy with polyp ablation and snare polypectomy  INDICATIONS: Hemoccult-positive stool; anemia requiring transfusion  INFORMED CONSENT:  The risks, benefits, alternatives and imponderables including but not limited to bleeding, perforation as well as the possibility of a missed lesion have been reviewed.  The potential for biopsy, lesion removal, etc. have also been discussed.  Questions have been answered.  All parties agreeable. Please see the history and physical in the medical record for more information.  MEDICATIONS: Versed 6 mg IV and Demerol 100 mg IV in divided doses.  DESCRIPTION OF PROCEDURE:  After a digital rectal exam was performed, the Pentax Colonoscope [phone]  colonoscope was advanced from the anus through the rectum and colon to the area of the cecum, ileocecal valve and appendiceal orifice.  The cecum was deeply intubated.  These structures were well-seen and photographed for the record.  From the level of the cecum and ileocecal valve, the scope was slowly and cautiously withdrawn.  The mucosal surfaces were carefully surveyed utilizing scope tip deflection to facilitate fold flattening as needed.  The scope was pulled down into the rectum where a thorough examination including retroflexion was performed.    FINDINGS:  Adequate preparation. Anal papilla; otherwise, normal appearing rectum. Single sigmoid diet for taking him. Somewhat stiff sigmoid and descending segments. Patient had a single 9 mm pedunculated polyp in the mid descending segment and a single adjacent diminutive polyp at this level; the  remainder of the colonic mucosa appeared normal. I attempted to intubate the terminal ileum multiple times but was not successful.  THERAPEUTIC / DIAGNOSTIC MANEUVERS PERFORMED:  the 2 above-mentioned descending colon polyps were hot snared and ablated with hot snare loop, respectively.  COMPLICATIONS: None  CECAL WITHDRAWAL TIME:  13 minutes  IMPRESSION/Recommendations:  Single left colon diverticulum. Colonic polyps treated/removed as described above. The polyps found  today would not explain this lady's degree of anemia although the larger one could have conceivably produced a Hemoccult positive stool. The abnormality seen in the duodenum on EGD earlier today needs further evaluation .  Given her prior medical history and the abnormality seen in her duodenum today, will pursue a pancreatic protocol CT abdomen/pelvis (IV and oral contrast) with dialysis to follow. We will need to coordinate with Dr. Fausto Skillern and dialysis. Ultimately, patient may still need a video capsule of her small intestine.  Further recommendations to follow.  See EGD report.   _______________________________ eSigned:  R. Roetta Sessions, MD FACP [facility] 01/27/2012 3:13 PM   CC:    PATIENT NAME:  [name], [name] MR#: [id number]

## 2012-01-28 ENCOUNTER — Other Ambulatory Visit: Payer: Self-pay | Admitting: Internal Medicine

## 2012-01-28 DIAGNOSIS — K319 Disease of stomach and duodenum, unspecified: Secondary | ICD-10-CM

## 2012-01-29 NOTE — Telephone Encounter (Signed)
Patient is scheduled for CT with pancreatic protocol with IV and oral contrast I have arranged her to go straight to dialysis immediately after her CT and patients husband Mr. Beckles is aware

## 2012-01-30 ENCOUNTER — Encounter: Payer: Self-pay | Admitting: *Deleted

## 2012-01-30 ENCOUNTER — Encounter (HOSPITAL_COMMUNITY): Payer: Self-pay | Admitting: Internal Medicine

## 2012-01-30 ENCOUNTER — Encounter: Payer: Self-pay | Admitting: Internal Medicine

## 2012-01-31 NOTE — Telephone Encounter (Signed)
Spoke with Gershon Cull and she has echo report but does not have office note from April 2013. I told her I would fax note from April office visit to her. Fax number is [phone]. Per Gershon Cull pt had echo done at [facility] on January 17, 2012 and might be  scheduled for TEE to be done at [facility].

## 2012-02-04 ENCOUNTER — Ambulatory Visit (HOSPITAL_COMMUNITY)
Admission: RE | Admit: 2012-02-04 | Discharge: 2012-02-04 | Disposition: A | Discharge status: Home / Self Care | Payer: Medicare Other | Source: Ambulatory Visit | Attending: Internal Medicine | Admitting: Internal Medicine

## 2012-02-04 ENCOUNTER — Other Ambulatory Visit (HOSPITAL_COMMUNITY): Payer: Medicare Other

## 2012-02-04 DIAGNOSIS — K319 Disease of stomach and duodenum, unspecified: Secondary | ICD-10-CM

## 2012-02-04 MED ORDER — IOHEXOL 300 MG/ML  SOLN
100.0000 mL | Freq: Once | INTRAMUSCULAR | Status: AC | PRN
  Administered 2012-02-04: 100 mL via INTRAVENOUS

## 2012-02-05 ENCOUNTER — Other Ambulatory Visit: Payer: Self-pay | Admitting: Internal Medicine

## 2012-02-05 DIAGNOSIS — K319 Disease of stomach and duodenum, unspecified: Secondary | ICD-10-CM

## 2012-02-06 ENCOUNTER — Telehealth: Payer: Self-pay

## 2012-02-06 NOTE — Telephone Encounter (Signed)
We can order STAT H/H. However, if she feels real weak and SOB, she should consider going to ED to expedite checking her H/H and arranging for blood transfusion if needed.   Let me know what they decide.

## 2012-02-06 NOTE — Progress Notes (Signed)
Referral has been faxed to Patty at Dr. Christella Hartigan and she will contact the patient with date & time

## 2012-02-06 NOTE — Telephone Encounter (Signed)
Pt had a H/H drawn this morning at the dialysis center. She is going to wait for a little bit and if not better she will go to the ER to get checked out.

## 2012-02-06 NOTE — Telephone Encounter (Signed)
Husband called today after I had spoken with pt about her ct results, to let us know that she has weakness and SOB, no chest pain, no dizziness. She feels like she did prior to blood transfusion ( pt was admitted 01/24/12, for blood transfusion) pt wants to know if they can check her hgb today to see what it is. Please advise.

## 2012-02-06 NOTE — Telephone Encounter (Signed)
Per patient, H/H drawn this AM at dialysis center but was told labs would not be back until Monday.  Patient would best be served by going to ED as we previously recommended.  Save for KJ.

## 2012-02-07 ENCOUNTER — Telehealth: Payer: Self-pay

## 2012-02-07 ENCOUNTER — Other Ambulatory Visit: Payer: Self-pay

## 2012-02-07 DIAGNOSIS — K319 Disease of stomach and duodenum, unspecified: Secondary | ICD-10-CM

## 2012-02-07 NOTE — Telephone Encounter (Signed)
Noted  

## 2012-02-07 NOTE — Telephone Encounter (Signed)
I called the dialysis center to see if they have her H/H and they did not. I then call the Pt to check how she was doing and she is at [facility] in the ER. She had a very bad night last night and could not get her breath.

## 2012-02-07 NOTE — Telephone Encounter (Signed)
Pt has been scheduled for a EUS and need to be reviewed and pt instructed Left message on machine to call back

## 2012-02-10 NOTE — Telephone Encounter (Signed)
Left message on machine to call back  

## 2012-02-11 NOTE — Telephone Encounter (Signed)
Left message on machine to call back  

## 2012-02-12 NOTE — Telephone Encounter (Signed)
Ok, thank you

## 2012-02-12 NOTE — Telephone Encounter (Signed)
Pt was called and she notified me that she was seen at [facility] and wants to cx the procedure with Dr Serina Cowper endo has been notified.  The pt is seeing the referring today and will make them aware

## 2012-02-14 ENCOUNTER — Telehealth: Payer: Self-pay | Admitting: Gastroenterology

## 2012-02-14 NOTE — Telephone Encounter (Signed)
Pt's husband called and was given information that made him believe that the EUS was being done at [facility].  She was released from [place] and the procedure was not done as he thought.  He now wants to reschedule the EUS.  I advised him to speak with Dr Jena Gauss and have them re refer her here so that the correct test and information is given to Dr Christella Hartigan for review.  The pt's husband agrees and will see Dr Jena Gauss today.

## 2012-02-25 ENCOUNTER — Other Ambulatory Visit: Payer: Self-pay

## 2012-02-25 ENCOUNTER — Telehealth: Payer: Self-pay

## 2012-02-25 DIAGNOSIS — K3189 Other diseases of stomach and duodenum: Secondary | ICD-10-CM

## 2012-02-25 NOTE — Telephone Encounter (Signed)
Left message on machine to call back  

## 2012-02-27 NOTE — Telephone Encounter (Signed)
I called the pt to give her the instructions as well as the date and time.  The pt request to move the appt to 04/09/12.  The appt was moved and Endo is aware.  Meds were reviewed

## 2012-03-12 ENCOUNTER — Encounter (HOSPITAL_COMMUNITY): Payer: Self-pay

## 2012-03-12 ENCOUNTER — Ambulatory Visit (HOSPITAL_COMMUNITY): Admit: 2012-03-12 | Payer: Self-pay | Admitting: Gastroenterology

## 2012-03-12 SURGERY — UPPER ENDOSCOPIC ULTRASOUND (EUS) LINEAR
Anesthesia: Monitor Anesthesia Care

## 2012-03-20 ENCOUNTER — Encounter (HOSPITAL_COMMUNITY): Payer: Self-pay | Admitting: Pharmacy Technician

## 2012-03-23 ENCOUNTER — Encounter (HOSPITAL_COMMUNITY): Payer: Self-pay | Admitting: *Deleted

## 2012-03-23 NOTE — Pre-Procedure Instructions (Signed)
Your procedure is scheduled [id number], April 09, 2012 Report to [facility] Admitting at:1030 Call this number if you have problems morning of your [phone]  Follow all bowel prep instructions per your doctor's orders.  Do not eat or drink anything after midnight the night before your procedure. You may brush your teeth, rinse out your mouth, but no water, no food, no chewing gum, no mints, no candies, no chewing tobacco.     Take these medicines the morning of your procedure with A SIP OF WATER:Coreg and use eye drop for left eye post catarct extraction   Please make arrangements for a responsible person to drive you home after the procedure. You cannot go home by cab/taxi. We recommend you have someone with you at home the first 24 hours after your procedure. Driver for procedure is spouse Engineer, site  LEAVE ALL VALUABLES, JEWELRY, BILLFOLD AT HOME.  NO DENTURES, CONTACT LENSES ALLOWED IN THE ENDOSCOPY ROOM.   YOU MAY WEAR DEODORANT, PLEASE REMOVE ALL JEWELRY, WATCHES RINGS, BODY PIERCINGS AND LEAVE AT HOME.   WOMEN: NO MAKE-UP, LOTIONS PERFUMES

## 2012-04-09 ENCOUNTER — Encounter (HOSPITAL_COMMUNITY): Payer: Self-pay | Admitting: Gastroenterology

## 2012-04-09 ENCOUNTER — Encounter (HOSPITAL_COMMUNITY): Payer: Self-pay

## 2012-04-09 ENCOUNTER — Ambulatory Visit (HOSPITAL_COMMUNITY)
Admission: RE | Admit: 2012-04-09 | Discharge: 2012-04-09 | Disposition: A | Discharge status: Home / Self Care | Payer: Medicare Other | Source: Ambulatory Visit | Attending: Gastroenterology | Admitting: Gastroenterology

## 2012-04-09 ENCOUNTER — Encounter (HOSPITAL_COMMUNITY): Payer: Self-pay | Admitting: Anesthesiology

## 2012-04-09 ENCOUNTER — Encounter (HOSPITAL_COMMUNITY)
Admission: RE | Disposition: A | Discharge status: Home / Self Care | Payer: Self-pay | Source: Ambulatory Visit | Attending: Gastroenterology

## 2012-04-09 ENCOUNTER — Ambulatory Visit (HOSPITAL_COMMUNITY): Payer: Medicare Other | Admitting: Anesthesiology

## 2012-04-09 DIAGNOSIS — E785 Hyperlipidemia, unspecified: Secondary | ICD-10-CM | POA: Insufficient documentation

## 2012-04-09 DIAGNOSIS — Z8673 Personal history of transient ischemic attack (TIA), and cerebral infarction without residual deficits: Secondary | ICD-10-CM | POA: Insufficient documentation

## 2012-04-09 DIAGNOSIS — I252 Old myocardial infarction: Secondary | ICD-10-CM | POA: Insufficient documentation

## 2012-04-09 DIAGNOSIS — R933 Abnormal findings on diagnostic imaging of other parts of digestive tract: Secondary | ICD-10-CM

## 2012-04-09 DIAGNOSIS — I2510 Atherosclerotic heart disease of native coronary artery without angina pectoris: Secondary | ICD-10-CM | POA: Insufficient documentation

## 2012-04-09 DIAGNOSIS — Z992 Dependence on renal dialysis: Secondary | ICD-10-CM | POA: Insufficient documentation

## 2012-04-09 DIAGNOSIS — I129 Hypertensive chronic kidney disease with stage 1 through stage 4 chronic kidney disease, or unspecified chronic kidney disease: Secondary | ICD-10-CM | POA: Insufficient documentation

## 2012-04-09 DIAGNOSIS — K319 Disease of stomach and duodenum, unspecified: Secondary | ICD-10-CM

## 2012-04-09 DIAGNOSIS — K2980 Duodenitis without bleeding: Secondary | ICD-10-CM | POA: Insufficient documentation

## 2012-04-09 DIAGNOSIS — N189 Chronic kidney disease, unspecified: Secondary | ICD-10-CM | POA: Insufficient documentation

## 2012-04-09 DIAGNOSIS — Z8543 Personal history of malignant neoplasm of ovary: Secondary | ICD-10-CM | POA: Insufficient documentation

## 2012-04-09 DIAGNOSIS — E119 Type 2 diabetes mellitus without complications: Secondary | ICD-10-CM | POA: Insufficient documentation

## 2012-04-09 HISTORY — DX: Acute myocardial infarction, unspecified: I21.9

## 2012-04-09 HISTORY — PX: EUS: SHX5427

## 2012-04-09 LAB — GLUCOSE, CAPILLARY: Glucose-Capillary: 153 mg/dL — ABNORMAL HIGH (ref 70–99)

## 2012-04-09 SURGERY — UPPER ENDOSCOPIC ULTRASOUND (EUS) LINEAR
Anesthesia: Monitor Anesthesia Care

## 2012-04-09 MED ORDER — PROMETHAZINE HCL 25 MG/ML IJ SOLN: 6.2500 mg | INTRAMUSCULAR | Status: DC | PRN

## 2012-04-09 MED ORDER — BUTAMBEN-TETRACAINE-BENZOCAINE 2-2-14 % EX AERO
INHALATION_SPRAY | CUTANEOUS | Status: DC | PRN
  Administered 2012-04-09: 2 via TOPICAL

## 2012-04-09 MED ORDER — MIDAZOLAM HCL 5 MG/5ML IJ SOLN
INTRAMUSCULAR | Status: DC | PRN
  Administered 2012-04-09: 2 mg via INTRAVENOUS

## 2012-04-09 MED ORDER — PROPOFOL 10 MG/ML IV EMUL
INTRAVENOUS | Status: DC | PRN
  Administered 2012-04-09: 100 ug/kg/min via INTRAVENOUS

## 2012-04-09 MED ORDER — SODIUM CHLORIDE 0.9 % IV SOLN
INTRAVENOUS | Status: DC | PRN
  Administered 2012-04-09: 12:00:00 via INTRAVENOUS

## 2012-04-09 MED ORDER — KETAMINE HCL 10 MG/ML IJ SOLN
INTRAMUSCULAR | Status: DC | PRN
  Administered 2012-04-09: 20 mg via INTRAVENOUS

## 2012-04-09 MED ORDER — SODIUM CHLORIDE 0.9 % IV SOLN: INTRAVENOUS | Status: DC

## 2012-04-09 NOTE — Op Note (Signed)
[facility] [address] [place], [address]   ENDOSCOPIC ULTRASOUND PROCEDURE REPORT  PATIENT: [name], [name]  MR#: [id number] BIRTHDATE: May 26, 1945  GENDER: Female ENDOSCOPIST: Rachael Fee, MD REFERRED BY:  Roetta Sessions, M.D. PROCEDURE DATE:  04/09/2012 PROCEDURE:   Upper EUS  , EGD with biopsy ASA CLASS:      Class III INDICATIONS:   recent anemia led to colonoscoy and EGD; EGD by Dr. Jena Gauss found abnormal duodenum.  Follow up CT scan shows no clear masses. MEDICATIONS: MAC sedation, administered by CRNA  DESCRIPTION OF PROCEDURE:   After the risks benefits and alternatives of the procedure were  explained, informed consent was obtained. The patient was then placed in the left, lateral, decubitus postion and IV sedation was administered. Throughout the procedure, the patients blood pressure, pulse and oxygen saturations were monitored continuously.  Under direct visualization, the Pentax EUS Radial [id number]  endoscope was introduced through the mouth  and advanced to the second portion of the duodenum .  Water was used as necessary to provide an acoustic interface.  Upon completion of the imaging, water was removed and the patient was sent to the recovery room in satisfactory condition.   Endoscopic findings (with radial echoendoscope and side viewing duodenoscope): 1. Normal esophagus, stomach. 2. Mucosa in periampulla was edematous, somewhat inflamed appearing. No frank ulcers or neoplasm. Mucosal biopsies taken with forceps.  EUS findings: 1. Duodenal wall, in region of ampulla was non-specifically, mildly thickened. This was from prominant mucosa and submucosal layers without any discrete masses. 2. Pancreatic parenchyma was normal without masses or findings of chronic pancreatitis 3. Main pancreatic duct was normal. 4. Normal gallbladder. 5. Limited views of liver, spleen, portal and splenic vessels were all normal.  Impression: Non  specific edema, thickening of periampullary duodenum. There was no discrete mass.  Mucosal biopsies taken and sent to pathology.  _______________________________ eSignedRachael Fee, MD 04/09/2012 12:16 PM

## 2012-04-09 NOTE — Anesthesia Postprocedure Evaluation (Signed)
  Anesthesia Post-op Note  Patient: [name]  Procedure(s) Performed: Procedure(s) (LRB): UPPER ENDOSCOPIC ULTRASOUND (EUS) LINEAR (N/A)  Patient Location: PACU  Anesthesia Type: MAC  Level of Consciousness: awake and alert   Airway and Oxygen Therapy: Patient Spontanous Breathing  Post-op Pain: mild  Post-op Assessment: Post-op Vital signs reviewed, Patient's Cardiovascular Status Stable, Respiratory Function Stable, Patent Airway and No signs of Nausea or vomiting  Last Vitals:  Filed Vitals:   04/09/12 1217  BP:   Pulse:   Temp: 36.7 C  Resp:     Post-op Vital Signs: stable   Complications: No apparent anesthesia complications

## 2012-04-09 NOTE — Preoperative (Signed)
Beta Blockers   Reason not to administer Beta Blockers:Not Applicable 

## 2012-04-09 NOTE — H&P (Signed)
  HPI: This is a woman found to have abnormal duodenum on EGD Dr. Jena Gauss. Ct scan shows no masses    Past Medical History  Diagnosis Date  . Diabetes mellitus   . Stroke 2010  . Hypertension   . Hyperlipidemia   . Coronary artery disease   . Dialysis patient     T/Th/[facility]  . Carpal tunnel syndrome   . Anemia   . Myocardial infarction 2009  . CKD (chronic kidney disease)   . Ovarian cancer 1995    [facility]    Past Surgical History  Procedure Date  . Back surgery 2009  . Complete abdominal hysterectomy 1995  . Abdominal hysterectomy   . Colonoscopy with esophagogastroduodenoscopy (egd) 01/27/2012    Procedure: COLONOSCOPY WITH ESOPHAGOGASTRODUODENOSCOPY (EGD);  Surgeon: Corbin Ade, MD;  [facility];  Service: Endoscopy;  Laterality: N/A;  1:45  . Cardiac catheterization     Current Facility-Administered Medications  Medication Dose Route Frequency Provider Last Rate Last Dose  . 0.9 %  sodium chloride infusion   Intravenous Continuous Rachael Fee, MD      . butamben-tetracaine-benzocaine (CETACAINE) spray    PRN Rachael Fee, MD   2 spray at 04/09/12 1141  . promethazine (PHENERGAN) injection 6.25-12.5 mg  6.25-12.5 mg Intravenous Q15 min PRN Eilene Ghazi, MD       Facility-Administered Medications Ordered in Other Encounters  Medication Dose Route Frequency Provider Last Rate Last Dose  . 0.9 %  sodium chloride infusion    Continuous PRN Thornell Mule, CRNA      . ketamine Higinio Roger) injection    PRN Thornell Mule, CRNA   20 mg at 04/09/12 1143  . midazolam (VERSED) 5 MG/5ML injection    PRN Thornell Mule, CRNA   2 mg at 04/09/12 1140  . propofol (DIPRIVAN) 10 mg/ml infusion    Continuous PRN Thornell Mule, CRNA 43.8 mL/hr at 04/09/12 1141 100 mcg/kg/min at 04/09/12 1141    Allergies as of 02/25/2012  . (No Known Allergies)    Family History  Problem Relation Age of Onset  .  Diabetes Mother     History   Social History  . Marital Status: Married    Spouse Name: N/A    Number of Children: 3  . Years of Education: N/A   Occupational History  . RETIRED    Social History Main Topics  . Smoking status: Former Smoker -- 0.5 packs/day for 15 years  . Smokeless tobacco: Never Used  . Alcohol Use: No  . Drug Use: No  . Sexually Active: Not on file   Other Topics Concern  . Not on file   Social History Narrative   3 grown healthy childrenLives w/ husbandHas 8 grandkids      Physical Exam: BP 161/72  Pulse 70  Temp 98.4 F (36.9 C) (Oral)  Resp 12  Ht 5\' 5"  (1.651 m)  Wt 161 lb (73.029 kg)  BMI 26.79 kg/m2  SpO2 96% Constitutional: generally well-appearing Psychiatric: alert and oriented x3 Abdomen: soft, nontender, nondistended, no obvious ascites, no peritoneal signs, normal bowel sounds     Assessment and plan: 67 y.o. female with abnromal duodenum  eus Today.

## 2012-04-09 NOTE — Transfer of Care (Signed)
Immediate Anesthesia Transfer of Care Note  Patient: [name]  Procedure(s) Performed: Procedure(s) (LRB) with comments: UPPER ENDOSCOPIC ULTRASOUND (EUS) LINEAR (N/A)  Patient Location: PACU  Anesthesia Type:MAC  Level of Consciousness: sedated, patient cooperative and responds to stimulation  Airway & Oxygen Therapy: Patient Spontanous Breathing and Patient connected to nasal cannula oxygen  Post-op Assessment: Report given to PACU RN and Post -op Vital signs reviewed and stable  Post vital signs: Reviewed and stable  Complications: No apparent anesthesia complications

## 2012-04-09 NOTE — Anesthesia Preprocedure Evaluation (Addendum)
Anesthesia Evaluation  Patient identified by MRN, date of birth, ID band Patient awake    Reviewed: Allergy & Precautions, H&P , NPO status , Patient's Chart, lab work & pertinent test results  Airway Mallampati: II TM Distance: >3 FB Neck ROM: Full    Dental No notable dental hx.    Pulmonary neg pulmonary ROS,  breath sounds clear to auscultation  Pulmonary exam normal       Cardiovascular hypertension, + CAD and + Past MI negative cardio ROS  Rhythm:Regular Rate:Normal     Neuro/Psych CVA negative psych ROS   GI/Hepatic negative GI ROS, Neg liver ROS,   Endo/Other  negative endocrine ROS  Renal/GU DialysisRenal disease  negative genitourinary   Musculoskeletal negative musculoskeletal ROS (+)   Abdominal   Peds negative pediatric ROS (+)  Hematology negative hematology ROS (+)   Anesthesia Other Findings   Reproductive/Obstetrics negative OB ROS                           Anesthesia Physical Anesthesia Plan  ASA: III  Anesthesia Plan: MAC   Post-op Pain Management:    Induction: Intravenous  Airway Management Planned: Nasal Cannula  Additional Equipment:   Intra-op Plan:   Post-operative Plan:   Informed Consent: I have reviewed the patients History and Physical, chart, labs and discussed the procedure including the risks, benefits and alternatives for the proposed anesthesia with the patient or authorized representative who has indicated his/her understanding and acceptance.     Plan Discussed with: CRNA and Surgeon  Anesthesia Plan Comments:         Anesthesia Quick Evaluation

## 2012-04-10 ENCOUNTER — Encounter (HOSPITAL_COMMUNITY): Payer: Self-pay | Admitting: Gastroenterology

## 2012-04-21 ENCOUNTER — Telehealth: Payer: Self-pay

## 2012-04-21 MED ORDER — PANTOPRAZOLE SODIUM 40 MG PO TBEC
40.0000 mg | DELAYED_RELEASE_TABLET | Freq: Every day | ORAL | Status: DC
Start: 2012-04-21 — End: 2012-09-14

## 2012-04-21 NOTE — Telephone Encounter (Signed)
pts husband came by office- pt is requesting ppi sent to [facility]. KJ suggested protonix at last ov.

## 2012-04-21 NOTE — Telephone Encounter (Signed)
I sent Protonix.

## 2012-06-23 ENCOUNTER — Telehealth: Payer: Self-pay

## 2012-06-23 NOTE — Telephone Encounter (Signed)
Message copied by Donata Duff on Tue Jun 23, 2012  8:05 AM ------      Message from: Donata Duff      Created: Mon Apr 20, 2012  1:57 PM       [name], she needs EGD with side viewing scope (at [facility]) in 3 months.                    Thanks                  ------

## 2012-06-24 NOTE — Telephone Encounter (Signed)
Dr Jena Gauss to schedule

## 2012-06-29 ENCOUNTER — Telehealth: Payer: Self-pay | Admitting: Cardiovascular Disease

## 2012-06-29 ENCOUNTER — Encounter: Payer: Self-pay | Admitting: Cardiovascular Disease

## 2012-07-10 ENCOUNTER — Encounter (INDEPENDENT_AMBULATORY_CARE_PROVIDER_SITE_OTHER): Payer: Medicare Other

## 2012-07-10 DIAGNOSIS — I700 Atherosclerosis of aorta: Secondary | ICD-10-CM

## 2012-07-10 DIAGNOSIS — N189 Chronic kidney disease, unspecified: Secondary | ICD-10-CM

## 2012-07-10 DIAGNOSIS — N185 Chronic kidney disease, stage 5: Secondary | ICD-10-CM

## 2012-07-14 ENCOUNTER — Telehealth: Payer: Self-pay | Admitting: Cardiology

## 2012-07-14 NOTE — Telephone Encounter (Signed)
AORTA Report Faxed to Dr. Cassell Smiles [phone]

## 2012-07-15 ENCOUNTER — Telehealth: Payer: Self-pay

## 2012-07-15 NOTE — Telephone Encounter (Signed)
Message copied by Donata Duff on Wed Jul 15, 2012  8:07 AM ------      Message from: LEWIS, Sabas Frett L      Created: Thu Apr 16, 2012 11:16 AM       Please call her.  The biopsies show no sign of tumor or cancer.  She should be on PPI once daily for now and try to avoid NSAIDs as best as possible.  I think a repeat evaluation with duodenoscope in 3-4 months is reasonable.  I will forward this to Dr. Jena Gauss as well. ------

## 2012-07-15 NOTE — Telephone Encounter (Signed)
Pt has been notified and recall in EPIC 

## 2012-08-03 ENCOUNTER — Ambulatory Visit: Payer: Medicare Other | Admitting: Cardiovascular Disease

## 2012-08-30 ENCOUNTER — Encounter (HOSPITAL_COMMUNITY): Payer: Self-pay | Admitting: *Deleted

## 2012-08-30 ENCOUNTER — Observation Stay (HOSPITAL_COMMUNITY)
Admission: EM | Admit: 2012-08-30 | Discharge: 2012-08-30 | DRG: 915 | Disposition: A | Discharge status: Home / Self Care | Payer: Medicare Other | Attending: Internal Medicine | Admitting: Internal Medicine

## 2012-08-30 DIAGNOSIS — I5032 Chronic diastolic (congestive) heart failure: Secondary | ICD-10-CM

## 2012-08-30 DIAGNOSIS — I509 Heart failure, unspecified: Secondary | ICD-10-CM | POA: Diagnosis present

## 2012-08-30 DIAGNOSIS — I10 Essential (primary) hypertension: Secondary | ICD-10-CM

## 2012-08-30 DIAGNOSIS — Z794 Long term (current) use of insulin: Secondary | ICD-10-CM

## 2012-08-30 DIAGNOSIS — T783XXA Angioneurotic edema, initial encounter: Principal | ICD-10-CM | POA: Diagnosis present

## 2012-08-30 DIAGNOSIS — Z87891 Personal history of nicotine dependence: Secondary | ICD-10-CM

## 2012-08-30 DIAGNOSIS — T464X5A Adverse effect of angiotensin-converting-enzyme inhibitors, initial encounter: Secondary | ICD-10-CM

## 2012-08-30 DIAGNOSIS — N186 End stage renal disease: Secondary | ICD-10-CM | POA: Diagnosis present

## 2012-08-30 DIAGNOSIS — I252 Old myocardial infarction: Secondary | ICD-10-CM

## 2012-08-30 DIAGNOSIS — E871 Hypo-osmolality and hyponatremia: Secondary | ICD-10-CM | POA: Diagnosis present

## 2012-08-30 DIAGNOSIS — I120 Hypertensive chronic kidney disease with stage 5 chronic kidney disease or end stage renal disease: Secondary | ICD-10-CM | POA: Diagnosis present

## 2012-08-30 DIAGNOSIS — T46905A Adverse effect of unspecified agents primarily affecting the cardiovascular system, initial encounter: Secondary | ICD-10-CM | POA: Diagnosis present

## 2012-08-30 DIAGNOSIS — I2510 Atherosclerotic heart disease of native coronary artery without angina pectoris: Secondary | ICD-10-CM | POA: Diagnosis present

## 2012-08-30 DIAGNOSIS — Z8673 Personal history of transient ischemic attack (TIA), and cerebral infarction without residual deficits: Secondary | ICD-10-CM

## 2012-08-30 DIAGNOSIS — Z8543 Personal history of malignant neoplasm of ovary: Secondary | ICD-10-CM

## 2012-08-30 DIAGNOSIS — E119 Type 2 diabetes mellitus without complications: Secondary | ICD-10-CM | POA: Diagnosis present

## 2012-08-30 DIAGNOSIS — E785 Hyperlipidemia, unspecified: Secondary | ICD-10-CM | POA: Diagnosis present

## 2012-08-30 DIAGNOSIS — N19 Unspecified kidney failure: Secondary | ICD-10-CM

## 2012-08-30 DIAGNOSIS — Z87898 Personal history of other specified conditions: Secondary | ICD-10-CM

## 2012-08-30 LAB — BASIC METABOLIC PANEL
CO2: 23 mEq/L (ref 19–32)
Chloride: 90 mEq/L — ABNORMAL LOW (ref 96–112)
Glucose, Bld: 113 mg/dL — ABNORMAL HIGH (ref 70–99)
Potassium: 4.1 mEq/L (ref 3.5–5.1)
Sodium: 129 mEq/L — ABNORMAL LOW (ref 135–145)

## 2012-08-30 LAB — CBC WITH DIFFERENTIAL/PLATELET
Eosinophils Absolute: 0.3 10*3/uL (ref 0.0–0.7)
Eosinophils Relative: 5 % (ref 0–5)
Hemoglobin: 12.3 g/dL (ref 12.0–15.0)
Lymphocytes Relative: 16 % (ref 12–46)
Lymphs Abs: 0.9 10*3/uL (ref 0.7–4.0)
MCH: 35.3 pg — ABNORMAL HIGH (ref 26.0–34.0)
MCV: 103.2 fL — ABNORMAL HIGH (ref 78.0–100.0)
Monocytes Relative: 13 % — ABNORMAL HIGH (ref 3–12)
Neutrophils Relative %: 65 % (ref 43–77)
Platelets: 205 10*3/uL (ref 150–400)
RBC: 3.48 MIL/uL — ABNORMAL LOW (ref 3.87–5.11)
WBC: 5.7 10*3/uL (ref 4.0–10.5)

## 2012-08-30 LAB — GLUCOSE, CAPILLARY: Glucose-Capillary: 221 mg/dL — ABNORMAL HIGH (ref 70–99)

## 2012-08-30 LAB — OSMOLALITY: Osmolality: 289 mOsm/kg (ref 275–300)

## 2012-08-30 LAB — MRSA PCR SCREENING: MRSA by PCR: NEGATIVE

## 2012-08-30 MED ORDER — DIPHENHYDRAMINE HCL 50 MG/ML IJ SOLN: 25.0000 mg | Freq: Three times a day (TID) | INTRAMUSCULAR | Status: DC

## 2012-08-30 MED ORDER — FAMOTIDINE IN NACL 20-0.9 MG/50ML-% IV SOLN
20.0000 mg | Freq: Once | INTRAVENOUS | Status: AC
  Administered 2012-08-30: 20 mg via INTRAVENOUS
  Filled 2012-08-30: qty 50

## 2012-08-30 MED ORDER — METHYLPREDNISOLONE SODIUM SUCC 125 MG IJ SOLR
125.0000 mg | Freq: Once | INTRAMUSCULAR | Status: AC
  Administered 2012-08-30: 125 mg via INTRAVENOUS
  Filled 2012-08-30: qty 2

## 2012-08-30 MED ORDER — ONDANSETRON HCL 4 MG/2ML IJ SOLN: 4.0000 mg | Freq: Four times a day (QID) | INTRAMUSCULAR | Status: DC | PRN

## 2012-08-30 MED ORDER — SODIUM CHLORIDE 0.9 % IJ SOLN: 3.0000 mL | Freq: Two times a day (BID) | INTRAMUSCULAR | Status: DC

## 2012-08-30 MED ORDER — SODIUM CHLORIDE 0.9 % IJ SOLN: 3.0000 mL | INTRAMUSCULAR | Status: DC | PRN

## 2012-08-30 MED ORDER — SODIUM CHLORIDE 0.9 % IV SOLN: 250.0000 mL | INTRAVENOUS | Status: DC | PRN

## 2012-08-30 MED ORDER — ONDANSETRON HCL 4 MG PO TABS: 4.0000 mg | ORAL_TABLET | Freq: Four times a day (QID) | ORAL | Status: DC | PRN

## 2012-08-30 MED ORDER — DIPHENHYDRAMINE HCL 50 MG/ML IJ SOLN
25.0000 mg | Freq: Once | INTRAMUSCULAR | Status: AC
Start: 2012-08-30 — End: 2012-08-30
  Administered 2012-08-30: 25 mg via INTRAVENOUS
  Filled 2012-08-30: qty 1

## 2012-08-30 MED ORDER — HEPARIN SODIUM (PORCINE) 5000 UNIT/ML IJ SOLN: 5000.0000 [IU] | Freq: Three times a day (TID) | INTRAMUSCULAR | Status: DC

## 2012-08-30 MED ORDER — EPINEPHRINE 0.3 MG/0.3ML IJ SOAJ: 0.3000 mg | Freq: Once | INTRAMUSCULAR | Status: DC

## 2012-08-30 MED ORDER — METHYLPREDNISOLONE SODIUM SUCC 40 MG IJ SOLR
40.0000 mg | Freq: Four times a day (QID) | INTRAMUSCULAR | Status: DC
  Administered 2012-08-30: 40 mg via INTRAVENOUS
  Filled 2012-08-30: qty 1

## 2012-08-30 MED ORDER — INSULIN ASPART 100 UNIT/ML ~~LOC~~ SOLN
0.0000 [IU] | Freq: Three times a day (TID) | SUBCUTANEOUS | Status: DC
  Administered 2012-08-30 (×2): 3 [IU] via SUBCUTANEOUS

## 2012-08-30 MED ORDER — EPINEPHRINE 0.3 MG/0.3ML IJ SOAJ
0.3000 mg | Freq: Once | INTRAMUSCULAR | Status: AC
  Administered 2012-08-30: 0.3 mg via INTRAMUSCULAR
  Filled 2012-08-30: qty 0.3

## 2012-08-30 MED ORDER — PREDNISONE 10 MG PO TABS: ORAL_TABLET | ORAL | Status: DC

## 2012-08-30 NOTE — ED Notes (Signed)
Pt reports that she woke up with her tongue swelling.  Pt denies difficulty swallowing or any SOB.

## 2012-08-30 NOTE — ED Notes (Signed)
Called [place] before he put order in 0500

## 2012-08-30 NOTE — Progress Notes (Signed)
Patient being discharged home. Patient alert, oriented and in stable condition at the time of discharge, no symptoms of angioedema present at the time of discharge. Patient given discharge instructions and verbalizes understanding. Patient being discharged home with husband.

## 2012-08-30 NOTE — ED Notes (Signed)
Swelling in patients tongue has decreased some.

## 2012-08-30 NOTE — ED Provider Notes (Signed)
History     CSN: [id number]  Arrival date & time 08/30/12  2952   First MD Initiated Contact with Patient 08/30/12 [phone]      Chief Complaint  Patient presents with  . Angioedema     Patient is a 67 y.o. female presenting with allergic reaction. The history is provided by the patient and the spouse.  Allergic Reaction Presenting symptoms: no difficulty breathing, no difficulty swallowing, no itching, no rash, no swelling and no wheezing   Presenting symptoms comment:  Tongue swelling  Severity:  Moderate Prior allergic episodes:  No prior episodes Relieved by:  Nothing Worsened by:  Nothing tried pt reports she woke up with tongue swelling, this started approximately 3 hours ago She feels it is worsening  No difficulty swallowing No difficulty breathing No cp/sob No rash No weakness No new exposures She has never had this before  She has h/o renal failure, on dialysis T/TH/S  Past Medical History  Diagnosis Date  . Diabetes mellitus   . Stroke 2010  . Hypertension   . Hyperlipidemia   . Coronary artery disease   . Dialysis patient     T/Th/[facility]  . Carpal tunnel syndrome   . Anemia   . Myocardial infarction 2009  . CKD (chronic kidney disease)   . Ovarian cancer 1995    [facility]    Past Surgical History  Procedure Laterality Date  . Back surgery  2009  . Complete abdominal hysterectomy  1995  . Abdominal hysterectomy    . Colonoscopy with esophagogastroduodenoscopy (egd)  01/27/2012    Procedure: COLONOSCOPY WITH ESOPHAGOGASTRODUODENOSCOPY (EGD);  Surgeon: Corbin Ade, MD;  Location[facility];  Service: Endoscopy;  Laterality: N/A;  1:45  . Cardiac catheterization    . Eus  04/09/2012    Procedure: UPPER ENDOSCOPIC ULTRASOUND (EUS) LINEAR;  Surgeon: Rachael Fee, MD;  Location: [facility] ENDOSCOPY;  Service: Endoscopy;  Laterality: N/A;    Family History  Problem Relation Age of Onset  . Diabetes Mother      History  Substance Use Topics  . Smoking status: Former Smoker -- 0.50 packs/day for 15 years  . Smokeless tobacco: Never Used  . Alcohol Use: No    OB History   Grav Para Term Preterm Abortions TAB SAB Ect Mult Living                  Review of Systems  Constitutional: Negative for fever.  HENT: Negative for trouble swallowing.   Respiratory: Negative for wheezing.   Cardiovascular: Negative for chest pain.  Gastrointestinal: Negative for diarrhea.  Skin: Negative for itching and rash.  Allergic/Immunologic: Negative for food allergies.  Neurological: Negative for syncope and weakness.  All other systems reviewed and are negative.    Allergies  Review of patient's allergies indicates no known allergies.  Home Medications   Current Outpatient Rx  Name  Route  Sig  Dispense  Refill  . acetaminophen (TYLENOL) 500 MG tablet   Oral   Take 500 mg by mouth every 6 (six) hours as needed. For pain         . ALPRAZolam (XANAX) 0.25 MG tablet   Oral   Take 0.25 mg by mouth at bedtime as needed. For sleep         . carvedilol (COREG) 12.5 MG tablet   Oral   Take 12.5 mg by mouth 2 (two) times daily with a meal.         .  Insulin Isophane & Regular (HUMULIN 70/30 [place])   Subcutaneous   Inject 15-20 Units into the skin 2 (two) times daily. 20 units in the morning   15 units at night         . lisinopril (PRINIVIL,ZESTRIL) 5 MG tablet   Oral   Take 5 mg by mouth every morning.         . pantoprazole (PROTONIX) 40 MG tablet   Oral   Take 1 tablet (40 mg total) by mouth daily. 30 minutes before breakfast.   30 tablet   3   . simvastatin (ZOCOR) 40 MG tablet   Oral   Take 1 tablet (40 mg total) by mouth every evening.   30 tablet   11     BP 136/66  Pulse 106  Temp(Src) 97.9 F (36.6 C) (Oral)  Resp 20  Ht 5\' 5"  (1.651 m)  Wt 169 lb (76.658 kg)  BMI 28.12 kg/m2  SpO2 100%  Physical Exam CONSTITUTIONAL: Well developed/well nourished HEAD:  Normocephalic/atraumatic EYES: EOMI/PERRL ENMT: Mucous membranes moist The anterior portion of her tongue is large and edematous.  There is no lip swelling.  I can visualize her ororpharynx and no edema noted in posterior oropharynx.  No stridor is noted.  No drooling is noted NECK: supple no meningeal signs SPINE:entire spine nontender CV: S1/S2 noted, no murmurs/rubs/gallops noted LUNGS: Lungs are clear to auscultation bilaterally, no apparent distress ABDOMEN: soft, nontender, no rebound or guarding GU:no cva tenderness NEURO: Pt is awake/alert, moves all extremitiesx4 EXTREMITIES: pulses normal, full ROM. Dialysis access noted to right UE - thrill noted SKIN: warm, color normal, no rash is noted PSYCH: no abnormalities of mood noted  ED Course  Procedures   CRITICAL CARE Performed by: Joya Gaskins Total critical care time: 31 Critical care time was exclusive of separately billable procedures and treating other patients. Critical care was necessary to treat or prevent imminent or life-threatening deterioration. Critical care was time spent personally by me on the following activities: development of treatment plan with patient and/or surrogate as well as nursing, discussions with consultants, evaluation of patient's response to treatment, examination of patient, obtaining history from patient, ordering and performing treatments and interventions, ordering and review of laboratory studies, ordering and review of radiographic studies, pulse oximetry and re-evaluation of patient's condition.   Labs Reviewed  BASIC METABOLIC PANEL  CBC WITH DIFFERENTIAL   4:14 AM Suspect she has ACE inhibitor induced angioedema She is protecting her airway but she has high risk of airway compromise meds ordered Will follow closely 4:21 AM Pt feels improved after meds were given Her tongue swelling has improved She was given epinephrine - denies cp/sob Will continue to follow closely 4:59  AM Pt continues to improve.  She is protecting her airway.  She does not require airway management at this time She will need admission to ensure she continues to improve Spoke to hospitalist will admit to stepdown Pt stabilized in the ED   Medications given in the ED Medications  famotidine (PEPCID) IVPB 20 mg (20 mg Intravenous New Bag/Given 08/30/12 0359)  methylPREDNISolone sodium succinate (SOLU-MEDROL) 125 mg/2 mL injection 125 mg (125 mg Intravenous Given 08/30/12 0355)  diphenhydrAMINE (BENADRYL) injection 25 mg (25 mg Intravenous Given 08/30/12 0354)  EPINEPHrine (EPI-PEN) injection 0.3 mg (0.3 mg Intramuscular Given 08/30/12 0355)     MDM  Nursing notes including past medical history and social history reviewed and considered in documentation Labs/vital reviewed and considered  Joya Gaskins, MD 08/30/12 [phone]

## 2012-08-30 NOTE — Discharge Summary (Addendum)
Physician Discharge Summary  [name] XBJ:[id number] DOB: [birth year] DOA: 08/30/2012  PCP: [name] Blamer, MD  Admit date: 08/30/2012 Discharge date: 08/30/2012  Time spent: 25 minutes  Recommendations for Outpatient Follow-up:  1. Issues cause for her angioedema is felt to be secondary to lisinopril. This is being discontinued. 2. Patient will follow up with her primary care physician in the next 2 weeks for followup. 3. Patient will continue with dialysis, her next session scheduled for this Tuesday  Discharge Diagnoses:  Active Problems:   DM   HYPERTENSION   ESRD (end stage renal disease)   Angioedema  diastolic heart failure  Discharge Condition: Improved, being discharged home  Diet recommendation: Low-sodium  Filed Weights   08/30/12 0346 08/30/12 0700  Weight: 76.658 kg (169 lb) 77.9 kg (171 lb 11.8 oz)    History of present illness:  67 year old female with a history of ESRD, diabetes mellitus, hypertension who came to the ED today with swelling of the tongue which started at 1 AM this morning. Patient denies difficulty swallowing denies shortness of breath no chest pain. Patient denies eating any unusual food over the past 24 hours. Patient takes lisinopril for hypertension which she started November of last year. Patient received Solu-Medrol, Pepcid, epinephrine, Benadryl in the ED and her swelling has started to improve.   Hospital Course:  Active Problems:   DM: Stable medical issue. While patient was n.p.o., on sliding scale only.    HYPERTENSION: ACE inhibitor discontinued. Patient will continue on Coreg. We'll defer to her primary care physician as to whether she needs additional blood pressure medication.    ESRD (end stage renal disease): Stable medical issue. Last received dialysis on day of admission. Next os is session scheduled for this Tuesday.    Angioedema: Principal problem. Felt to be secondary to ACE inhibitor, which is classic. Discontinue ACE  inhibitor. Patient received epinephrine, Benadryl, IV steroids and Pepcid in the emergency room. Her swelling continued to improve. She was monitored closely in the step down unit and continued on IV steroids and Benadryl. By hospital day 2, she was found much better and her tongue swelling had come down significantly. She is tolerating by mouth and felt comfortable being discharged home. She can be discharged on a steroid taper. Also given prescription for an EpiPen  Chronic diastolic heart failure: Stable issue during this hospitalization. No signs of any respiratory distress. Excess volume from this hospitalization can be removed at next dialysis.  Procedures:  None  Consultations:  None  Discharge Exam: Filed Vitals:   08/30/12 0750 08/30/12 0800 08/30/12 0841 08/30/12 0900  BP:  129/45  109/45  Pulse:   69 65  Temp: 98 F (36.7 C)     TempSrc: Oral     Resp:  18  14  Height:      Weight:      SpO2:   96%     General: Alert and oriented x3, no acute distress HEENT: Tongue still swollen, but less so according to patient. Cardiovascular: Regular rate and rhythm, S1-S2 Respiratory: Clear to auscultation bilaterally Abdomen: Soft, nontender, nondistended, positive bowel sounds : Extremities no clubbing or cyanosis or edema  Discharge Instructions  Discharge Orders   Future Orders Complete By Expires     Diet - low sodium heart healthy  As directed     Increase activity slowly  As directed         Medication List    STOP taking these medications  lisinopril 5 MG tablet  Commonly known as:  PRINIVIL,ZESTRIL      TAKE these medications       acetaminophen 500 MG tablet  Commonly known as:  TYLENOL  Take 500 mg by mouth every 6 (six) hours as needed. For pain     ALPRAZolam 0.25 MG tablet  Commonly known as:  XANAX  Take 0.25 mg by mouth at bedtime as needed. For sleep     carvedilol 12.5 MG tablet  Commonly known as:  COREG  Take 12.5 mg by mouth 2  (two) times daily with a meal.     EPINEPHrine 0.3 mg/0.3 mL Devi  Commonly known as:  EPIPEN  Inject 0.3 mLs (0.3 mg total) into the muscle once.     HUMULIN 70/30 [place]  Inject 15-20 Units into the skin 2 (two) times daily. 20 units in the morning    15 units at night     pantoprazole 40 MG tablet  Commonly known as:  PROTONIX  Take 1 tablet (40 mg total) by mouth daily. 30 minutes before breakfast.     predniSONE 10 MG tablet  Commonly known as:  DELTASONE  40mg  on day 1, then 30mg  on day 2, then 20mg  on day 3, then 10mg  on day 4, then stop.     simvastatin 40 MG tablet  Commonly known as:  ZOCOR  Take 1 tablet (40 mg total) by mouth every evening.       Allergies  Allergen Reactions  . Lisinopril Swelling       Follow-up Information   Follow up with [name] Blamer, MD. Schedule an appointment as soon as possible for a visit in 2 weeks.   Contact information:   [facility] [address] [place] [address] [phone]        The results of significant diagnostics from this hospitalization (including imaging, microbiology, ancillary and laboratory) are listed below for reference.    Significant Diagnostic Studies: No results found.   Labs: Basic Metabolic Panel:  Recent Labs Lab 08/30/12 0350  NA 129*  K 4.1  CL 90*  CO2 23  GLUCOSE 113*  BUN 42*  CREATININE 6.59*  CALCIUM 8.6   CBC:  Recent Labs Lab 08/30/12 0350  WBC 5.7  NEUTROABS 3.7  HGB 12.3  HCT 35.9*  MCV 103.2*  PLT 205   CBG:  Recent Labs Lab 08/30/12 0716  GLUCAP 209*       Signed:  Asjah Rauda K  [facility] 08/30/2012, 10:09 AM

## 2012-08-30 NOTE — H&P (Signed)
PCP:   [name], MD   Chief Complaint:  Swelling of the tongue  HPI: 67 year old female with a history of ESRD, diabetes mellitus, hypertension who came to the ED today with swelling of the tongue which started at 1 AM this morning. Patient denies difficulty swallowing denies shortness of breath no chest pain. Patient denies eating any unusual food over the past 24 hours. Patient takes lisinopril for hypertension which she started November of last year. Patient received Solu-Medrol, Pepcid, epinephrine, Benadryl in the ED and her swelling has started to improve.  Allergies:  No Known Allergies    Past Medical History  Diagnosis Date  . Diabetes mellitus   . Stroke 2010  . Hypertension   . Hyperlipidemia   . Coronary artery disease   . Dialysis patient     T/Th/[facility]  . Carpal tunnel syndrome   . Anemia   . Myocardial infarction 2009  . CKD (chronic kidney disease)   . Ovarian cancer 1995    [facility]    Past Surgical History  Procedure Laterality Date  . Back surgery  2009  . Complete abdominal hysterectomy  1995  . Abdominal hysterectomy    . Colonoscopy with esophagogastroduodenoscopy (egd)  01/27/2012    Procedure: COLONOSCOPY WITH ESOPHAGOGASTRODUODENOSCOPY (EGD);  Surgeon: Corbin Ade, MD;  [facility];  Service: Endoscopy;  Laterality: N/A;  1:45  . Cardiac catheterization    . Eus  04/09/2012    Procedure: UPPER ENDOSCOPIC ULTRASOUND (EUS) LINEAR;  Surgeon: Rachael Fee, MD;  Location: [facility];  Service: Endoscopy;  Laterality: N/A;    Prior to Admission medications   Medication Sig Start Date End Date Taking? Authorizing Provider  acetaminophen (TYLENOL) 500 MG tablet Take 500 mg by mouth every 6 (six) hours as needed. For pain    Historical Provider, MD  ALPRAZolam (XANAX) 0.25 MG tablet Take 0.25 mg by mouth at bedtime as needed. For sleep    Historical Provider, MD  carvedilol (COREG) 12.5 MG  tablet Take 12.5 mg by mouth 2 (two) times daily with a meal.    Historical Provider, MD  Insulin Isophane & Regular (HUMULIN 70/30 [place]) Inject 15-20 Units into the skin 2 (two) times daily. 20 units in the morning   15 units at night    Historical Provider, MD  lisinopril (PRINIVIL,ZESTRIL) 5 MG tablet Take 5 mg by mouth every morning.    Historical Provider, MD  pantoprazole (PROTONIX) 40 MG tablet Take 1 tablet (40 mg total) by mouth daily. 30 minutes before breakfast. 04/21/12   Nira Retort, NP  simvastatin (ZOCOR) 40 MG tablet Take 1 tablet (40 mg total) by mouth every evening. 09/18/11 09/17/12  Kathleene Hazel, MD    Social History:  reports that she has quit smoking. She has never used smokeless tobacco. She reports that she does not drink alcohol or use illicit drugs.  Family History  Problem Relation Age of Onset  . Diabetes Mother     Review of Systems:  HEENT: Denies headache, blurred vision, runny nose, sore throat,  Neck: Denies thyroid problems,lymphadenopathy Chest : Denies shortness of breath, no history of COPD Heart : Denies Chest pain, positive history of coronary arterey disease GI: Denies  nausea, vomiting, diarrhea, constipation GU: Denies dysuria, urgency, frequency of urination, hematuria Neuro: Denies stroke, seizures, syncope Psych: Denies depression, anxiety, hallucinations   Physical Exam: Blood pressure 136/66, pulse 106, temperature 97.9 F (36.6 C), temperature source Oral, resp.  rate 20, height 5\' 5"  (1.651 m), weight 76.658 kg (169 lb), SpO2 100.00%. Constitutional:   Patient is a well-developed and well-nourished female in no acute distress and cooperative with exam. Head: Normocephalic and atraumatic Mouth: Mucus membranes moist Eyes: PERRL, EOMI, conjunctivae normal Neck: Supple, No Thyromegaly Cardiovascular: RRR, S1 normal, S2 normal Pulmonary/Chest: CTAB, no wheezes, rales, or rhonchi Abdominal: Soft. Non-tender, non-distended, bowel  sounds are normal, no masses, organomegaly, or guarding present.  Neurological: A&O x3, Strenght is normal and symmetric bilaterally, cranial nerve II-XII are grossly intact, no focal motor deficit, sensory intact to light touch bilaterally.  Extremities : No Cyanosis, Clubbing or Edema   Labs on Admission:  Results for orders placed during the hospital encounter of 08/30/12 (from the past 48 hour(s))  BASIC METABOLIC PANEL     Status: Abnormal   Collection Time    08/30/12  3:50 AM      Result Value Range   Sodium 129 (*) 135 - 145 mEq/L   Potassium 4.1  3.5 - 5.1 mEq/L   Chloride 90 (*) 96 - 112 mEq/L   CO2 23  19 - 32 mEq/L   Glucose, Bld 113 (*) 70 - 99 mg/dL   BUN 42 (*) 6 - 23 mg/dL   Creatinine, Ser 9.14 (*) 0.50 - 1.10 mg/dL   Calcium 8.6  8.4 - 78.2 mg/dL   GFR calc non Af Amer 6 (*) >90 mL/min   GFR calc Af Amer 7 (*) >90 mL/min   Comment:            The eGFR has been calculated     using the CKD EPI equation.     This calculation has not been     validated in all clinical     situations.     eGFR's persistently     <90 mL/min signify     possible Chronic Kidney Disease.  CBC WITH DIFFERENTIAL     Status: Abnormal   Collection Time    08/30/12  3:50 AM      Result Value Range   WBC 5.7  4.0 - 10.5 K/uL   RBC 3.48 (*) 3.87 - 5.11 MIL/uL   Hemoglobin 12.3  12.0 - 15.0 g/dL   HCT 95.6 (*) 21.3 - 08.6 %   MCV 103.2 (*) 78.0 - 100.0 fL   MCH 35.3 (*) 26.0 - 34.0 pg   MCHC 34.3  30.0 - 36.0 g/dL   RDW 57.8  46.9 - 62.9 %   Platelets 205  150 - 400 K/uL   Neutrophils Relative % 65  43 - 77 %   Neutro Abs 3.7  1.7 - 7.7 K/uL   Lymphocytes Relative 16  12 - 46 %   Lymphs Abs 0.9  0.7 - 4.0 K/uL   Monocytes Relative 13 (*) 3 - 12 %   Monocytes Absolute 0.8  0.1 - 1.0 K/uL   Eosinophils Relative 5  0 - 5 %   Eosinophils Absolute 0.3  0.0 - 0.7 K/uL   Basophils Relative 1  0 - 1 %   Basophils Absolute 0.0  0.0 - 0.1 K/uL    Radiological Exams on Admission: No  results found.  Assessment/Plan Active Problems:   DM   HYPERTENSION   ESRD (end stage renal disease)   Angioedema  Angioedema Most likely due to lisinopril Will hold the lisinopril at this time Continue Solu-Medrol 40 mg every 6 hours, Benadryl 25 mg IV every 8 hours  Diabetes mellitus  We'll start sliding scale insulin  Hypertension Patient is n.p.o. at this time due to tongue swelling once the swelling improves she can just restarted on Coreg 12.5 twice a day  ESRD Patient's last dialysis was yesterday morning Her next dialysis is due on Tuesday  Hyponatremia Mild, we'll continue to monitor the sodium Will obtain plasma osmolality   Time Spent on Admission: 60 min  Kesha Hurrell S [facility] Pager: [phone] 08/30/2012, 5:12 AM

## 2012-09-02 NOTE — Progress Notes (Signed)
UR Chart Review Completed  

## 2012-09-09 ENCOUNTER — Telehealth: Payer: Self-pay | Admitting: Cardiovascular Disease

## 2012-09-09 NOTE — Telephone Encounter (Signed)
[name], Can we follow up on this? Thanks, chris

## 2012-09-09 NOTE — Telephone Encounter (Signed)
Pt calls b/c Dr. Ty Hilts at [facility] has told it is time for her to have another stress test. She is on the kidney transplant list.  Pt had a Lexiscan myovien 03/2011 in our office.   States that it made her very sick nausea & diarrhea. " I don't want that one again!"  She will have Dr. Ty Hilts send Dr. Clifton James a request for stress test  Mylo Red RN

## 2012-09-09 NOTE — Telephone Encounter (Signed)
New Prob     Pt states she was told she needs a stress test and needs some more information regarding this. Please call,.

## 2012-09-14 ENCOUNTER — Telehealth: Payer: Self-pay | Admitting: Internal Medicine

## 2012-09-14 MED ORDER — PANTOPRAZOLE SODIUM 40 MG PO TBEC: 40.0000 mg | DELAYED_RELEASE_TABLET | Freq: Every day | ORAL | Status: DC

## 2012-09-14 NOTE — Telephone Encounter (Signed)
Pt was last seen on 01/2012. Routing to refill box

## 2012-09-14 NOTE — Telephone Encounter (Signed)
done

## 2012-09-14 NOTE — Telephone Encounter (Signed)
Pt called this morning asking if [facility] would send in another Protonix rx for her to [facility] in [place]. [phone] or [phone]

## 2012-09-17 NOTE — Telephone Encounter (Signed)
No request has been received in office. I called pt to discuss. Left message to call back

## 2012-10-08 NOTE — Telephone Encounter (Signed)
Left message to call back  

## 2012-10-09 ENCOUNTER — Telehealth: Payer: Self-pay | Admitting: Cardiovascular Disease

## 2012-10-09 NOTE — Telephone Encounter (Signed)
Left message to call back  

## 2012-10-09 NOTE — Telephone Encounter (Signed)
See phone note dated June 18,2014 for documentation regarding this call.

## 2012-10-09 NOTE — Telephone Encounter (Signed)
Spoke with pt and told her we had not received request from Dr. Ty Hilts for stress test. She will contact them to refax request.

## 2012-10-09 NOTE — Telephone Encounter (Signed)
Follow up ° °Pt states she is returning your call.  °

## 2012-10-16 ENCOUNTER — Other Ambulatory Visit (HOSPITAL_COMMUNITY): Payer: Self-pay | Admitting: Obstetrics and Gynecology

## 2012-10-16 DIAGNOSIS — Z139 Encounter for screening, unspecified: Secondary | ICD-10-CM

## 2012-10-23 ENCOUNTER — Inpatient Hospital Stay (HOSPITAL_COMMUNITY): Admission: RE | Admit: 2012-10-23 | Payer: Medicare Other | Source: Ambulatory Visit

## 2012-10-26 ENCOUNTER — Ambulatory Visit: Payer: Self-pay | Admitting: Vascular Surgery

## 2012-10-26 LAB — BASIC METABOLIC PANEL
Anion Gap: 4 — ABNORMAL LOW (ref 7–16)
Calcium, Total: 8.5 mg/dL (ref 8.5–10.1)
Glucose: 103 mg/dL — ABNORMAL HIGH (ref 65–99)
Osmolality: 296 (ref 275–301)
Potassium: 5.3 mmol/L — ABNORMAL HIGH (ref 3.5–5.1)
Sodium: 141 mmol/L (ref 136–145)

## 2012-10-26 LAB — CBC
HCT: 24.6 % — ABNORMAL LOW (ref 35.0–47.0)
HGB: 8.7 g/dL — ABNORMAL LOW (ref 12.0–16.0)
MCHC: 35.3 g/dL (ref 32.0–36.0)
Platelet: 215 10*3/uL (ref 150–440)
RDW: 14 % (ref 11.5–14.5)
WBC: 6.9 10*3/uL (ref 3.6–11.0)

## 2012-10-30 ENCOUNTER — Ambulatory Visit: Payer: Self-pay | Admitting: Vascular Surgery

## 2012-10-30 HISTORY — PX: PERITONEAL CATHETER INSERTION: SHX2223

## 2012-10-30 LAB — POTASSIUM: Potassium: 5.3 mmol/L — ABNORMAL HIGH (ref 3.5–5.1)

## 2012-11-02 ENCOUNTER — Telehealth: Payer: Self-pay

## 2012-11-02 ENCOUNTER — Other Ambulatory Visit: Payer: Self-pay

## 2012-11-02 DIAGNOSIS — R933 Abnormal findings on diagnostic imaging of other parts of digestive tract: Secondary | ICD-10-CM

## 2012-11-02 NOTE — Telephone Encounter (Signed)
EGD [facility] 11/19/12 1130 am

## 2012-11-02 NOTE — Telephone Encounter (Signed)
EGD scheduled, pt instructed and medications reviewed.  Patient instructions mailed to home.  Patient to call with any questions or concerns.  

## 2012-11-03 ENCOUNTER — Encounter (HOSPITAL_COMMUNITY): Payer: Self-pay | Admitting: Pharmacy Technician

## 2012-11-04 ENCOUNTER — Telehealth: Payer: Self-pay | Admitting: Gastroenterology

## 2012-11-04 ENCOUNTER — Encounter (HOSPITAL_COMMUNITY): Payer: Self-pay | Admitting: *Deleted

## 2012-11-04 NOTE — Telephone Encounter (Signed)
Pt received a call from PAT at [facility] and was told to arrive at 845 am 2 1/2 hours prior to the procedure, I called and spoke with Sue Lush to confirm what time the pt needed to arrive.  Sue Lush spoke with the charge nurse and 1 1/2 hours prior is the correct time and she will talk with the charge nurse and have them speak with PAT about the arrival time.  Pt is aware 945 am is the correct arrival time.

## 2012-11-16 ENCOUNTER — Telehealth: Payer: Self-pay | Admitting: Gastroenterology

## 2012-11-16 NOTE — Telephone Encounter (Signed)
EGD rescheduled to 11/26/12 1130 am

## 2012-11-16 NOTE — Telephone Encounter (Signed)
Left message on machine to call back  

## 2012-11-17 NOTE — Telephone Encounter (Signed)
EGD scheduled, pt instructed and medications reviewed.  Patient instructions mailed to home.  Patient to call with any questions or concerns.  

## 2012-11-26 ENCOUNTER — Encounter (HOSPITAL_COMMUNITY): Payer: Self-pay | Admitting: Anesthesiology

## 2012-11-26 ENCOUNTER — Ambulatory Visit (HOSPITAL_COMMUNITY): Admission: RE | Admit: 2012-11-26 | Payer: Medicare Other | Source: Ambulatory Visit | Admitting: Gastroenterology

## 2012-11-26 ENCOUNTER — Encounter (HOSPITAL_COMMUNITY): Admission: RE | Payer: Self-pay | Source: Ambulatory Visit

## 2012-11-26 ENCOUNTER — Telehealth: Payer: Self-pay | Admitting: Gastroenterology

## 2012-11-26 ENCOUNTER — Other Ambulatory Visit: Payer: Self-pay

## 2012-11-26 DIAGNOSIS — D649 Anemia, unspecified: Secondary | ICD-10-CM

## 2012-11-26 SURGERY — ESOPHAGOGASTRODUODENOSCOPY (EGD) WITH PROPOFOL
Anesthesia: Monitor Anesthesia Care

## 2012-11-26 NOTE — Anesthesia Preprocedure Evaluation (Deleted)
Anesthesia Evaluation  Patient identified by MRN, date of birth, ID band Patient awake    Reviewed: Allergy & Precautions, H&P , NPO status , Patient's Chart, lab work & pertinent test results  Airway Mallampati: II TM Distance: >3 FB Neck ROM: Full    Dental no notable dental hx. (+) Dental Advisory Given   Pulmonary neg pulmonary ROS,  breath sounds clear to auscultation  Pulmonary exam normal       Cardiovascular hypertension, Pt. on medications + CAD and + Past MI negative cardio ROS  Rhythm:Regular Rate:Normal     Neuro/Psych CVA negative psych ROS   GI/Hepatic negative GI ROS, Neg liver ROS,   Endo/Other  diabetes, Type 2, Insulin Dependent and Oral Hypoglycemic Agents  Renal/GU DialysisRenal disease     Musculoskeletal negative musculoskeletal ROS (+)   Abdominal   Peds  Hematology negative hematology ROS (+)   Anesthesia Other Findings   Reproductive/Obstetrics negative OB ROS                           Anesthesia Physical  Anesthesia Plan  ASA: III  Anesthesia Plan: MAC   Post-op Pain Management:    Induction: Intravenous  Airway Management Planned: Nasal Cannula  Additional Equipment:   Intra-op Plan:   Post-operative Plan:   Informed Consent: I have reviewed the patients History and Physical, chart, labs and discussed the procedure including the risks, benefits and alternatives for the proposed anesthesia with the patient or authorized representative who has indicated his/her understanding and acceptance.   Dental advisory given  Plan Discussed with: CRNA  Anesthesia Plan Comments:         Anesthesia Quick Evaluation

## 2012-11-26 NOTE — Telephone Encounter (Signed)
Left message on machine to call back  

## 2012-11-26 NOTE — Telephone Encounter (Signed)
She did not show today.  Can you contact her, see if she wants to reschedule.

## 2012-11-26 NOTE — Telephone Encounter (Signed)
Pt has been re instructed and meds reviewed and instructions mailed to the home

## 2012-11-27 ENCOUNTER — Encounter (HOSPITAL_COMMUNITY): Payer: Self-pay | Admitting: *Deleted

## 2012-12-09 ENCOUNTER — Other Ambulatory Visit (HOSPITAL_COMMUNITY): Payer: Self-pay | Admitting: Nephrology

## 2012-12-09 DIAGNOSIS — Z139 Encounter for screening, unspecified: Secondary | ICD-10-CM

## 2012-12-10 ENCOUNTER — Encounter (HOSPITAL_COMMUNITY): Admission: RE | Payer: Self-pay | Source: Ambulatory Visit

## 2012-12-10 ENCOUNTER — Ambulatory Visit (HOSPITAL_COMMUNITY): Admission: RE | Admit: 2012-12-10 | Payer: Medicare Other | Source: Ambulatory Visit | Admitting: Gastroenterology

## 2012-12-10 SURGERY — ESOPHAGOGASTRODUODENOSCOPY (EGD) WITH PROPOFOL
Anesthesia: Monitor Anesthesia Care

## 2012-12-10 NOTE — Telephone Encounter (Signed)
Left message on pt's identified voicemail that we had not received paperwork and to call office if any questions. Pt also due for follow up appt with Dr. Clifton James

## 2012-12-15 ENCOUNTER — Inpatient Hospital Stay (HOSPITAL_COMMUNITY): Admission: RE | Admit: 2012-12-15 | Payer: Medicare Other | Source: Ambulatory Visit

## 2012-12-18 ENCOUNTER — Ambulatory Visit (HOSPITAL_COMMUNITY)
Admission: RE | Admit: 2012-12-18 | Discharge: 2012-12-18 | Disposition: A | Discharge status: Home / Self Care | Payer: Medicare Other | Source: Ambulatory Visit | Attending: Nephrology | Admitting: Nephrology

## 2012-12-18 DIAGNOSIS — Z139 Encounter for screening, unspecified: Secondary | ICD-10-CM

## 2012-12-18 DIAGNOSIS — Z1231 Encounter for screening mammogram for malignant neoplasm of breast: Secondary | ICD-10-CM | POA: Insufficient documentation

## 2013-01-10 ENCOUNTER — Other Ambulatory Visit: Payer: Self-pay

## 2013-01-10 ENCOUNTER — Inpatient Hospital Stay (HOSPITAL_COMMUNITY)
Admission: EM | Admit: 2013-01-10 | Discharge: 2013-01-12 | DRG: 689 | Disposition: A | Discharge status: Home / Self Care | Payer: Medicare Other | Attending: Internal Medicine | Admitting: Internal Medicine

## 2013-01-10 ENCOUNTER — Encounter (HOSPITAL_COMMUNITY): Payer: Self-pay | Admitting: Emergency Medicine

## 2013-01-10 ENCOUNTER — Inpatient Hospital Stay (HOSPITAL_COMMUNITY): Payer: Medicare Other

## 2013-01-10 DIAGNOSIS — I2510 Atherosclerotic heart disease of native coronary artery without angina pectoris: Secondary | ICD-10-CM

## 2013-01-10 DIAGNOSIS — I5032 Chronic diastolic (congestive) heart failure: Secondary | ICD-10-CM

## 2013-01-10 DIAGNOSIS — Z01810 Encounter for preprocedural cardiovascular examination: Secondary | ICD-10-CM

## 2013-01-10 DIAGNOSIS — Z8673 Personal history of transient ischemic attack (TIA), and cerebral infarction without residual deficits: Secondary | ICD-10-CM

## 2013-01-10 DIAGNOSIS — I120 Hypertensive chronic kidney disease with stage 5 chronic kidney disease or end stage renal disease: Secondary | ICD-10-CM | POA: Diagnosis present

## 2013-01-10 DIAGNOSIS — R933 Abnormal findings on diagnostic imaging of other parts of digestive tract: Secondary | ICD-10-CM

## 2013-01-10 DIAGNOSIS — A498 Other bacterial infections of unspecified site: Secondary | ICD-10-CM | POA: Diagnosis present

## 2013-01-10 DIAGNOSIS — Z794 Long term (current) use of insulin: Secondary | ICD-10-CM

## 2013-01-10 DIAGNOSIS — R531 Weakness: Secondary | ICD-10-CM

## 2013-01-10 DIAGNOSIS — D539 Nutritional anemia, unspecified: Secondary | ICD-10-CM

## 2013-01-10 DIAGNOSIS — E1169 Type 2 diabetes mellitus with other specified complication: Secondary | ICD-10-CM | POA: Diagnosis present

## 2013-01-10 DIAGNOSIS — I340 Nonrheumatic mitral (valve) insufficiency: Secondary | ICD-10-CM

## 2013-01-10 DIAGNOSIS — Z833 Family history of diabetes mellitus: Secondary | ICD-10-CM

## 2013-01-10 DIAGNOSIS — R55 Syncope and collapse: Secondary | ICD-10-CM

## 2013-01-10 DIAGNOSIS — Z992 Dependence on renal dialysis: Secondary | ICD-10-CM

## 2013-01-10 DIAGNOSIS — N390 Urinary tract infection, site not specified: Principal | ICD-10-CM | POA: Diagnosis present

## 2013-01-10 DIAGNOSIS — E119 Type 2 diabetes mellitus without complications: Secondary | ICD-10-CM | POA: Diagnosis present

## 2013-01-10 DIAGNOSIS — D631 Anemia in chronic kidney disease: Secondary | ICD-10-CM | POA: Diagnosis present

## 2013-01-10 DIAGNOSIS — Z87891 Personal history of nicotine dependence: Secondary | ICD-10-CM

## 2013-01-10 DIAGNOSIS — R5381 Other malaise: Secondary | ICD-10-CM

## 2013-01-10 DIAGNOSIS — Q602 Renal agenesis, unspecified: Secondary | ICD-10-CM

## 2013-01-10 DIAGNOSIS — I252 Old myocardial infarction: Secondary | ICD-10-CM

## 2013-01-10 DIAGNOSIS — Z6829 Body mass index (BMI) 29.0-29.9, adult: Secondary | ICD-10-CM

## 2013-01-10 DIAGNOSIS — E785 Hyperlipidemia, unspecified: Secondary | ICD-10-CM

## 2013-01-10 DIAGNOSIS — E875 Hyperkalemia: Secondary | ICD-10-CM

## 2013-01-10 DIAGNOSIS — I498 Other specified cardiac arrhythmias: Secondary | ICD-10-CM

## 2013-01-10 DIAGNOSIS — N186 End stage renal disease: Secondary | ICD-10-CM

## 2013-01-10 DIAGNOSIS — R195 Other fecal abnormalities: Secondary | ICD-10-CM

## 2013-01-10 DIAGNOSIS — D649 Anemia, unspecified: Secondary | ICD-10-CM

## 2013-01-10 DIAGNOSIS — Z8543 Personal history of malignant neoplasm of ovary: Secondary | ICD-10-CM

## 2013-01-10 DIAGNOSIS — I10 Essential (primary) hypertension: Secondary | ICD-10-CM | POA: Diagnosis present

## 2013-01-10 HISTORY — DX: Polyp of colon: K63.5

## 2013-01-10 LAB — RETICULOCYTES
Retic Count, Absolute: 60.8 10*3/uL (ref 19.0–186.0)
Retic Ct Pct: 2.7 % (ref 0.4–3.1)

## 2013-01-10 LAB — CBC WITH DIFFERENTIAL/PLATELET
Basophils Relative: 1 % (ref 0–1)
HCT: 22.8 % — ABNORMAL LOW (ref 36.0–46.0)
Hemoglobin: 7.4 g/dL — ABNORMAL LOW (ref 12.0–15.0)
Lymphocytes Relative: 16 % (ref 12–46)
MCHC: 32.5 g/dL (ref 30.0–36.0)
Monocytes Absolute: 0.2 10*3/uL (ref 0.1–1.0)
Monocytes Relative: 4 % (ref 3–12)
Neutro Abs: 4.3 10*3/uL (ref 1.7–7.7)

## 2013-01-10 LAB — HEMOGLOBIN A1C
Hgb A1c MFr Bld: 5.9 % — ABNORMAL HIGH (ref <5.7)
Mean Plasma Glucose: 123 mg/dL — ABNORMAL HIGH (ref <117)

## 2013-01-10 LAB — GLUCOSE, CAPILLARY: Glucose-Capillary: 42 mg/dL — CL (ref 70–99)

## 2013-01-10 LAB — IRON AND TIBC
TIBC: 223 ug/dL — ABNORMAL LOW (ref 250–470)
UIBC: 178 ug/dL (ref 125–400)

## 2013-01-10 LAB — URINALYSIS, ROUTINE W REFLEX MICROSCOPIC
Bilirubin Urine: NEGATIVE
Specific Gravity, Urine: 1.02 (ref 1.005–1.030)
pH: 7 (ref 5.0–8.0)

## 2013-01-10 LAB — BASIC METABOLIC PANEL
BUN: 51 mg/dL — ABNORMAL HIGH (ref 6–23)
Chloride: 103 mEq/L (ref 96–112)
GFR calc Af Amer: 6 mL/min — ABNORMAL LOW (ref >90)
Potassium: 4.2 mEq/L (ref 3.5–5.1)

## 2013-01-10 LAB — FERRITIN: Ferritin: 954 ng/mL — ABNORMAL HIGH (ref 10–291)

## 2013-01-10 LAB — URINE MICROSCOPIC-ADD ON

## 2013-01-10 LAB — TSH: TSH: 2.7 u[IU]/mL (ref 0.350–4.500)

## 2013-01-10 MED ORDER — DEXTROSE 5 % IV SOLN
1.0000 g | Freq: Once | INTRAVENOUS | Status: AC
  Administered 2013-01-10: 1 g via INTRAVENOUS
  Filled 2013-01-10: qty 10

## 2013-01-10 MED ORDER — INSULIN ASPART 100 UNIT/ML ~~LOC~~ SOLN: 0.0000 [IU] | Freq: Every day | SUBCUTANEOUS | Status: DC

## 2013-01-10 MED ORDER — ONDANSETRON HCL 4 MG/2ML IJ SOLN: 4.0000 mg | Freq: Four times a day (QID) | INTRAMUSCULAR | Status: DC | PRN

## 2013-01-10 MED ORDER — ASPIRIN EC 81 MG PO TBEC
81.0000 mg | DELAYED_RELEASE_TABLET | Freq: Every day | ORAL | Status: DC
  Administered 2013-01-10 – 2013-01-12 (×3): 81 mg via ORAL
  Filled 2013-01-10 (×3): qty 1

## 2013-01-10 MED ORDER — CARVEDILOL 12.5 MG PO TABS
12.5000 mg | ORAL_TABLET | Freq: Two times a day (BID) | ORAL | Status: DC
  Administered 2013-01-10 – 2013-01-12 (×3): 12.5 mg via ORAL
  Filled 2013-01-10 (×3): qty 1

## 2013-01-10 MED ORDER — DEXTROSE 5 % IV SOLN
INTRAVENOUS | Status: AC
  Filled 2013-01-10: qty 10

## 2013-01-10 MED ORDER — ACETAMINOPHEN 650 MG RE SUPP: 650.0000 mg | Freq: Four times a day (QID) | RECTAL | Status: DC | PRN

## 2013-01-10 MED ORDER — INSULIN ASPART 100 UNIT/ML ~~LOC~~ SOLN
0.0000 [IU] | Freq: Three times a day (TID) | SUBCUTANEOUS | Status: DC
Start: 2013-01-10 — End: 2013-01-12
  Administered 2013-01-10: 2 [IU] via SUBCUTANEOUS
  Administered 2013-01-11 – 2013-01-12 (×3): 5 [IU] via SUBCUTANEOUS
  Administered 2013-01-12: 3 [IU] via SUBCUTANEOUS

## 2013-01-10 MED ORDER — ACETAMINOPHEN 325 MG PO TABS: 650.0000 mg | ORAL_TABLET | Freq: Four times a day (QID) | ORAL | Status: DC | PRN

## 2013-01-10 MED ORDER — SODIUM CHLORIDE 0.9 % IJ SOLN: 3.0000 mL | INTRAMUSCULAR | Status: DC | PRN

## 2013-01-10 MED ORDER — DEXTROSE 5 % IV SOLN
1.0000 g | INTRAVENOUS | Status: DC
  Administered 2013-01-10 – 2013-01-11 (×2): 1 g via INTRAVENOUS
  Filled 2013-01-10 (×3): qty 10

## 2013-01-10 MED ORDER — ALPRAZOLAM 1 MG PO TABS
1.0000 mg | ORAL_TABLET | Freq: Three times a day (TID) | ORAL | Status: DC | PRN
  Administered 2013-01-10 – 2013-01-11 (×3): 1 mg via ORAL
  Filled 2013-01-10 (×3): qty 1

## 2013-01-10 MED ORDER — PANTOPRAZOLE SODIUM 40 MG PO TBEC
40.0000 mg | DELAYED_RELEASE_TABLET | Freq: Every day | ORAL | Status: DC
  Administered 2013-01-10 – 2013-01-12 (×3): 40 mg via ORAL
  Filled 2013-01-10 (×3): qty 1

## 2013-01-10 MED ORDER — SIMVASTATIN 20 MG PO TABS
40.0000 mg | ORAL_TABLET | Freq: Every day | ORAL | Status: DC
  Administered 2013-01-10 – 2013-01-11 (×2): 40 mg via ORAL
  Filled 2013-01-10 (×2): qty 2

## 2013-01-10 MED ORDER — SODIUM CHLORIDE 0.9 % IV SOLN: 250.0000 mL | INTRAVENOUS | Status: DC | PRN

## 2013-01-10 MED ORDER — ONDANSETRON HCL 4 MG PO TABS
4.0000 mg | ORAL_TABLET | Freq: Four times a day (QID) | ORAL | Status: DC | PRN
  Administered 2013-01-11: 4 mg via ORAL
  Filled 2013-01-10: qty 1

## 2013-01-10 MED ORDER — SODIUM CHLORIDE 0.9 % IJ SOLN
3.0000 mL | Freq: Two times a day (BID) | INTRAMUSCULAR | Status: DC
  Administered 2013-01-10 – 2013-01-12 (×4): 3 mL via INTRAVENOUS

## 2013-01-10 NOTE — ED Notes (Signed)
Pt c/o weakness and intermittent nausea x1 week. Pt states weakness has progressively worsened. Pt denies SOB, chest pain, dizziness, headache. Pt is ambulatory, A&O x4.

## 2013-01-10 NOTE — H&P (Signed)
[facility] History and Physical  [name] ZHY:[id number] DOB: May 06, 1945 DOA: 01/10/2013  Referring physician: Dr. Adriana Simas, ER physician PCP: [name], MD  Specialists: Nephrologist: Dr. Kristian Covey  Chief Complaint: Generalized weakness  HPI: [name] is a 67 y.o. female who has end-stage renal disease on peritoneal dialysis, presents to the emergency room with generalized weakness. Symptoms have been present for approximately one week now. She denies any fever, cough, vomiting or diarrhea. She feels that her generalized weakness and fatigue progressively been getting worse. She has not noticed any melena or hematochezia. She does occasionally feel short of breath during dialysis when fluid is being infused, but this usually resolves once she has completed dialysis. She presents to the emergency room for evaluation where her urinalysis was indicative of infection. Hemoglobin was also found to be low at 7.4. It is unclear what her baseline level is. She does have a level of 12.3 from 6/14. Prior levels from 11/13 range between 7.8-8.8. Patient will be admitted to the hospital for further treatment.  Review of Systems: Pertinent positive as per history of present illness, otherwise negative  Past Medical History  Diagnosis Date  . Diabetes mellitus   . Stroke 2010  . Hypertension   . Hyperlipidemia   . Coronary artery disease   . Dialysis patient     T/[facility] Fausto Skillern, stops hemodialysis 11-28-2012, will do peritoneal dialysis  starting 12-01-2012   . Carpal tunnel syndrome   . Anemia   . Myocardial infarction 2009  . CKD (chronic kidney disease)   . Ovarian cancer 1995    [facility]   Past Surgical History  Procedure Laterality Date  . Back surgery  2009  . Complete abdominal hysterectomy  1995  . Abdominal hysterectomy    . Colonoscopy with esophagogastroduodenoscopy (egd)  01/27/2012    Procedure: COLONOSCOPY WITH  ESOPHAGOGASTRODUODENOSCOPY (EGD);  Surgeon: Corbin Ade, MD;  [facility];  Service: Endoscopy;  Laterality: N/A;  1:45  . Cardiac catheterization    . Eus  04/09/2012    Procedure: UPPER ENDOSCOPIC ULTRASOUND (EUS) LINEAR;  Surgeon: Rachael Fee, MD;  Location: [facility];  Service: Endoscopy;  Laterality: N/A;  . Tonsillectomy      child  . Peritoneal catheter insertion  10/30/2012    for peritoneal dialysis   Social History:  reports that she has quit smoking. She has never used smokeless tobacco. She reports that she does not drink alcohol or use illicit drugs.   Allergies  Allergen Reactions  . Lisinopril Swelling    Family History  Problem Relation Age of Onset  . Diabetes Mother      Prior to Admission medications   Medication Sig Start Date End Date Taking? Authorizing Provider  ALPRAZolam Prudy Feeler) 1 MG tablet Take 1 mg by mouth 3 (three) times daily as needed for anxiety.    Historical Provider, MD  aspirin EC 81 MG tablet Take 81 mg by mouth daily.    Historical Provider, MD  b complex-vitamin c-folic acid (NEPHRO-VITE) 0.8 MG TABS tablet Take 0.8 mg by mouth at bedtime.    Historical Provider, MD  carvedilol (COREG) 12.5 MG tablet Take 12.5 mg by mouth 2 (two) times daily with a meal.    Historical Provider, MD  EPINEPHrine (EPIPEN) 0.3 mg/0.3 mL DEVI Inject 0.3 mLs (0.3 mg total) into the muscle once. 08/30/12   Hollice Espy, MD  Insulin Isophane & Regular (HUMULIN 70/30 [place]) Inject 15-20 Units into  the skin 2 (two) times daily. 20 units in the morning   15 units at night    Historical Provider, MD  pantoprazole (PROTONIX) 40 MG tablet Take 1 tablet (40 mg total) by mouth daily. 30 minutes before breakfast. 09/14/12   Tiffany Kocher, PA-C  simvastatin (ZOCOR) 40 MG tablet Take 40 mg by mouth at bedtime.    Historical Provider, MD   Physical Exam: Filed Vitals:   01/10/13 1357  BP: 154/90  Pulse: 76  Temp:   Resp: 23     General:  NAD  Eyes:  Pupils are equal, round, react to light,  ENT: Mucous membranes are dry  Neck: Supple  Cardiovascular: S1, S2, regular rate and rhythm  Respiratory: Clear to auscultation bilaterally  Abdomen: Soft, nontender, positive bowel sounds, peritoneal dialysis catheter intact without any surrounding erythema, warmth or tenderness  Skin: No rashes, skin appears pale  Musculoskeletal: No pedal edema bilaterally  Psychiatric: Normal affect, cooperative with exam  Neurologic: Grossly intact, nonfocal  Labs on Admission:  Basic Metabolic Panel:  Recent Labs Lab 01/10/13 0954  NA 141  K 4.2  CL 103  CO2 29  GLUCOSE 133*  BUN 51*  CREATININE 7.36*  CALCIUM 8.7   Liver Function Tests: No results found for this basename: AST, ALT, ALKPHOS, BILITOT, PROT, ALBUMIN,  in the last 168 hours No results found for this basename: LIPASE, AMYLASE,  in the last 168 hours No results found for this basename: AMMONIA,  in the last 168 hours CBC:  Recent Labs Lab 01/10/13 0954  WBC 5.5  NEUTROABS 4.3  HGB 7.4*  HCT 22.8*  MCV 103.6*  PLT 151   Cardiac Enzymes: No results found for this basename: CKTOTAL, CKMB, CKMBINDEX, TROPONINI,  in the last 168 hours  BNP (last 3 results) No results found for this basename: PROBNP,  in the last 8760 hours CBG:  Recent Labs Lab 01/10/13 1314 01/10/13 1356  GLUCAP 42* 84    Radiological Exams on Admission: No results found.  EKG: Independently reviewed. No acute ST-T changes  Assessment/Plan Active Problems:   DM   HYPERTENSION   ESRD (end stage renal disease)   Anemia   UTI (lower urinary tract infection)   Generalized weakness   1. Generalized weakness. Appears to be multifactorial, secondary to anemia as well as urinary tract infection. 2. Urinary tract infection. Patient was started on Rocephin. Followup urine cultures.  3. Chronic anemia, suspect anemia of chronic disease. She does not have any evidence of bleeding. We'll  check anemia panel as well as stool for occult blood. Transfuse 1 unit of PRBC at this time for symptomatic anemia. 4. End-stage renal disease. Although patient has daily peritoneal dialysis at home, she still has a viable vascular access for hemodialysis. She'll be admitted to [facility] and may undergo hemodialysis per nephrology. 5. Diabetes. Patient is hypoglycemic in the emergency room due to decreased by mouth intake today. Will hold her regular insulin and provide sliding scale for now.  Code Status: full code Family Communication: discussed with patient and husband at the bedside Disposition Plan: discharge home once improved  Time spent:  Miken Stecher [facility] Pager [phone]  If 7PM-7AM, please contact night-coverage [email or web address] Password [email or web address] 01/10/2013, 2:01 PM

## 2013-01-10 NOTE — ED Provider Notes (Signed)
CSN: [id number]     Arrival date & time 01/10/13  4696 History  This chart was scribed for [name] Hutching, MD by Karle Plumber, ED Scribe. This patient was seen in room APA02/APA02 and the patient's care was started at 9:17 AM.  Chief Complaint  Patient presents with  . Weakness   The history is provided by the patient. No language interpreter was used.   HPI Comments:  [name] is a 67 y.o. female on peritoneal dialysis with h/o DM and being born with one kidney who presents to the Emergency Department complaining of due to generalized weakness onset approximately one week ago. She states she went out of town to [place] and upon returning has been feeling more and more tired. She denies any appetite change, but states she she does not drink liquids frequently secondary to dialysis and not wanting to hold on to too much fluid. She denies CP and any other symptoms. Pt states her PCP Dr. Durwin Nora with [facility].  Past Medical History  Diagnosis Date  . Diabetes mellitus   . Stroke 2010  . Hypertension   . Hyperlipidemia   . Coronary artery disease   . Dialysis patient     T/Th/Sat [facility]-Dr Fausto Skillern, stops hemodialysis 11-28-2012, will do peritoneal dialysis  starting 12-01-2012   . Carpal tunnel syndrome   . Anemia   . Myocardial infarction 2009  . CKD (chronic kidney disease)   . Ovarian cancer 1995    [facility]   Past Surgical History  Procedure Laterality Date  . Back surgery  2009  . Complete abdominal hysterectomy  1995  . Abdominal hysterectomy    . Colonoscopy with esophagogastroduodenoscopy (egd)  01/27/2012    Procedure: COLONOSCOPY WITH ESOPHAGOGASTRODUODENOSCOPY (EGD);  Surgeon: Corbin Ade, MD;  Location: [facility];  Service: Endoscopy;  Laterality: N/A;  1:45  . Cardiac catheterization    . Eus  04/09/2012    Procedure: UPPER ENDOSCOPIC ULTRASOUND (EUS) LINEAR;  Surgeon: Rachael Fee, MD;  Location: [facility];   Service: Endoscopy;  Laterality: N/A;  . Tonsillectomy      child  . Peritoneal catheter insertion  10/30/2012    for peritoneal dialysis   Family History  Problem Relation Age of Onset  . Diabetes Mother    History  Substance Use Topics  . Smoking status: Former Smoker -- 0.50 packs/day for 15 years  . Smokeless tobacco: Never Used  . Alcohol Use: No   OB History   Grav Para Term Preterm Abortions TAB SAB Ect Mult Living                 Review of Systems  Constitutional: Negative for appetite change.  Neurological: Positive for weakness (generalized).   A complete 10 system review of systems was obtained and all systems are negative except as noted in the HPI and PMH.   Allergies  Lisinopril  Home Medications   Current Outpatient Rx  Name  Route  Sig  Dispense  Refill  . ALPRAZolam (XANAX) 1 MG tablet   Oral   Take 1 mg by mouth 3 (three) times daily as needed for anxiety.         Marland Kitchen aspirin EC 81 MG tablet   Oral   Take 81 mg by mouth daily.         Marland Kitchen b complex-vitamin c-folic acid (NEPHRO-VITE) 0.8 MG TABS tablet   Oral   Take 0.8 mg by mouth at  bedtime.         . carvedilol (COREG) 12.5 MG tablet   Oral   Take 12.5 mg by mouth 2 (two) times daily with a meal.         . EPINEPHrine (EPIPEN) 0.3 mg/0.3 mL DEVI   Intramuscular   Inject 0.3 mLs (0.3 mg total) into the muscle once.   1 Device   1   . Insulin Isophane & Regular (HUMULIN 70/30 [place])   Subcutaneous   Inject 15-20 Units into the skin 2 (two) times daily. 20 units in the morning   15 units at night         . pantoprazole (PROTONIX) 40 MG tablet   Oral   Take 1 tablet (40 mg total) by mouth daily. 30 minutes before breakfast.   30 tablet   11   . simvastatin (ZOCOR) 40 MG tablet   Oral   Take 40 mg by mouth at bedtime.          Triage Vitals: BP 161/73  Pulse 76  Temp(Src) 98.2 F (36.8 C) (Oral)  Resp 15  Ht 5\' 5"  (1.651 m)  Wt 179 lb (81.194 kg)  BMI 29.79 kg/m2   SpO2 98% Physical Exam  Nursing note and vitals reviewed. Constitutional: She is oriented to person, place, and time. She appears well-developed and well-nourished.  HENT:  Head: Normocephalic and atraumatic.  Eyes: Conjunctivae and EOM are normal. Pupils are equal, round, and reactive to light.  Neck: Normal range of motion. Neck supple.  Cardiovascular: Normal rate, regular rhythm and normal heart sounds.   Pulmonary/Chest: Effort normal and breath sounds normal.  Abdominal: Soft. Bowel sounds are normal.  Peritoneal catheter in right abdomen with no abnormal signs,  Musculoskeletal: Normal range of motion.  Neurological: She is alert and oriented to person, place, and time.  Skin: Skin is warm and dry. There is pallor (slightly).  Psychiatric: She has a normal mood and affect.    ED Course  Procedures (including critical care time) DIAGNOSTIC STUDIES: Oxygen Saturation is 98% on RA, normal by my interpretation.   COORDINATION OF CARE: 9:22 AM- Will obtain lab work. Pt verbalizes understanding and agrees to plan.  Medications - No data to display  Labs Review Labs Reviewed  BASIC METABOLIC PANEL - Abnormal; Notable for the following:    Glucose, Bld 133 (*)    BUN 51 (*)    Creatinine, Ser 7.36 (*)    GFR calc non Af Amer 5 (*)    GFR calc Af Amer 6 (*)    All other components within normal limits  CBC WITH DIFFERENTIAL - Abnormal; Notable for the following:    RBC 2.20 (*)    Hemoglobin 7.4 (*)    HCT 22.8 (*)    MCV 103.6 (*)    Neutrophils Relative % 78 (*)    All other components within normal limits  URINALYSIS, ROUTINE W REFLEX MICROSCOPIC - Abnormal; Notable for the following:    APPearance CLOUDY (*)    Hgb urine dipstick SMALL (*)    Protein, ur 100 (*)    Nitrite POSITIVE (*)    Leukocytes, UA SMALL (*)    All other components within normal limits  URINE MICROSCOPIC-ADD ON - Abnormal; Notable for the following:    Squamous Epithelial / LPF MANY (*)     Bacteria, UA MANY (*)    All other components within normal limits  URINE CULTURE   Imaging Review No results found.  EKG Interpretation   None       Date: 01/10/2013  Rate: 76  Rhythm: normal sinus rhythm  QRS Axis: left  Intervals: normal  ST/T Wave abnormalities: normal  Conduction Disutrbances: none  Narrative Interpretation: unremarkable    MDM  No diagnosis found. End stage renal disease on home peritoneal dialysis feeling weak for approximately one week. Urinalysis shows 10-20 white cells. She is also anemic with a hemoglobin of 7.4. IV Rocephin. Urine culture. Transfer to [facility].   I personally performed the services described in this documentation, which was scribed in my presence. The recorded information has been reviewed and is accurate.    [name] Hutching, MD 01/10/13 1320

## 2013-01-11 ENCOUNTER — Encounter (HOSPITAL_COMMUNITY): Payer: Self-pay | Admitting: Internal Medicine

## 2013-01-11 DIAGNOSIS — E119 Type 2 diabetes mellitus without complications: Secondary | ICD-10-CM

## 2013-01-11 LAB — BASIC METABOLIC PANEL
BUN: 51 mg/dL — ABNORMAL HIGH (ref 6–23)
Calcium: 8.9 mg/dL (ref 8.4–10.5)
Chloride: 100 mEq/L (ref 96–112)
Creatinine, Ser: 7.09 mg/dL — ABNORMAL HIGH (ref 0.50–1.10)
GFR calc Af Amer: 6 mL/min — ABNORMAL LOW (ref >90)
Sodium: 137 mEq/L (ref 135–145)

## 2013-01-11 LAB — CBC
HCT: 26.9 % — ABNORMAL LOW (ref 36.0–46.0)
MCHC: 33.5 g/dL (ref 30.0–36.0)
MCV: 98.9 fL (ref 78.0–100.0)
Platelets: 165 10*3/uL (ref 150–400)
RBC: 2.72 MIL/uL — ABNORMAL LOW (ref 3.87–5.11)
RDW: 17.6 % — ABNORMAL HIGH (ref 11.5–15.5)
WBC: 6.7 10*3/uL (ref 4.0–10.5)

## 2013-01-11 LAB — GLUCOSE, CAPILLARY
Glucose-Capillary: 218 mg/dL — ABNORMAL HIGH (ref 70–99)
Glucose-Capillary: 97 mg/dL (ref 70–99)

## 2013-01-11 LAB — OCCULT BLOOD, POC DEVICE: Fecal Occult Bld: POSITIVE — AB

## 2013-01-11 LAB — TROPONIN I: Troponin I: <0.3 ng/mL (ref <0.30)

## 2013-01-11 MED ORDER — HEPARIN SODIUM (PORCINE) 1000 UNIT/ML DIALYSIS
20.0000 [IU]/kg | INTRAMUSCULAR | Status: DC | PRN
  Filled 2013-01-11: qty 2

## 2013-01-11 MED ORDER — ALTEPLASE 2 MG IJ SOLR
2.0000 mg | Freq: Once | INTRAMUSCULAR | Status: AC | PRN
  Filled 2013-01-11: qty 2

## 2013-01-11 MED ORDER — HEPARIN SODIUM (PORCINE) 1000 UNIT/ML DIALYSIS
300.0000 [IU] | INTRAMUSCULAR | Status: DC | PRN
  Filled 2013-01-11: qty 1

## 2013-01-11 MED ORDER — PENTAFLUOROPROP-TETRAFLUOROETH EX AERO
1.0000 "application " | INHALATION_SPRAY | CUTANEOUS | Status: DC | PRN
  Filled 2013-01-11: qty 103.5

## 2013-01-11 MED ORDER — HEPARIN SODIUM (PORCINE) 1000 UNIT/ML DIALYSIS
1000.0000 [IU] | INTRAMUSCULAR | Status: DC | PRN
  Filled 2013-01-11: qty 1

## 2013-01-11 MED ORDER — LIDOCAINE HCL (PF) 1 % IJ SOLN: 5.0000 mL | INTRAMUSCULAR | Status: DC | PRN

## 2013-01-11 MED ORDER — SODIUM CHLORIDE 0.9 % IV SOLN: 100.0000 mL | INTRAVENOUS | Status: DC | PRN

## 2013-01-11 MED ORDER — NEPRO/CARBSTEADY PO LIQD: 237.0000 mL | ORAL | Status: DC | PRN

## 2013-01-11 MED ORDER — INSULIN GLARGINE 100 UNIT/ML ~~LOC~~ SOLN
7.0000 [IU] | Freq: Every day | SUBCUTANEOUS | Status: DC
  Administered 2013-01-11: 7 [IU] via SUBCUTANEOUS
  Filled 2013-01-11 (×2): qty 0.07

## 2013-01-11 MED ORDER — LIDOCAINE-PRILOCAINE 2.5-2.5 % EX CREA: 1.0000 "application " | TOPICAL_CREAM | CUTANEOUS | Status: DC | PRN

## 2013-01-11 NOTE — Consult Note (Signed)
Reason for Consult: End-stage renal disease Referring Physician: Dr. Carin [name] is an 67 y.o. female.  HPI: She is a patient was history of hypertension, diabetes, end-stage renal disease on peritoneal dialysis presently came with complaints of severe weakness and difficulty in breathing when she is lying down. According to the patient the last couple of days she started feeling weak and tired. At night when she is being her exchange she started having difficulty breathing. When she sits up she feels better. Patient was able to maintain her dry weight and she didn't have any swelling. She denies any nausea vomiting. Presently She is feeling better after receiving a unit of blood transfusion.   Past Medical History  Diagnosis Date  . Diabetes mellitus   . Stroke 2010  . Hypertension   . Hyperlipidemia   . Coronary artery disease   . Dialysis patient     T/Th/[facility] Faust[facility]ill[facility], stops hemodialysis 11-28-2012, will do peritoneal dialysis  starting 12-01-2012   . Carpal tunnel syndrome   . Anemia   . Myocardial infarction 2009  . CKD (chronic kidney disease)   . Ovarian cancer 1995    [name]    Past Surgical History  Procedure Laterality Date  . Back surgery  2009  . Complete abdominal hysterectomy  1995  . Abdominal hysterectomy    . Colonoscopy with esophagogastroduodenoscopy (egd)  01/27/2012    Procedure: COLONOSCOPY WITH ESOPHAGOGASTRODUODENOSCOPY (EGD);  Surgeon: Corbin Ade, MD;  [facility];  Service: Endoscopy;  Laterality: N/A;  1:45  . Cardiac catheterization    . Eus  04/09/2012    Procedure: UPPER ENDOSCOPIC ULTRASOUND (EUS) LINEAR;  Surgeon: Rachael Fee, MD;  Location: [facility];  Service: Endoscopy;  Laterality: N/A;  . Tonsillectomy      child  . Peritoneal catheter insertion  10/30/2012    for peritoneal dialysis    Family History  Problem Relation Age of Onset  . Diabetes Mother     Social History:   reports that she has quit smoking. She has never used smokeless tobacco. She reports that she does not drink alcohol or use illicit drugs.  Allergies:  Allergies  Allergen Reactions  . Lisinopril Swelling    Medications: I have reviewed the patient's current medications.  Results for orders placed during the hospital encounter of 01/10/13 (from the past 48 hour(s))  BASIC METABOLIC PANEL     Status: Abnormal   Collection Time    01/10/13  9:54 AM      Result Value Range   Sodium 141  135 - 145 mEq/L   Potassium 4.2  3.5 - 5.1 mEq/L   Chloride 103  96 - 112 mEq/L   CO2 29  19 - 32 mEq/L   Glucose, Bld 133 (*) 70 - 99 mg/dL   BUN 51 (*) 6 - 23 mg/dL   Creatinine, Ser 7.82 (*) 0.50 - 1.10 mg/dL   Calcium 8.7  8.4 - 95.6 mg/dL   GFR calc non Af Amer 5 (*) >90 mL/min   GFR calc Af Amer 6 (*) >90 mL/min   Comment: (NOTE)     The eGFR has been calculated using the CKD EPI equation.     This calculation has not been validated in all clinical situations.     eGFR's persistently <90 mL/min signify possible Chronic Kidney     Disease.  CBC WITH DIFFERENTIAL     Status: Abnormal   Collection Time  01/10/13  9:54 AM      Result Value Range   WBC 5.5  4.0 - 10.5 K/uL   RBC 2.20 (*) 3.87 - 5.11 MIL/uL   Hemoglobin 7.4 (*) 12.0 - 15.0 g/dL   HCT 21.3 (*) 08.6 - 57.8 %   MCV 103.6 (*) 78.0 - 100.0 fL   MCH 33.6  26.0 - 34.0 pg   MCHC 32.5  30.0 - 36.0 g/dL   RDW 46.9  62.9 - 52.8 %   Platelets 151  150 - 400 K/uL   Neutrophils Relative % 78 (*) 43 - 77 %   Neutro Abs 4.3  1.7 - 7.7 K/uL   Lymphocytes Relative 16  12 - 46 %   Lymphs Abs 0.9  0.7 - 4.0 K/uL   Monocytes Relative 4  3 - 12 %   Monocytes Absolute 0.2  0.1 - 1.0 K/uL   Eosinophils Relative 2  0 - 5 %   Eosinophils Absolute 0.1  0.0 - 0.7 K/uL   Basophils Relative 1  0 - 1 %   Basophils Absolute 0.0  0.0 - 0.1 K/uL  URINALYSIS, ROUTINE W REFLEX MICROSCOPIC     Status: Abnormal   Collection Time    01/10/13 11:02 AM       Result Value Range   Color, Urine YELLOW  YELLOW   APPearance CLOUDY (*) CLEAR   Specific Gravity, Urine 1.020  1.005 - 1.030   pH 7.0  5.0 - 8.0   Glucose, UA NEGATIVE  NEGATIVE mg/dL   Hgb urine dipstick SMALL (*) NEGATIVE   Bilirubin Urine NEGATIVE  NEGATIVE   Ketones, ur NEGATIVE  NEGATIVE mg/dL   Protein, ur 413 (*) NEGATIVE mg/dL   Urobilinogen, UA 0.2  0.0 - 1.0 mg/dL   Nitrite POSITIVE (*) NEGATIVE   Leukocytes, UA SMALL (*) NEGATIVE  URINE MICROSCOPIC-ADD ON     Status: Abnormal   Collection Time    01/10/13 11:02 AM      Result Value Range   Squamous Epithelial / LPF MANY (*) RARE   WBC, UA 11-20  <3 WBC/hpf   RBC / HPF 3-6  <3 RBC/hpf   Bacteria, UA MANY (*) RARE  GLUCOSE, CAPILLARY     Status: Abnormal   Collection Time    01/10/13  1:14 PM      Result Value Range   Glucose-Capillary 42 (*) 70 - 99 mg/dL  GLUCOSE, CAPILLARY     Status: None   Collection Time    01/10/13  1:56 PM      Result Value Range   Glucose-Capillary 84  70 - 99 mg/dL  VITAMIN K44     Status: None   Collection Time    01/10/13  2:37 PM      Result Value Range   Vitamin B-12 445  211 - 911 pg/mL   Comment: Performed at [facility]  FOLATE     Status: None   Collection Time    01/10/13  2:37 PM      Result Value Range   Folate >20.0     Comment: (NOTE)     Reference Ranges            Deficient:       0.4 - 3.3 ng/mL            Indeterminate:   3.4 - 5.4 ng/mL            Normal:              >  5.4 ng/mL     Performed at [facility]  IRON AND TIBC     Status: Abnormal   Collection Time    01/10/13  2:37 PM      Result Value Range   Iron 45  42 - 135 ug/dL   TIBC 454 (*) 098 - 119 ug/dL   Saturation Ratios 20  20 - 55 %   UIBC 178  125 - 400 ug/dL   Comment: Performed at [facility]  FERRITIN     Status: Abnormal   Collection Time    01/10/13  2:37 PM      Result Value Range   Ferritin 954 (*) 10 - 291 ng/mL   Comment: Performed at [facility]  RETICULOCYTES     Status: Abnormal   Collection Time    01/10/13  2:37 PM      Result Value Range   Retic Ct Pct 2.7  0.4 - 3.1 %   RBC. 2.25 (*) 3.87 - 5.11 MIL/uL   Retic Count, Manual 60.8  19.0 - 186.0 K/uL  TSH     Status: None   Collection Time    01/10/13  2:37 PM      Result Value Range   TSH 2.700  0.350 - 4.500 uIU/mL   Comment: Performed at [facility]  TROPONIN I     Status: None   Collection Time    01/10/13  2:37 PM      Result Value Range   Troponin I <0.30  <0.30 ng/mL   Comment:            Due to the release kinetics of cTnI,     a negative result within the first hours     of the onset of symptoms does not rule out     myocardial infarction with certainty.     If myocardial infarction is still suspected,     repeat the test at appropriate intervals.  TYPE AND SCREEN     Status: None   Collection Time    01/10/13  2:37 PM      Result Value Range   ABO/RH(D) B POS     Antibody Screen NEG     Sample Expiration 01/13/2013     Unit Number [id number]     Blood Component Type RED CELLS,LR     Unit division 00     Status of Unit ALLOCATED     Transfusion Status OK TO TRANSFUSE     Crossmatch Result Compatible     Unit Number [id number]     Blood Component Type RBC LR PHER1     Unit division 00     Status of Unit ISSUED,FINAL     Transfusion Status OK TO TRANSFUSE     Crossmatch Result Compatible    PREPARE RBC (CROSSMATCH)     Status: None   Collection Time    01/10/13  2:37 PM      Result Value Range   Order Confirmation ORDER PROCESSED BY BLOOD BANK    HEMOGLOBIN A1C     Status: Abnormal   Collection Time    01/10/13  2:37 PM      Result Value Range   Hemoglobin A1C 5.9 (*) <5.7 %   Comment: (NOTE)  According to the ADA Clinical Practice Recommendations for 2011, when     HbA1c is used as a screening test:      >=6.5%   Diagnostic of Diabetes  Mellitus               (if abnormal result is confirmed)     5.7-6.4%   Increased risk of developing Diabetes Mellitus     References:Diagnosis and Classification of Diabetes Mellitus,Diabetes     Care,2011,34(Suppl 1):S62-S69 and Standards of Medical Care in             Diabetes - 2011,Diabetes Care,2011,34 (Suppl 1):S11-S61.   Mean Plasma Glucose 123 (*) <117 mg/dL   Comment: Performed at [facility]  GLUCOSE, CAPILLARY     Status: Abnormal   Collection Time    01/10/13  4:41 PM      Result Value Range   Glucose-Capillary 134 (*) 70 - 99 mg/dL   Comment 1 Notify RN     Comment 2 Documented in Chart    GLUCOSE, CAPILLARY     Status: Abnormal   Collection Time    01/10/13  9:00 PM      Result Value Range   Glucose-Capillary 61 (*) 70 - 99 mg/dL   Comment 1 Notify RN     Comment 2 Documented in Chart    TROPONIN I     Status: None   Collection Time    01/10/13  9:02 PM      Result Value Range   Troponin I <0.30  <0.30 ng/mL   Comment:            Due to the release kinetics of cTnI,     a negative result within the first hours     of the onset of symptoms does not rule out     myocardial infarction with certainty.     If myocardial infarction is still suspected,     repeat the test at appropriate intervals.  GLUCOSE, CAPILLARY     Status: Abnormal   Collection Time    01/10/13 10:59 PM      Result Value Range   Glucose-Capillary 201 (*) 70 - 99 mg/dL   Comment 1 Notify RN     Comment 2 Documented in Chart    TROPONIN I     Status: None   Collection Time    01/11/13  1:50 AM      Result Value Range   Troponin I <0.30  <0.30 ng/mL   Comment:            Due to the release kinetics of cTnI,     a negative result within the first hours     of the onset of symptoms does not rule out     myocardial infarction with certainty.     If myocardial infarction is still suspected,     repeat the test at appropriate intervals.  BASIC METABOLIC PANEL     Status: Abnormal    Collection Time    01/11/13  1:50 AM      Result Value Range   Sodium 137  135 - 145 mEq/L   Potassium 4.7  3.5 - 5.1 mEq/L   Chloride 100  96 - 112 mEq/L   CO2 25  19 - 32 mEq/L   Glucose, Bld 202 (*) 70 - 99 mg/dL   BUN 51 (*) 6 - 23 mg/dL   Creatinine, Ser 1.61 (*) 0.50 - 1.10 mg/dL   Calcium  8.9  8.4 - 10.5 mg/dL   GFR calc non Af Amer 5 (*) >90 mL/min   GFR calc Af Amer 6 (*) >90 mL/min   Comment: (NOTE)     The eGFR has been calculated using the CKD EPI equation.     This calculation has not been validated in all clinical situations.     eGFR's persistently <90 mL/min signify possible Chronic Kidney     Disease.  CBC     Status: Abnormal   Collection Time    01/11/13  1:50 AM      Result Value Range   WBC 6.7  4.0 - 10.5 K/uL   RBC 2.72 (*) 3.87 - 5.11 MIL/uL   Hemoglobin 9.0 (*) 12.0 - 15.0 g/dL   Comment: POST TRANSFUSION SPECIMEN   HCT 26.9 (*) 36.0 - 46.0 %   MCV 98.9  78.0 - 100.0 fL   MCH 33.1  26.0 - 34.0 pg   MCHC 33.5  30.0 - 36.0 g/dL   RDW 45.4 (*) 09.8 - 11.9 %   Platelets 165  150 - 400 K/uL  GLUCOSE, CAPILLARY     Status: Abnormal   Collection Time    01/11/13  7:49 AM      Result Value Range   Glucose-Capillary 218 (*) 70 - 99 mg/dL   Comment 1 Documented in Chart     Comment 2 Notify RN      X-ray Chest Pa And Lateral   01/10/2013   CLINICAL DATA:  weakness.  EXAM: CHEST  2 VIEW  COMPARISON:  01/23/2012  FINDINGS: Cardiomegaly. No overt edema. Linear scarring in the left base. Hyperinflation of the lungs compatible with COPD. No effusions. No acute bony abnormality.  IMPRESSION: COPD/chronic changes. Cardiomegaly. No overt failure.   Electronically Signed   By: Charlett Nose M.D.   On: 01/10/2013 14:57    Review of Systems  Cardiovascular: Positive for orthopnea and PND. Negative for chest pain and leg swelling.  Gastrointestinal: Negative for nausea, vomiting, abdominal pain and diarrhea.  Neurological: Positive for dizziness and weakness.    Blood pressure 108/75, pulse 80, temperature 98.8 F (37.1 C), temperature source Oral, resp. rate 15, height 5\' 5"  (1.651 m), weight 83.3 kg (183 lb 10.3 oz), SpO2 97.00%. Physical Exam  Constitutional: No distress.  Eyes: No scleral icterus.  Neck: No JVD present.  Cardiovascular: Normal rate and regular rhythm.   Respiratory: No respiratory distress. She has no wheezes. She has no rales.  GI: She exhibits distension. There is no tenderness. There is no rebound.  Musculoskeletal: She exhibits no edema.    Assessment/Plan:  problem #1 weakness possibly secondary to her anemia. Patient has received a unit of blood transfusion and she is feeling better. Patient denies any history of GI bleeding. Problem #2 difficulty breathing when she is lying down possibly secondary to mechanical pressure as she is a new dialysis patient. The difficulty breathing is when she is lying down and drained peritoneal dialysis exchange. Problem #3 hypertension her blood pressure seems to be reasonably controlled Problem #4 anemia etiology not clear. Patient denies any history of blood loss. Problem #5 coronary disease she is a symptomatic Problem #6 history of diabetes Problem #7 UTI Problem #8 metabolic bone disease her calcium is was in acceptable range. Plan: We'll make arrangements for patient to get dialysis today           We'll try to remove about 3 L.  We'll check her basic metabolic panel and phosphorus in the morning.   Rahim Astorga S 01/11/2013, 8:23 AM

## 2013-01-11 NOTE — Progress Notes (Signed)
UR Chart Review Completed  

## 2013-01-11 NOTE — Procedures (Signed)
   HEMODIALYSIS TREATMENT NOTE:  3.5 hour heparin free dialysis treatment ordered.  When patient had 1.5 hour remaining, she requested to ask nephrologist to decrease treatment time to 3 hours.  "I usually only ran 3 hours at the clinic."  Dr. Kristian Covey agreed.  Pt then complained of abdominal cramping and asked for ultrafiltration to be discontinued.  Even after abdominal cramps subsided pt was reluctant to resume UF.  Only 1.8 liters was removed (of 3L prescribed goal), as ultrafiltration was interrupted x 30 minutes and treatment was decreased by 30 minutes.  Lungs clear anteriorly, no peripheral edema.  No complaints.  All blood was reinfused and hemostasis was achieved within 15 minutes.  Report called to Norva Karvonen, RN.  Dr. Kristian Covey was made aware of net UF.  Wyvonne Carda L. Littleton Haub, RN, CDN

## 2013-01-11 NOTE — Progress Notes (Signed)
[facility] PROGRESS NOTE  KELLIANNE EK ZOX:[id number] DOB: [birth year] DOA: 01/10/2013 PCP: [name], MD    Code Status: Full code Family Communication: Discussed with her husband Disposition Plan: Anticipate discharge to home   Consultants:  Nephrology  Procedures:  Hemodialysis  Antibiotics:  Rocephin 01/10/2013  HPI/Subjective: The patient is lying in bed. She has no complaints of weakness, chest pain, chest congestion, or pain with urination.  Objective: Filed Vitals:   01/11/13 0500  BP: 108/75  Pulse: 80  Temp: 98.8 F (37.1 C)  Resp: 15    Intake/Output Summary (Last 24 hours) at 01/11/13 1137 Last data filed at 01/11/13 0930  Gross per 24 hour  Intake    440 ml  Output      0 ml  Net    440 ml   Filed Weights   01/10/13 0914 01/10/13 1420  Weight: 81.194 kg (179 lb) 83.3 kg (183 lb 10.3 oz)    Exam:   General:  Pleasant alert obese 67 year old Caucasian woman laying in bed, in no acute distress.  Cardiovascular: S1, S2, with a soft systolic murmur.  Respiratory: Clear anteriorly with decreased breath sounds in the bases.  Abdomen: Obese, positive bowel sounds, soft, nontender, nondistended.  Extremities: No pedal edema.  Musculoskeletal: No acute hot red joints.  Neurologic: She is alert and oriented x3. Cranial nerves II through XII are intact. Strength globally 5 minus over 5 in the supine position.  Data Reviewed: Basic Metabolic Panel:  Recent Labs Lab 01/10/13 0954 01/11/13 0150  NA 141 137  K 4.2 4.7  CL 103 100  CO2 29 25  GLUCOSE 133* 202*  BUN 51* 51*  CREATININE 7.36* 7.09*  CALCIUM 8.7 8.9   Liver Function Tests: No results found for this basename: AST, ALT, ALKPHOS, BILITOT, PROT, ALBUMIN,  in the last 168 hours No results found for this basename: LIPASE, AMYLASE,  in the last 168 hours No results found for this basename: AMMONIA,  in the last 168 hours CBC:  Recent Labs Lab 01/10/13 0954  01/11/13 0150  WBC 5.5 6.7  NEUTROABS 4.3  --   HGB 7.4* 9.0*  HCT 22.8* 26.9*  MCV 103.6* 98.9  PLT 151 165   Cardiac Enzymes:  Recent Labs Lab 01/10/13 1437 01/10/13 2102 01/11/13 0150  TROPONINI <0.30 <0.30 <0.30   BNP (last 3 results) No results found for this basename: PROBNP,  in the last 8760 hours CBG:  Recent Labs Lab 01/10/13 1641 01/10/13 2100 01/10/13 2259 01/11/13 0749 01/11/13 1109  GLUCAP 134* 61* 201* 218* 207*    No results found for this or any previous visit (from the past 240 hour(s)).   Studies: X-ray Chest Pa And Lateral   01/10/2013   CLINICAL DATA:  weakness.  EXAM: CHEST  2 VIEW  COMPARISON:  01/23/2012  FINDINGS: Cardiomegaly. No overt edema. Linear scarring in the left base. Hyperinflation of the lungs compatible with COPD. No effusions. No acute bony abnormality.  IMPRESSION: COPD/chronic changes. Cardiomegaly. No overt failure.   Electronically Signed   By: Charlett Nose M.D.   On: 01/10/2013 14:57    Scheduled Meds: . aspirin EC  81 mg Oral Daily  . carvedilol  12.5 mg Oral BID WC  . cefTRIAXone (ROCEPHIN)  IV  1 g Intravenous Q24H  . insulin aspart  0-15 Units Subcutaneous TID WC  . insulin aspart  0-5 Units Subcutaneous QHS  . pantoprazole  40 mg Oral QAC breakfast  . simvastatin  40  mg Oral QHS  . sodium chloride  3 mL Intravenous Q12H   Continuous Infusions:   Assessment:  Principal Problem:   Generalized weakness Active Problems:   Anemia   UTI (lower urinary tract infection)   DM   HYPERTENSION   ESRD (end stage renal disease)  1. Generalized weakness, likely secondary to anemia and urinary tract infection. Her TSH is within normal limits. She is less symptomatic.  Acute on chronic anemia. She denies black tarry stools or bright red blood present. She has a history of diverticulosis and colon polyps and no evidence of peptic ulcer disease her endoscopic studies November 2013. No evidence of active blood loss. She was  transfused one unit of packed red bloods cell with improvement in her hemoglobin and symptomatology. Continue PPI.  Urinary tract infection. Clinically improved on ceftriaxone.  Insulin requiring diabetes mellitus. Her venous glucose is above 200. We'll make adjustments accordingly.  Hypertension. Currently controlled.  End-stage renal disease. Hemodialysis per Dr. Kristian Covey.    Plan: 1. Continue management as above with exception of increasing insulin a little for better control. 2. Physical therapy consultation. 3. Possible discharge tomorrow.  Time spent: 25 minutes.    [facility]  [facility] Pager [phone]. If 7PM-7AM, please contact night-coverage at [email or web address], password [facility] 01/11/2013, 11:37 AM  LOS: 1 day

## 2013-01-11 NOTE — Care Management Note (Signed)
    Page 1 of 1   01/11/2013     3:03:53 PM   CARE MANAGEMENT NOTE 01/11/2013  Patient:  [name],[name]   Account Number:  [id number]  Date Initiated:  01/11/2013  Documentation initiated by:  Rosemary Holms  Subjective/Objective Assessment:   Pt admitted from home where she lives with her husband. Pt states she is her because her hgb is low. No HH anticipated     Action/Plan:   Anticipated DC Date:     Anticipated DC Plan:  HOME/SELF CARE         Choice offered to / List presented to:             Status of service:  Completed, signed off Medicare Important Message given?   (If response is "NO", the following Medicare IM given date fields will be blank) Date Medicare IM given:   Date Additional Medicare IM given:    Discharge Disposition:    Per UR Regulation:    If discussed at Long Length of Stay Meetings, dates discussed:    Comments:  01/11/13 Rosemary Holms RN BSN CM

## 2013-01-12 DIAGNOSIS — I10 Essential (primary) hypertension: Secondary | ICD-10-CM

## 2013-01-12 LAB — BASIC METABOLIC PANEL
BUN: 29 mg/dL — ABNORMAL HIGH (ref 6–23)
CO2: 31 mEq/L (ref 19–32)
Calcium: 8.9 mg/dL (ref 8.4–10.5)
Chloride: 98 mEq/L (ref 96–112)
Creatinine, Ser: 4.79 mg/dL — ABNORMAL HIGH (ref 0.50–1.10)
GFR calc Af Amer: 10 mL/min — ABNORMAL LOW (ref >90)
GFR calc non Af Amer: 8 mL/min — ABNORMAL LOW (ref >90)

## 2013-01-12 LAB — GLUCOSE, CAPILLARY
Glucose-Capillary: 192 mg/dL — ABNORMAL HIGH (ref 70–99)
Glucose-Capillary: 213 mg/dL — ABNORMAL HIGH (ref 70–99)

## 2013-01-12 LAB — URINE CULTURE

## 2013-01-12 LAB — CBC
HCT: 25.9 % — ABNORMAL LOW (ref 36.0–46.0)
MCHC: 32.8 g/dL (ref 30.0–36.0)
Platelets: 145 10*3/uL — ABNORMAL LOW (ref 150–400)
RDW: 17.1 % — ABNORMAL HIGH (ref 11.5–15.5)
WBC: 4.8 10*3/uL (ref 4.0–10.5)

## 2013-01-12 LAB — HEPATITIS B SURFACE ANTIGEN: Hepatitis B Surface Ag: NEGATIVE

## 2013-01-12 MED ORDER — EPOETIN ALFA 10000 UNIT/ML IJ SOLN: 10000.0000 [IU] | INTRAMUSCULAR | Status: DC

## 2013-01-12 MED ORDER — FLUCONAZOLE 150 MG PO TABS: 150.0000 mg | ORAL_TABLET | Freq: Once | ORAL | Status: DC

## 2013-01-12 MED ORDER — CEFUROXIME AXETIL 500 MG PO TABS: 500.0000 mg | ORAL_TABLET | Freq: Two times a day (BID) | ORAL | Status: DC

## 2013-01-12 NOTE — Progress Notes (Signed)
Patient with orders to be discharged home. Discharge instructions given, patient verbalized understanding. Prescriptions given. Patient stable. Patient left with husband in private vehicle.

## 2013-01-12 NOTE — Progress Notes (Signed)
PT Screen Note  Patient Details Name: [name] MRN: [id number] DOB: 02/08/1946   Screen Treatment:    Reason Eval/Treat Not Completed: PT screened, no needs identified, will sign off   Ariauna Farabee, MPT, ATC 01/12/2013, 8:08 AM

## 2013-01-12 NOTE — Progress Notes (Signed)
Subjective: Interval History: has no complaint of dizziness or lightheadedness. Patient says that she's feeling much better..  Objective: Vital signs in last 24 hours: Temp:  [98.2 F (36.8 C)-98.6 F (37 C)] 98.2 F (36.8 C) (10/21 0500) Pulse Rate:  [69-76] 74 (10/21 0500) Resp:  [18-20] 18 (10/21 0500) BP: (141-171)/(62-80) 147/75 mmHg (10/21 0500) SpO2:  [90 %-98 %] 91 % (10/21 0500) Weight:  [85.6 kg (188 lb 11.4 oz)] 85.6 kg (188 lb 11.4 oz) (10/20 1415) Weight change: 4.406 kg (9 lb 11.4 oz)  Intake/Output from previous day: 10/20 0701 - 10/21 0700 In: 600 [P.O.:600] Out: 1832  Intake/Output this shift:    Generally she's alert and apparent distress Chest is clear to auscultation shouldn't have any rales rhonchi or egophony. Heart exam regular rate and rhythm no murmur Abdomen soft positive bowel sound Extremities no edema.  Lab Results:  Recent Labs  01/11/13 0150 01/12/13 0555  WBC 6.7 4.8  HGB 9.0* 8.5*  HCT 26.9* 25.9*  PLT 165 145*   BMET:  Recent Labs  01/11/13 0150 01/12/13 0555  NA 137 138  K 4.7 4.3  CL 100 98  CO2 25 31  GLUCOSE 202* 186*  BUN 51* 29*  CREATININE 7.09* 4.79*  CALCIUM 8.9 8.9   No results found for this basename: PTH,  in the last 72 hours Iron Studies:  Recent Labs  01/10/13 1437  IRON 45  TIBC 223*  FERRITIN 954*    Studies/Results: X-ray Chest Pa And Lateral   01/10/2013   CLINICAL DATA:  weakness.  EXAM: CHEST  2 VIEW  COMPARISON:  01/23/2012  FINDINGS: Cardiomegaly. No overt edema. Linear scarring in the left base. Hyperinflation of the lungs compatible with COPD. No effusions. No acute bony abnormality.  IMPRESSION: COPD/chronic changes. Cardiomegaly. No overt failure.   Electronically Signed   By: Charlett Nose M.D.   On: 01/10/2013 14:57    I have reviewed the patient's current medications.  Assessment/Plan: Problem #1 end-stage renal disease she status post hemodialysis yesterday. BUN and creatinine was  in acceptable range and normal potassium. Presently patient doesn't have any nausea or vomiting. Problem #2 anemia status post 1 unit of blood transfusion her hemoglobin and hematocrit slightly low but stable. Presently patient is a symptomatic. Possibly from GI bleeding. Patient has previous workup was negative result. Problem #3 hypertension her blood pressure seems reasonably controlled Problem #4 diabetes Problem #5 urine tract infection. Presently patient is a febrile and her white blood cell count is normal Problem #6 metabolic bone,. her calcium and phosphorus is with in  in acceptable range. Problem #7 coronary artery disease patient presently is a symptomatic.  Plan: We'll make arrangements for patient to get dialysis tomorrow if she's here.           I discussed with her about GI followup and she wants to go back to [facility] for followup.   LOS: 2 days   Eura Radabaugh S 01/12/2013,7:39 AM

## 2013-01-12 NOTE — Discharge Summary (Signed)
Physician Discharge Summary  [name] GEX:[id number] DOB: [birth year] DOA: 01/10/2013  PCP: [name], MD  Admit date: 01/10/2013 Discharge date: 01/12/2013  Time spent: Greater than 30 minutes  Recommendations for Outpatient Follow-up:  1. Recommend followup of the patient's hemoglobin and hematocrit. Consider elective outpatient followup evaluation with gastroenterology.  Discharge Diagnoses:  1. Escherichia coli urinary tract infection. 2. Acute on chronic anemia likely secondary to anemia of end-stage renal disease.. Status post 1 unit of packed red blood cells. Prior to the transfusion, the patient's total iron was 45, TIBC was 223, ferritin was 954, folate was greater than 20, and vitamin B12 was 445. 3. History of diverticulosis and colon polyps without evidence of peptic ulcer disease per endoscopic studies in November 2013. 4. End-stage renal disease. 5. Insulin requiring diabetes mellitus. 6. Hypertension. 7. Morbid obesity. 8. Generalized weakness secondary to #1 and #2. Resolved.     Discharge Condition: Improved.  Diet recommendation: Renal/carbohydrate modified.  Filed Weights   01/10/13 0914 01/10/13 1420 01/11/13 1415  Weight: 81.194 kg (179 lb) 83.3 kg (183 lb 10.3 oz) 85.6 kg (188 lb 11.4 oz)    History of present illness:  [name] is a 67 y.o. female who has end-stage renal disease on peritoneal dialysis, who presented to the emergency room with generalized weakness. Symptoms had been present for approximately one week now. She denied any fever, cough, vomiting or diarrhea. She felt that her generalized weakness and fatigue had been getting worse. She has not noticed any melena or hematochezia. She did occasionally feel short of breath during dialysis when fluid is being infused, but this usually resolves once she has completed dialysis. She presented to the emergency room for evaluation. Her urinalysis was indicative of infection. Hemoglobin was  also found to be low at 7.4. It was unclear what her baseline level was. She did have a level of 12.3 from 08/2012. Prior levels from 11/13 range between 7.8-8.8. Patient was admitted to the hospital for further treatment.    Hospital Course:   1. Generalized weakness. There is no evidence of focal unilateral weakness or dysarthria suggestive of an acute stroke. Her TSH was within normal limits. Her symptoms were likely the consequence of anemia in the urinary tract infection. Following treatment of both, her generalized weakness resolved.   Acute on chronic anemia. She denied black tarry stools or bright red blood per rectum. Her anemia panel did not reveal iron deficiency, but consistent with chronic disease. She was transfused one unit of packed red blood cells. Her hemoglobin improved to 9.0 and then stabilized at 8.5. She has a history of diverticulosis and colon polyps and no evidence of peptic ulcer disease her endoscopic studies November 2013. She was continued on Protonix. She'll need further surveillance in the outpatient setting. A followup GI evaluation may be warranted.   Urinary tract infection. She was started on ceftriaxone empirically. Her urine culture grew out greater than 100,000 colonies of Escherichia coli, sensitive to ceftriaxone. She received 2 days of IV therapy and was discharged on 5 more days of Ceftin.    Insulin requiring diabetes mellitus.  She was treated with sliding scale NovoLog and Lantus. Her hemoglobin A1c was 5.9.  Hypertension. Her blood pressure remained controlled.   End-stage renal disease. She underwent hemodialysis by Dr. Kristian Covey    Procedures:  Hemodialysis   Consultations:  Nephrologist, Dr. Kristian Covey  Discharge Exam: Filed Vitals:   01/12/13 0500  BP: 147/75  Pulse: 74  Temp: 98.2  F (36.8 C)  Resp: 18    General: Pleasant alert obese 67 year old Caucasian woman laying in bed, in no acute distress.  Cardiovascular: S1, S2,  with a soft systolic murmur.  Respiratory: Clear anteriorly with decreased breath sounds in the bases.  Abdomen: Obese, positive bowel sounds, soft, nontender, nondistended.  Extremities: No pedal edema.  Musculoskeletal: No acute hot red joints.  Neurologic: She is alert and oriented x3. Cranial nerves II through XII are intact. Strength globally 5 minus over 5 in the supine position.   Discharge Instructions  Discharge Orders   Future Orders Complete By Expires   Diet - low sodium heart healthy  As directed    Scheduling Instructions:     Renal restriction.   Diet Carb Modified  As directed    Increase activity slowly  As directed        Medication List         ALPRAZolam 1 MG tablet  Commonly known as:  XANAX  Take 1 mg by mouth 3 (three) times daily as needed for anxiety.     aspirin EC 81 MG tablet  Take 81 mg by mouth daily.     b complex-vitamin c-folic acid 0.8 MG Tabs tablet  Take 0.8 mg by mouth every morning.     carvedilol 12.5 MG tablet  Commonly known as:  COREG  Take 12.5 mg by mouth 2 (two) times daily with a meal.     cefUROXime 500 MG tablet  Commonly known as:  CEFTIN  Take 1 tablet (500 mg total) by mouth 2 (two) times daily. Starting tomorrow, antibiotic to take for 5 more days.     EPINEPHrine 0.3 mg/0.3 mL Soaj injection  Commonly known as:  EPIPEN  Inject 0.3 mLs (0.3 mg total) into the muscle once.     fluconazole 150 MG tablet  Commonly known as:  DIFLUCAN  Take 1 tablet (150 mg total) by mouth once.     HUMULIN 70/30 [place]  - Inject 15-20 Units into the skin 2 (two) times daily. 20 units in the morning    - 15 units at night     pantoprazole 40 MG tablet  Commonly known as:  PROTONIX  Take 1 tablet (40 mg total) by mouth daily. 30 minutes before breakfast.     simvastatin 40 MG tablet  Commonly known as:  ZOCOR  Take 40 mg by mouth at bedtime.       Allergies  Allergen Reactions  . Lisinopril Swelling       Follow-up  Information   Follow up with [name], MD On 01/18/2013. (at 12:30 pm)    Specialty:  Family Medicine   Contact information:   [address], [address] [place] [address] [phone]       Follow up with [facility], MD.   Specialty:  Nephrology   Contact information:   [phone] [address] [place] [address] [phone]        The results of significant diagnostics from this hospitalization (including imaging, microbiology, ancillary and laboratory) are listed below for reference.    Significant Diagnostic Studies: X-ray Chest Pa And Lateral   01/10/2013   CLINICAL DATA:  weakness.  EXAM: CHEST  2 VIEW  COMPARISON:  01/23/2012  FINDINGS: Cardiomegaly. No overt edema. Linear scarring in the left base. Hyperinflation of the lungs compatible with COPD. No effusions. No acute bony abnormality.  IMPRESSION: COPD/chronic changes. Cardiomegaly. No overt failure.   Electronically Signed  By: Charlett Nose M.D.   On: 01/10/2013 14:57   Mm Digital Screening  12/22/2012   CLINICAL DATA:  Screening.  EXAM: DIGITAL SCREENING BILATERAL MAMMOGRAM WITH CAD  COMPARISON:  Previous exam(s).  ACR Breast Density Category b: There are scattered areas of fibroglandular density.  FINDINGS: There are no findings suspicious for malignancy. Images were processed with CAD.  IMPRESSION: No mammographic evidence of malignancy. A result letter of this screening mammogram will be mailed directly to the patient.  RECOMMENDATION: Screening mammogram in one year. (Code:SM-B-01Y)  BI-RADS CATEGORY  1: Negative   Electronically Signed   By: Gordan Payment   On: 12/22/2012 08:45    Microbiology: Recent Results (from the past 240 hour(s))  URINE CULTURE     Status: None   Collection Time    01/10/13 11:02 AM      Result Value Range Status   Specimen Description URINE, CLEAN CATCH   Final   Special Requests NONE   Final   Culture  Setup Time     Final   Value: 01/10/2013 22:05      Performed at [facility] Count     Final   Value: >=100,000 COLONIES/ML     Performed at [facility]   Culture     Final   Value: ESCHERICHIA COLI     Performed at [facility]   Report Status 01/12/2013 FINAL   Final   Organism ID, Bacteria ESCHERICHIA COLI   Final     Labs: Basic Metabolic Panel:  Recent Labs Lab 01/10/13 0954 01/11/13 0150 01/12/13 0555  NA 141 137 138  K 4.2 4.7 4.3  CL 103 100 98  CO2 29 25 31   GLUCOSE 133* 202* 186*  BUN 51* 51* 29*  CREATININE 7.36* 7.09* 4.79*  CALCIUM 8.7 8.9 8.9  PHOS  --   --  4.2   Liver Function Tests: No results found for this basename: AST, ALT, ALKPHOS, BILITOT, PROT, ALBUMIN,  in the last 168 hours No results found for this basename: LIPASE, AMYLASE,  in the last 168 hours No results found for this basename: AMMONIA,  in the last 168 hours CBC:  Recent Labs Lab 01/10/13 0954 01/11/13 0150 01/12/13 0555  WBC 5.5 6.7 4.8  NEUTROABS 4.3  --   --   HGB 7.4* 9.0* 8.5*  HCT 22.8* 26.9* 25.9*  MCV 103.6* 98.9 101.2*  PLT 151 165 145*   Cardiac Enzymes:  Recent Labs Lab 01/10/13 1437 01/10/13 2102 01/11/13 0150  TROPONINI <0.30 <0.30 <0.30   BNP: BNP (last 3 results) No results found for this basename: PROBNP,  in the last 8760 hours CBG:  Recent Labs Lab 01/11/13 1109 01/11/13 1817 01/11/13 2045 01/12/13 0740 01/12/13 1139  GLUCAP 207* 97 197* 192* 213*       Signed:  Aryonna Gunnerson  [facility] 01/12/2013, 6:07 PM

## 2013-01-13 LAB — TYPE AND SCREEN
ABO/RH(D): B POS
Unit division: 0

## 2013-01-21 ENCOUNTER — Telehealth: Payer: Self-pay | Admitting: Gastroenterology

## 2013-01-28 NOTE — Telephone Encounter (Signed)
Dr Christella Hartigan do you want me to schedule her or do you want to see her in the office?

## 2013-01-28 NOTE — Telephone Encounter (Signed)
rov first, thanks

## 2013-01-28 NOTE — Telephone Encounter (Signed)
Left message on machine to call back  

## 2013-02-01 NOTE — Telephone Encounter (Signed)
Pt has been scheduled for an office visit

## 2013-02-01 NOTE — Telephone Encounter (Signed)
Left message on machine to call back  

## 2013-03-03 ENCOUNTER — Ambulatory Visit (INDEPENDENT_AMBULATORY_CARE_PROVIDER_SITE_OTHER): Payer: Medicare Other | Admitting: Gastroenterology

## 2013-03-03 ENCOUNTER — Encounter: Payer: Self-pay | Admitting: Gastroenterology

## 2013-03-03 VITALS — BP 132/60 | HR 80 | Ht 65.0 in | Wt 180.0 lb

## 2013-03-03 DIAGNOSIS — R933 Abnormal findings on diagnostic imaging of other parts of digestive tract: Secondary | ICD-10-CM

## 2013-03-03 NOTE — Patient Instructions (Signed)
You will be set up for an upper endoscopy at [facility] with duodenoscope evaluation of abnormal duodenum. (MAC sedation).

## 2013-03-03 NOTE — Progress Notes (Signed)
Review of pertinent gastrointestinal problems: 1. Abnormal duodenum mucosa noted by Dr. Jena Gauss EUS Christella Hartigan 03/2012: Non specific edema, thickening of periampullary duodenum. There was no discrete mass. Mucosal biopsies taken and sent to pathology.  Biopsies showed no sing of cancer, infection; Dr. Jena Gauss and I agreed for repeat duodenoscope evaluation of ampulla.   HPI: This is a   very pleasant 67 year old woman whom I last saw several months ago.  She was recommended to have repeat upper endoscopy with the side-viewing scope to reevaluate abnormal duodenal mucosa however in the interim her kidney failed and she has started peritoneal dialysis.  She has chronic anemia that may indeed be related to her kidney failure however there is question about whether there is a GI source. She underwent colonoscopy and upper endoscopy with Dr. Benard Rink about a year ago. Also my endoscopic ultrasound earlier this year.  Since last endoscopy, renal failure and she started Pdialysis this past may.    Past Medical History  Diagnosis Date  . Diabetes mellitus   . Stroke 2010  . Hypertension   . Hyperlipidemia   . Coronary artery disease   . Dialysis patient     T/Th/Sat [facility]-Dr Fausto Skillern, stops hemodialysis 11-28-2012, will do peritoneal dialysis  starting 12-01-2012   . Carpal tunnel syndrome   . Anemia   . Myocardial infarction 2009  . CKD (chronic kidney disease)   . Ovarian cancer 1995    De [name]  . Colon polyps 01/2012    Per colonoscopy, Dr. Jena Gauss    Past Surgical History  Procedure Laterality Date  . Back surgery  2009  . Complete abdominal hysterectomy  1995  . Abdominal hysterectomy    . Colonoscopy with esophagogastroduodenoscopy (egd)  01/27/2012    Procedure: COLONOSCOPY WITH ESOPHAGOGASTRODUODENOSCOPY (EGD);  Surgeon: Corbin Ade, MD;  Location: [facility];  Service: Endoscopy;  Laterality: N/A;  1:45  . Cardiac catheterization    . Eus  04/09/2012   Procedure: UPPER ENDOSCOPIC ULTRASOUND (EUS) LINEAR;  Surgeon: Rachael Fee, MD;  Location: [facility] ENDOSCOPY;  Service: Endoscopy;  Laterality: N/A;  . Tonsillectomy      child  . Peritoneal catheter insertion  10/30/2012    for peritoneal dialysis    Current Outpatient Prescriptions  Medication Sig Dispense Refill  . ALPRAZolam (XANAX) 1 MG tablet Take 1 mg by mouth 3 (three) times daily as needed for anxiety.      Marland Kitchen aspirin EC 81 MG tablet Take 81 mg by mouth daily.      Marland Kitchen b complex-vitamin c-folic acid (NEPHRO-VITE) 0.8 MG TABS tablet Take 0.8 mg by mouth every morning.      . carvedilol (COREG) 12.5 MG tablet Take 12.5 mg by mouth 2 (two) times daily with a meal.      . cefUROXime (CEFTIN) 500 MG tablet Take 1 tablet (500 mg total) by mouth 2 (two) times daily. Starting tomorrow, antibiotic to take for 5 more days.  10 tablet  0  . EPINEPHrine (EPIPEN) 0.3 mg/0.3 mL DEVI Inject 0.3 mLs (0.3 mg total) into the muscle once.  1 Device  1  . fluconazole (DIFLUCAN) 150 MG tablet Take 1 tablet (150 mg total) by mouth once.  1 tablet  0  . gentamicin cream (GARAMYCIN) 0.1 %       . Insulin Isophane & Regular (HUMULIN 70/30 [place]) Inject 15-20 Units into the skin 2 (two) times daily. 20 units in the morning   15 units at night      .  pantoprazole (PROTONIX) 40 MG tablet Take 1 tablet (40 mg total) by mouth daily. 30 minutes before breakfast.  30 tablet  11  . simvastatin (ZOCOR) 40 MG tablet Take 40 mg by mouth at bedtime.       No current facility-administered medications for this visit.    Allergies as of 03/03/2013 - Review Complete 03/03/2013  Allergen Reaction Noted  . Lisinopril Swelling 08/30/2012    Family History  Problem Relation Age of Onset  . Diabetes Mother     History   Social History  . Marital Status: Married    Spouse Name: N/A    Number of Children: 3  . Years of Education: N/A   Occupational History  . RETIRED    Social History Main Topics  . Smoking status:  Former Smoker -- 0.50 packs/day for 15 years  . Smokeless tobacco: Never Used  . Alcohol Use: No  . Drug Use: No  . Sexual Activity: No   Other Topics Concern  . Not on file   Social History Narrative   3 grown healthy children   Lives w/ husband   Has 8 grandkids      Physical Exam: BP 132/60  Pulse 80  Ht 5\' 5"  (1.651 m)  Wt 180 lb (81.647 kg)  BMI 29.95 kg/m2 Constitutional: generally well-appearing Psychiatric: alert and oriented x3 Abdomen: soft, nontender, nondistended, no obvious ascites, no peritoneal signs, normal bowel sounds     Assessment and plan: 67 y.o. female with abnormal duodenal mucosa, chronic anemia  As we had planned previously we'll proceed with side-viewing duodenum the scope evaluation of the abnormal duodenal mucosa, probably repeat biopsies.

## 2013-03-04 ENCOUNTER — Encounter (HOSPITAL_COMMUNITY): Payer: Self-pay | Admitting: Pharmacy Technician

## 2013-03-04 ENCOUNTER — Encounter (HOSPITAL_COMMUNITY): Payer: Self-pay | Admitting: *Deleted

## 2013-03-04 DIAGNOSIS — Z9289 Personal history of other medical treatment: Secondary | ICD-10-CM

## 2013-03-04 HISTORY — DX: Personal history of other medical treatment: Z92.89

## 2013-03-11 ENCOUNTER — Encounter (HOSPITAL_COMMUNITY): Payer: Medicare Other | Admitting: Anesthesiology

## 2013-03-11 ENCOUNTER — Encounter (HOSPITAL_COMMUNITY): Payer: Self-pay | Admitting: *Deleted

## 2013-03-11 ENCOUNTER — Ambulatory Visit (HOSPITAL_COMMUNITY)
Admission: RE | Admit: 2013-03-11 | Discharge: 2013-03-11 | Disposition: A | Discharge status: Home / Self Care | Payer: Medicare Other | Source: Ambulatory Visit | Attending: Gastroenterology | Admitting: Gastroenterology

## 2013-03-11 ENCOUNTER — Ambulatory Visit (HOSPITAL_COMMUNITY): Payer: Medicare Other | Admitting: Anesthesiology

## 2013-03-11 ENCOUNTER — Encounter (HOSPITAL_COMMUNITY)
Admission: RE | Disposition: A | Discharge status: Home / Self Care | Payer: Self-pay | Source: Ambulatory Visit | Attending: Gastroenterology

## 2013-03-11 DIAGNOSIS — R933 Abnormal findings on diagnostic imaging of other parts of digestive tract: Secondary | ICD-10-CM

## 2013-03-11 DIAGNOSIS — I5032 Chronic diastolic (congestive) heart failure: Secondary | ICD-10-CM | POA: Insufficient documentation

## 2013-03-11 DIAGNOSIS — Z87891 Personal history of nicotine dependence: Secondary | ICD-10-CM | POA: Insufficient documentation

## 2013-03-11 DIAGNOSIS — Z8543 Personal history of malignant neoplasm of ovary: Secondary | ICD-10-CM | POA: Insufficient documentation

## 2013-03-11 DIAGNOSIS — K2980 Duodenitis without bleeding: Secondary | ICD-10-CM | POA: Insufficient documentation

## 2013-03-11 DIAGNOSIS — Z8673 Personal history of transient ischemic attack (TIA), and cerebral infarction without residual deficits: Secondary | ICD-10-CM | POA: Insufficient documentation

## 2013-03-11 DIAGNOSIS — D649 Anemia, unspecified: Secondary | ICD-10-CM | POA: Insufficient documentation

## 2013-03-11 DIAGNOSIS — E119 Type 2 diabetes mellitus without complications: Secondary | ICD-10-CM | POA: Insufficient documentation

## 2013-03-11 DIAGNOSIS — Z794 Long term (current) use of insulin: Secondary | ICD-10-CM | POA: Insufficient documentation

## 2013-03-11 DIAGNOSIS — K5710 Diverticulosis of small intestine without perforation or abscess without bleeding: Secondary | ICD-10-CM | POA: Insufficient documentation

## 2013-03-11 DIAGNOSIS — I129 Hypertensive chronic kidney disease with stage 1 through stage 4 chronic kidney disease, or unspecified chronic kidney disease: Secondary | ICD-10-CM | POA: Insufficient documentation

## 2013-03-11 DIAGNOSIS — Z79899 Other long term (current) drug therapy: Secondary | ICD-10-CM | POA: Insufficient documentation

## 2013-03-11 DIAGNOSIS — I252 Old myocardial infarction: Secondary | ICD-10-CM | POA: Insufficient documentation

## 2013-03-11 DIAGNOSIS — E785 Hyperlipidemia, unspecified: Secondary | ICD-10-CM | POA: Insufficient documentation

## 2013-03-11 DIAGNOSIS — N189 Chronic kidney disease, unspecified: Secondary | ICD-10-CM | POA: Insufficient documentation

## 2013-03-11 DIAGNOSIS — Z7982 Long term (current) use of aspirin: Secondary | ICD-10-CM | POA: Insufficient documentation

## 2013-03-11 HISTORY — DX: Personal history of other medical treatment: Z92.89

## 2013-03-11 HISTORY — PX: ESOPHAGOGASTRODUODENOSCOPY (EGD) WITH PROPOFOL: SHX5813

## 2013-03-11 LAB — GLUCOSE, CAPILLARY
Glucose-Capillary: 261 mg/dL — ABNORMAL HIGH (ref 70–99)
Glucose-Capillary: 229 mg/dL — ABNORMAL HIGH (ref 70–99)

## 2013-03-11 SURGERY — ESOPHAGOGASTRODUODENOSCOPY (EGD) WITH PROPOFOL
Anesthesia: Monitor Anesthesia Care

## 2013-03-11 MED ORDER — FENTANYL CITRATE 0.05 MG/ML IJ SOLN
INTRAMUSCULAR | Status: AC
  Filled 2013-03-11: qty 2

## 2013-03-11 MED ORDER — PROPOFOL 10 MG/ML IV BOLUS
INTRAVENOUS | Status: AC
  Filled 2013-03-11: qty 20

## 2013-03-11 MED ORDER — FENTANYL CITRATE 0.05 MG/ML IJ SOLN
INTRAMUSCULAR | Status: DC | PRN
  Administered 2013-03-11: 50 ug via INTRAVENOUS

## 2013-03-11 MED ORDER — LACTATED RINGERS IV SOLN
INTRAVENOUS | Status: DC
  Administered 2013-03-11: 1000 mL via INTRAVENOUS

## 2013-03-11 MED ORDER — MIDAZOLAM HCL 5 MG/5ML IJ SOLN
INTRAMUSCULAR | Status: DC | PRN
  Administered 2013-03-11 (×2): 1 mg via INTRAVENOUS

## 2013-03-11 MED ORDER — PROPOFOL 10 MG/ML IV BOLUS
INTRAVENOUS | Status: DC | PRN
  Administered 2013-03-11: 20 mg via INTRAVENOUS
  Administered 2013-03-11: 40 mg via INTRAVENOUS

## 2013-03-11 MED ORDER — SODIUM CHLORIDE 0.9 % IV SOLN
INTRAVENOUS | Status: DC | PRN
  Administered 2013-03-11: 10:00:00 via INTRAVENOUS

## 2013-03-11 MED ORDER — BUTAMBEN-TETRACAINE-BENZOCAINE 2-2-14 % EX AERO
INHALATION_SPRAY | CUTANEOUS | Status: DC | PRN
  Administered 2013-03-11: 2 via TOPICAL

## 2013-03-11 MED ORDER — FENTANYL CITRATE 0.05 MG/ML IJ SOLN: 25.0000 ug | INTRAMUSCULAR | Status: DC | PRN

## 2013-03-11 MED ORDER — LACTATED RINGERS IV SOLN: INTRAVENOUS | Status: DC

## 2013-03-11 MED ORDER — SODIUM CHLORIDE 0.9 % IV SOLN
INTRAVENOUS | Status: DC
  Administered 2013-03-11: 1000 mL via INTRAVENOUS

## 2013-03-11 MED ORDER — INSULIN ASPART 100 UNIT/ML ~~LOC~~ SOLN
0.0000 [IU] | SUBCUTANEOUS | Status: DC
  Administered 2013-03-11: 8 [IU] via SUBCUTANEOUS
  Filled 2013-03-11: qty 0.15

## 2013-03-11 MED ORDER — MIDAZOLAM HCL 2 MG/2ML IJ SOLN
INTRAMUSCULAR | Status: AC
  Filled 2013-03-11: qty 2

## 2013-03-11 SURGICAL SUPPLY — 14 items

## 2013-03-11 NOTE — Transfer of Care (Signed)
Immediate Anesthesia Transfer of Care Note  Patient: [name]  Procedure(s) Performed: Procedure(s): ESOPHAGOGASTRODUODENOSCOPY (EGD) WITH PROPOFOL (N/A)  Patient Location: PACU and Endoscopy Unit  Anesthesia Type:MAC  Level of Consciousness: awake, oriented, lethargic and responds to stimulation  Airway & Oxygen Therapy: Patient Spontanous Breathing and Patient connected to face mask oxygen  Post-op Assessment: Report given to PACU RN, Post -op Vital signs reviewed and stable and Patient moving all extremities  Post vital signs: Reviewed and stable  Complications: No apparent anesthesia complications

## 2013-03-11 NOTE — Preoperative (Signed)
Beta Blockers   Reason not to administer Beta Blockers:Not Applicable 

## 2013-03-11 NOTE — Interval H&P Note (Signed)
History and Physical Interval Note:  03/11/2013 9:03 AM  [name]  has presented today for surgery, with the diagnosis of Mucosal abnormality of duodenum [537.9]  The various methods of treatment have been discussed with the patient and family. After consideration of risks, benefits and other options for treatment, the patient has consented to  Procedure(s): ESOPHAGOGASTRODUODENOSCOPY (EGD) WITH PROPOFOL (N/A) as a surgical intervention .  The patient's history has been reviewed, patient examined, no change in status, stable for surgery.  I have reviewed the patient's chart and labs.  Questions were answered to the patient's satisfaction.     Rachael Fee

## 2013-03-11 NOTE — Op Note (Signed)
[facility] [address] [place], [address]   ENDOSCOPY PROCEDURE REPORT  PATIENT: [name], [name]  MR#: [id number] BIRTHDATE: 11/09/1945 , 67  yrs. old GENDER: Female ENDOSCOPIST: Rachael Fee, MD PROCEDURE DATE:  03/11/2013 PROCEDURE:  EGD w/ biopsy ASA CLASS:     Class III INDICATIONS:  Abnormal duodenum mucosa noted by Dr.  Jena Gauss during EGD 2013:  EUS Christella Hartigan 03/2012: Non specific edema, thickening of periampullary duodenum.  There was no discrete mass.  Mucosal biopsies taken and sent to pathology.  Biopsies showed no sing of cancer, infection; Dr.  Jena Gauss and I agreed for repeat duodenoscope evaluation of ampulla. Was to be at 3-4 month interval however she suffered renal failure and eventually started PD; MEDICATIONS: MAC sedation, administered by CRNA TOPICAL ANESTHETIC: Cetacaine Spray  DESCRIPTION OF PROCEDURE: After the risks benefits and alternatives of the procedure were thoroughly explained, informed consent was obtained.  The Pentax Gastroscope [id number] side viewing duodenoscope was introduced through the mouth and advanced to the second portion of the duodenum. Without limitations.  The instrument was slowly withdrawn as the mucosa was fully examined.   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  The duodenal mucosa was still edematous appearing and a bit friable on biopsy.  This abnormal mucosa was focal, approximately 2-3cm across, located very close to the major papilla but did not touch the papilla. Appearted to be more posterior than the papilla.  I took several biopsies with duodenoscope.  The examination was otherwise normal. Retroflexed views revealed no abnormalities.     The scope was then withdrawn from the patient and the procedure completed. COMPLICATIONS: There were no complications.  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  The duodenal mucosa was still edematous appearing and a bit friable on  biopsy.  This abnormal mucosa was focal, not frankely neoplastic appearing, approximately 2-3cm across, located very close to the major papilla but did not touch the papilla.  Appearted to be more posterior than the papilla.  I took several biopsies with duodenoscope.  The examination was otherwise normal.  RECOMMENDATIONS: Await repeat biopsy results.   eSigned:  Rachael Fee, MD 03/11/2013 10:14 AM   CC: Augusto Gamble, MD

## 2013-03-11 NOTE — H&P (View-Only) (Signed)
Review of pertinent gastrointestinal problems: 1. Abnormal duodenum mucosa noted by Dr. Rourk EUS Jacobs 03/2012: Non specific edema, thickening of periampullary duodenum. There was no discrete mass. Mucosal biopsies taken and sent to pathology.  Biopsies showed no sing of cancer, infection; Dr. Rourk and I agreed for repeat duodenoscope evaluation of ampulla.   HPI: This is a   very pleasant 67-year-old woman whom I last saw several months ago.  She was recommended to have repeat upper endoscopy with the side-viewing scope to reevaluate abnormal duodenal mucosa however in the interim her kidney failed and she has started peritoneal dialysis.  She has chronic anemia that may indeed be related to her kidney failure however there is question about whether there is a GI source. She underwent colonoscopy and upper endoscopy with Dr. Roark about a year ago. Also my endoscopic ultrasound earlier this year.  Since last endoscopy, renal failure and she started Pdialysis this past may.    Past Medical History  Diagnosis Date  . Diabetes mellitus   . Stroke 2010  . Hypertension   . Hyperlipidemia   . Coronary artery disease   . Dialysis patient     T/Th/Sat [facility], stops hemodialysis 11-28-2012, will do peritoneal dialysis  starting 12-01-2012   . Carpal tunnel syndrome   . Anemia   . Myocardial infarction 2009  . CKD (chronic kidney disease)   . Ovarian cancer 1995    [name]  . Colon polyps 01/2012    Per colonoscopy, Dr. Rourk    Past Surgical History  Procedure Laterality Date  . Back surgery  2009  . Complete abdominal hysterectomy  1995  . Abdominal hysterectomy    . Colonoscopy with esophagogastroduodenoscopy (egd)  01/27/2012    Procedure: COLONOSCOPY WITH ESOPHAGOGASTRODUODENOSCOPY (EGD);  Surgeon: Robert M Rourk, MD;  Location: [facility];  Service: Endoscopy;  Laterality: N/A;  1:45  . Cardiac catheterization    . Eus  04/09/2012   Procedure: UPPER ENDOSCOPIC ULTRASOUND (EUS) LINEAR;  Surgeon: Daniel P Jacobs, MD;  Location: [facility];  Service: Endoscopy;  Laterality: N/A;  . Tonsillectomy      child  . Peritoneal catheter insertion  10/30/2012    for peritoneal dialysis    Current Outpatient Prescriptions  Medication Sig Dispense Refill  . ALPRAZolam (XANAX) 1 MG tablet Take 1 mg by mouth 3 (three) times daily as needed for anxiety.      . aspirin EC 81 MG tablet Take 81 mg by mouth daily.      . b complex-vitamin c-folic acid (NEPHRO-VITE) 0.8 MG TABS tablet Take 0.8 mg by mouth every morning.      . carvedilol (COREG) 12.5 MG tablet Take 12.5 mg by mouth 2 (two) times daily with a meal.      . cefUROXime (CEFTIN) 500 MG tablet Take 1 tablet (500 mg total) by mouth 2 (two) times daily. Starting tomorrow, antibiotic to take for 5 more days.  10 tablet  0  . EPINEPHrine (EPIPEN) 0.3 mg/0.3 mL DEVI Inject 0.3 mLs (0.3 mg total) into the muscle once.  1 Device  1  . fluconazole (DIFLUCAN) 150 MG tablet Take 1 tablet (150 mg total) by mouth once.  1 tablet  0  . gentamicin cream (GARAMYCIN) 0.1 %       . Insulin Isophane & Regular (HUMULIN 70/30 [place]olia) Inject 15-20 Units into the skin 2 (two) times daily. 20 units in the morning   15 units at night      .   pantoprazole (PROTONIX) 40 MG tablet Take 1 tablet (40 mg total) by mouth daily. 30 minutes before breakfast.  30 tablet  11  . simvastatin (ZOCOR) 40 MG tablet Take 40 mg by mouth at bedtime.       No current facility-administered medications for this visit.    Allergies as of 03/03/2013 - Review Complete 03/03/2013  Allergen Reaction Noted  . Lisinopril Swelling 08/30/2012    Family History  Problem Relation Age of Onset  . Diabetes Mother     History   Social History  . Marital Status: Married    Spouse Name: N/A    Number of Children: 3  . Years of Education: N/A   Occupational History  . RETIRED    Social History Main Topics  . Smoking status:  Former Smoker -- 0.50 packs/day for 15 years  . Smokeless tobacco: Never Used  . Alcohol Use: No  . Drug Use: No  . Sexual Activity: No   Other Topics Concern  . Not on file   Social History Narrative   3 grown healthy children   Lives w/ husband   Has 8 grandkids      Physical Exam: BP 132/60  Pulse 80  Ht 5' 5" (1.651 m)  Wt 180 lb (81.647 kg)  BMI 29.95 kg/m2 Constitutional: generally well-appearing Psychiatric: alert and oriented x3 Abdomen: soft, nontender, nondistended, no obvious ascites, no peritoneal signs, normal bowel sounds     Assessment and plan: 67 y.o. female with abnormal duodenal mucosa, chronic anemia  As we had planned previously we'll proceed with side-viewing duodenum the scope evaluation of the abnormal duodenal mucosa, probably repeat biopsies.  

## 2013-03-11 NOTE — Anesthesia Postprocedure Evaluation (Signed)
  Anesthesia Post-op Note  Patient: [name]  Procedure(s) Performed: Procedure(s) (LRB): ESOPHAGOGASTRODUODENOSCOPY (EGD) WITH PROPOFOL (N/A)  Patient Location: PACU  Anesthesia Type: MAC  Level of Consciousness: awake and alert   Airway and Oxygen Therapy: Patient Spontanous Breathing  Post-op Pain: mild  Post-op Assessment: Post-op Vital signs reviewed, Patient's Cardiovascular Status Stable, Respiratory Function Stable, Patent Airway and No signs of Nausea or vomiting  Last Vitals:  Filed Vitals:   03/11/13 1051  BP: 152/62  Pulse: 72  Temp:   Resp: 16    Post-op Vital Signs: stable   Complications: No apparent anesthesia complications

## 2013-03-11 NOTE — Anesthesia Preprocedure Evaluation (Addendum)
Anesthesia Evaluation  Patient identified by MRN, date of birth, ID band Patient awake    Reviewed: Allergy & Precautions, H&P , NPO status , Patient's Chart, lab work & pertinent test results, reviewed documented beta blocker date and time   Airway Mallampati: II TM Distance: >3 FB Neck ROM: full    Dental  (+) Edentulous Upper, Edentulous Lower and Dental Advisory Given   Pulmonary neg pulmonary ROS, former smoker,  breath sounds clear to auscultation  Pulmonary exam normal       Cardiovascular hypertension, Pt. on home beta blockers + CAD and + Past MI Rhythm:regular Rate:Normal  MI 2009. Chronic diastolic heart failure   Neuro/Psych CVA, No Residual Symptoms negative psych ROS   GI/Hepatic negative GI ROS, Neg liver ROS,   Endo/Other  diabetes, Well Controlled, Type 2, Insulin Dependent  Renal/GU CRF and DialysisRenal disease  negative genitourinary   Musculoskeletal   Abdominal   Peds  Hematology negative hematology ROS (+)   Anesthesia Other Findings   Reproductive/Obstetrics negative OB ROS                          Anesthesia Physical Anesthesia Plan  ASA: III  Anesthesia Plan: MAC   Post-op Pain Management:    Induction:   Airway Management Planned: Simple Face Mask  Additional Equipment:   Intra-op Plan:   Post-operative Plan:   Informed Consent: I have reviewed the patients History and Physical, chart, labs and discussed the procedure including the risks, benefits and alternatives for the proposed anesthesia with the patient or authorized representative who has indicated his/her understanding and acceptance.   Dental Advisory Given  Plan Discussed with: CRNA and Surgeon  Anesthesia Plan Comments:         Anesthesia Quick Evaluation

## 2013-03-12 ENCOUNTER — Encounter (HOSPITAL_COMMUNITY): Payer: Self-pay | Admitting: Gastroenterology

## 2013-04-30 ENCOUNTER — Other Ambulatory Visit: Payer: Self-pay | Admitting: Family Medicine

## 2013-04-30 DIAGNOSIS — R109 Unspecified abdominal pain: Secondary | ICD-10-CM

## 2013-05-03 ENCOUNTER — Other Ambulatory Visit (HOSPITAL_COMMUNITY): Payer: Self-pay | Admitting: Family Medicine

## 2013-05-03 DIAGNOSIS — R109 Unspecified abdominal pain: Secondary | ICD-10-CM

## 2013-05-05 ENCOUNTER — Other Ambulatory Visit: Payer: Medicare Other

## 2013-05-06 ENCOUNTER — Ambulatory Visit (HOSPITAL_COMMUNITY)
Admission: RE | Admit: 2013-05-06 | Discharge: 2013-05-06 | Disposition: A | Discharge status: Home / Self Care | Payer: Medicare Other | Source: Ambulatory Visit | Attending: Family Medicine | Admitting: Family Medicine

## 2013-05-06 DIAGNOSIS — K7689 Other specified diseases of liver: Secondary | ICD-10-CM | POA: Insufficient documentation

## 2013-05-06 DIAGNOSIS — E785 Hyperlipidemia, unspecified: Secondary | ICD-10-CM | POA: Insufficient documentation

## 2013-05-06 DIAGNOSIS — R109 Unspecified abdominal pain: Secondary | ICD-10-CM | POA: Insufficient documentation

## 2013-05-06 DIAGNOSIS — Z905 Acquired absence of kidney: Secondary | ICD-10-CM | POA: Insufficient documentation

## 2013-05-06 DIAGNOSIS — E119 Type 2 diabetes mellitus without complications: Secondary | ICD-10-CM | POA: Insufficient documentation

## 2013-05-06 DIAGNOSIS — N186 End stage renal disease: Secondary | ICD-10-CM | POA: Insufficient documentation

## 2013-05-06 DIAGNOSIS — I120 Hypertensive chronic kidney disease with stage 5 chronic kidney disease or end stage renal disease: Secondary | ICD-10-CM | POA: Insufficient documentation

## 2013-06-17 HISTORY — PX: OTHER SURGICAL HISTORY: SHX169

## 2013-06-23 DIAGNOSIS — K659 Peritonitis, unspecified: Secondary | ICD-10-CM

## 2013-06-23 HISTORY — DX: Peritonitis, unspecified: K65.9

## 2013-06-25 ENCOUNTER — Inpatient Hospital Stay
Admission: RE | Admit: 2013-06-25 | Discharge: 2013-07-01 | Disposition: A | Discharge status: Nursing Home (Includes ICF) | Payer: Medicare Other | Source: Ambulatory Visit | Attending: Internal Medicine | Admitting: Internal Medicine

## 2013-06-27 ENCOUNTER — Ambulatory Visit (HOSPITAL_COMMUNITY)
Admit: 2013-06-27 | Discharge: 2013-06-27 | Disposition: A | Discharge status: Home / Self Care | Payer: Medicare Other | Source: Skilled Nursing Facility | Attending: Internal Medicine | Admitting: Internal Medicine

## 2013-06-27 DIAGNOSIS — J9819 Other pulmonary collapse: Secondary | ICD-10-CM | POA: Insufficient documentation

## 2013-06-27 DIAGNOSIS — J90 Pleural effusion, not elsewhere classified: Secondary | ICD-10-CM | POA: Insufficient documentation

## 2013-06-27 DIAGNOSIS — R0602 Shortness of breath: Secondary | ICD-10-CM | POA: Insufficient documentation

## 2013-06-28 ENCOUNTER — Other Ambulatory Visit: Payer: Self-pay | Admitting: *Deleted

## 2013-06-28 LAB — GLUCOSE, CAPILLARY
GLUCOSE-CAPILLARY: 221 mg/dL — AB (ref 70–99)
GLUCOSE-CAPILLARY: 155 mg/dL — AB (ref 70–99)
Glucose-Capillary: 96 mg/dL (ref 70–99)
Glucose-Capillary: 129 mg/dL — ABNORMAL HIGH (ref 70–99)
Glucose-Capillary: 144 mg/dL — ABNORMAL HIGH (ref 70–99)
Glucose-Capillary: 86 mg/dL (ref 70–99)
Glucose-Capillary: 170 mg/dL — ABNORMAL HIGH (ref 70–99)
Glucose-Capillary: 191 mg/dL — ABNORMAL HIGH (ref 70–99)

## 2013-06-28 MED ORDER — ALPRAZOLAM 0.5 MG PO TABS: ORAL_TABLET | ORAL | Status: DC

## 2013-06-28 NOTE — Telephone Encounter (Signed)
[facility] 

## 2013-06-29 ENCOUNTER — Non-Acute Institutional Stay (SKILLED_NURSING_FACILITY): Payer: Medicare Other | Admitting: Internal Medicine

## 2013-06-29 DIAGNOSIS — R269 Unspecified abnormalities of gait and mobility: Secondary | ICD-10-CM

## 2013-06-29 DIAGNOSIS — K650 Generalized (acute) peritonitis: Secondary | ICD-10-CM

## 2013-06-29 DIAGNOSIS — I2510 Atherosclerotic heart disease of native coronary artery without angina pectoris: Secondary | ICD-10-CM

## 2013-06-29 DIAGNOSIS — E1165 Type 2 diabetes mellitus with hyperglycemia: Principal | ICD-10-CM

## 2013-06-29 DIAGNOSIS — E1129 Type 2 diabetes mellitus with other diabetic kidney complication: Secondary | ICD-10-CM

## 2013-06-29 LAB — GLUCOSE, CAPILLARY
Glucose-Capillary: 213 mg/dL — ABNORMAL HIGH (ref 70–99)
Glucose-Capillary: 190 mg/dL — ABNORMAL HIGH (ref 70–99)
Glucose-Capillary: 140 mg/dL — ABNORMAL HIGH (ref 70–99)
Glucose-Capillary: 100 mg/dL — ABNORMAL HIGH (ref 70–99)
Glucose-Capillary: 310 mg/dL — ABNORMAL HIGH (ref 70–99)

## 2013-06-29 NOTE — Progress Notes (Addendum)
Patient ID: [name], female   DOB: [birth year], 68 y.o.   MRN: [id number]                   HISTORY & PHYSICAL  DATE:  06/28/2013    FACILITY: [facility]    LEVEL OF CARE:   SNF   CHIEF COMPLAINT:  Admission to SNF, post stay at [facility], exact dates uncertain.    HISTORY OF PRESENT ILLNESS:  This is a patient who I believe was admitted electively for surgical repair of severe mitral regurgitation.    She is a patient who has been on hemodialysis for many years (7) secondary to diabetes.  She also has a history of being on peritoneal dialysis for the last seven months.    Finally, she has moderate pulmonary hypertension, mildly decreased left ventricular function.  Cardiac catheterization showed multi-vessel coronary artery disease, including total occlusion of the LAD.    The patient underwent a surgery on 06/17/2013.  This included three-vessel CABG, obtuse marginal saphenous vein to the first diagonal, left internal mammary artery to the left anterior descending, mitral valve annuloplasty, MAZE procedure.    Postoperatively, she had several problems including failed hemodialysis due to bad flow to her AV fistula.  Peritoneal dialysis was initiated.  She developed peritonitis on 06/23/2013 with cultures negative.  However, she had elevated leukocyte count in her peritoneal fluid.  She was discharged on vanc and cefepime at hemodialysis for two weeks.  Last date would be 07/04/2013.    She was felt to be deconditioned from her surgery and is here for rehabilitation.    Since her arrival here, she has been complaining about abdominal pain, some anorexia, and low blood pressures.      PAST MEDICAL HISTORY/PROBLEM LIST:  Includes:    Diabetes.  On insulin.    ?Atrial fibrillation.  On amiodarone and aspirin.    Coronary artery disease with a history of severe mitral regurgitation.  See discussion above.     Chronic renal failure related to diabetes.  On  dialysis for seven years.    CURRENT MEDICATIONS:  Medication list is reviewed.    Amiodarone 200 mg daily.    ASA 325 daily.    Nephro-Vite 1 tablet daily.    Plavix 75 q.d.    Aranesp at dialysis.    ?Carvedilol 3.125, half a tablet by mouth two times daily (1.5625 b.i.d.).    SOCIAL HISTORY:   HOUSING:  The patient lives in [place] with her husband.   FUNCTIONAL STATUS:  Independent with all facets prior to her surgery.  Not using ambulatory assist devices.  Gives her own insulin.  Not on oxygen.    REVIEW OF SYSTEMS:   CHEST/RESPIRATORY:  She is not complaining of shortness of breath, although she is on oxygen.   CARDIAC:   No exertional chest pain.   No pleuritic chest pain.    GI:  She complains of lower abdominal pain which is fairly constant.  She is not clearly having diarrhea.  States she had soft BMs over the weekend.      PHYSICAL EXAMINATION:   VITAL SIGNS:   BLOOD PRESSURE:  128/42.   PULSE:  68, although earlier it was reported by the nurses at 62.   O2 SATURATIONS:  91% on room air, although the patient may have been on oxygen.   GENERAL APPEARANCE:  The patient does not look to be in any distress.   HEENT:  MOUTH/THROAT:  Lips without lesions.  No lesions noted in mouth.  Tongue is without lesions.  Oropharynx without redness or lesions.  Uvula elevates midline.  Teeth are in good repair.   CHEST/RESPIRATORY:  Decreased air entry in the left lower lobe, consistent with a pleural effusion.   CARDIOVASCULAR:  CARDIAC:  Her surgical incision is well healed.  There are no murmurs.  No gallops.  No rubs.  Her JVP is elevated at 45 degrees.   GASTROINTESTINAL:  ABDOMEN:   Bruising from previous Lovenox injections.  She has a peritoneal dialysis catheter.  Other than this, her exam is totally benign.  No masses.  No tenderness to deep palpation.   LIVER/SPLEEN/KIDNEYS:  No liver, no spleen.  No tenderness.   CIRCULATION:  EDEMA/VARICOSITIES:  Extremities:  Her  saphenous vein harvest site is slightly erythematous.  I marked this.  This certainly does not look ominous, either.  She has no edema.   NEUROLOGICAL:    SENSATION/STRENGTH:  Antigravity strength in her legs.   DEEP TENDON REFLEXES:  Reflexes preserved at the knee jerks.    ASSESSMENT/PLAN:  Status post complicated cardiac surgery including a CABG x3, mitral valve annuloplasty, and MAZE procedure.  She seems to be doing remarkably well at this stage.    Hypotension.  Reported by the nurses at 128/42 this morning, pulse 52 although when I got into the room it was 68.  I have gone ahead and given her her Coreg, which is a very small dose.    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  On dialysis.  She is on insulin NPH 70/30, 20 U in the a.m., 15 U in the p.m.     Peritonitis.  Secondary to peritoneal dialysis.  She is on cefepime and vancomycin at dialysis.  We verified this today.  They are monitoring the levels.    Anxiety.  On Xanax 0.5 q.h.s.    Hyperlipidemia.  On Zocor 40 mg daily.    Abdominal pain.  Currently, I am really not able to get a clear sense of this.  Her exam is completely benign.  Lab work will be necessary, including a CBC and diff.    The patient tells me that she was on Protonix at home.  Clinically, it sounds as though she might have gastroesophageal reflux with a hiatal hernia as bowel sounds are heard up into her chest.  She is on both Plavix and aspirin.  I am going to go ahead and start her back on Protonix her clinically.  I am somewhat concerned about pseudomembranous colitis in a situation like this and I would like to be vigilant about this.  I have spoken to the staff and the patient.

## 2013-06-30 LAB — GLUCOSE, CAPILLARY
GLUCOSE-CAPILLARY: 197 mg/dL — AB (ref 70–99)
Glucose-Capillary: 215 mg/dL — ABNORMAL HIGH (ref 70–99)
Glucose-Capillary: 161 mg/dL — ABNORMAL HIGH (ref 70–99)

## 2013-07-01 ENCOUNTER — Encounter (HOSPITAL_COMMUNITY): Payer: Self-pay | Admitting: Emergency Medicine

## 2013-07-01 ENCOUNTER — Inpatient Hospital Stay (HOSPITAL_COMMUNITY)
Admission: EM | Admit: 2013-07-01 | Discharge: 2013-07-05 | DRG: 377 | Disposition: A | Discharge status: Skilled Nursing Facility | Payer: Medicare Other | Attending: Internal Medicine | Admitting: Internal Medicine

## 2013-07-01 DIAGNOSIS — N058 Unspecified nephritic syndrome with other morphologic changes: Secondary | ICD-10-CM | POA: Diagnosis present

## 2013-07-01 DIAGNOSIS — K449 Diaphragmatic hernia without obstruction or gangrene: Secondary | ICD-10-CM | POA: Diagnosis present

## 2013-07-01 DIAGNOSIS — Z6826 Body mass index (BMI) 26.0-26.9, adult: Secondary | ICD-10-CM

## 2013-07-01 DIAGNOSIS — Z7982 Long term (current) use of aspirin: Secondary | ICD-10-CM

## 2013-07-01 DIAGNOSIS — I120 Hypertensive chronic kidney disease with stage 5 chronic kidney disease or end stage renal disease: Secondary | ICD-10-CM | POA: Diagnosis present

## 2013-07-01 DIAGNOSIS — I5032 Chronic diastolic (congestive) heart failure: Secondary | ICD-10-CM | POA: Diagnosis present

## 2013-07-01 DIAGNOSIS — K922 Gastrointestinal hemorrhage, unspecified: Secondary | ICD-10-CM | POA: Diagnosis present

## 2013-07-01 DIAGNOSIS — K625 Hemorrhage of anus and rectum: Secondary | ICD-10-CM

## 2013-07-01 DIAGNOSIS — E785 Hyperlipidemia, unspecified: Secondary | ICD-10-CM | POA: Diagnosis present

## 2013-07-01 DIAGNOSIS — Z87891 Personal history of nicotine dependence: Secondary | ICD-10-CM

## 2013-07-01 DIAGNOSIS — N186 End stage renal disease: Secondary | ICD-10-CM | POA: Diagnosis present

## 2013-07-01 DIAGNOSIS — K264 Chronic or unspecified duodenal ulcer with hemorrhage: Principal | ICD-10-CM | POA: Diagnosis present

## 2013-07-01 DIAGNOSIS — D649 Anemia, unspecified: Secondary | ICD-10-CM

## 2013-07-01 DIAGNOSIS — Z7902 Long term (current) use of antithrombotics/antiplatelets: Secondary | ICD-10-CM

## 2013-07-01 DIAGNOSIS — E119 Type 2 diabetes mellitus without complications: Secondary | ICD-10-CM

## 2013-07-01 DIAGNOSIS — E1129 Type 2 diabetes mellitus with other diabetic kidney complication: Secondary | ICD-10-CM

## 2013-07-01 DIAGNOSIS — D62 Acute posthemorrhagic anemia: Secondary | ICD-10-CM | POA: Diagnosis present

## 2013-07-01 DIAGNOSIS — I10 Essential (primary) hypertension: Secondary | ICD-10-CM

## 2013-07-01 DIAGNOSIS — Z951 Presence of aortocoronary bypass graft: Secondary | ICD-10-CM

## 2013-07-01 DIAGNOSIS — D692 Other nonthrombocytopenic purpura: Secondary | ICD-10-CM | POA: Diagnosis present

## 2013-07-01 DIAGNOSIS — K274 Chronic or unspecified peptic ulcer, site unspecified, with hemorrhage: Secondary | ICD-10-CM

## 2013-07-01 DIAGNOSIS — D539 Nutritional anemia, unspecified: Secondary | ICD-10-CM | POA: Diagnosis present

## 2013-07-01 DIAGNOSIS — I252 Old myocardial infarction: Secondary | ICD-10-CM

## 2013-07-01 DIAGNOSIS — Z8673 Personal history of transient ischemic attack (TIA), and cerebral infarction without residual deficits: Secondary | ICD-10-CM

## 2013-07-01 DIAGNOSIS — I509 Heart failure, unspecified: Secondary | ICD-10-CM | POA: Diagnosis present

## 2013-07-01 DIAGNOSIS — Z794 Long term (current) use of insulin: Secondary | ICD-10-CM

## 2013-07-01 DIAGNOSIS — Z833 Family history of diabetes mellitus: Secondary | ICD-10-CM

## 2013-07-01 DIAGNOSIS — Z992 Dependence on renal dialysis: Secondary | ICD-10-CM

## 2013-07-01 DIAGNOSIS — I2510 Atherosclerotic heart disease of native coronary artery without angina pectoris: Secondary | ICD-10-CM | POA: Diagnosis present

## 2013-07-01 DIAGNOSIS — E1165 Type 2 diabetes mellitus with hyperglycemia: Secondary | ICD-10-CM | POA: Diagnosis present

## 2013-07-01 DIAGNOSIS — Z8543 Personal history of malignant neoplasm of ovary: Secondary | ICD-10-CM

## 2013-07-01 DIAGNOSIS — Z954 Presence of other heart-valve replacement: Secondary | ICD-10-CM

## 2013-07-01 HISTORY — DX: Nonrheumatic mitral (valve) insufficiency: I34.0

## 2013-07-01 HISTORY — DX: Peritonitis, unspecified: K65.9

## 2013-07-01 LAB — CBC
HCT: 19.1 % — ABNORMAL LOW (ref 36.0–46.0)
Hemoglobin: 6.2 g/dL — CL (ref 12.0–15.0)
MCH: 31.5 pg (ref 26.0–34.0)
MCHC: 32.5 g/dL (ref 30.0–36.0)
MCV: 97 fL (ref 78.0–100.0)
PLATELETS: 241 10*3/uL (ref 150–400)
RBC: 1.97 MIL/uL — AB (ref 3.87–5.11)
RDW: 20.9 % — ABNORMAL HIGH (ref 11.5–15.5)
WBC: 4.5 10*3/uL (ref 4.0–10.5)

## 2013-07-01 LAB — GLUCOSE, CAPILLARY
GLUCOSE-CAPILLARY: 147 mg/dL — AB (ref 70–99)
Glucose-Capillary: 128 mg/dL — ABNORMAL HIGH (ref 70–99)
Glucose-Capillary: 115 mg/dL — ABNORMAL HIGH (ref 70–99)

## 2013-07-01 LAB — COMPREHENSIVE METABOLIC PANEL
ALBUMIN: 1.9 g/dL — AB (ref 3.5–5.2)
ALK PHOS: 78 U/L (ref 39–117)
ALT: 18 U/L (ref 0–35)
AST: 18 U/L (ref 0–37)
BUN: 56 mg/dL — AB (ref 6–23)
CHLORIDE: 97 meq/L (ref 96–112)
CO2: 27 mEq/L (ref 19–32)
Calcium: 8.1 mg/dL — ABNORMAL LOW (ref 8.4–10.5)
Creatinine, Ser: 6.02 mg/dL — ABNORMAL HIGH (ref 0.50–1.10)
GFR calc Af Amer: 7 mL/min — ABNORMAL LOW (ref >90)
GFR calc non Af Amer: 6 mL/min — ABNORMAL LOW (ref >90)
Glucose, Bld: 154 mg/dL — ABNORMAL HIGH (ref 70–99)
POTASSIUM: 5.3 meq/L (ref 3.7–5.3)
SODIUM: 136 meq/L — AB (ref 137–147)
Total Bilirubin: 0.2 mg/dL — ABNORMAL LOW (ref 0.3–1.2)
Total Protein: 5.9 g/dL — ABNORMAL LOW (ref 6.0–8.3)

## 2013-07-01 LAB — CBC WITH DIFFERENTIAL/PLATELET
BASOS PCT: 0 % (ref 0–1)
Basophils Absolute: 0 10*3/uL (ref 0.0–0.1)
EOS ABS: 0.1 10*3/uL (ref 0.0–0.7)
Eosinophils Relative: 2 % (ref 0–5)
HCT: 23.4 % — ABNORMAL LOW (ref 36.0–46.0)
HEMOGLOBIN: 7.1 g/dL — AB (ref 12.0–15.0)
Lymphocytes Relative: 19 % (ref 12–46)
Lymphs Abs: 1.1 10*3/uL (ref 0.7–4.0)
MCH: 31.8 pg (ref 26.0–34.0)
MCHC: 30.3 g/dL (ref 30.0–36.0)
MCV: 104.9 fL — ABNORMAL HIGH (ref 78.0–100.0)
MONOS PCT: 10 % (ref 3–12)
Monocytes Absolute: 0.6 10*3/uL (ref 0.1–1.0)
NEUTROS ABS: 4.1 10*3/uL (ref 1.7–7.7)
NEUTROS PCT: 69 % (ref 43–77)
Platelets: 312 10*3/uL (ref 150–400)
RBC: 2.23 MIL/uL — ABNORMAL LOW (ref 3.87–5.11)
RDW: 19.1 % — ABNORMAL HIGH (ref 11.5–15.5)
WBC: 6 10*3/uL (ref 4.0–10.5)

## 2013-07-01 LAB — PREPARE RBC (CROSSMATCH)

## 2013-07-01 LAB — PROTIME-INR
INR: 1.23 (ref 0.00–1.49)
INR: 1.26 (ref 0.00–1.49)
Prothrombin Time: 15.2 seconds (ref 11.6–15.2)
Prothrombin Time: 15.5 seconds — ABNORMAL HIGH (ref 11.6–15.2)

## 2013-07-01 LAB — APTT: APTT: 29 s (ref 24–37)

## 2013-07-01 LAB — POC OCCULT BLOOD, ED: Fecal Occult Bld: POSITIVE — AB

## 2013-07-01 MED ORDER — SIMVASTATIN 20 MG PO TABS
40.0000 mg | ORAL_TABLET | Freq: Every day | ORAL | Status: DC
  Administered 2013-07-02 – 2013-07-04 (×4): 40 mg via ORAL
  Filled 2013-07-01 (×4): qty 2

## 2013-07-01 MED ORDER — NEPRO/CARBSTEADY PO LIQD
237.0000 mL | ORAL | Status: DC | PRN
  Filled 2013-07-01: qty 237

## 2013-07-01 MED ORDER — ONDANSETRON HCL 4 MG/2ML IJ SOLN: 4.0000 mg | Freq: Four times a day (QID) | INTRAMUSCULAR | Status: DC | PRN

## 2013-07-01 MED ORDER — PANTOPRAZOLE SODIUM 40 MG IV SOLR
40.0000 mg | Freq: Two times a day (BID) | INTRAVENOUS | Status: DC
  Administered 2013-07-01 – 2013-07-02 (×2): 40 mg via INTRAVENOUS
  Filled 2013-07-01 (×3): qty 40

## 2013-07-01 MED ORDER — LIDOCAINE-PRILOCAINE 2.5-2.5 % EX CREA: 1.0000 "application " | TOPICAL_CREAM | CUTANEOUS | Status: DC | PRN

## 2013-07-01 MED ORDER — SODIUM CHLORIDE 0.9 % IJ SOLN
3.0000 mL | Freq: Two times a day (BID) | INTRAMUSCULAR | Status: DC
  Administered 2013-07-02 – 2013-07-03 (×3): 3 mL via INTRAVENOUS

## 2013-07-01 MED ORDER — PENTAFLUOROPROP-TETRAFLUOROETH EX AERO
1.0000 "application " | INHALATION_SPRAY | CUTANEOUS | Status: DC | PRN
  Filled 2013-07-01: qty 103.5

## 2013-07-01 MED ORDER — SODIUM CHLORIDE 0.9 % IV SOLN
INTRAVENOUS | Status: DC
  Administered 2013-07-01: 16:00:00 via INTRAVENOUS

## 2013-07-01 MED ORDER — INSULIN ASPART 100 UNIT/ML ~~LOC~~ SOLN
0.0000 [IU] | Freq: Three times a day (TID) | SUBCUTANEOUS | Status: DC
  Administered 2013-07-02 – 2013-07-04 (×4): 3 [IU] via SUBCUTANEOUS
  Administered 2013-07-04: 5 [IU] via SUBCUTANEOUS
  Administered 2013-07-04 – 2013-07-05 (×2): 3 [IU] via SUBCUTANEOUS
  Administered 2013-07-05: 11 [IU] via SUBCUTANEOUS

## 2013-07-01 MED ORDER — ONDANSETRON HCL 4 MG PO TABS: 4.0000 mg | ORAL_TABLET | Freq: Four times a day (QID) | ORAL | Status: DC | PRN

## 2013-07-01 MED ORDER — SODIUM CHLORIDE 0.9 % IV SOLN: INTRAVENOUS | Status: DC

## 2013-07-01 MED ORDER — ALPRAZOLAM 0.5 MG PO TABS
0.5000 mg | ORAL_TABLET | Freq: Every evening | ORAL | Status: DC | PRN
  Filled 2013-07-01: qty 1

## 2013-07-01 MED ORDER — INSULIN ASPART 100 UNIT/ML ~~LOC~~ SOLN
0.0000 [IU] | Freq: Every day | SUBCUTANEOUS | Status: DC
  Administered 2013-07-04: 3 [IU] via SUBCUTANEOUS

## 2013-07-01 MED ORDER — CARVEDILOL 3.125 MG PO TABS: 3.1250 mg | ORAL_TABLET | Freq: Two times a day (BID) | ORAL | Status: DC

## 2013-07-01 MED ORDER — SODIUM CHLORIDE 0.9 % IV SOLN: 100.0000 mL | INTRAVENOUS | Status: DC | PRN

## 2013-07-01 MED ORDER — HEPARIN SODIUM (PORCINE) 1000 UNIT/ML DIALYSIS
1000.0000 [IU] | INTRAMUSCULAR | Status: DC | PRN
  Filled 2013-07-01: qty 1

## 2013-07-01 MED ORDER — ALTEPLASE 2 MG IJ SOLR
2.0000 mg | Freq: Once | INTRAMUSCULAR | Status: DC | PRN
  Filled 2013-07-01: qty 2

## 2013-07-01 MED ORDER — ACETAMINOPHEN 650 MG RE SUPP: 650.0000 mg | Freq: Four times a day (QID) | RECTAL | Status: DC | PRN

## 2013-07-01 MED ORDER — ACETAMINOPHEN 325 MG PO TABS: 650.0000 mg | ORAL_TABLET | Freq: Four times a day (QID) | ORAL | Status: DC | PRN

## 2013-07-01 MED ORDER — AMIODARONE HCL 200 MG PO TABS
200.0000 mg | ORAL_TABLET | Freq: Every day | ORAL | Status: DC
  Administered 2013-07-02 – 2013-07-05 (×4): 200 mg via ORAL
  Filled 2013-07-01 (×4): qty 1

## 2013-07-01 MED ORDER — LIDOCAINE HCL (PF) 1 % IJ SOLN: 5.0000 mL | INTRAMUSCULAR | Status: DC | PRN

## 2013-07-01 NOTE — H&P (Signed)
[facility] History and Physical  [name]:[id number] DOB: [birth year] DOA: 07/01/2013  Referring physician:  PCP: Shirline Frees, MD   Chief Complaint: BRBPR  HPI: [name] is a 68 y.o. female with past medical history that includes diabetes, coronary artery disease with severe mitral regurg who underwent surgical repair in March 2015, end-stage renal disease currently on hemodialysis, obesity, since to the emergency department from [facility] with the chief complaint of bright red blood per rectum. Patient indicates that she was admitted to the [facility] 2 days ago from [facility] after undergoing cardiac thoracic surgery March 2015. She states that she got up this morning and was walking to the bathroom and her bowels move before she could get there. She states the blood was bright red. He denies any abdominal pain nausea vomiting. She denies any NSAID use but does take one 325 mg aspirin daily. Associated symptoms include increased fatigue and some shortness of breath with activity. She denies dizziness syncope or near-syncope. She denies chest pain palpitations. Initial evaluation includes complete blood count significant for hemoglobin of 7.1. Basic metabolic panel significant for creatinine of 6.02 and a BUN of 56. At the time of my exam she had a moderate amount of maroon-colored blood per rectum. She is hemodynamically stable and not hypoxic. The first of 2 units of packed red blood cells infusing.  Review of Systems:  10 point review of systems complete and all systems are negative except as indicated in the history of present illness   Past Medical History  Diagnosis Date  . Diabetes mellitus   . Hypertension   . Hyperlipidemia   . Coronary artery disease   . Dialysis patient     T/Th/[facility]-Dr Hinda Lenis, stops hemodialysis 11-28-2012, will do peritoneal dialysis  starting 12-01-2012   . Carpal tunnel syndrome   . Anemia   . Myocardial infarction 2009    . Ovarian cancer 1995    [name]  . Colon polyps 01/2012    Per colonoscopy, Dr. Gala Romney  . CKD (chronic kidney disease)     peritoneal dialysis at home nightly.  . Transfusion history 03-04-13    Transfusion x1 unit a month ago-[facility]  . Stroke 2010    2010-"brief weakness,tongue thickness,incoherent"-no residual affects.   Past Surgical History  Procedure Laterality Date  . Complete abdominal hysterectomy  1995  . Abdominal hysterectomy    . Colonoscopy with esophagogastroduodenoscopy (egd)  01/27/2012    Procedure: COLONOSCOPY WITH ESOPHAGOGASTRODUODENOSCOPY (EGD);  Surgeon: Daneil Dolin, MD;  Location[facility];  Service: Endoscopy;  Laterality: N/A;  1:45  . Eus  04/09/2012    Procedure: UPPER ENDOSCOPIC ULTRASOUND (EUS) LINEAR;  Surgeon: Milus Banister, MD;  Location: [facility]COPY;  Service: Endoscopy;  Laterality: N/A;  . Tonsillectomy      child  . Peritoneal catheter insertion  10/30/2012    for peritoneal dialysis  . Back surgery  2009    x3 level fusion-Dr. Louanne Skye  . Cardiac catheterization      x2 stents-'09- Dr. Joellen Jersey  . Esophagogastroduodenoscopy (egd) with propofol N/A 03/11/2013    Procedure: ESOPHAGOGASTRODUODENOSCOPY (EGD) WITH PROPOFOL;  Surgeon: Milus Banister, MD;  Location: [facility] ENDOSCOPY;  Service: Endoscopy;  Laterality: N/A;   Social History:  reports that she has quit smoking. She has never used smokeless tobacco. She reports that she does not drink alcohol or use illicit drugs.  Allergies  Allergen Reactions  . Lisinopril Swelling  Family History  Problem Relation Age of Onset  . Diabetes Mother      Prior to Admission medications   Medication Sig Start Date End Date Taking? Authorizing Provider  ALPRAZolam Duanne Moron) 0.5 MG tablet Take one tablet by mouth at bedtime as needed for anxiety 06/28/13  Yes Tiffany L Reed, DO  Amino Acids-Protein Hydrolys (FEEDING SUPPLEMENT, PRO-STAT SUGAR FREE 64,) LIQD Take 30 mLs  by mouth 2 (two) times daily between meals.   Yes Historical Provider, MD  amiodarone (PACERONE) 200 MG tablet Take 200 mg by mouth daily.   Yes Historical Provider, MD  aspirin EC 81 MG tablet Take 81 mg by mouth daily.   Yes Historical Provider, MD  b complex-vitamin c-folic acid (NEPHRO-VITE) 0.8 MG TABS tablet Take 0.8 mg by mouth every morning.   Yes Historical Provider, MD  carvedilol (COREG) 3.125 MG tablet Take 3.125 mg by mouth 2 (two) times daily with a meal.   Yes Historical Provider, MD  ciprofloxacin (CIPRO) 500 MG tablet Take 500 mg by mouth 2 (two) times daily. For 10 days. (06/30/13-07/10/13)   Yes Historical Provider, MD  clopidogrel (PLAVIX) 75 MG tablet Take 75 mg by mouth daily with breakfast.   Yes Historical Provider, MD  Insulin Isophane & Regular (HUMULIN 70/30 [place]) Inject 15-20 Units into the skin 2 (two) times daily. 20 units in the morning   15 units at night   Yes Historical Provider, MD  meclizine (ANTIVERT) 25 MG tablet Take 50 mg by mouth 2 (two) times daily as needed for dizziness.   Yes Historical Provider, MD  pantoprazole (PROTONIX) 40 MG tablet Take 40 mg by mouth every evening.   Yes Historical Provider, MD  simvastatin (ZOCOR) 40 MG tablet Take 40 mg by mouth at bedtime.   Yes Historical Provider, MD   Physical Exam: Filed Vitals:   07/01/13 1232  BP: 118/41  Pulse: 73  Temp: 98.4 F (36.9 C)  Resp: 22    BP 118/41  Pulse 73  Temp(Src) 98.4 F (36.9 C) (Oral)  Resp 22  Ht 5\' 5"  (1.651 m)  Wt 68.04 kg (150 lb)  BMI 24.96 kg/m2  SpO2 97%  General:  Appears calm and comfortable somewhat pale Eyes: PERRL, normal lids, irises & conjunctiva ENT: grossly normal hearing, lips & tongue Neck: no LAD, masses or thyromegaly Cardiovascular: RRR, no m/r/g. No LE edema. Telemetry: SR, no arrhythmias  Respiratory: Mild increased work of breathing with exertion. Breath sounds clear bilaterally. I hear no rales no rhonchi Abdomen: soft, ntnd obese positive  bowel sounds throughout nontender to palpation no mass organomegaly no guarding or rebounding. Several bruising areas. Peritoneal dialysis catheter intact Skin: no rash or induration seen on limited exam. Several areas of ecchymosis on abdomen and right arm Musculoskeletal: grossly normal tone BUE/BLE joints without swelling/erythema AV graft right arm intact. Medial aspect of left knee with 2-3 stitches noted area with erythema some swelling. No drainage Psychiatric: grossly normal mood and affect, speech fluent and appropriate Neurologic: grossly non-focal.          Labs on Admission:  Basic Metabolic Panel:  Recent Labs Lab 07/01/13 0939  NA 136*  K 5.3  CL 97  CO2 27  GLUCOSE 154*  BUN 56*  CREATININE 6.02*  CALCIUM 8.1*   Liver Function Tests:  Recent Labs Lab 07/01/13 0939  AST 18  ALT 18  ALKPHOS 78  BILITOT 0.2*  PROT 5.9*  ALBUMIN 1.9*   No results found for this  basename: LIPASE, AMYLASE,  in the last 168 hours No results found for this basename: AMMONIA,  in the last 168 hours CBC:  Recent Labs Lab 07/01/13 0939  WBC 6.0  NEUTROABS 4.1  HGB 7.1*  HCT 23.4*  MCV 104.9*  PLT 312   Cardiac Enzymes: No results found for this basename: CKTOTAL, CKMB, CKMBINDEX, TROPONINI,  in the last 168 hours  BNP (last 3 results) No results found for this basename: PROBNP,  in the last 8760 hours CBG:  Recent Labs Lab 06/30/13 0650 06/30/13 1046 06/30/13 1642 06/30/13 2100 07/01/13 0659  GLUCAP 197* 147* 215* 161* 128*    Radiological Exams on Admission: No results found.  EKG:  Assessment/Plan Principal Problem:   Acute blood loss anemia: In the setting of bright red blood per rectum in patient with history of anemia. Will admit to telemetry. Will transfuse 2 units of packed red blood cells. Will obtain serial CBCs. Currently she is hemodynamically stable. Will monitor closely Active Problems: GI bleeding: As evidence by bright red blood paretic  complete. Will admit to telemetry. Will provide IV protonix. Will keep n.p.o. until evaluated by GI. Chart review indicates that patient underwent EGD in December of 2014 due to mucosal abnormality of the duodenum noted by Dr. Verlene Mayer in January 2014.. Ampullary diverticula noted. Also with history of colonic polyps per colonoscopy in 2013. Patient on Plavix will hold this    HYPERTENSION: Systolic blood pressure in the emergency department 104- 152. Home medications include amiodarone Coreg. Will continue with parameters.    CAD, NATIVE VESSEL: No chest pain. In March of 2015 patient underwent 3 vessel CABG with mitral valve fair. Will monitor on telemetry. Hold aspirin for now do to #1 and #2    Chronic diastolic heart failure: Patient on amiodarone. Will continue this for now with parameters. Will monitor daily weight and strict intake and output    ESRD (end stage renal disease): Patient is a Tuesday Thursday Saturday hemodialysis patient. Creatinine is currently greater than 6. Will request renal    Macrocytic anemia: History of same. Has had GI evaluation in the past with no explanation for chronic anemia. See #1.    Obesity, morbid: Nutritional consult    Type II or unspecified type diabetes mellitus with renal manifestations, uncontrolled: Will obtain hemoglobin A1c. Will use sliding scale insulin for optimal control.    GI Renal  Code Status: full Family Communication: none present Disposition Plan: [facility] ready  Time spent: [facility] Pager [phone]

## 2013-07-01 NOTE — Consult Note (Signed)
Referring Provider: Kathie Dike, MD Primary Care Physician:  Shirline Frees, MD Primary Gastroenterologist:  Garfield Cornea, MD   Reason for Consultation:  GI bleed  HPI: LAQUIDA COTRELL is a 68 y.o. female admitted for acute onset GI bleeding. Recent admission to [facility] for rehabilitation after undergoing 3 vessel CABG, mitral valve annuloplasty, Maze procedure on 06/17/2013. Postoperatively she had some problems with hemodialysis due to poor flow in her AV fistula. She had to have repair. She also developed peritonitis as documented by elevated leukocyte count in her peritoneal fluid on 06/23/2013. Had been doing peritoneal dialysis preoperatively. Her cultures were negative. She has been receiving vancomycin and cefepime he had hemodialysis. Plans for 2 weeks of therapy. At [facility], on 06/25/2013 her hemoglobin was 8.8, hematocrit 28, MCV 103.3.  She presented today with complaints of acute onset GI bleeding. She felt the urge to have a bowel movement and passed initially bright red blood. Subsequent episodes have been more consistent of maroon-colored stool. Maroon colored stool at time of DRE in ER. Patient's husband reports last episode was purple/black. She has had 3 episodes of bleeding today. When she presented her hemoglobin was down more than 1 g from April 3. She denies any abdominal pain. She feels very weak. No short of breath. No chest pain. No vomiting, heartburn, dysphagia. No diarrhea. She is on aspirin and Plavix but denies other NSAIDs. Takes pantoprazole daily.  Her last EGD was by Dr. Owens Loffler 02/2013, she had a small duodenal diverticulum, edematous duodenal mucosa with friability on biopsy, negative pathology. Previous EUS in January 2014 unremarkable outside of nonspecific edema/thickening of periampullary duodenum. Colonoscopy November 2013 showed a single left-sided colonic diverticulum, 2 colon polyps/hamartomatous.    Prior to Admission medications    Medication Sig Start Date End Date Taking? Authorizing Provider  ALPRAZolam Duanne Moron) 0.5 MG tablet Take one tablet by mouth at bedtime as needed for anxiety 06/28/13  Yes Tiffany L Reed, DO  Amino Acids-Protein Hydrolys (FEEDING SUPPLEMENT, PRO-STAT SUGAR FREE 64,) LIQD Take 30 mLs by mouth 2 (two) times daily between meals.   Yes Historical Provider, MD  amiodarone (PACERONE) 200 MG tablet Take 200 mg by mouth daily.   Yes Historical Provider, MD  aspirin EC 81 MG tablet Take 81 mg by mouth daily.   Yes Historical Provider, MD  b complex-vitamin c-folic acid (NEPHRO-VITE) 0.8 MG TABS tablet Take 0.8 mg by mouth every morning.   Yes Historical Provider, MD  carvedilol (COREG) 3.125 MG tablet Take 3.125 mg by mouth 2 (two) times daily with a meal.   Yes Historical Provider, MD  ciprofloxacin (CIPRO) 500 MG tablet Take 500 mg by mouth 2 (two) times daily. For 10 days. (06/30/13-07/10/13)   Yes Historical Provider, MD  clopidogrel (PLAVIX) 75 MG tablet Take 75 mg by mouth daily with breakfast.   Yes Historical Provider, MD  Insulin Isophane & Regular (HUMULIN 70/30 [place]) Inject 15-20 Units into the skin 2 (two) times daily. 20 units in the morning   15 units at night   Yes Historical Provider, MD  meclizine (ANTIVERT) 25 MG tablet Take 50 mg by mouth 2 (two) times daily as needed for dizziness.   Yes Historical Provider, MD  pantoprazole (PROTONIX) 40 MG tablet Take 40 mg by mouth every evening.   Yes Historical Provider, MD  simvastatin (ZOCOR) 40 MG tablet Take 40 mg by mouth at bedtime.   Yes Historical Provider, MD    Current Facility-Administered Medications  Medication Dose Route  Frequency Provider Last Rate Last Dose  . 0.9 %  sodium chloride infusion   Intravenous Continuous Radene Gunning, NP      . acetaminophen (TYLENOL) tablet 650 mg  650 mg Oral Q6H PRN Radene Gunning, NP       Or  . acetaminophen (TYLENOL) suppository 650 mg  650 mg Rectal Q6H PRN Radene Gunning, NP      . ALPRAZolam Duanne Moron)  tablet 0.5 mg  0.5 mg Oral QHS PRN Radene Gunning, NP      . Derrill Memo ON 07/02/2013] amiodarone (PACERONE) tablet 200 mg  200 mg Oral Daily Lezlie Octave Black, NP      . insulin aspart (novoLOG) injection 0-15 Units  0-15 Units Subcutaneous TID WC Lezlie Octave Black, NP      . insulin aspart (novoLOG) injection 0-5 Units  0-5 Units Subcutaneous QHS Radene Gunning, NP      . ondansetron [facility]) tablet 4 mg  4 mg Oral Q6H PRN Radene Gunning, NP       Or  . ondansetron [facility]) injection 4 mg  4 mg Intravenous Q6H PRN Radene Gunning, NP      . pantoprazole (PROTONIX) injection 40 mg  40 mg Intravenous Q12H Lezlie Octave Black, NP      . simvastatin (ZOCOR) tablet 40 mg  40 mg Oral QHS Lezlie Octave Black, NP      . sodium chloride 0.9 % injection 3 mL  3 mL Intravenous Q12H Radene Gunning, NP        Allergies as of 07/01/2013 - Review Complete 07/01/2013  Allergen Reaction Noted  . Lisinopril Swelling 08/30/2012    Past Medical History  Diagnosis Date  . Diabetes mellitus   . Hypertension   . Hyperlipidemia   . Coronary artery disease   . Dialysis patient     [facility]-Dr Hinda Lenis, stops hemodialysis 11-28-2012, will do peritoneal dialysis  starting 12-01-2012   . Carpal tunnel syndrome   . Anemia   . Ovarian cancer 1995    [name]  . Colon polyps 01/2012    Per colonoscopy, Dr. Gala Romney  . CKD (chronic kidney disease)     peritoneal dialysis at home nightly.  . Transfusion history 03-04-13    Transfusion x1 unit a month ago-[facility]  . Stroke 2010    2010-"brief weakness,tongue thickness,incoherent"-no residual affects.  . Mitral regurgitation   . Myocardial infarction 2009  . Peritonitis 06/23/2013    Past Surgical History  Procedure Laterality Date  . Complete abdominal hysterectomy  1995  . Abdominal hysterectomy    . Colonoscopy with esophagogastroduodenoscopy (egd)  01/27/2012    Dr. Gala Romney. Small hiatal hernia, abnormal second portion of the duodenum with questionable extrinsic  compression. Single left sided colonic diverticulum, 2 colon polyps. Hamartomatous colon polyp  . Eus  04/09/2012    Dr. Owens Loffler: Nonspecific edema/thickening of the periampullary duodenum. Unremarkable biopsy  . Tonsillectomy      child  . Peritoneal catheter insertion  10/30/2012    for peritoneal dialysis  . Back surgery  2009    x3 level fusion-Dr. Louanne Skye  . Cardiac catheterization      x2 stents-'09- Dr. Joellen Jersey  . Esophagogastroduodenoscopy (egd) with propofol N/A 03/11/2013    Dr. Owens Loffler, small duodenal diverticulum. Edematous duodenal mucosa with friability on biopsy (pathology negative)  . Three-vessel cabg, mitral valve annuloplasty, may ease  06/17/2013    [facility]  . Peritonitis  [facility], 06/23/2013 with elevated white blood cell count on peritoneal fluid, negative cultures. Receiving vanc and cefepime at time of dialysis for 2 weeks    Family History  Problem Relation Age of Onset  . Diabetes Mother     History   Social History  . Marital Status: Married    Spouse Name: N/A    Number of Children: 3  . Years of Education: N/A   Occupational History  . RETIRED    Social History Main Topics  . Smoking status: Former Smoker -- 0.50 packs/day for 15 years  . Smokeless tobacco: Never Used  . Alcohol Use: No  . Drug Use: No  . Sexual Activity: No   Other Topics Concern  . Not on file   Social History Narrative   3 grown healthy children   Lives w/ husband   Has 8 grandkids     ROS:  General: Negative for anorexia, weight loss, fever, chills. Positive fatigue, weakness. Eyes: Negative for vision changes.  ENT: Negative for hoarseness, difficulty swallowing , nasal congestion. CV: Negative for chest pain, angina, palpitations, dyspnea on exertion, peripheral edema.  Respiratory: Negative for dyspnea at rest, dyspnea on exertion, cough, sputum, wheezing.  GI: See history of present illness. GU:  Negative for dysuria, hematuria,  urinary incontinence, urinary frequency, nocturnal urination.  MS: Negative for joint pain, low back pain.  Derm: Negative for rash or itching.  Neuro: Negative for weakness, abnormal sensation, seizure, frequent headaches, memory loss, confusion.  Psych: Negative for anxiety, depression, suicidal ideation, hallucinations.  Endo: Negative for unusual weight change.  Heme: Positive bruising  Allergy: Negative for rash or hives.       Physical Examination: Vital signs in last 24 hours: Temp:  [97.8 F (36.6 C)-98.4 F (36.9 C)] 98.1 F (36.7 C) (04/09 1517) Pulse Rate:  [67-77] 77 (04/09 1517) Resp:  [16-30] 24 (04/09 1517) BP: (104-118)/(38-44) 116/44 mmHg (04/09 1517) SpO2:  [96 %-100 %] 100 % (04/09 1517) Weight:  [150 lb (68.04 kg)] 150 lb (68.04 kg) (04/09 0907)    General: Pale appearing, elderly female in no acute distress. Husband at bedtime  Head: Normocephalic, atraumatic.   Eyes: Conjunctiva pale, no icterus. Mouth: Oropharyngeal mucosa moist and pink , no lesions erythema or exudate. Neck: Supple without thyromegaly, masses, or lymphadenopathy.  Lungs: Clear to auscultation bilaterally.  Heart: Regular rate and rhythm, no murmurs rubs or gallops. Sternal incision well healing Abdomen: Bowel sounds are normal, nontender, nondistended, no hepatosplenomegaly or masses, no abdominal bruits or    hernia , no rebound or guarding.   Rectal: Not performed Extremities: No lower extremity edema, clubbing, deformity. Diffuse lower extremity purpura, below the knees. Neuro: Alert and oriented x 4 , grossly normal neurologically.  Skin: Warm and dry, no rash or jaundice.   Psych: Alert and cooperative, normal mood and affect.        Intake/Output from previous day:   Intake/Output this shift:    Lab Results: CBC  Recent Labs  07/01/13 0939  WBC 6.0  HGB 7.1*  HCT 23.4*  MCV 104.9*  PLT 312   BMET  Recent Labs  07/01/13 0939  NA 136*  K 5.3  CL 97  CO2 27   GLUCOSE 154*  BUN 56*  CREATININE 6.02*  CALCIUM 8.1*   LFT  Recent Labs  07/01/13 0939  BILITOT 0.2*  ALKPHOS 78  AST 18  ALT 18  PROT 5.9*  ALBUMIN 1.9*     PT/INR )  Recent Labs  07/01/13 0939  LABPROT 15.2  INR 1.23      Lab Results  Component Value Date   VITAMINB12 445 01/10/2013   Lab Results  Component Value Date   FOLATE >20.0 01/10/2013    Imaging Studies: Dg Chest 2 View  06/27/2013   CLINICAL DATA:  Shortness of breath.  EXAM: CHEST  2 VIEW  COMPARISON:  01/10/2013.  FINDINGS: The heart is enlarged but stable. There are surgical changes from bypass surgery. There is vascular congestion and possible mild interstitial edema. A moderate-sized left pleural effusion is noted with overlying atelectasis.  IMPRESSION: Cardiac enlargement with probable interstitial pulmonary edema and a moderate-sized left pleural effusion with overlying atelectasis.   Electronically Signed   By: Kalman Jewels M.D.   On: 06/27/2013 21:00  [4 week]   Impression: 68 year old lady with multiple comorbidities including end-stage renal disease, recently peritoneal dialysis discontinued in the setting of peritonitis and undergoing hemodialysis, recent coronary artery bypass graft/mitral valve annuloplasty/Maze procedure who presents with acute onset GI bleeding. Initially bright red blood per rectum, last episode more maroon-colored stool. Drop in hemoglobin of at least 1 g since last week. Otherwise asymptomatic from GI standpoint. Hemodynamically stable. She is on Plavix and aspirin. History of duodenal abnormality without any evidence of malignancy as outlined above. Last EGD December 2014, last colonoscopy November 2013.  Suspect bleeding may be from more proximal source such as duodenum/distal small bowel/proximal colon.   Plan: 1. Discussed with Dr. Gala Romney. Plan for EGD with possible small bowel capsule deployment tomorrow. 2. Agree with PPI therapy. 3. Monitor H&H, transfuse  as needed. 4. Evaluation of lower extremity purpura per attending.  We would like to thank you for the opportunity to participate in the care of [place].    LOS: 0 days   Mahala Menghini  07/01/2013, 3:28 PM  Attending note:  Pt seen and examined this afternoon.  Discussed briefly with Dr. Hinda Lenis.  Agree with assessment and recommendations as above.  Given findings of GI evaluation in past 18 months and current presentation, will initiate evaluation with EGD followed by capsule as appropriate.  I explained to pt and spouse Dr. Oneida Alar will be performing procedure tomorrow in my absence.  Risks/ benefits reviewed.  All parties agreeable.

## 2013-07-01 NOTE — Procedures (Signed)
   HEMODIALYSIS TREATMENT NOTE:  3.5 hour heparin-free dialysis completed via right upper arm AVF (16g/antegrade) which was bruised and swollen prior to admission.  Goal NOT met:  BP unable to tolerate prescribed goal of 2 liters.  SBP<90 within 15 minutes of dialysis.  Ultrafiltration was interrupted for a total of 2 hours for hypotension (SBP 70s x2) but this was corrected with NS boluses and PRBC transfusion. Pt received one unit of blood in ED however Hgb was 6.2 after that transfusion.  An additional two units were ordered and administered during dialysis.  SBP 120-130 during last hour of HD.  Pt evacuated approximately 300cc of burgundy-colored liquid stool during session.  All blood was reinfused and hemostasis was achieved within 10 minutes.  Report given to Lindsi, Therapist, sports.  Total HD time = 3.5 hours Intake = 1570cc UF Removed = 1700 Net UF = 130cc  Giada Schoppe L. Jaishaun Mcnab, RN, CDN

## 2013-07-01 NOTE — ED Notes (Signed)
Computer frozen. Unable to document on flow sheet of rate changed to 150 ml/hr. NP in to eva pt for admission. Pt has a moderate amount of dark burgundy stool

## 2013-07-01 NOTE — ED Provider Notes (Signed)
CSN: [id number]     Arrival date & time 07/01/13  [id number] History  This chart was scribed for Janice Norrie, MD by Delphia Grates, ED Scribe. This patient was seen in room APA19/APA19 and the patient's care was started at 9:26 AM.   Chief Complaint  Patient presents with  . Rectal Bleeding    The history is provided by the patient and medical records. No language interpreter was used.    HPI Comments: [name] is a 68 y.o. female with h/o CKD on  dialysis, DM, HTN, and hyperlipidemia brought in from [facility] to the Emergency Department complaining of rectal bleeding that began this morning at 8 AM.  Pt states she was walking to the restroom this morning to move her bowels and "before I got there it started bleeding."  She states the blood was bright red.  She has had 1 further episode of bleeding since then.  She denies abdominal pain, nausea, dizziness or weakness.  She denies having any recent hemorrhoids to her knowledge.  Husband denies any recent new pallor.  Pt denies prior h/o similar symptoms.  She did receive a transfusion while hospitalized in October 2014 for anemia thought to be likely secondary to end-stage renal disease.  She has had a colonoscopy recently which was normal.  She states she is not on anticoagulants.  She has been living at the [facility] for rehab since open heart surgery done at [facility] on 06/07/13, after which she also received one unit of blood.  She reports valve replacement and bypass of blockages. She plans to return home after rehab.    She has had endoscopy which showed some type of lesion in her duodenum, however she reports her colonoscopy was reportedly normal.  PCP Dr Tomasita Crumble GI is Dr. Gala Romney and Gala Romney   Past Medical History  Diagnosis Date  . Diabetes mellitus   . Hypertension   . Hyperlipidemia   . Coronary artery disease   . Dialysis patient     [facility]Dr Hinda Lenis, stops hemodialysis 11-28-2012, will do peritoneal dialysis  starting  12-01-2012   . Carpal tunnel syndrome   . Anemia   . Myocardial infarction 2009  . Ovarian cancer 1995    [name]  . Colon polyps 01/2012    Per colonoscopy, Dr. Gala Romney  . CKD (chronic kidney disease)     peritoneal dialysis at home nightly.  . Transfusion history 03-04-13    Transfusion x1 unit a month ago-[facility]  . Stroke 2010    2010-"brief weakness,tongue thickness,incoherent"-no residual affects.    Past Surgical History  Procedure Laterality Date  . Complete abdominal hysterectomy  1995  . Abdominal hysterectomy    . Colonoscopy with esophagogastroduodenoscopy (egd)  01/27/2012    Procedure: COLONOSCOPY WITH ESOPHAGOGASTRODUODENOSCOPY (EGD);  Surgeon: Daneil Dolin, MD;  Location: [facility]O [facility];  Service: Endoscopy;  Laterality: N/A;  1:45  . Eus  04/09/2012    Procedure: UPPER ENDOSCOPIC ULTRASOUND (EUS) LINEAR;  Surgeon: Milus Banister, MD;  Location: [facility] ENDOSCOPY;  Service: Endoscopy;  Laterality: N/A;  . Tonsillectomy      child  . Peritoneal catheter insertion  10/30/2012    for peritoneal dialysis  . Back surgery  2009    x3 level fusion-Dr. Louanne Skye  . Cardiac catheterization      x2 stents-'09- Dr. Joellen Jersey  . Esophagogastroduodenoscopy (egd) with propofol N/A 03/11/2013    Procedure: ESOPHAGOGASTRODUODENOSCOPY (EGD) WITH PROPOFOL;  Surgeon: Milus Banister, MD;  Location: [facility] ENDOSCOPY;  Service: Endoscopy;  Laterality: N/A;    Family History  Problem Relation Age of Onset  . Diabetes Mother     History  Substance Use Topics  . Smoking status: Former Smoker -- 0.50 packs/day for 15 years  . Smokeless tobacco: Never Used  . Alcohol Use: No  pt lives with spouse In [facility] for rehab after open heart surgery  OB History   Grav Para Term Preterm Abortions TAB SAB Ect Mult Living                   Review of Systems  Gastrointestinal: Positive for anal bleeding. Negative for nausea and abdominal pain.  Skin: Negative for pallor.   Neurological: Negative for dizziness and weakness.  All other systems reviewed and are negative.     Allergies  Lisinopril  Home Medications   Current Outpatient Rx  Name  Route  Sig  Dispense  Refill  . ALPRAZolam (XANAX) 0.5 MG tablet      Take one tablet by mouth at bedtime as needed for anxiety   30 tablet   5   . ALPRAZolam (XANAX) 1 MG tablet   Oral   Take 1 mg by mouth 3 (three) times daily as needed for anxiety.         Marland Kitchen aspirin EC 81 MG tablet   Oral   Take 81 mg by mouth daily.         Marland Kitchen b complex-vitamin c-folic acid (NEPHRO-VITE) 0.8 MG TABS tablet   Oral   Take 0.8 mg by mouth every morning.         . carvedilol (COREG) 12.5 MG tablet   Oral   Take 12.5 mg by mouth 2 (two) times daily with a meal.         . EPINEPHrine (EPIPEN) 0.3 mg/0.3 mL DEVI   Intramuscular   Inject 0.3 mLs (0.3 mg total) into the muscle once.   1 Device   1   . gentamicin cream (GARAMYCIN) 0.1 %   Topical   Apply 1 application topically daily.          . Insulin Isophane & Regular (HUMULIN 70/30 [place])   Subcutaneous   Inject 15-20 Units into the skin 2 (two) times daily. 20 units in the morning   15 units at night         . pantoprazole (PROTONIX) 40 MG tablet   Oral   Take 1 tablet (40 mg total) by mouth daily. 30 minutes before breakfast.   30 tablet   11   . simvastatin (ZOCOR) 40 MG tablet   Oral   Take 40 mg by mouth at bedtime.          BP 118/38  Pulse 67  Temp(Src) 97.9 F (36.6 C) (Oral)  Resp 18  Ht 5\' 5"  (1.651 m)  Wt 150 lb (68.04 kg)  BMI 24.96 kg/m2  SpO2 98%  Vital signs normal    Physical Exam  Nursing note and vitals reviewed. Constitutional: She is oriented to person, place, and time. She appears well-developed and well-nourished.  Non-toxic appearance. She does not appear ill. No distress.  HENT:  Head: Normocephalic and atraumatic.  Right Ear: External ear normal.  Left Ear: External ear normal.  Nose: Nose normal.  No mucosal edema or rhinorrhea.  Mouth/Throat: Oropharynx is clear and moist and mucous membranes are normal. No dental abscesses or uvula swelling.  Eyes: EOM are normal. Pupils are equal, round,  and reactive to light.  Conjunctiva pale  Neck: Normal range of motion and full passive range of motion without pain. Neck supple.  Cardiovascular: Normal rate, regular rhythm and normal heart sounds.  Exam reveals no gallop and no friction rub.   No murmur heard. Pulmonary/Chest: Effort normal and breath sounds normal. No respiratory distress. She has no wheezes. She has no rhonchi. She has no rales. She exhibits no tenderness and no crepitus.  Abdominal: Soft. Normal appearance and bowel sounds are normal. She exhibits no distension. There is no tenderness. There is no rebound and no guarding.  Genitourinary:  Large hemorrhoid from 9 to 12 o'clock, does not appear to be bleeding Stool sample maroon-bloody in color, no stool in vault  Musculoskeletal: Normal range of motion. She exhibits no edema and no tenderness.  Moves all extremities well.   Neurological: She is alert and oriented to person, place, and time. She has normal strength. No cranial nerve deficit.  Skin: Skin is warm, dry and intact. No rash noted. No erythema. There is pallor.  Psychiatric: She has a normal mood and affect. Her speech is normal and behavior is normal. Her mood appears not anxious.    ED Course  Procedures (including critical care time) Medications  0.9 %  sodium chloride infusion (not administered)     DIAGNOSTIC STUDIES: Oxygen Saturation is 98% on room air, normal by my interpretation.    COORDINATION OF CARE: 9:37 AM-Discussed treatment plan which includes IV fluids, labs, and stool sample with pt at bedside and pt agreed to plan.   After review of her laboratory results patient was prepared for blood transfusion. Her first unit of blood was started in the ED. Patient did have one episode of rectal  bleeding in the ED, the nurses report it was dark red.  11:59 Dr Roderic Palau, admit to tele, team 1     Results for orders placed during the hospital encounter of 07/01/13  CBC WITH DIFFERENTIAL      Result Value Ref Range   WBC 6.0  4.0 - 10.5 K/uL   RBC 2.23 (*) 3.87 - 5.11 MIL/uL   Hemoglobin 7.1 (*) 12.0 - 15.0 g/dL   HCT 23.4 (*) 36.0 - 46.0 %   MCV 104.9 (*) 78.0 - 100.0 fL   MCH 31.8  26.0 - 34.0 pg   MCHC 30.3  30.0 - 36.0 g/dL   RDW 19.1 (*) 11.5 - 15.5 %   Platelets 312  150 - 400 K/uL   Neutrophils Relative % 69  43 - 77 %   Neutro Abs 4.1  1.7 - 7.7 K/uL   Lymphocytes Relative 19  12 - 46 %   Lymphs Abs 1.1  0.7 - 4.0 K/uL   Monocytes Relative 10  3 - 12 %   Monocytes Absolute 0.6  0.1 - 1.0 K/uL   Eosinophils Relative 2  0 - 5 %   Eosinophils Absolute 0.1  0.0 - 0.7 K/uL   Basophils Relative 0  0 - 1 %   Basophils Absolute 0.0  0.0 - 0.1 K/uL  COMPREHENSIVE METABOLIC PANEL      Result Value Ref Range   Sodium 136 (*) 137 - 147 mEq/L   Potassium 5.3  3.7 - 5.3 mEq/L   Chloride 97  96 - 112 mEq/L   CO2 27  19 - 32 mEq/L   Glucose, Bld 154 (*) 70 - 99 mg/dL   BUN 56 (*) 6 - 23 mg/dL   Creatinine,  Ser 6.02 (*) 0.50 - 1.10 mg/dL   Calcium 8.1 (*) 8.4 - 10.5 mg/dL   Total Protein 5.9 (*) 6.0 - 8.3 g/dL   Albumin 1.9 (*) 3.5 - 5.2 g/dL   AST 18  0 - 37 U/L   ALT 18  0 - 35 U/L   Alkaline Phosphatase 78  39 - 117 U/L   Total Bilirubin 0.2 (*) 0.3 - 1.2 mg/dL   GFR calc non Af Amer 6 (*) >90 mL/min   GFR calc Af Amer 7 (*) >90 mL/min  APTT      Result Value Ref Range   aPTT 29  24 - 37 seconds  PROTIME-INR      Result Value Ref Range   Prothrombin Time 15.2  11.6 - 15.2 seconds   INR 1.23  0.00 - 1.49  POC OCCULT BLOOD, ED      Result Value Ref Range   Fecal Occult Bld POSITIVE (*) NEGATIVE  TYPE AND SCREEN      Result Value Ref Range   ABO/RH(D) B POS     Antibody Screen NEG     Sample Expiration 07/04/2013     Unit Number [id number]     Blood  Component Type RED CELLS,LR     Unit division 00     Status of Unit ALLOCATED     Transfusion Status OK TO TRANSFUSE     Crossmatch Result Compatible     Unit Number [id number]     Blood Component Type RED CELLS,LR     Unit division 00     Status of Unit ISSUED     Transfusion Status OK TO TRANSFUSE     Crossmatch Result Compatible    PREPARE RBC (CROSSMATCH)      Result Value Ref Range   Order Confirmation ORDER PROCESSED BY BLOOD BANK     Laboratory interpretation all normal except anemia, renal failure chronic, Hemoccult-positive   MDM   Final diagnoses:  Rectal bleeding  Anemia  ESRD on dialysis    Plan admission  CRITICAL CARE Performed by: Hanson Medeiros L Sae Handrich Total critical care time: 34 min Critical care time was exclusive of separately billable procedures and treating other patients. Critical care was necessary to treat or prevent imminent or life-threatening deterioration. Critical care was time spent personally by me on the following activities: development of treatment plan with patient and/or surrogate as well as nursing, discussions with consultants, evaluation of patient's response to treatment, examination of patient, obtaining history from patient or surrogate, ordering and performing treatments and interventions, ordering and review of laboratory studies, ordering and review of radiographic studies, pulse oximetry and re-evaluation of patient's condition.     I personally performed the services described in this documentation, which was scribed in my presence. The recorded information has been reviewed and considered.  Rolland Porter, MD, Abram Sander   Janice Norrie, MD 07/01/13 [phone]

## 2013-07-01 NOTE — Progress Notes (Signed)
CRITICAL VALUE ALERT  Critical value received:  HgB 6.2   Date of notification:  07/01/2013   Time of notification:  19:00  Critical value read back:yes  Nurse who received alert:  Tedd Sias  MD notified (1st page):  Mid-level  Time of first page:  19:07  MD notified (2nd page):  Time of second page:  Responding MD:  Mid-level  Time MD responded:  19:20

## 2013-07-01 NOTE — Consult Note (Signed)
Reason for Consult: End-stage renal disease Referring Physician: Dr.Memon  [name] is an 68 y.o. female.  HPI: She is a patient who has history of diabetes, hypertension, antral fibrillation and end-stage renal disease on maintenance hemodialysis presently came with the complaints of blood in the stool. According to patient she went to the bott[place] morning the first time did okay. A second time and she went patient noticed some blood in the stool. After that she has 3 episodes in 2 of them were after she came to the hospital. Patient denies any abdominal pain, she doesn't have any nausea or vomiting. Patient was recently admitted to [facility] for mitral valve replacement. And during her hospitalization she developed peritonitis and has been receiving antibiotics since then. Presently patient does not have any fever chills or sweating. She complains of some weakness otherwise feels okay.  Past Medical History  Diagnosis Date  . Diabetes mellitus   . Hypertension   . Hyperlipidemia   . Coronary artery disease   . Dialysis patient     [facility]-Dr Hinda Lenis, stops hemodialysis 11-28-2012, will do peritoneal dialysis  starting 12-01-2012   . Carpal tunnel syndrome   . Anemia   . Ovarian cancer 1995    [name]  . Colon polyps 01/2012    Per colonoscopy, Dr. Gala Romney  . CKD (chronic kidney disease)     peritoneal dialysis at home nightly.  . Transfusion history 03-04-13    Transfusion x1 unit a month ago-[facility]  . Stroke 2010    2010-"brief weakness,tongue thickness,incoherent"-no residual affects.  . Mitral regurgitation   . Myocardial infarction 2009  . Peritonitis 06/23/2013    Past Surgical History  Procedure Laterality Date  . Complete abdominal hysterectomy  1995  . Abdominal hysterectomy    . Colonoscopy with esophagogastroduodenoscopy (egd)  01/27/2012    Dr. Gala Romney. Small hiatal hernia, abnormal second portion of the duodenum with questionable  extrinsic compression. Single left sided colonic diverticulum, 2 colon polyps. Hamartomatous colon polyp  . Eus  04/09/2012    Dr. Owens Loffler: Nonspecific edema/thickening of the periampullary duodenum. Unremarkable biopsy  . Tonsillectomy      child  . Peritoneal catheter insertion  10/30/2012    for peritoneal dialysis  . Back surgery  2009    x3 level fusion-Dr. Louanne Skye  . Cardiac catheterization      x2 stents-'09- Dr. Joellen Jersey  . Esophagogastroduodenoscopy (egd) with propofol N/A 03/11/2013    Dr. Owens Loffler, small duodenal diverticulum. Edematous duodenal mucosa with friability on biopsy (pathology negative)  . Three-vessel cabg, mitral valve annuloplasty, may ease  06/17/2013    [facility]    Family History  Problem Relation Age of Onset  . Diabetes Mother     Social History:  reports that she has quit smoking. She has never used smokeless tobacco. She reports that she does not drink alcohol or use illicit drugs.  Allergies:  Allergies  Allergen Reactions  . Lisinopril Swelling    Medications: I have reviewed the patient's current medications.  Results for orders placed during the hospital encounter of 07/01/13 (from the past 48 hour(s))  POC OCCULT BLOOD, ED     Status: Abnormal   Collection Time    07/01/13  9:37 AM      Result Value Ref Range   Fecal Occult Bld POSITIVE (*) NEGATIVE  CBC WITH DIFFERENTIAL     Status: Abnormal   Collection Time    07/01/13  9:39 AM  Result Value Ref Range   WBC 6.0  4.0 - 10.5 K/uL   RBC 2.23 (*) 3.87 - 5.11 MIL/uL   Hemoglobin 7.1 (*) 12.0 - 15.0 g/dL   HCT 23.4 (*) 36.0 - 46.0 %   MCV 104.9 (*) 78.0 - 100.0 fL   MCH 31.8  26.0 - 34.0 pg   MCHC 30.3  30.0 - 36.0 g/dL   RDW 19.1 (*) 11.5 - 15.5 %   Platelets 312  150 - 400 K/uL   Neutrophils Relative % 69  43 - 77 %   Neutro Abs 4.1  1.7 - 7.7 K/uL   Lymphocytes Relative 19  12 - 46 %   Lymphs Abs 1.1  0.7 - 4.0 K/uL   Monocytes Relative 10  3 - 12 %    Monocytes Absolute 0.6  0.1 - 1.0 K/uL   Eosinophils Relative 2  0 - 5 %   Eosinophils Absolute 0.1  0.0 - 0.7 K/uL   Basophils Relative 0  0 - 1 %   Basophils Absolute 0.0  0.0 - 0.1 K/uL  COMPREHENSIVE METABOLIC PANEL     Status: Abnormal   Collection Time    07/01/13  9:39 AM      Result Value Ref Range   Sodium 136 (*) 137 - 147 mEq/L   Potassium 5.3  3.7 - 5.3 mEq/L   Chloride 97  96 - 112 mEq/L   CO2 27  19 - 32 mEq/L   Glucose, Bld 154 (*) 70 - 99 mg/dL   BUN 56 (*) 6 - 23 mg/dL   Creatinine, Ser 6.02 (*) 0.50 - 1.10 mg/dL   Calcium 8.1 (*) 8.4 - 10.5 mg/dL   Total Protein 5.9 (*) 6.0 - 8.3 g/dL   Albumin 1.9 (*) 3.5 - 5.2 g/dL   AST 18  0 - 37 U/L   ALT 18  0 - 35 U/L   Alkaline Phosphatase 78  39 - 117 U/L   Total Bilirubin 0.2 (*) 0.3 - 1.2 mg/dL   GFR calc non Af Amer 6 (*) >90 mL/min   GFR calc Af Amer 7 (*) >90 mL/min   Comment: (NOTE)     The eGFR has been calculated using the CKD EPI equation.     This calculation has not been validated in all clinical situations.     eGFR's persistently <90 mL/min signify possible Chronic Kidney     Disease.  APTT     Status: None   Collection Time    07/01/13  9:39 AM      Result Value Ref Range   aPTT 29  24 - 37 seconds  PROTIME-INR     Status: None   Collection Time    07/01/13  9:39 AM      Result Value Ref Range   Prothrombin Time 15.2  11.6 - 15.2 seconds   INR 1.23  0.00 - 1.49  PREPARE RBC (CROSSMATCH)     Status: None   Collection Time    07/01/13 10:35 AM      Result Value Ref Range   Order Confirmation ORDER PROCESSED BY BLOOD BANK    TYPE AND SCREEN     Status: None   Collection Time    07/01/13 10:38 AM      Result Value Ref Range   ABO/RH(D) B POS     Antibody Screen NEG     Sample Expiration 07/04/2013     Unit Number [id number]  Blood Component Type RED CELLS,LR     Unit division 00     Status of Unit ALLOCATED     Transfusion Status OK TO TRANSFUSE     Crossmatch Result Compatible      Unit Number [id number]     Blood Component Type RED CELLS,LR     Unit division 00     Status of Unit ISSUED     Transfusion Status OK TO TRANSFUSE     Crossmatch Result Compatible    GLUCOSE, CAPILLARY     Status: Abnormal   Collection Time    07/01/13  4:17 PM      Result Value Ref Range   Glucose-Capillary 115 (*) 70 - 99 mg/dL    No results found.  Review of Systems  Constitutional: Negative for weight loss.  Respiratory: Negative for shortness of breath.   Cardiovascular: Negative for orthopnea.  Gastrointestinal: Positive for blood in stool. Negative for nausea, vomiting and abdominal pain.  Neurological: Positive for weakness.   Blood pressure 116/44, pulse 77, temperature 98.1 F (36.7 C), temperature source Oral, resp. rate 24, height _0  (1.651 m), weight 68.04 kg (150 lb), SpO2 100.00%. Physical Exam  Constitutional: No distress.  Eyes: No scleral icterus.  Neck: No JVD present.  Cardiovascular: Normal rate and regular rhythm.   No murmur heard. Respiratory: She has no wheezes. She has no rales.  GI: She exhibits no distension. There is no tenderness. There is no rebound.  Musculoskeletal: She exhibits no edema.    Assessment/Plan: Problem #1 end-stage renal disease she status post hemodialysis on Tuesday. Patient is scheduled for dialysis today however she didn't go because of the GI problem. Presently she doesn't have any uremic sinus symptoms. Potassium is high normal. Problem #2 history of rectal bleeding: Patient had previous similar episode and have workup. The last colonoscopy was negative. Presently patient is seen by GI. Her hemoglobin and hematocrit is low and patient has received 1 unit of blood transfusion. Problem #3 hypertension her blood pressure is reasonably controlled Problem #4 diabetes Problem #5 a trial fibrillation: Her heart rate is controlled Problem #6 history of peritonitis: Patient presently on vancomycin and Ceftazidime after each  dialysis. Patient presently is a febrile, normal white blood cell count and no abdominal tenderness. Problem #7 metabolic bone disease her calcium is range but phosphorus is not available. Plan: We'll make arrangements for patient to get dialysis today We'll continue with 1 g of vancomycin and Ceptaz ceftazidime after dialysis. We'll check her CBC and phosphorus including basic metabolic panel in the morning. We'll give her an additional blood transfusion during dialysis.  Harriett Sine 07/01/2013, 4:37 PM

## 2013-07-01 NOTE — H&P (Signed)
Patient seen and examined. The above note reviewed.  She's been admitted to the hospital with melanotic stools. She recently underwent cardiac surgery and is on aspirin and Plavix. This will be held until source of GI bleeding can be determined. Continue with Protonix. Plans are for EGD tomorrow. She is being transfused PRBCs. We'll continue to monitor serial hemoglobins. She is a dialysis patient and is being followed by nephrology.  [name]

## 2013-07-01 NOTE — ED Notes (Signed)
Pt states she had 1 episode of blood in stool today, pt is a dialysis pt and has had to have blood transfusion for GI bleed at [facility] last year. Pt denies chest pain, SOB, N/V. Pt at [facility] center for rehab, has been a resident x1 week.

## 2013-07-01 NOTE — ED Notes (Signed)
Pt here from [facility] for evaluation of rectal bleed

## 2013-07-02 ENCOUNTER — Encounter (HOSPITAL_COMMUNITY): Payer: Self-pay | Admitting: *Deleted

## 2013-07-02 ENCOUNTER — Encounter (HOSPITAL_COMMUNITY)
Admission: EM | Disposition: A | Discharge status: Skilled Nursing Facility | Payer: Self-pay | Source: Home / Self Care | Attending: Internal Medicine

## 2013-07-02 DIAGNOSIS — K2990 Gastroduodenitis, unspecified, without bleeding: Secondary | ICD-10-CM

## 2013-07-02 DIAGNOSIS — K2970 Gastritis, unspecified, without bleeding: Secondary | ICD-10-CM

## 2013-07-02 DIAGNOSIS — K269 Duodenal ulcer, unspecified as acute or chronic, without hemorrhage or perforation: Secondary | ICD-10-CM

## 2013-07-02 DIAGNOSIS — K922 Gastrointestinal hemorrhage, unspecified: Secondary | ICD-10-CM

## 2013-07-02 HISTORY — PX: ESOPHAGOGASTRODUODENOSCOPY: SHX5428

## 2013-07-02 HISTORY — PX: GIVENS CAPSULE STUDY: SHX5432

## 2013-07-02 LAB — COMPREHENSIVE METABOLIC PANEL
ALK PHOS: 64 U/L (ref 39–117)
ALT: 17 U/L (ref 0–35)
AST: 20 U/L (ref 0–37)
Albumin: 1.9 g/dL — ABNORMAL LOW (ref 3.5–5.2)
BILIRUBIN TOTAL: 0.3 mg/dL (ref 0.3–1.2)
BUN: 24 mg/dL — AB (ref 6–23)
CHLORIDE: 102 meq/L (ref 96–112)
CO2: 31 meq/L (ref 19–32)
Calcium: 7.4 mg/dL — ABNORMAL LOW (ref 8.4–10.5)
Creatinine, Ser: 3.31 mg/dL — ABNORMAL HIGH (ref 0.50–1.10)
GFR, EST AFRICAN AMERICAN: 15 mL/min — AB (ref >90)
GFR, EST NON AFRICAN AMERICAN: 13 mL/min — AB (ref >90)
Glucose, Bld: 180 mg/dL — ABNORMAL HIGH (ref 70–99)
POTASSIUM: 4.5 meq/L (ref 3.7–5.3)
SODIUM: 141 meq/L (ref 137–147)
TOTAL PROTEIN: 5 g/dL — AB (ref 6.0–8.3)

## 2013-07-02 LAB — CBC
HEMATOCRIT: 28.3 % — AB (ref 36.0–46.0)
HEMATOCRIT: 31.3 % — AB (ref 36.0–46.0)
HEMATOCRIT: 27.9 % — AB (ref 36.0–46.0)
Hemoglobin: 9.4 g/dL — ABNORMAL LOW (ref 12.0–15.0)
Hemoglobin: 10.1 g/dL — ABNORMAL LOW (ref 12.0–15.0)
Hemoglobin: 9.1 g/dL — ABNORMAL LOW (ref 12.0–15.0)
MCH: 31.1 pg (ref 26.0–34.0)
MCH: 30.5 pg (ref 26.0–34.0)
MCH: 31 pg (ref 26.0–34.0)
MCHC: 33.2 g/dL (ref 30.0–36.0)
MCHC: 32.3 g/dL (ref 30.0–36.0)
MCHC: 32.6 g/dL (ref 30.0–36.0)
MCV: 93.7 fL (ref 78.0–100.0)
MCV: 94.6 fL (ref 78.0–100.0)
MCV: 94.9 fL (ref 78.0–100.0)
PLATELETS: 218 10*3/uL (ref 150–400)
PLATELETS: 252 10*3/uL (ref 150–400)
Platelets: 236 10*3/uL (ref 150–400)
RBC: 3.02 MIL/uL — ABNORMAL LOW (ref 3.87–5.11)
RBC: 3.31 MIL/uL — ABNORMAL LOW (ref 3.87–5.11)
RBC: 2.94 MIL/uL — ABNORMAL LOW (ref 3.87–5.11)
RDW: 20.3 % — AB (ref 11.5–15.5)
RDW: 20.7 % — ABNORMAL HIGH (ref 11.5–15.5)
RDW: 20.5 % — AB (ref 11.5–15.5)
WBC: 5.8 10*3/uL (ref 4.0–10.5)
WBC: 5.6 10*3/uL (ref 4.0–10.5)
WBC: 5.4 10*3/uL (ref 4.0–10.5)

## 2013-07-02 LAB — HEMOGLOBIN A1C
HEMOGLOBIN A1C: 5.6 % (ref <5.7)
Mean Plasma Glucose: 114 mg/dL (ref <117)

## 2013-07-02 LAB — GLUCOSE, CAPILLARY
GLUCOSE-CAPILLARY: 106 mg/dL — AB (ref 70–99)
GLUCOSE-CAPILLARY: 207 mg/dL — AB (ref 70–99)
Glucose-Capillary: 179 mg/dL — ABNORMAL HIGH (ref 70–99)
Glucose-Capillary: 193 mg/dL — ABNORMAL HIGH (ref 70–99)
Glucose-Capillary: 203 mg/dL — ABNORMAL HIGH (ref 70–99)
Glucose-Capillary: 187 mg/dL — ABNORMAL HIGH (ref 70–99)

## 2013-07-02 LAB — PHOSPHORUS: PHOSPHORUS: 4 mg/dL (ref 2.3–4.6)

## 2013-07-02 LAB — HEPATITIS B SURFACE ANTIGEN: Hepatitis B Surface Ag: NEGATIVE

## 2013-07-02 LAB — HEMOGLOBIN AND HEMATOCRIT, BLOOD
HCT: 29.7 % — ABNORMAL LOW (ref 36.0–46.0)
Hemoglobin: 9.7 g/dL — ABNORMAL LOW (ref 12.0–15.0)

## 2013-07-02 SURGERY — EGD (ESOPHAGOGASTRODUODENOSCOPY)
Anesthesia: Moderate Sedation

## 2013-07-02 MED ORDER — LIDOCAINE VISCOUS 2 % MT SOLN
OROMUCOSAL | Status: DC | PRN
  Administered 2013-07-02: 2 mL via OROMUCOSAL

## 2013-07-02 MED ORDER — FENTANYL CITRATE 0.05 MG/ML IJ SOLN
INTRAMUSCULAR | Status: AC
  Filled 2013-07-02: qty 4

## 2013-07-02 MED ORDER — NEPRO/CARBSTEADY PO LIQD: 237.0000 mL | ORAL | Status: DC | PRN

## 2013-07-02 MED ORDER — PANTOPRAZOLE SODIUM 40 MG PO TBEC
40.0000 mg | DELAYED_RELEASE_TABLET | Freq: Two times a day (BID) | ORAL | Status: DC
  Administered 2013-07-02: 40 mg via ORAL
  Filled 2013-07-02: qty 1

## 2013-07-02 MED ORDER — HEPARIN SODIUM (PORCINE) 1000 UNIT/ML DIALYSIS
1000.0000 [IU] | INTRAMUSCULAR | Status: DC | PRN
  Filled 2013-07-02: qty 1

## 2013-07-02 MED ORDER — SODIUM CHLORIDE 0.9 % IV SOLN: 100.0000 mL | INTRAVENOUS | Status: DC | PRN

## 2013-07-02 MED ORDER — PANTOPRAZOLE SODIUM 40 MG IV SOLR: 40.0000 mg | Freq: Two times a day (BID) | INTRAVENOUS | Status: DC

## 2013-07-02 MED ORDER — LIDOCAINE VISCOUS 2 % MT SOLN
OROMUCOSAL | Status: AC
  Filled 2013-07-02: qty 15

## 2013-07-02 MED ORDER — PENTAFLUOROPROP-TETRAFLUOROETH EX AERO
1.0000 "application " | INHALATION_SPRAY | CUTANEOUS | Status: DC | PRN
  Filled 2013-07-02: qty 103.5

## 2013-07-02 MED ORDER — SODIUM CHLORIDE 0.9 % IJ SOLN
INTRAMUSCULAR | Status: DC | PRN
  Administered 2013-07-02: 15:00:00

## 2013-07-02 MED ORDER — PANTOPRAZOLE SODIUM 40 MG IV SOLR: 40.0000 mg | Freq: Once | INTRAVENOUS | Status: DC

## 2013-07-02 MED ORDER — MIDAZOLAM HCL 5 MG/5ML IJ SOLN
INTRAMUSCULAR | Status: AC
  Filled 2013-07-02: qty 10

## 2013-07-02 MED ORDER — CEFTAZIDIME 1 G IJ SOLR
1.0000 g | INTRAMUSCULAR | Status: DC
Start: 2013-07-03 — End: 2013-07-02

## 2013-07-02 MED ORDER — SODIUM CHLORIDE 0.9 % IV SOLN
8.0000 mg/h | INTRAVENOUS | Status: DC
  Administered 2013-07-02 – 2013-07-05 (×6): 8 mg/h via INTRAVENOUS
  Filled 2013-07-02 (×7): qty 80

## 2013-07-02 MED ORDER — LIDOCAINE HCL (PF) 1 % IJ SOLN: 5.0000 mL | INTRAMUSCULAR | Status: DC | PRN

## 2013-07-02 MED ORDER — EPINEPHRINE HCL 0.1 MG/ML IJ SOSY
PREFILLED_SYRINGE | INTRAMUSCULAR | Status: AC
  Filled 2013-07-02: qty 10

## 2013-07-02 MED ORDER — MEPERIDINE HCL 100 MG/ML IJ SOLN
INTRAMUSCULAR | Status: AC
  Filled 2013-07-02: qty 2

## 2013-07-02 MED ORDER — VANCOMYCIN HCL IN DEXTROSE 1-5 GM/200ML-% IV SOLN
1000.0000 mg | INTRAVENOUS | Status: DC
  Administered 2013-07-03: 1000 mg via INTRAVENOUS
  Filled 2013-07-02 (×2): qty 200

## 2013-07-02 MED ORDER — ALTEPLASE 2 MG IJ SOLR
2.0000 mg | Freq: Once | INTRAMUSCULAR | Status: AC | PRN
  Filled 2013-07-02: qty 2

## 2013-07-02 MED ORDER — SODIUM CHLORIDE 0.9 % IV SOLN
80.0000 mg | Freq: Once | INTRAVENOUS | Status: AC
  Administered 2013-07-02: 80 mg via INTRAVENOUS
  Filled 2013-07-02: qty 80

## 2013-07-02 MED ORDER — DEXTROSE 5 % IV SOLN
2.0000 g | INTRAVENOUS | Status: DC
  Administered 2013-07-03: 2 g via INTRAVENOUS
  Filled 2013-07-02 (×2): qty 2

## 2013-07-02 MED ORDER — MIDAZOLAM HCL 5 MG/5ML IJ SOLN
INTRAMUSCULAR | Status: DC | PRN
  Administered 2013-07-02 (×3): 2 mg via INTRAVENOUS

## 2013-07-02 MED ORDER — SODIUM CHLORIDE 0.9 % IV SOLN: INTRAVENOUS | Status: DC

## 2013-07-02 MED ORDER — FENTANYL CITRATE 0.05 MG/ML IJ SOLN
INTRAMUSCULAR | Status: DC | PRN
  Administered 2013-07-02 (×3): 25 ug via INTRAVENOUS

## 2013-07-02 MED ORDER — LIDOCAINE-PRILOCAINE 2.5-2.5 % EX CREA: 1.0000 "application " | TOPICAL_CREAM | CUTANEOUS | Status: DC | PRN

## 2013-07-02 NOTE — Care Management Utilization Note (Signed)
UR completed 

## 2013-07-02 NOTE — Progress Notes (Signed)
Subjective: Interval History: has complaints rectal bleeding. Patient denies any nausea or vomiting. She does not have also any abdominal pain. According to patient she is to episode of GI bleeding through out the night..  Objective: Vital signs in last 24 hours: Temp:  [97.8 F (36.6 C)-98.4 F (36.9 C)] 98.4 F (36.9 C) (04/10 0435) Pulse Rate:  [64-84] 64 (04/10 0435) Resp:  [16-30] 20 (04/10 0435) BP: (78-140)/(34-67) 100/34 mmHg (04/10 0435) SpO2:  [96 %-100 %] 100 % (04/10 0435) Weight:  [68.04 kg (150 lb)-73.3 kg (161 lb 9.6 oz)] 73.1 kg (161 lb 2.5 oz) (04/09 2230) Weight change:   Intake/Output from previous day: 04/09 0701 - 04/10 0700 In: 1802.5 [P.O.:420; I.V.:712.5; Blood:670] Out: 130  Intake/Output this shift:    General appearance: alert, cooperative and no distress Resp: clear to auscultation bilaterally Cardio: regular rate and rhythm, S1, S2 normal, no murmur, click, rub or gallop GI: soft, non-tender; bowel sounds normal; no masses,  no organomegaly Extremities: extremities normal, atraumatic, no cyanosis or edema  Lab Results:  Recent Labs  07/01/13 1840 07/02/13 0009 07/02/13 0541  WBC 4.5  --  5.8  HGB 6.2* 9.7* 9.4*  HCT 19.1* 29.7* 28.3*  PLT 241  --  218   BMET:  Recent Labs  07/01/13 0939 07/02/13 0541  NA 136* 141  K 5.3 4.5  CL 97 102  CO2 27 31  GLUCOSE 154* 180*  BUN 56* 24*  CREATININE 6.02* 3.31*  CALCIUM 8.1* 7.4*   No results found for this basename: PTH,  in the last 72 hours Iron Studies: No results found for this basename: IRON, TIBC, TRANSFERRIN, FERRITIN,  in the last 72 hours  Studies/Results: No results found.  I have reviewed the patient's current medications.  Assessment/Plan: Problem #1 end-stage renal disease: She status post hemodialysis yesterday. She doesn't have any nausea vomiting. Her potassium is normal. Problem #2 GI bleeding. Presently patient has received blood transfusion. Her her and hematocrit  is stable. Problem #3 hypertension: Her blood pressure is under control Problem #4 history of diabetes Problem #5 metabolic bone disease: Calcium and phosphorus isn't range. Problem #6 hyperlipidemia. Problem #7 coronary disease Plan: We'll DC IV fluid With respect arrangements for patient to get dialysis tomorrow which is her regular schedule. We'll check her basic metabolic panel and CBC in the morning.   LOS: 1 day   Dimitri Shakespeare S Sokha Craker 07/02/2013,8:29 AM

## 2013-07-02 NOTE — Progress Notes (Signed)
[facility] PROGRESS NOTE  [name] UVO:[id number] DOB: 05-21-1945 DOA: 07/01/2013 PCP: Shirline Frees, MD   Summary: 68 year old woman admitted with acute blood loss anemia secondary to BRBPR. Hx includes ESRD on dialysis, recent CABG and mitral valve annuloplasty. GI and nephrology consulting.   Assessment/Plan: Principal Problem:  Acute blood loss anemia: In the setting of bright red blood per rectum in patient with history of anemia. Continues with marroon colored stool during night. S/p 3 units PRBC's. Hg 9. Will continue serial cbc's.  Currently she is hemodynamically stable. Evaluated by GI and scheduled for EGD and possible capsule study.  Will monitor closely  Active Problems:  GI bleeding: continues with 2 episodes maroon colored stool during night. Continue IV protonix. Evaluated by GI and EGD scheduled. Chart review indicates that patient underwent EGD in December of 2014 due to mucosal abnormality of the duodenum noted by Dr. Verlene Mayer in January 2014.. Ampullary diverticula noted. Also with history of colonic polyps per colonoscopy in 2013. Patient on Plavix will hold this   HYPERTENSION: stable but somewhat soft.  Home medications include amiodarone and coreg. Continuing amiodarone and holding coreg.  CAD, NATIVE VESSEL: No chest pain. In March of 2015 patient underwent 3 vessel CABG with mitral valve fair. Will monitor on telemetry. Hold aspirin for now do to #1 and #2   Chronic diastolic heart failure: Patient on amiodarone. Will continue this for now with parameters. Remains compensated.    ESRD (end stage renal disease): Patient is a Tuesday Thursday Saturday hemodialysis patient. Creatinine is currently greater than 6. Will request renal  Macrocytic anemia: History of same. Has had GI evaluation in the past with no explanation for chronic anemia. See #1.   Obesity, morbid: Nutritional consult   Type II or unspecified type diabetes mellitus with renal  manifestations, uncontrolled: Will obtain hemoglobin A1c. Will use sliding scale insulin for optimal control.   Purpura:  Code Status: full Family Communication: none present Disposition Plan: home when ready   Consultants:  GI  nephrology  Procedures:  EGD  Antibiotics:  none  HPI/Subjective: Awake alert. Denies pain/discomfort. Reports 2 episodes maroon stool during night  Objective: Filed Vitals:   07/02/13 0435  BP: 100/34  Pulse: 64  Temp: 98.4 F (36.9 C)  Resp: 20    Intake/Output Summary (Last 24 hours) at 07/02/13 0852 Last data filed at 07/02/13 0600  Gross per 24 hour  Intake 1802.5 ml  Output    130 ml  Net 1672.5 ml   Filed Weights   07/01/13 1645 07/01/13 1830 07/01/13 2230  Weight: 73.256 kg (161 lb 8 oz) 73.3 kg (161 lb 9.6 oz) 73.1 kg (161 lb 2.5 oz)    Exam:   General:  Obese appears comfortable  Cardiovascular: RRR No MGR No LE edema  Respiratory: normal effort BS clear bilaterally no wheeze  Abdomen: obese soft dialysis catheter intact +BS non-tender  Musculoskeletal: no clubbing or cyanosos  Skin: bilateral LE with purpura   Data Reviewed: Basic Metabolic Panel:  Recent Labs Lab 07/01/13 0939 07/02/13 0541  NA 136* 141  K 5.3 4.5  CL 97 102  CO2 27 31  GLUCOSE 154* 180*  BUN 56* 24*  CREATININE 6.02* 3.31*  CALCIUM 8.1* 7.4*  PHOS  --  4.0   Liver Function Tests:  Recent Labs Lab 07/01/13 0939 07/02/13 0541  AST 18 20  ALT 18 17  ALKPHOS 78 64  BILITOT 0.2* 0.3  PROT 5.9* 5.0*  ALBUMIN  1.9* 1.9*   No results found for this basename: LIPASE, AMYLASE,  in the last 168 hours No results found for this basename: AMMONIA,  in the last 168 hours CBC:  Recent Labs Lab 07/01/13 0939 07/01/13 1840 07/02/13 0009 07/02/13 0541  WBC 6.0 4.5  --  5.8  NEUTROABS 4.1  --   --   --   HGB 7.1* 6.2* 9.7* 9.4*  HCT 23.4* 19.1* 29.7* 28.3*  MCV 104.9* 97.0  --  93.7  PLT 312 241  --  218   Cardiac  Enzymes: No results found for this basename: CKTOTAL, CKMB, CKMBINDEX, TROPONINI,  in the last 168 hours BNP (last 3 results) No results found for this basename: PROBNP,  in the last 8760 hours CBG:  Recent Labs Lab 06/30/13 2100 07/01/13 0659 07/01/13 1617 07/01/13 2303 07/02/13 0830  GLUCAP 161* 128* 115* 106* 179*    No results found for this or any previous visit (from the past 240 hour(s)).   Studies: No results found.  Scheduled Meds: . amiodarone  200 mg Oral Daily  . [START ON 07/03/2013] cefTAZidime (FORTAZ)  IV  2 g Intravenous Q T,Th,Sa-HD  . insulin aspart  0-15 Units Subcutaneous TID WC  . insulin aspart  0-5 Units Subcutaneous QHS  . pantoprazole  40 mg Oral BID AC  . simvastatin  40 mg Oral QHS  . sodium chloride  3 mL Intravenous Q12H  . [START ON 07/03/2013] vancomycin  1,000 mg Intravenous Q T,Th,Sa-HD   Continuous Infusions:   Principal Problem:   Acute blood loss anemia Active Problems:   HYPERTENSION   CAD, NATIVE VESSEL   Chronic diastolic heart failure   ESRD (end stage renal disease)   Macrocytic anemia   Obesity, morbid   Type II or unspecified type diabetes mellitus with renal manifestations, uncontrolled   GI bleeding   GI bleed    Time spent: 30 minutes    [facility] Pager [phone]. If 7PM-7AM, please contact night-coverage at [email or web address], password [facility] 07/02/2013, 8:52 AM  LOS: 1 day

## 2013-07-02 NOTE — Progress Notes (Signed)
Patient seen and examined. Above note reviewed.  Patient underwent endoscopy today which indicated large duodenal ulcer. She is on a Protonix infusion. She's been transfused total of 3 units PRBCs thus far. Her last bowel movement did not appear to contain any blood. We will continue to monitor CBC. Plan will to eventually return to skilled nursing facility.  [facility]

## 2013-07-02 NOTE — Progress Notes (Signed)
Notified mid-level that patient had arrived to floor and had received a total of 3 units of blood.  Received orders for an H&H to be drawn at 00:00.  Will continue to monitor patient.

## 2013-07-02 NOTE — Op Note (Addendum)
[facility] [address] [place], [address]   ENDOSCOPY PROCEDURE REPORT  PATIENT: [name], [name]  MR#: [id number] BIRTHDATE: 1945/05/13 , 25  yrs. old GENDER: Female  ENDOSCOPIST: Barney Drain, MD REFERRED FG:HWEXHB Kenton Kingfisher, M.D.  Garfield Cornea, M.D.  PROCEDURE DATE: 07/02/2013 PROCEDURE:   EGD w/ biopsy and EGD w/ control of bleeding INDICATIONS:Melena. LAST TIME SHE SAW BLACK STOOL WAS THIS AM. MEDICATIONS: Fentanyl 75 mcg IV and Versed 6 mg IV TOPICAL ANESTHETIC:   Viscous Xylocaine  DESCRIPTION OF PROCEDURE:     Physical exam was performed.  Informed consent was obtained from the patient after explaining the benefits, risks, and alternatives to the procedure.  The patient was connected to the monitor and placed in the left lateral position.  Continuous oxygen was provided by nasal cannula and IV medicine administered through an indwelling cannula.  After administration of sedation, the patients esophagus was intubated and the EG-[id number] ([id number])  endoscope was advanced under direct visualization to the second portion of the duodenum.  The scope was removed slowly by carefully examining the color, texture, anatomy, and integrity of the mucosa on the way out.  The patient was recovered in endoscopy and discharged home in satisfactory condition.   ESOPHAGUS: The mucosa of the esophagus appeared normal.   A small hiatal hernia was noted.   STOMACH: Mild non-erosive gastritis (inflammation) was found in the gastric antrum.  Multiple biopsies were performed using cold forceps. CLEAN BASED PYLORIC CHANNEL ULCER.  DUODENUM: Multiple non-bleeding deep ulcers with surrounding edema and a visible vessel were found in the duodenal bulb and 2nd part of the duodenum. INJECTED 5 CC EPI AROUND LARGE ULCER BASE IN DUODENAL BULB. APPLIED CAUTERY(20w). CAUTERY SUCCESSFUL.  COMPLICATIONS:   None  ENDOSCOPIC IMPRESSION: 1.   GI BLEED DUE TO LARGE DUODENAL ULCER 2.    Small hiatal hernia 3.   PYLORIC CHANNEL ULCER 4.   Multiple CLEANED BASED ulcers in the duodenal bulb and 2nd part of the duodenum  RECOMMENDATIONS: HOLD ASA.  MY RESTART PLAVIX IN 7 DAYS. HOLD HEPARIN FOR 2 WEEKS ADVANCE DIET PROTONIX GTT FOR 72 HRS THEN PO BID. AWAIT BIOPSY. IF PT REBLEEDS, SHE WILL NEED SURGERY OR INTERVENTIONAL RADIOLOGY CONSULT. LESION WOULD NOT BE AMENABLE TO CLIPS AND ADDITIONAL CAUTERY MAY RESULT IN PERFORATION. REPEAT EGD IN 2-3MOS TO ASSES HEALING OF GASTRIC ULCER.   REPEAT EXAM:  _______________________________ Lorrin MaisBarney Drain, MD 07/02/2013 3:27 PM Revised: 07/02/2013 3:27 PM

## 2013-07-02 NOTE — Clinical Social Work Psychosocial (Signed)
Clinical Social Work Department BRIEF PSYCHOSOCIAL ASSESSMENT 07/02/2013  Patient:  [name], [name]     Account Number:  [id number]     Admit date:  07/01/2013  Clinical Social Worker:  Norina Buzzard Intern  Date/Time:  07/02/2013 08:35 AM  Referred by:  Physician  Date Referred:  07/02/2013 Referred for  Psychosocial assessment   Other Referral:   Interview type:  Patient Other interview type:    PSYCHOSOCIAL DATA Living Status:  FACILITY Admitted from facility:  [facility] Level of care:  [place] Primary support name:  Keturah Barre Primary support relationship to patient:  SPOUSE Degree of support available:   very supportive    CURRENT CONCERNS Current Concerns  Post-Acute Placement   Other Concerns:    SOCIAL WORK ASSESSMENT / PLAN Met with pt at bedside. Pt alert and oriented x4. Pt reports she has been at [facility] for less than [name] week. She was there for short term rehab after she had open heart surgery. She explains after surgery she became weak. Pt states she was seeing improvement, but feels this hospitalization is setting her back. Encouraged pt and provided support. She reports her husband, Keturah Barre is her best support. Prior to her surgery she was living at home with him and able to do everything on her own. Pt confirms her plan is to return to [facility] at d/c.  Spoke with [place] at facility. She confirms pt has been with them since last week, and is there for short term rehab.She states her husband is her best support. Per Tammi, no FL2 needed. She reports she is waiting to speak with the husband today about holding pt's bed. Tammi confirms pt is okay to return at d/c. CSW will continue to follow.   Assessment/plan status:  Psychosocial Support/Ongoing Assessment of Needs Other assessment/ plan:   Information/referral to community resources:    PATIENT'S/FAMILY'S RESPONSE TO PLAN OF CARE: Pt and facility agreeable to pt's return at d/c. CSW will  continue to follow.     Delfina Redwood BSW Intern

## 2013-07-02 NOTE — H&P (Signed)
Primary Care Physician:  Shirline Frees, MD Primary Gastroenterologist:  Dr. Oneida Alar  Pre-Procedure History & Physical: HPI:  [name] is a 68 y.o. female here for MELENA/brbpr/OBSCURE GI BLEED.  Past Medical History  Diagnosis Date  . Diabetes mellitus   . Hypertension   . Hyperlipidemia   . Coronary artery disease   . Dialysis patient     T/Th/[facility]-Dr Hinda Lenis, stops hemodialysis 11-28-2012, will do peritoneal dialysis  starting 12-01-2012   . Carpal tunnel syndrome   . Anemia   . Ovarian cancer 1995    [name]  . Colon polyps 01/2012    Per colonoscopy, Dr. Gala Romney  . CKD (chronic kidney disease)     peritoneal dialysis at home nightly.  . Transfusion history 03-04-13    Transfusion x1 unit a month ago-[facility]  . Stroke 2010    2010-"brief weakness,tongue thickness,incoherent"-no residual affects.  . Mitral regurgitation   . Myocardial infarction 2009  . Peritonitis 06/23/2013    Past Surgical History  Procedure Laterality Date  . Complete abdominal hysterectomy  1995  . Abdominal hysterectomy    . Colonoscopy with esophagogastroduodenoscopy (egd)  01/27/2012    Dr. Gala Romney. Small hiatal hernia, abnormal second portion of the duodenum with questionable extrinsic compression. Single left sided colonic diverticulum, 2 colon polyps. Hamartomatous colon polyp  . Eus  04/09/2012    Dr. Owens Loffler: Nonspecific edema/thickening of the periampullary duodenum. Unremarkable biopsy  . Tonsillectomy      child  . Peritoneal catheter insertion  10/30/2012    for peritoneal dialysis  . Back surgery  2009    x3 level fusion-Dr. Louanne Skye  . Cardiac catheterization      x2 stents-'09- Dr. Joellen Jersey  . Esophagogastroduodenoscopy (egd) with propofol N/A 03/11/2013    Dr. Owens Loffler, small duodenal diverticulum. Edematous duodenal mucosa with friability on biopsy (pathology negative)  . Three-vessel cabg, mitral valve annuloplasty, may ease   06/17/2013    [facility]  . Coronary artery bypass graft      Prior to Admission medications   Medication Sig Start Date End Date Taking? Authorizing Provider  ALPRAZolam Duanne Moron) 0.5 MG tablet Take one tablet by mouth at bedtime as needed for anxiety 06/28/13  Yes Tiffany L Reed, DO  Amino Acids-Protein Hydrolys (FEEDING SUPPLEMENT, PRO-STAT SUGAR FREE 64,) LIQD Take 30 mLs by mouth 2 (two) times daily between meals.   Yes Historical Provider, MD  amiodarone (PACERONE) 200 MG tablet Take 200 mg by mouth daily.   Yes Historical Provider, MD  aspirin EC 81 MG tablet Take 81 mg by mouth daily.   Yes Historical Provider, MD  b complex-vitamin c-folic acid (NEPHRO-VITE) 0.8 MG TABS tablet Take 0.8 mg by mouth every morning.   Yes Historical Provider, MD  carvedilol (COREG) 3.125 MG tablet Take 3.125 mg by mouth 2 (two) times daily with a meal.   Yes Historical Provider, MD  ciprofloxacin (CIPRO) 500 MG tablet Take 500 mg by mouth 2 (two) times daily. For 10 days. (06/30/13-07/10/13)   Yes Historical Provider, MD  clopidogrel (PLAVIX) 75 MG tablet Take 75 mg by mouth daily with breakfast.   Yes Historical Provider, MD  Insulin Isophane & Regular (HUMULIN 70/30 [place]) Inject 15-20 Units into the skin 2 (two) times daily. 20 units in the morning   15 units at night   Yes Historical Provider, MD  meclizine (ANTIVERT) 25 MG tablet Take 50 mg by mouth 2 (two) times daily as needed for dizziness.   Yes  Historical Provider, MD  pantoprazole (PROTONIX) 40 MG tablet Take 40 mg by mouth every evening.   Yes Historical Provider, MD  simvastatin (ZOCOR) 40 MG tablet Take 40 mg by mouth at bedtime.   Yes Historical Provider, MD    Allergies as of 07/01/2013 - Review Complete 07/01/2013  Allergen Reaction Noted  . Lisinopril Swelling 08/30/2012    Family History  Problem Relation Age of Onset  . Diabetes Mother     History   Social History  . Marital Status: Married    Spouse Name: N/A    Number of Children: 3   . Years of Education: N/A   Occupational History  . RETIRED    Social History Main Topics  . Smoking status: Former Smoker -- 0.50 packs/day for 15 years  . Smokeless tobacco: Never Used  . Alcohol Use: No  . Drug Use: No  . Sexual Activity: No   Other Topics Concern  . Not on file   Social History Narrative   3 grown healthy children   Lives w/ husband   Has 8 grandkids    Review of Systems: See HPI, otherwise negative ROS   Physical Exam: BP 128/53  Pulse 84  Temp(Src) 98 F (36.7 C) (Oral)  Resp 16  Ht 5\' 5"  (1.651 m)  Wt 161 lb 2.5 oz (73.1 kg)  BMI 26.82 kg/m2  SpO2 99% General:   Alert,  pleasant and cooperative in NAD Head:  Normocephalic and atraumatic. Neck:  Supple; Lungs:  Clear throughout to auscultation.    Heart:  Regular rate and rhythm. Abdomen:  Soft, nontender and nondistended. Normal bowel sounds, without guarding, and without rebound.   Neurologic:  Alert and  oriented x4;  grossly normal neurologically.  Impression/Plan:     MELENA/brbpr  PLAN: 1. EGD/?capsule placement TODAY

## 2013-07-02 NOTE — Care Management Note (Addendum)
    Page 1 of 1   07/05/2013     1:52:19 PM   CARE MANAGEMENT NOTE 07/05/2013  Patient:  [name]   Account Number:  [id number]  Date Initiated:  07/02/2013  Documentation initiated by:  Claretha Cooper  Subjective/Objective Assessment:   Pt from [place] and anticipated DC back when medically clear. CSW assisting with DC.     Action/Plan:   Anticipated DC Date:     Anticipated DC Plan:  [place]  CM consult      Choice offered to / List presented to:             Status of service:  Completed, signed off Medicare Important Message given?  YES (If response is "NO", the following Medicare IM given date fields will be blank) Date Medicare IM given:  07/05/2013 Date Additional Medicare IM given:    Discharge Disposition:  [place]  Per UR Regulation:    If discussed at Long Length of Stay Meetings, dates discussed:    Comments:  07/02/13 Claretha Cooper RN BSN CM

## 2013-07-02 NOTE — Progress Notes (Signed)
Notified dialysis nurse of critical HgB.  Confirmed with dialysis nurse that she would infuse units of blood while patient was in dialysis.

## 2013-07-03 DIAGNOSIS — K274 Chronic or unspecified peptic ulcer, site unspecified, with hemorrhage: Secondary | ICD-10-CM

## 2013-07-03 DIAGNOSIS — E119 Type 2 diabetes mellitus without complications: Secondary | ICD-10-CM

## 2013-07-03 LAB — GLUCOSE, CAPILLARY
GLUCOSE-CAPILLARY: 154 mg/dL — AB (ref 70–99)
GLUCOSE-CAPILLARY: 181 mg/dL — AB (ref 70–99)
Glucose-Capillary: 181 mg/dL — ABNORMAL HIGH (ref 70–99)
Glucose-Capillary: 208 mg/dL — ABNORMAL HIGH (ref 70–99)

## 2013-07-03 LAB — BASIC METABOLIC PANEL
BUN: 30 mg/dL — AB (ref 6–23)
CHLORIDE: 100 meq/L (ref 96–112)
CO2: 26 meq/L (ref 19–32)
CREATININE: 4.9 mg/dL — AB (ref 0.50–1.10)
Calcium: 7.9 mg/dL — ABNORMAL LOW (ref 8.4–10.5)
GFR calc Af Amer: 10 mL/min — ABNORMAL LOW (ref >90)
GFR calc non Af Amer: 8 mL/min — ABNORMAL LOW (ref >90)
GLUCOSE: 182 mg/dL — AB (ref 70–99)
Potassium: 4.3 mEq/L (ref 3.7–5.3)
Sodium: 140 mEq/L (ref 137–147)

## 2013-07-03 LAB — CBC
HEMATOCRIT: 26.3 % — AB (ref 36.0–46.0)
HEMOGLOBIN: 8.4 g/dL — AB (ref 12.0–15.0)
MCH: 30.4 pg (ref 26.0–34.0)
MCHC: 31.9 g/dL (ref 30.0–36.0)
MCV: 95.3 fL (ref 78.0–100.0)
Platelets: 244 10*3/uL (ref 150–400)
RBC: 2.76 MIL/uL — AB (ref 3.87–5.11)
RDW: 20.4 % — ABNORMAL HIGH (ref 11.5–15.5)
WBC: 4.4 10*3/uL (ref 4.0–10.5)

## 2013-07-03 MED ORDER — EPOETIN ALFA 10000 UNIT/ML IJ SOLN
10000.0000 [IU] | INTRAMUSCULAR | Status: DC
  Administered 2013-07-03: 10000 [IU] via INTRAVENOUS
  Filled 2013-07-03 (×2): qty 1

## 2013-07-03 NOTE — Progress Notes (Signed)
Subjective: Interval History: Seen on dialysis and states that she does not have any more bleeding after the EGD yesterday. No nausea or vomiting and no abdominal pain. Feels hungry and wants to eat.  Objective: Vital signs in last 24 hours: Temp:  [98 F (36.7 C)-98.1 F (36.7 C)] 98.1 F (36.7 C) (04/11 0645) Pulse Rate:  [70-126] 73 (04/11 0730) Resp:  [16-27] 16 (04/11 0645) BP: (64-142)/(37-78) 111/54 mmHg (04/11 0730) SpO2:  [85 %-100 %] 99 % (04/11 0645) Weight change:   Intake/Output from previous day:   Intake/Output this shift:    General appearance: alert, cooperative and no distress Resp: clear to auscultation bilaterally Cardio: regular rate and rhythm, S1, S2 normal, no murmur, click, rub or gallop GI: soft, non-tender; bowel sounds normal; no masses,  no organomegaly Extremities: extremities normal, atraumatic, no cyanosis or edema  Lab Results:  Recent Labs  07/02/13 2020 07/03/13 0445  WBC 5.4 4.4  HGB 9.1* 8.4*  HCT 27.9* 26.3*  PLT 236 244   BMET:   Recent Labs  07/02/13 0541 07/03/13 0445  NA 141 140  K 4.5 4.3  CL 102 100  CO2 31 26  GLUCOSE 180* 182*  BUN 24* 30*  CREATININE 3.31* 4.90*  CALCIUM 7.4* 7.9*   No results found for this basename: PTH,  in the last 72 hours Iron Studies: No results found for this basename: IRON, TIBC, TRANSFERRIN, FERRITIN,  in the last 72 hours  Studies/Results: No results found.  I have reviewed the patient's current medications.  Assessment/Plan: Problem #1 end-stage renal disease: She status post hemodialysis yesterday. She doesn't have any nausea vomiting. Her potassium is normal. Problem #2 GI bleeding. Presently patient has received blood transfusion. Her her and hematocrit is declining.Patient was bleeding from stomach ulcer Problem #3 hypertension: Her blood pressure is under control Problem #4 history of diabetes Problem #5 metabolic bone disease: Calcium and phosphorus is in range. Problem  #6 hyperlipidemia. Problem #7 coronary disease Plan: Epogen 10,000 units iv after each dialysis We'll check her basic metabolic panel and CBC in the morning.   LOS: 2 days   [name] [name] 07/03/2013,7:50 AM

## 2013-07-03 NOTE — Progress Notes (Signed)
[facility] PROGRESS NOTE  [name] ZOX:[id number] DOB: 1946-01-08 DOA: 07/01/2013 PCP: Shirline Frees, MD   Summary: 68 year old woman admitted with acute blood loss anemia secondary to BRBPR. Hx includes ESRD on dialysis, recent CABG and mitral valve annuloplasty. GI and nephrology consulting.   Assessment/Plan: Principal Problem:  Acute blood loss anemia: Secondary to GI blood loss. S/p 3 units PRBC's. Continue to follow hemoglobin.  Currently she is hemodynamically stable. Will monitor closely  Active Problems:  GI bleeding: Appears to have resolved. Patient had EGD on 4/10 with results as below. No further bleeding. Continue on Protonix infusion. Aspirin Plavix arecurrently on hold.  HYPERTENSION: stable but somewhat soft.  Home medications include amiodarone and coreg. Continuing amiodarone and holding coreg.  CAD, NATIVE VESSEL: No chest pain. In March of 2015 patient underwent 3 vessel CABG with mitral valve fair. Will monitor on telemetry. Hold aspirin for now do to #1 and #2   Chronic diastolic heart failure: Patient on amiodarone. Will continue this for now with parameters. Remains compensated.    ESRD (end stage renal disease): Patient is a Tuesday Thursday Saturday hemodialysis patient. Creatinine is currently greater than 6. Will request renal  Macrocytic anemia: History of same. Has had GI evaluation in the past with no explanation for chronic anemia. See #1.   Obesity, morbid: Nutritional consult   Type II or unspecified type diabetes mellitus with renal manifestations, uncontrolled: Will obtain hemoglobin A1c. Will use sliding scale insulin for optimal control.     Code Status: full Family Communication: none present Disposition Plan: return to SNF   Consultants:  GI  nephrology  Procedures: EGD: 1. GI BLEED DUE TO LARGE DUODENAL ULCER  2. Small hiatal hernia  3. PYLORIC CHANNEL ULCER  4. Multiple CLEANED BASED ulcers in the duodenal bulb and  2nd part  of the duodenum    Antibiotics:  none  HPI/Subjective: No further bloody bowel movements.  No new complaints  Objective: Filed Vitals:   07/03/13 1456  BP: 107/60  Pulse: 86  Temp: 98 F (36.7 C)  Resp: 18    Intake/Output Summary (Last 24 hours) at 07/03/13 1635 Last data filed at 07/03/13 1029  Gross per 24 hour  Intake      0 ml  Output   2195 ml  Net  -2195 ml   Filed Weights   07/01/13 1645 07/01/13 1830 07/01/13 2230  Weight: 73.256 kg (161 lb 8 oz) 73.3 kg (161 lb 9.6 oz) 73.1 kg (161 lb 2.5 oz)    Exam:   General:  Obese appears comfortable  Cardiovascular: RRR No MGR No LE edema  Respiratory: normal effort BS clear bilaterally no wheeze  Abdomen: obese soft dialysis catheter intact +BS non-tender  Musculoskeletal: no clubbing or cyanosos   Data Reviewed: Basic Metabolic Panel:  Recent Labs Lab 07/01/13 0939 07/02/13 0541 07/03/13 0445  NA 136* 141 140  K 5.3 4.5 4.3  CL 97 102 100  CO2 27 31 26   GLUCOSE 154* 180* 182*  BUN 56* 24* 30*  CREATININE 6.02* 3.31* 4.90*  CALCIUM 8.1* 7.4* 7.9*  PHOS  --  4.0  --    Liver Function Tests:  Recent Labs Lab 07/01/13 0939 07/02/13 0541  AST 18 20  ALT 18 17  ALKPHOS 78 64  BILITOT 0.2* 0.3  PROT 5.9* 5.0*  ALBUMIN 1.9* 1.9*   No results found for this basename: LIPASE, AMYLASE,  in the last 168 hours No results found for this  basename: AMMONIA,  in the last 168 hours CBC:  Recent Labs Lab 07/01/13 0939 07/01/13 1840 07/02/13 0009 07/02/13 0541 07/02/13 1154 07/02/13 2020 07/03/13 0445  WBC 6.0 4.5  --  5.8 5.6 5.4 4.4  NEUTROABS 4.1  --   --   --   --   --   --   HGB 7.1* 6.2* 9.7* 9.4* 10.1* 9.1* 8.4*  HCT 23.4* 19.1* 29.7* 28.3* 31.3* 27.9* 26.3*  MCV 104.9* 97.0  --  93.7 94.6 94.9 95.3  PLT 312 241  --  218 252 236 244   Cardiac Enzymes: No results found for this basename: CKTOTAL, CKMB, CKMBINDEX, TROPONINI,  in the last 168 hours BNP (last 3  results) No results found for this basename: PROBNP,  in the last 8760 hours CBG:  Recent Labs Lab 07/02/13 1111 07/02/13 1409 07/02/13 1648 07/02/13 2207 07/03/13 1120  GLUCAP 193* 203* 187* 207* 154*    No results found for this or any previous visit (from the past 240 hour(s)).   Studies: No results found.  Scheduled Meds: . amiodarone  200 mg Oral Daily  . cefTAZidime (FORTAZ)  IV  2 g Intravenous Q T,Th,Sa-HD  . epoetin alfa  10,000 Units Intravenous Q T,Th,Sa-HD  . insulin aspart  0-15 Units Subcutaneous TID WC  . insulin aspart  0-5 Units Subcutaneous QHS  . [START ON 07/06/2013] pantoprazole (PROTONIX) IV  40 mg Intravenous Q12H  . simvastatin  40 mg Oral QHS  . sodium chloride  3 mL Intravenous Q12H  . vancomycin  1,000 mg Intravenous Q T,Th,Sa-HD   Continuous Infusions: . pantoprozole (PROTONIX) infusion 8 mg/hr (07/03/13 1212)    Principal Problem:   Acute blood loss anemia Active Problems:   HYPERTENSION   CAD, NATIVE VESSEL   Chronic diastolic heart failure   ESRD (end stage renal disease)   Macrocytic anemia   Obesity, morbid   Type II or unspecified type diabetes mellitus with renal manifestations, uncontrolled   GI bleeding   GI bleed    Time spent: 30 minutes    [place] [facility] Pager [phone]. If 7PM-7AM, please contact night-coverage at [email or web address], password [facility] 07/03/2013, 4:35 PM  LOS: 2 days

## 2013-07-03 NOTE — Procedures (Signed)
   HEMODIALYSIS TREATMENT NOTE:  3.5 hour heparin-free dialysis completed via right upper arm AVF (16g/antegrade).  Goal met:  Tolerated removal of 2.2 liters without interruption in ultrafiltration.  All blood was reinfused and hemostasis was achieved within 10 minutes.  Pt received Vancomycin, Tressie Ellis and Epogen with HD.  Report given to Berneice Gandy, RN.  Rockwell Alexandria, RN, CDN

## 2013-07-03 NOTE — Progress Notes (Signed)
07/03/13 1020 Patient CBG: 208 tonight. Pt stated would prefer not to receive insulin per HS scale, due to "I don't want my sugar to drop during the night, it does sometimes". Donavan Foil, RN

## 2013-07-03 NOTE — Plan of Care (Signed)
Problem: Phase III Progression Outcomes Goal: Activity at appropriate level-compared to baseline (UP IN CHAIR FOR HEMODIALYSIS)  Outcome: Progressing 07/03/13 2311 Patient up to chair this evening, ambulates in room. Assist as needed, tolerates well. Husband at bedside assisting with care, instructed to call for assistance as needed. Donavan Foil, RN

## 2013-07-03 NOTE — Progress Notes (Addendum)
Patient seen in hemodialysis. No complaints. One brown stool overnight. EGD findings reviewed  Vital signs in last 24 hours: Temp:  [98 F (36.7 C)-98.1 F (36.7 C)] 98 F (36.7 C) (04/11 1045) Pulse Rate:  [70-126] 76 (04/11 1045) Resp:  [16-27] 18 (04/11 1045) BP: (64-142)/(37-78) 117/54 mmHg (04/11 1045) SpO2:  [85 %-100 %] 99 % (04/11 0645) Last BM Date: 07/02/13 General:   Alert,  conversant appears in no acute distress. Abdomen:  Nondistended positive bowel sounds. Soft and non-tender  Extremities:  Without clubbing or edema.    Intake/Output from previous day:   Intake/Output this shift: Total I/O In: -  Out: 2195 [Other:2195]  Lab Results:  Recent Labs  07/02/13 1154 07/02/13 2020 07/03/13 0445  WBC 5.6 5.4 4.4  HGB 10.1* 9.1* 8.4*  HCT 31.3* 27.9* 26.3*  PLT 252 236 244   BMET  Recent Labs  07/01/13 0939 07/02/13 0541 07/03/13 0445  NA 136* 141 140  K 5.3 4.5 4.3  CL 97 102 100  CO2 27 31 26   GLUCOSE 154* 180* 182*  BUN 56* 24* 30*  CREATININE 6.02* 3.31* 4.90*  CALCIUM 8.1* 7.4* 7.9*   LFT  Recent Labs  07/02/13 0541  PROT 5.0*  ALBUMIN 1.9*  AST 20  ALT 17  ALKPHOS 64  BILITOT 0.3   PT/INR  Recent Labs  07/01/13 0939 07/01/13 1840  LABPROT 15.2 15.5*  INR 1.23 1.26     Impression: Upper GI bleed secondary to duodenal bulbar ulcer disease requiring therapeutic intervention yesterday. Clinically, doing well. Downward drift in hemoglobin to 8.4 from 9.1-likely reflects equilibration.   Recommendations:  Follow hemoglobin. PPI infusion another 48 hours and then twice a day orally. Followup on biopsies and serum gastrin level.  Avoid heparin x7 days as feasible.

## 2013-07-04 LAB — CBC
HCT: 27.3 % — ABNORMAL LOW (ref 36.0–46.0)
Hemoglobin: 8.8 g/dL — ABNORMAL LOW (ref 12.0–15.0)
MCH: 31.5 pg (ref 26.0–34.0)
MCHC: 32.2 g/dL (ref 30.0–36.0)
MCV: 97.8 fL (ref 78.0–100.0)
Platelets: 224 10*3/uL (ref 150–400)
RBC: 2.79 MIL/uL — ABNORMAL LOW (ref 3.87–5.11)
RDW: 20.5 % — AB (ref 11.5–15.5)
WBC: 4.1 10*3/uL (ref 4.0–10.5)

## 2013-07-04 LAB — GLUCOSE, CAPILLARY
GLUCOSE-CAPILLARY: 198 mg/dL — AB (ref 70–99)
GLUCOSE-CAPILLARY: 263 mg/dL — AB (ref 70–99)
Glucose-Capillary: 212 mg/dL — ABNORMAL HIGH (ref 70–99)
Glucose-Capillary: 176 mg/dL — ABNORMAL HIGH (ref 70–99)

## 2013-07-04 LAB — BASIC METABOLIC PANEL
BUN: 13 mg/dL (ref 6–23)
CALCIUM: 7.8 mg/dL — AB (ref 8.4–10.5)
CO2: 31 mEq/L (ref 19–32)
Chloride: 99 mEq/L (ref 96–112)
Creatinine, Ser: 3.74 mg/dL — ABNORMAL HIGH (ref 0.50–1.10)
GFR, EST AFRICAN AMERICAN: 13 mL/min — AB (ref >90)
GFR, EST NON AFRICAN AMERICAN: 11 mL/min — AB (ref >90)
GLUCOSE: 200 mg/dL — AB (ref 70–99)
Potassium: 3.9 mEq/L (ref 3.7–5.3)
SODIUM: 139 meq/L (ref 137–147)

## 2013-07-04 MED ORDER — PANTOPRAZOLE SODIUM 40 MG IV SOLR
INTRAVENOUS | Status: AC
  Filled 2013-07-04: qty 80

## 2013-07-04 MED ORDER — ALPRAZOLAM 1 MG PO TABS
1.0000 mg | ORAL_TABLET | Freq: Once | ORAL | Status: AC
Start: 2013-07-04 — End: 2013-07-04
  Administered 2013-07-04: 1 mg via ORAL
  Filled 2013-07-04: qty 1

## 2013-07-04 NOTE — Progress Notes (Signed)
Patient reports doing well. Tolerating a regular diet. No nausea vomiting or abdominal pain. Has a normal appearing brown bowel movement the past 24 hours.   Vital signs in last 24 hours: Temp:  [98 F (36.7 C)-98.5 F (36.9 C)] 98.5 F (36.9 C) (04/12 0644) Pulse Rate:  [74-86] 74 (04/12 0644) Resp:  [16-18] 18 (04/12 0644) BP: (104-118)/(44-60) 118/44 mmHg (04/12 0644) SpO2:  [92 %-100 %] 100 % (04/12 0644) Weight:  [159 lb 11.2 oz (72.439 kg)] 159 lb 11.2 oz (72.439 kg) (04/12 0644) Last BM Date: 07/02/13 General:   Alert, comfortable, pleasant lady in no acute distress. Abdomen:  Positive bowel sounds soft and nontender without appreciable mass Extremities:  Without clubbing or edema.    Intake/Output from previous day: 04/11 0701 - 04/12 0700 In: 968.8 [P.O.:720; I.V.:248.8] Out: 2195  Intake/Output this shift:    Lab Results:  Recent Labs  07/02/13 2020 07/03/13 0445 07/04/13 0427  WBC 5.4 4.4 4.1  HGB 9.1* 8.4* 8.8*  HCT 27.9* 26.3* 27.3*  PLT 236 244 224   BMET  Recent Labs  07/02/13 0541 07/03/13 0445 07/04/13 0427  NA 141 140 139  K 4.5 4.3 3.9  CL 102 100 99  CO2 31 26 31   GLUCOSE 180* 182* 200*  BUN 24* 30* 13  CREATININE 3.31* 4.90* 3.74*  CALCIUM 7.4* 7.9* 7.8*   LFT  Recent Labs  07/02/13 0541  PROT 5.0*  ALBUMIN 1.9*  AST 20  ALT 17  ALKPHOS 64  BILITOT 0.3   PT/INR  Recent Labs  07/01/13 1840  LABPROT 15.5*  INR 1.26   Hepatitis Panel  Recent Labs  07/01/13 1840  HEPBSAG NEGATIVE    Impression:   Pleasant 68 year old lady with upper GI bleeding secondary to peptic ulcer disease. Bleeding has ceased. Serum gastrin level, biopsies pending.   Recommendations:   Continue IV PPI infusion until tomorrow then may be switched over to twice a day PPI therapy. This therapy should be continued until she has a followup EGD in 12 weeks.  No NSAIDs. Followup on serum gastrin level, biopsies. From a GI standpoint, if doing well  tomorrow, probably be discharged later in the day per hospitalist service

## 2013-07-04 NOTE — Progress Notes (Signed)
07/04/13 0056 Nursing called to patient's room. Stated "I think I need my oxygen". C/o "short of breath" when lying down. Pt repositioned for comfort, O2 applied at 2 lpm. Stated she felt better with O2 in place. Instructed to notify nursing of any further shortness of breath or discomfort. Call light within reach. Donavan Foil, RN

## 2013-07-04 NOTE — Progress Notes (Signed)
[facility] PROGRESS NOTE  [name] [id number]:[id number] DOB: Sep 26, 1945 DOA: 07/01/2013 PCP: Shirline Frees, MD   Summary: 68 year old woman admitted with acute blood loss anemia secondary to BRBPR. Hx includes ESRD on dialysis, recent CABG and mitral valve annuloplasty. GI and nephrology consulting.   Assessment/Plan: Principal Problem:  Acute blood loss anemia: Secondary to GI blood loss. S/p 3 units PRBC's. Continue to follow hemoglobin.  Currently she is hemodynamically stable. Will monitor closely  Active Problems:  GI bleeding: Appears to have resolved. Patient had EGD on 4/10 with results as below. No further bleeding. Continue on Protonix infusion. Aspirin Plavix arecurrently on hold.  HYPERTENSION: stable but somewhat soft.  Home medications include amiodarone and coreg. Continuing amiodarone and holding coreg.  CAD, NATIVE VESSEL: No chest pain. In March of 2015 patient underwent 3 vessel CABG with mitral valve fair. Will monitor on telemetry. Hold aspirin/plavix for now do to #1 and #2. Will need to discuss with GI when it will be reasonable to resume these agents   Chronic diastolic heart failure: Patient on amiodarone. Will continue this for now with parameters. Remains compensated.    ESRD (end stage renal disease): Patient is a Tuesday Thursday Saturday hemodialysis patient. Nephrology following.  Macrocytic anemia: History of same. Has had GI evaluation in the past with no explanation for chronic anemia. See #1.   Obesity, morbid: Nutritional consult   Type II or unspecified type diabetes mellitus with renal manifestations, uncontrolled: Will obtain hemoglobin A1c. Will use sliding scale insulin for optimal control.     Code Status: full Family Communication: none present Disposition Plan: return to SNF   Consultants:  GI  nephrology  Procedures: EGD: 1. GI BLEED DUE TO LARGE DUODENAL ULCER  2. Small hiatal hernia  3. PYLORIC CHANNEL ULCER  4.  Multiple CLEANED BASED ulcers in the duodenal bulb and 2nd part  of the duodenum    Antibiotics:  none  HPI/Subjective: No new complaints  Objective: Filed Vitals:   07/04/13 1530  BP: 114/40  Pulse: 70  Temp: 98 F (36.7 C)  Resp: 18    Intake/Output Summary (Last 24 hours) at 07/04/13 1940 Last data filed at 07/04/13 1700  Gross per 24 hour  Intake 968.75 ml  Output      0 ml  Net 968.75 ml   Filed Weights   07/01/13 1830 07/01/13 2230 07/04/13 0644  Weight: 73.3 kg (161 lb 9.6 oz) 73.1 kg (161 lb 2.5 oz) 72.439 kg (159 lb 11.2 oz)    Exam:   General:  Obese appears comfortable  Cardiovascular: RRR No MGR No LE edema  Respiratory: normal effort BS clear bilaterally no wheeze  Abdomen: obese soft dialysis catheter intact +BS non-tender  Musculoskeletal: no clubbing or cyanosos   Data Reviewed: Basic Metabolic Panel:  Recent Labs Lab 07/01/13 0939 07/02/13 0541 07/03/13 0445 07/04/13 0427  NA 136* 141 140 139  K 5.3 4.5 4.3 3.9  CL 97 102 100 99  CO2 27 31 26 31   GLUCOSE 154* 180* 182* 200*  BUN 56* 24* 30* 13  CREATININE 6.02* 3.31* 4.90* 3.74*  CALCIUM 8.1* 7.4* 7.9* 7.8*  PHOS  --  4.0  --   --    Liver Function Tests:  Recent Labs Lab 07/01/13 0939 07/02/13 0541  AST 18 20  ALT 18 17  ALKPHOS 78 64  BILITOT 0.2* 0.3  PROT 5.9* 5.0*  ALBUMIN 1.9* 1.9*   No results found for this basename: LIPASE,  AMYLASE,  in the last 168 hours No results found for this basename: AMMONIA,  in the last 168 hours CBC:  Recent Labs Lab 07/01/13 0939  07/02/13 0541 07/02/13 1154 07/02/13 2020 07/03/13 0445 07/04/13 0427  WBC 6.0  < > 5.8 5.6 5.4 4.4 4.1  NEUTROABS 4.1  --   --   --   --   --   --   HGB 7.1*  < > 9.4* 10.1* 9.1* 8.4* 8.8*  HCT 23.4*  < > 28.3* 31.3* 27.9* 26.3* 27.3*  MCV 104.9*  < > 93.7 94.6 94.9 95.3 97.8  PLT 312  < > 218 252 236 244 224  < > = values in this interval not displayed. Cardiac Enzymes: No results found  for this basename: CKTOTAL, CKMB, CKMBINDEX, TROPONINI,  in the last 168 hours BNP (last 3 results) No results found for this basename: PROBNP,  in the last 8760 hours CBG:  Recent Labs Lab 07/03/13 1929 07/03/13 2218 07/04/13 0745 07/04/13 1112 07/04/13 1613  GLUCAP 181* 208* 212* 176* 198*    No results found for this or any previous visit (from the past 240 hour(s)).   Studies: No results found.  Scheduled Meds: . amiodarone  200 mg Oral Daily  . cefTAZidime (FORTAZ)  IV  2 g Intravenous Q T,Th,Sa-HD  . epoetin alfa  10,000 Units Intravenous Q T,Th,Sa-HD  . insulin aspart  0-15 Units Subcutaneous TID WC  . insulin aspart  0-5 Units Subcutaneous QHS  . [START ON 07/06/2013] pantoprazole (PROTONIX) IV  40 mg Intravenous Q12H  . simvastatin  40 mg Oral QHS  . sodium chloride  3 mL Intravenous Q12H  . vancomycin  1,000 mg Intravenous Q T,Th,Sa-HD   Continuous Infusions: . pantoprozole (PROTONIX) infusion 8 mg/hr (07/04/13 1037)    Principal Problem:   Acute blood loss anemia Active Problems:   HYPERTENSION   CAD, NATIVE VESSEL   Chronic diastolic heart failure   ESRD (end stage renal disease)   Macrocytic anemia   Obesity, morbid   Type II or unspecified type diabetes mellitus with renal manifestations, uncontrolled   GI bleeding   GI bleed    Time spent: 30 minutes    [facility] Pager [phone]. If 7PM-7AM, please contact night-coverage at [email or web address], password [facility] 07/04/2013, 7:40 PM  LOS: 3 days

## 2013-07-04 NOTE — Progress Notes (Signed)
Subjective: Interval History: No blood in the stool. No nausea or vomiting and no abdominal pain. Feels hungry and wants to eat.  Objective: Vital signs in last 24 hours: Temp:  [98 F (36.7 C)-98.5 F (36.9 C)] 98.5 F (36.9 C) (04/12 0644) Pulse Rate:  [70-86] 74 (04/12 0644) Resp:  [16-18] 18 (04/12 0644) BP: (97-118)/(44-60) 118/44 mmHg (04/12 0644) SpO2:  [92 %-100 %] 100 % (04/12 0644) Weight:  [72.439 kg (159 lb 11.2 oz)] 72.439 kg (159 lb 11.2 oz) (04/12 0644) Weight change:   Intake/Output from previous day: 04/11 0701 - 04/12 0700 In: 968.8 [P.O.:720; I.V.:248.8] Out: 2195  Intake/Output this shift:    General appearance: alert, cooperative and no distress Resp: clear to auscultation bilaterally Cardio: regular rate and rhythm, S1, S2 normal, no murmur, click, rub or gallop GI: soft, non-tender; bowel sounds normal; no masses,  no organomegaly Extremities: extremities normal, atraumatic, no cyanosis or edema  Lab Results:  Recent Labs  07/03/13 0445 07/04/13 0427  WBC 4.4 4.1  HGB 8.4* 8.8*  HCT 26.3* 27.3*  PLT 244 224   BMET:   Recent Labs  07/03/13 0445 07/04/13 0427  NA 140 139  K 4.3 3.9  CL 100 99  CO2 26 31  GLUCOSE 182* 200*  BUN 30* 13  CREATININE 4.90* 3.74*  CALCIUM 7.9* 7.8*   No results found for this basename: PTH,  in the last 72 hours Iron Studies: No results found for this basename: IRON, TIBC, TRANSFERRIN, FERRITIN,  in the last 72 hours  Studies/Results: No results found.  I have reviewed the patient's current medications.  Assessment/Plan: Problem #1 end-stage renal disease: She status post hemodialysis yesterday. She doesn't have any nausea vomiting. Her potassium is normal. Problem #2 GI bleeding. Presently patient has received blood transfusion. Patient was bleeding from stomach ulcer. Her hemoglobin is better today and no more bleading Problem #3 hypertension: Her blood pressure is under control Problem #4 history of  diabetes Problem #5 metabolic bone disease: Calcium and phosphorus is in range. Problem #6 hyperlipidemia. Problem #7 coronary disease Plan: Next dialysis is on Tuesday which her regular schedule. We'll check her basic metabolic panel and CBC in the morning.   LOS: 3 days   Lumen Brinlee S Keirsten Matuska 07/04/2013,8:10 AM

## 2013-07-05 ENCOUNTER — Telehealth: Payer: Self-pay | Admitting: Gastroenterology

## 2013-07-05 ENCOUNTER — Other Ambulatory Visit (HOSPITAL_COMMUNITY): Payer: Self-pay | Admitting: Internal Medicine

## 2013-07-05 ENCOUNTER — Encounter: Payer: Self-pay | Admitting: Gastroenterology

## 2013-07-05 ENCOUNTER — Inpatient Hospital Stay
Admission: RE | Admit: 2013-07-05 | Discharge: 2013-07-26 | Disposition: A | Discharge status: Home / Self Care | Payer: No Typology Code available for payment source | Source: Ambulatory Visit | Attending: Internal Medicine | Admitting: Internal Medicine

## 2013-07-05 DIAGNOSIS — I10 Essential (primary) hypertension: Secondary | ICD-10-CM

## 2013-07-05 LAB — TYPE AND SCREEN
ABO/RH(D): B POS
ANTIBODY SCREEN: NEGATIVE
UNIT DIVISION: 0
UNIT DIVISION: 0
Unit division: 0
Unit division: 0

## 2013-07-05 LAB — CBC
HEMATOCRIT: 26.7 % — AB (ref 36.0–46.0)
Hemoglobin: 8.4 g/dL — ABNORMAL LOW (ref 12.0–15.0)
MCH: 31 pg (ref 26.0–34.0)
MCHC: 31.5 g/dL (ref 30.0–36.0)
MCV: 98.5 fL (ref 78.0–100.0)
PLATELETS: 208 10*3/uL (ref 150–400)
RBC: 2.71 MIL/uL — ABNORMAL LOW (ref 3.87–5.11)
RDW: 20 % — AB (ref 11.5–15.5)
WBC: 4 10*3/uL (ref 4.0–10.5)

## 2013-07-05 LAB — BASIC METABOLIC PANEL
BUN: 19 mg/dL (ref 6–23)
CHLORIDE: 101 meq/L (ref 96–112)
CO2: 29 mEq/L (ref 19–32)
Calcium: 7.8 mg/dL — ABNORMAL LOW (ref 8.4–10.5)
Creatinine, Ser: 5.24 mg/dL — ABNORMAL HIGH (ref 0.50–1.10)
GFR calc non Af Amer: 8 mL/min — ABNORMAL LOW (ref >90)
GFR, EST AFRICAN AMERICAN: 9 mL/min — AB (ref >90)
Glucose, Bld: 187 mg/dL — ABNORMAL HIGH (ref 70–99)
Potassium: 3.9 mEq/L (ref 3.7–5.3)
Sodium: 141 mEq/L (ref 137–147)

## 2013-07-05 LAB — GLUCOSE, CAPILLARY
Glucose-Capillary: 306 mg/dL — ABNORMAL HIGH (ref 70–99)
Glucose-Capillary: 195 mg/dL — ABNORMAL HIGH (ref 70–99)

## 2013-07-05 MED ORDER — PANTOPRAZOLE SODIUM 40 MG PO TBEC: 40.0000 mg | DELAYED_RELEASE_TABLET | Freq: Two times a day (BID) | ORAL | Status: DC

## 2013-07-05 MED ORDER — PANTOPRAZOLE SODIUM 40 MG IV SOLR
INTRAVENOUS | Status: AC
  Filled 2013-07-05: qty 80

## 2013-07-05 MED ORDER — PANTOPRAZOLE SODIUM 40 MG PO TBEC
40.0000 mg | DELAYED_RELEASE_TABLET | Freq: Two times a day (BID) | ORAL | Status: DC
  Administered 2013-07-05: 40 mg via ORAL
  Filled 2013-07-05: qty 1

## 2013-07-05 MED ORDER — DEXTROSE 5 % IV SOLN: 2.0000 g | INTRAVENOUS | Status: DC

## 2013-07-05 MED ORDER — CLOPIDOGREL BISULFATE 75 MG PO TABS: ORAL_TABLET | ORAL | Status: DC

## 2013-07-05 MED ORDER — VANCOMYCIN HCL IN DEXTROSE 1-5 GM/200ML-% IV SOLN: 1000.0000 mg | INTRAVENOUS | Status: DC

## 2013-07-05 NOTE — Progress Notes (Signed)
Inpatient Diabetes Program Recommendations  AACE/ADA: New Consensus Statement on Inpatient Glycemic Control (2013)  Target Ranges:  Prepandial:   less than 140 mg/dL      Peak postprandial:   less than 180 mg/dL (1-2 hours)      Critically ill patients:  140 - 180 mg/dL   Results for Gerdeman, Irie A (MRN [id number]) as of 07/05/2013 09:21  Ref. Range 07/04/2013 07:45 07/04/2013 11:12 07/04/2013 16:13 07/04/2013 21:04 07/05/2013 07:59  Glucose-Capillary Latest Range: 70-99 mg/dL 212 (H) 176 (H) 198 (H) 263 (H) 306 (H)   Diabetes history: DM2 Outpatient Diabetes medications: 70/30 20 units in the am, 70/30 15 units in the evening Current orders for Inpatient glycemic control: Novolog 0-15 units AC, Novolog 0-5 units HS  Inpatient Diabetes Program Recommendations Insulin - Basal: Please consider ordering low dose basal insulin; recommend ordering 70/30 10 BID.  Thanks, Barnie Alderman, RN, MSN, CCRN Diabetes Coordinator Inpatient Diabetes Program [phone] (Team Pager) [phone] ([facility] office) [phone] [facility] office)

## 2013-07-05 NOTE — Discharge Summary (Signed)
Physician Discharge Summary  [name] WJX:[id number] DOB: December 28, 1945 DOA: 07/01/2013  PCP: Shirline Frees, MD  Admit date: 07/01/2013 Discharge date: 07/05/2013  Time spent: 40 minutes  Recommendations for Outpatient Follow-up:  1. Follow up with GI 6-8 weeks. Office will call. Surveillance EGD 12 weeks. GI to follow pending pathology and gastrin level. 2. Discharge to [facility]. Recommend cbc 1 week to track Hg.   Discharge Diagnoses:  Principal Problem:   Acute blood loss anemia Active Problems:   HYPERTENSION   CAD, NATIVE VESSEL   Chronic diastolic heart failure   ESRD (end stage renal disease)   Macrocytic anemia   Obesity, morbid   Type II or unspecified type diabetes mellitus with renal manifestations, uncontrolled   GI bleeding   GI bleed   Discharge Condition: stable  Diet recommendation: renal carb modified 1271ml fluid restriction  Filed Weights   07/01/13 2230 07/04/13 0644 07/05/13 0556  Weight: 73.1 kg (161 lb 2.5 oz) 72.439 kg (159 lb 11.2 oz) 73.982 kg (163 lb 1.6 oz)    History of present illness:  [name] is a 68 y.o. female with past medical history that includes diabetes, coronary artery disease with severe mitral regurg who underwent surgical repair in March 2015, end-stage renal disease currently on hemodialysis, obesity, presented to the emergency department from [facility] on 07/01/13 with the chief complaint of bright red blood per rectum. Patient indicated that she was admitted to the [facility] 2 days prior from [facility] after undergoing cardiac thoracic surgery March 2015. She stated that she got up that morning and was walking to the bathroom and her bowels moved before she could get there. She stated the blood was bright red. He denied any abdominal pain nausea vomiting. She denied any NSAID use but does take one 325 mg aspirin daily. Associated symptoms included increased fatigue and some shortness of breath with activity. She  denied dizziness syncope or near-syncope. She denied chest pain palpitations. Initial evaluation included complete blood count significant for hemoglobin of 7.1. Basic metabolic panel significant for creatinine of 6.02 and a BUN of 56. At the time of admission she had a moderate amount of maroon-colored blood per rectum. She was hemodynamically stable and not hypoxic.    Hospital Course:  Acute blood loss anemia: Secondary to GI blood loss. S/p 3 units PRBC's. Hg stable at 8.4 at discharge. No further bleeding noted. She remained hemodynamically stable. Will monitor closely  Active Problems:   GI bleeding: Appears to have resolved. Patient had EGD on 4/10 with results as below. No further bleeding. Provided with  Protonix infusion and transitioned to po at discharge. Aspirin Plavix held. At discharge discontinue aspirin and plavix to be resumed 07/09/13.  HYPERTENSION: stable at discharge.   CAD, NATIVE VESSEL: No chest pain. In March of 2015 patient underwent 3 vessel CABG with mitral valve fair.    Chronic diastolic heart failure: Patient on amiodarone. Remained compensated.   ESRD (end stage renal disease): Patient is a Tuesday Thursday Saturday hemodialysis patient. Nephrology followed. Will resume OP dialysis schedule  Macrocytic anemia: History of same. Has had GI evaluation in the past with no explanation for chronic anemia. See #1.   Obesity, morbid: BMI 25. Nutritional consult   Type II or unspecified type diabetes mellitus with renal manifestations, uncontrolled: Hg A1c 5.9 indicating fair control.   Procedures: EGD: 1. GI BLEED DUE TO LARGE DUODENAL ULCER  2. Small hiatal hernia  3. PYLORIC CHANNEL ULCER  4. Multiple CLEANED BASED ulcers in the duodenal bulb and 2nd part  of the duodenum   Consultations:  GI Dr Gala Romney  Discharge Exam: Filed Vitals:   07/05/13 0556  BP: 114/63  Pulse: 74  Temp: 97.5 F (36.4 C)  Resp: 18    General: well nourished  NAD Cardiovascular: RRR No MGR No LE edema Respiratory: normal effort BS clear bilaterally no wheeze   Discharge Instructions You were cared for by a hospitalist during your hospital stay. If you have any questions about your discharge medications or the care you received while you were in the hospital after you are discharged, you can call the unit and asked to speak with the hospitalist on call if the hospitalist that took care of you is not available. Once you are discharged, your primary care physician will handle any further medical issues. Please note that NO REFILLS for any discharge medications will be authorized once you are discharged, as it is imperative that you return to your primary care physician (or establish a relationship with a primary care physician if you do not have one) for your aftercare needs so that they can reassess your need for medications and monitor your lab values.     Medication List    STOP taking these medications       aspirin EC 81 MG tablet     ciprofloxacin 500 MG tablet  Commonly known as:  CIPRO      TAKE these medications       ALPRAZolam 0.5 MG tablet  Commonly known as:  XANAX  Take one tablet by mouth at bedtime as needed for anxiety     amiodarone 200 MG tablet  Commonly known as:  PACERONE  Take 200 mg by mouth daily.     b complex-vitamin c-folic acid 0.8 MG Tabs tablet  Take 0.8 mg by mouth every morning.     carvedilol 3.125 MG tablet  Commonly known as:  COREG  Take 3.125 mg by mouth 2 (two) times daily with a meal.     clopidogrel 75 MG tablet  Commonly known as:  PLAVIX  Hold until 07/09/13. May resume on 07/09/13     feeding supplement (PRO-STAT SUGAR FREE 64) Liqd  Take 30 mLs by mouth 2 (two) times daily between meals.     HUMULIN 70/30 [place]  - Inject 15-20 Units into the skin 2 (two) times daily. 20 units in the morning    - 15 units at night     meclizine 25 MG tablet  Commonly known as:  ANTIVERT  Take 50 mg by  mouth 2 (two) times daily as needed for dizziness.     pantoprazole 40 MG tablet  Commonly known as:  PROTONIX  Take 1 tablet (40 mg total) by mouth 2 (two) times daily.     simvastatin 40 MG tablet  Commonly known as:  ZOCOR  Take 40 mg by mouth at bedtime.       Allergies  Allergen Reactions  . Lisinopril Swelling      The results of significant diagnostics from this hospitalization (including imaging, microbiology, ancillary and laboratory) are listed below for reference.    Significant Diagnostic Studies: Dg Chest 2 View  06/27/2013   CLINICAL DATA:  Shortness of breath.  EXAM: CHEST  2 VIEW  COMPARISON:  01/10/2013.  FINDINGS: The heart is enlarged but stable. There are surgical changes from bypass surgery. There is vascular congestion and possible mild interstitial edema. A moderate-sized  left pleural effusion is noted with overlying atelectasis.  IMPRESSION: Cardiac enlargement with probable interstitial pulmonary edema and a moderate-sized left pleural effusion with overlying atelectasis.   Electronically Signed   By: Kalman Jewels M.D.   On: 06/27/2013 21:00    Microbiology: No results found for this or any previous visit (from the past 240 hour(s)).   Labs: Basic Metabolic Panel:  Recent Labs Lab 07/01/13 0939 07/02/13 0541 07/03/13 0445 07/04/13 0427 07/05/13 0425  NA 136* 141 140 139 141  K 5.3 4.5 4.3 3.9 3.9  CL 97 102 100 99 101  CO2 27 31 26 31 29   GLUCOSE 154* 180* 182* 200* 187*  BUN 56* 24* 30* 13 19  CREATININE 6.02* 3.31* 4.90* 3.74* 5.24*  CALCIUM 8.1* 7.4* 7.9* 7.8* 7.8*  PHOS  --  4.0  --   --   --    Liver Function Tests:  Recent Labs Lab 07/01/13 0939 07/02/13 0541  AST 18 20  ALT 18 17  ALKPHOS 78 64  BILITOT 0.2* 0.3  PROT 5.9* 5.0*  ALBUMIN 1.9* 1.9*   No results found for this basename: LIPASE, AMYLASE,  in the last 168 hours No results found for this basename: AMMONIA,  in the last 168 hours CBC:  Recent Labs Lab  07/01/13 0939  07/02/13 1154 07/02/13 2020 07/03/13 0445 07/04/13 0427 07/05/13 0425  WBC 6.0  < > 5.6 5.4 4.4 4.1 4.0  NEUTROABS 4.1  --   --   --   --   --   --   HGB 7.1*  < > 10.1* 9.1* 8.4* 8.8* 8.4*  HCT 23.4*  < > 31.3* 27.9* 26.3* 27.3* 26.7*  MCV 104.9*  < > 94.6 94.9 95.3 97.8 98.5  PLT 312  < > 252 236 244 224 208  < > = values in this interval not displayed. Cardiac Enzymes: No results found for this basename: CKTOTAL, CKMB, CKMBINDEX, TROPONINI,  in the last 168 hours BNP: BNP (last 3 results) No results found for this basename: PROBNP,  in the last 8760 hours CBG:  Recent Labs Lab 07/04/13 0745 07/04/13 1112 07/04/13 1613 07/04/13 2104 07/05/13 0759  GLUCAP 212* 176* 198* 263* 306*       Signed:  Lezlie Octave Brynlea Spindler  [facility] 07/05/2013, 11:34 AM

## 2013-07-05 NOTE — Plan of Care (Signed)
Problem: Discharge Progression Outcomes Goal: Discharge plan in place and appropriate Outcome: Completed/Met Date Met:  07/05/13 [place] for rehab Goal: Pain controlled with appropriate interventions Outcome: Completed/Met Date Met:  07/05/13 Denies pain Goal: Activity appropriate for discharge plan Outcome: Completed/Met Date Met:  07/05/13 Up in chair  Goal: Other Discharge Outcomes/Goals Outcome: Completed/Met Date Met:  07/05/13 HGB 8.4

## 2013-07-05 NOTE — Clinical Social Work Note (Signed)
Pt d/c today back to [facility]. Pt and facility aware and agreeable. Pt to transfer with RN.   Benay Pike, [place]

## 2013-07-05 NOTE — Telephone Encounter (Signed)
Patient of Dr. Oneida Alar. Needs OV with [place] in about 6-8 weeks. At that time will need to be set up for surveillance EGD.

## 2013-07-05 NOTE — Progress Notes (Signed)
Subjective: Interval History: No blood in the stool. No nausea or vomiting and no abdominal pain. No new complaint  Objective: Vital signs in last 24 hours: Temp:  [97.5 F (36.4 C)-98.3 F (36.8 C)] 97.5 F (36.4 C) (04/13 0556) Pulse Rate:  [70-77] 74 (04/13 0556) Resp:  [18] 18 (04/13 0556) BP: (114-122)/(40-63) 114/63 mmHg (04/13 0556) SpO2:  [97 %-100 %] 100 % (04/13 0556) Weight:  [73.982 kg (163 lb 1.6 oz)] 73.982 kg (163 lb 1.6 oz) (04/13 0556) Weight change: 1.542 kg (3 lb 6.4 oz)  Intake/Output from previous day: 04/12 0701 - 04/13 0700 In: 720 [P.O.:720] Out: -  Intake/Output this shift: Total I/O In: 240 [P.O.:240] Out: -   General appearance: alert, cooperative and no distress Resp: clear to auscultation bilaterally Cardio: regular rate and rhythm, S1, S2 normal, no murmur, click, rub or gallop GI: soft, non-tender; bowel sounds normal; no masses,  no organomegaly Extremities: extremities normal, atraumatic, no cyanosis or edema  Lab Results:  Recent Labs  07/04/13 0427 07/05/13 0425  WBC 4.1 4.0  HGB 8.8* 8.4*  HCT 27.3* 26.7*  PLT 224 208   BMET:   Recent Labs  07/04/13 0427 07/05/13 0425  NA 139 141  K 3.9 3.9  CL 99 101  CO2 31 29  GLUCOSE 200* 187*  BUN 13 19  CREATININE 3.74* 5.24*  CALCIUM 7.8* 7.8*   No results found for this basename: PTH,  in the last 72 hours Iron Studies: No results found for this basename: IRON, TIBC, TRANSFERRIN, FERRITIN,  in the last 72 hours  Studies/Results: No results found.  I have reviewed the patient's current medications.  Assessment/Plan: Problem #1 end-stage renal disease: She status post hemodialysis the day before yesterday. She doesn't have any nausea vomiting. Her potassium is normal. Problem #2 GI bleeding. Presently patient has received blood transfusion. Patient was bleeding from stomach ulcer. No history of bleeding the last 48 hours. Her hemoglobin slightly declining but other wise  stable Problem #3 hypertension: Her blood pressure is under control Problem #4 history of diabetes Problem #5 metabolic bone disease: Calcium and phosphorus is in range. Problem #6 hyperlipidemia. Problem #7 coronary disease. No chest pain Plan: No need for dialysis today We will make arrangement for dialysis in am which her regular schedule . We will dialyze here or if discharged she will go to her regular out patient unit. We'll check her basic metabolic panel and CBC in the morning.   LOS: 4 days   [name] 07/05/2013,9:26 AM

## 2013-07-05 NOTE — Progress Notes (Signed)
Report called to [facility] staff nurse Santiago Glad LPN

## 2013-07-05 NOTE — Discharge Summary (Signed)
Patient seen and examined. Above note reviewed.  Patient was admitted to the hospital with GI bleeding. She underwent EGD which revealed large duodenal ulcer. Aspirin and Plavix were held on admission. She was transfused 3 units of PRBCs her hospital stay. She received intravenous Protonix infusion. Her bleeding resolved and she's had normal bowel movements. Her hemoglobin has been stable. She will continue on Protonix twice a day for the next 3 months until repeat EGD is done. Aspirin has been held and Plavix will be resumed on 4/17. She'll followup with her cardiac surgeon in the next week. The patient underwent dialysis per nephrology on her schedule. Her next dialysis will be tomorrow. She is stable for discharge back to the skilled nursing facility.  [employer]

## 2013-07-05 NOTE — Discharge Instructions (Signed)
EGD Discharge instructions Please read the instructions outlined below and refer to this sheet in the next few weeks. These discharge instructions provide you with general information on caring for yourself after you leave the hospital. Your doctor may also give you specific instructions. While your treatment has been planned according to the most current medical practices available, unavoidable complications occasionally occur. If you have any problems or questions after discharge, please call your doctor. ACTIVITY  You may resume your regular activity but move at a slower pace for the next 24 hours.   Take frequent rest periods for the next 24 hours.   Walking will help expel (get rid of) the air and reduce the bloated feeling in your abdomen.   No driving for 24 hours (because of the anesthesia (medicine) used during the test).   You may shower.   Do not sign any important legal documents or operate any machinery for 24 hours (because of the anesthesia used during the test).  NUTRITION  Drink plenty of fluids.   You may resume your normal diet.   Begin with a light meal and progress to your normal diet.   Avoid alcoholic beverages for 24 hours or as instructed by your caregiver.  MEDICATIONS  You may resume your normal medications unless your caregiver tells you otherwise.  WHAT YOU CAN EXPECT TODAY  You may experience abdominal discomfort such as a feeling of fullness or gas pains.  FOLLOW-UP  Your doctor will discuss the results of your test with you.  SEEK IMMEDIATE MEDICAL ATTENTION IF ANY OF THE FOLLOWING OCCUR:  Excessive nausea (feeling sick to your stomach) and/or vomiting.   Severe abdominal pain and distention (swelling).   Trouble swallowing.   Temperature over 101 F (37.8 C).   Rectal bleeding or vomiting of blood.    Continue present medical regimen. Further recommendations to follow pending review of pathology

## 2013-07-05 NOTE — Progress Notes (Signed)
UR chart review completed.  

## 2013-07-05 NOTE — Progress Notes (Signed)
Lost Pt's IV site. Pt does not want another IV since she has been stuck so many times and she is suppose to go home today. MD paged. Dr. Darrick Meigs states that it is fine to leave IV out at this time.

## 2013-07-05 NOTE — Progress Notes (Signed)
    Subjective: No melena, abdominal pain, N/V. Wants to go home. No complaints.   Objective: Vital signs in last 24 hours: Temp:  [97.5 F (36.4 C)-98.3 F (36.8 C)] 97.5 F (36.4 C) (04/13 0556) Pulse Rate:  [70-77] 74 (04/13 0556) Resp:  [18] 18 (04/13 0556) BP: (114-122)/(40-63) 114/63 mmHg (04/13 0556) SpO2:  [97 %-100 %] 100 % (04/13 0556) Weight:  [163 lb 1.6 oz (73.982 kg)] 163 lb 1.6 oz (73.982 kg) (04/13 0556) Last BM Date: 07/04/13 General:   Alert and oriented, pleasant Head:  Normocephalic and atraumatic. Eyes:  No icterus, sclera clear. Conjuctiva pink.  Abdomen:  Bowel sounds present, soft, non-tender, non-distended. Peritoneal dialysis catheter in place. Ecchymosis bilateral lower abdomen, which patient states is chronic.  Neurologic:  Alert and  oriented x4;  grossly normal neurologically.   Intake/Output from previous day: 04/12 0701 - 04/13 0700 In: 720 [P.O.:720] Out: -  Intake/Output this shift:    Lab Results:  Recent Labs  07/03/13 0445 07/04/13 0427 07/05/13 0425  WBC 4.4 4.1 4.0  HGB 8.4* 8.8* 8.4*  HCT 26.3* 27.3* 26.7*  PLT 244 224 208   BMET  Recent Labs  07/03/13 0445 07/04/13 0427 07/05/13 0425  NA 140 139 141  K 4.3 3.9 3.9  CL 100 99 101  CO2 26 31 29   GLUCOSE 182* 200* 187*  BUN 30* 13 19  CREATININE 4.90* 3.74* 5.24*  CALCIUM 7.9* 7.8* 7.8*     Assessment: 68 year old with UGI bleed secondary to PUD, Hgb stable, gastrin level and final path pending. No further evidence of overt GI bleeding. Will discontinue PPI drip and start Protonix po BID. Appropriate for discharge from a GI standpoint.   Plan: Hold aspirin  Plavix may resume April 17 (per operative note on 4/10) Will follow-up on pending pathology and gastrin level Surveillance EGD in 12 weeks Will arrange outpatient follow-up for 6-8 weeks from now.   Orvil Feil, ANP-BC [facility]  9:26 AM

## 2013-07-05 NOTE — Telephone Encounter (Signed)
Pt is aware of OV on 5/27 at 11 with [facility] and appt card mailed

## 2013-07-06 ENCOUNTER — Other Ambulatory Visit: Payer: Self-pay | Admitting: *Deleted

## 2013-07-06 ENCOUNTER — Encounter (HOSPITAL_COMMUNITY): Payer: Self-pay | Admitting: Gastroenterology

## 2013-07-06 LAB — GLUCOSE, CAPILLARY
GLUCOSE-CAPILLARY: 184 mg/dL — AB (ref 70–99)
Glucose-Capillary: 265 mg/dL — ABNORMAL HIGH (ref 70–99)
Glucose-Capillary: 164 mg/dL — ABNORMAL HIGH (ref 70–99)

## 2013-07-06 LAB — GASTRIN: Gastrin: 365 pg/mL — ABNORMAL HIGH (ref <=100)

## 2013-07-06 MED ORDER — ALPRAZOLAM 0.5 MG PO TABS: ORAL_TABLET | ORAL | Status: DC

## 2013-07-06 NOTE — Telephone Encounter (Signed)
[facility] 

## 2013-07-07 LAB — GLUCOSE, CAPILLARY
GLUCOSE-CAPILLARY: 214 mg/dL — AB (ref 70–99)
GLUCOSE-CAPILLARY: 181 mg/dL — AB (ref 70–99)
GLUCOSE-CAPILLARY: 129 mg/dL — AB (ref 70–99)
Glucose-Capillary: 150 mg/dL — ABNORMAL HIGH (ref 70–99)
Glucose-Capillary: 119 mg/dL — ABNORMAL HIGH (ref 70–99)
Glucose-Capillary: 115 mg/dL — ABNORMAL HIGH (ref 70–99)
Glucose-Capillary: 202 mg/dL — ABNORMAL HIGH (ref 70–99)

## 2013-07-08 LAB — GLUCOSE, CAPILLARY
GLUCOSE-CAPILLARY: 77 mg/dL (ref 70–99)
Glucose-Capillary: 188 mg/dL — ABNORMAL HIGH (ref 70–99)

## 2013-07-09 LAB — GLUCOSE, CAPILLARY
Glucose-Capillary: 194 mg/dL — ABNORMAL HIGH (ref 70–99)
Glucose-Capillary: 166 mg/dL — ABNORMAL HIGH (ref 70–99)
Glucose-Capillary: 126 mg/dL — ABNORMAL HIGH (ref 70–99)
Glucose-Capillary: 79 mg/dL (ref 70–99)

## 2013-07-10 LAB — GLUCOSE, CAPILLARY
GLUCOSE-CAPILLARY: 146 mg/dL — AB (ref 70–99)
GLUCOSE-CAPILLARY: 119 mg/dL — AB (ref 70–99)
Glucose-Capillary: 190 mg/dL — ABNORMAL HIGH (ref 70–99)

## 2013-07-11 LAB — GLUCOSE, CAPILLARY
GLUCOSE-CAPILLARY: 146 mg/dL — AB (ref 70–99)
GLUCOSE-CAPILLARY: 167 mg/dL — AB (ref 70–99)
GLUCOSE-CAPILLARY: 135 mg/dL — AB (ref 70–99)
Glucose-Capillary: 123 mg/dL — ABNORMAL HIGH (ref 70–99)
Glucose-Capillary: 139 mg/dL — ABNORMAL HIGH (ref 70–99)
Glucose-Capillary: 267 mg/dL — ABNORMAL HIGH (ref 70–99)

## 2013-07-12 ENCOUNTER — Non-Acute Institutional Stay (SKILLED_NURSING_FACILITY): Payer: Medicare Other | Admitting: Internal Medicine

## 2013-07-12 DIAGNOSIS — E1165 Type 2 diabetes mellitus with hyperglycemia: Secondary | ICD-10-CM

## 2013-07-12 DIAGNOSIS — K269 Duodenal ulcer, unspecified as acute or chronic, without hemorrhage or perforation: Secondary | ICD-10-CM

## 2013-07-12 DIAGNOSIS — K264 Chronic or unspecified duodenal ulcer with hemorrhage: Secondary | ICD-10-CM

## 2013-07-12 DIAGNOSIS — E1129 Type 2 diabetes mellitus with other diabetic kidney complication: Secondary | ICD-10-CM

## 2013-07-12 DIAGNOSIS — I2510 Atherosclerotic heart disease of native coronary artery without angina pectoris: Secondary | ICD-10-CM

## 2013-07-12 LAB — GLUCOSE, CAPILLARY
Glucose-Capillary: 160 mg/dL — ABNORMAL HIGH (ref 70–99)
Glucose-Capillary: 102 mg/dL — ABNORMAL HIGH (ref 70–99)

## 2013-07-14 LAB — GLUCOSE, CAPILLARY
GLUCOSE-CAPILLARY: 188 mg/dL — AB (ref 70–99)
GLUCOSE-CAPILLARY: 217 mg/dL — AB (ref 70–99)
GLUCOSE-CAPILLARY: 204 mg/dL — AB (ref 70–99)
Glucose-Capillary: 272 mg/dL — ABNORMAL HIGH (ref 70–99)
Glucose-Capillary: 134 mg/dL — ABNORMAL HIGH (ref 70–99)
Glucose-Capillary: 164 mg/dL — ABNORMAL HIGH (ref 70–99)
Glucose-Capillary: 176 mg/dL — ABNORMAL HIGH (ref 70–99)
Glucose-Capillary: 191 mg/dL — ABNORMAL HIGH (ref 70–99)
Glucose-Capillary: 210 mg/dL — ABNORMAL HIGH (ref 70–99)

## 2013-07-14 NOTE — Progress Notes (Addendum)
Patient ID: [name], female   DOB: [birth year], 68 y.o.   MRN: [id number]                  HISTORY & PHYSICAL  DATE:  07/12/2013   FACILITY: [facility]    LEVEL OF CARE:   SNF   CHIEF COMPLAINT:  Readmission to SNF, post stay at [facility], 07/01/2013 through 07/05/2013.  HISTORY OF PRESENT ILLNESS:  This is a patient who recently came here from [facility].  She had undergone surgical repair of severe mitral regurgitation.  She has end-stage renal disease, on dialysis.    She had only been here a short period of time.  I had seen her once.  At that time, she had complained vaguely of abdominal pain.   Apparently the next morning, she was on her way to the bathroom, passed bright red blood per rectum.  She was on both Plavix and aspirin.  She has not had any further vomiting.    LABORATORY DATA/RADIOLOGY:   Her hemoglobin was 7.1.    Basic metabolic panel significant for a creatinine of 6.02.    A large duodenal ulcer, a small gastric pyloric channel ulcer, and multiple clean-based ulcers in the duodenal bulb and second part of the duodenum.    Pathology is pending.    A gastrin level was done that was also pending.    She is due for a follow-up EGD in 12 weeks.    PAST MEDICAL HISTORY/PROBLEM LIST:  Includes:    GI ulcerations.  She is now on Protonix.  Her aspirin and Plavix were  apparently restarted.    Hypertension.    Coronary artery disease.    Status post surgical repair of mitral valve.    End-stage renal disease.  On dialysis.    Type 2 diabetes.     CURRENT MEDICATIONS:  Discharge medications include:    Xanax, 1 tablet, 0.5 q.h.s. p.r.n.     Amiodarone 200 daily.    Carvedilol 3.125 b.i.d.    Plavix 75 q.d.    Humulin 70/30 insulin, 20 in the morning and 15 at night.    Protonix 40 b.i.d.    Zocor 40 q.d.    SOCIAL HISTORY:  Please see my initial H&P.     REVIEW OF SYSTEMS:   CHEST/RESPIRATORY:  The patient is not  complaining of shortness of breath.   CARDIAC:   No chest pain.   GI:  No nausea,  vomiting or abdominal pain.  She is not having hematemesis or hematochezia, although she did have one diarrheal stool this morning.  She stated there was no blood in this.    PHYSICAL EXAMINATION:   VITAL SIGNS:   PULSE:  75 and regular.   O2 SATURATIONS:  88%.   RESPIRATIONS:  18 and unlabored.   CHEST/RESPIRATORY:  Clear air entry bilaterally.   CARDIOVASCULAR:  CARDIAC:   Heart sounds are normal.  There are no murmurs.  Her JVP is elevated.   EDEMA/VARICOSITIES:  She has 2+ peripheral, 1+ coccyx edema.   GASTROINTESTINAL:  ABDOMEN:   Soft.  There is no tenderness.  No masses.    ASSESSMENT/PLAN:  GI bleeding secondary to duodenal and prepyloric gastric ulcers.  Gastrin level and biopsies pending.    Status post cardiac surgery.  She appears to be stable from this regard.    End-stage renal disease.  On dialysis.  She currently appears to be slightly fluid-overloaded.  Type 2 diabetes.  On insulin.  Her blood sugars are in the mid 100s mostly.  We will follow this.    The patient appears to be quite stable at the moment.  We will follow her hemoglobin.

## 2013-07-15 LAB — GLUCOSE, CAPILLARY
GLUCOSE-CAPILLARY: 211 mg/dL — AB (ref 70–99)
GLUCOSE-CAPILLARY: 165 mg/dL — AB (ref 70–99)
Glucose-Capillary: 153 mg/dL — ABNORMAL HIGH (ref 70–99)

## 2013-07-16 ENCOUNTER — Non-Acute Institutional Stay (SKILLED_NURSING_FACILITY): Payer: Medicare Other | Admitting: Internal Medicine

## 2013-07-16 DIAGNOSIS — Z8719 Personal history of other diseases of the digestive system: Secondary | ICD-10-CM | POA: Insufficient documentation

## 2013-07-16 DIAGNOSIS — R52 Pain, unspecified: Secondary | ICD-10-CM

## 2013-07-16 LAB — GLUCOSE, CAPILLARY
GLUCOSE-CAPILLARY: 224 mg/dL — AB (ref 70–99)
GLUCOSE-CAPILLARY: 204 mg/dL — AB (ref 70–99)
Glucose-Capillary: 137 mg/dL — ABNORMAL HIGH (ref 70–99)
Glucose-Capillary: 141 mg/dL — ABNORMAL HIGH (ref 70–99)

## 2013-07-16 NOTE — Progress Notes (Signed)
Patient ID: [name], female   DOB: [birth year], 68 y.o.   MRN: [id number]   This is an acute visit.  Level care skilled.  Facility [facility].  Chief complaint-acute visit secondary to intermittent right hip-abdominal discomfort.  History of present illness.  Patient is a pleasant elderly resident who has had somewhat of a complicated course recently.  She did have a mitral valve replacement and came to this facility this was complicated with a GI bleed which required another hospitalization this was thought do to a duodenal ulcer--she is undergoing regular hemodialysis secondary to end-stage renal disease.  Apparently she's been complaining at times to nursing staff of some intermittent right hip right abdominal discomfort however when I spoke to her today she said she's not having any discomfort at this is something that just lasts for a short time and then quickly resolved she does not complaining of any nausea vomiting or any further rectal bleeding nursing staff has not noted any either.  Family medical social history as been reviewed per previous progress notes on April 7 and 07/12/2013.  Medications have been reviewed.  Review of systems.  As stated above.  In general no complaints fever or chills.  Respiratory no complaint of shortness of breath.  Cardiac-no chest pain.  GI -- history as noted above but she is not complaining of any abdominal discomfort nausea or vomiting.  Muscle skeletal-again at times currently complains of some upper left hip discomfort right lateral side but does not complaining that today.  Physical exam.  She is afebrile. Pulse is 72 respirations of 18  General this is a very pleasant elderly resident in no distress sitting in her wheelchair.  Her skin is warm and dry along abdominal area there is bruising suspect this is from previous Lovenox injections.  Chest is clear to auscultation no labored breathing.  Heart is regular rate and rhythm  without murmur gallop or rub.  Abdomen is soft nontender with positive bowel sounds.  There is no pain with palpation of the right side.  Muscle skeletal ambulates in a wheelchair I did flex and extend her hip there was no pain with this I cannot note any deformity or pain with palpation of the hip area.  Assessment and plan.  #1-right hip abdominal discomfort-patient is denying any discomfort today we'll continue to monitor I did speak with nursing staff about this-at this point this appears to be quite benign but will have to be watched.  [id number]

## 2013-07-17 DIAGNOSIS — R52 Pain, unspecified: Secondary | ICD-10-CM | POA: Insufficient documentation

## 2013-07-17 LAB — GLUCOSE, CAPILLARY
Glucose-Capillary: 133 mg/dL — ABNORMAL HIGH (ref 70–99)
Glucose-Capillary: 232 mg/dL — ABNORMAL HIGH (ref 70–99)
Glucose-Capillary: 162 mg/dL — ABNORMAL HIGH (ref 70–99)

## 2013-07-18 LAB — GLUCOSE, CAPILLARY
GLUCOSE-CAPILLARY: 170 mg/dL — AB (ref 70–99)
Glucose-Capillary: 182 mg/dL — ABNORMAL HIGH (ref 70–99)
Glucose-Capillary: 181 mg/dL — ABNORMAL HIGH (ref 70–99)
Glucose-Capillary: 309 mg/dL — ABNORMAL HIGH (ref 70–99)

## 2013-07-19 LAB — GLUCOSE, CAPILLARY
GLUCOSE-CAPILLARY: 224 mg/dL — AB (ref 70–99)
Glucose-Capillary: 362 mg/dL — ABNORMAL HIGH (ref 70–99)
Glucose-Capillary: 365 mg/dL — ABNORMAL HIGH (ref 70–99)

## 2013-07-20 ENCOUNTER — Telehealth: Payer: Self-pay | Admitting: Gastroenterology

## 2013-07-20 LAB — GLUCOSE, CAPILLARY
GLUCOSE-CAPILLARY: 110 mg/dL — AB (ref 70–99)
GLUCOSE-CAPILLARY: 306 mg/dL — AB (ref 70–99)
GLUCOSE-CAPILLARY: 264 mg/dL — AB (ref 70–99)
Glucose-Capillary: 261 mg/dL — ABNORMAL HIGH (ref 70–99)
Glucose-Capillary: 196 mg/dL — ABNORMAL HIGH (ref 70–99)

## 2013-07-20 NOTE — Telephone Encounter (Signed)
Please call pt. HER stomach Bx shows SHE HAD AN ULCER FROM USING ASPIRIN. SHE DEVELOPED THE ULCER IN SPITE OF BEING ON [employer]. HER GASTRIN LEVEL IS ELEVATED DUE TO USING PROTONIX. REPEAT EGD IN  JUN 2015 TO RE-ASSESS ULCER. SHE SHOULD DISCUSS WITH DR. Gala Romney THE BENEFITS V. RISKS OF CONTINUING TO USE ASPIRIN. HOLD ASPIRIN FOR NOW. CONTINUE PLAVIX.

## 2013-07-21 LAB — GLUCOSE, CAPILLARY
GLUCOSE-CAPILLARY: 245 mg/dL — AB (ref 70–99)
Glucose-Capillary: 177 mg/dL — ABNORMAL HIGH (ref 70–99)
Glucose-Capillary: 287 mg/dL — ABNORMAL HIGH (ref 70–99)
Glucose-Capillary: 275 mg/dL — ABNORMAL HIGH (ref 70–99)

## 2013-07-21 NOTE — Telephone Encounter (Signed)
Called pt's nurse at [facility], Karalee Height, LPN. Informed her of this info and also faxed her the copy of the info to [phone].

## 2013-07-21 NOTE — Telephone Encounter (Signed)
Agree with holding aspirin; continue Plavix. She should be on Protonix 40 mg twice daily. Office visit as recommended

## 2013-07-21 NOTE — Telephone Encounter (Signed)
Routing to RMR for review. 

## 2013-07-22 LAB — GLUCOSE, CAPILLARY
Glucose-Capillary: 155 mg/dL — ABNORMAL HIGH (ref 70–99)
Glucose-Capillary: 143 mg/dL — ABNORMAL HIGH (ref 70–99)

## 2013-07-22 NOTE — Telephone Encounter (Signed)
Pt has office visit on 08/18/13 at 11:00 am. Will fax this information to the [facility].

## 2013-07-23 ENCOUNTER — Encounter: Payer: Self-pay | Admitting: Internal Medicine

## 2013-07-23 ENCOUNTER — Non-Acute Institutional Stay (SKILLED_NURSING_FACILITY): Payer: Medicare Other | Admitting: Internal Medicine

## 2013-07-23 DIAGNOSIS — I059 Rheumatic mitral valve disease, unspecified: Secondary | ICD-10-CM

## 2013-07-23 DIAGNOSIS — K269 Duodenal ulcer, unspecified as acute or chronic, without hemorrhage or perforation: Secondary | ICD-10-CM

## 2013-07-23 DIAGNOSIS — N186 End stage renal disease: Secondary | ICD-10-CM

## 2013-07-23 DIAGNOSIS — I340 Nonrheumatic mitral (valve) insufficiency: Secondary | ICD-10-CM

## 2013-07-23 DIAGNOSIS — I10 Essential (primary) hypertension: Secondary | ICD-10-CM

## 2013-07-23 DIAGNOSIS — E1129 Type 2 diabetes mellitus with other diabetic kidney complication: Secondary | ICD-10-CM

## 2013-07-23 DIAGNOSIS — E1165 Type 2 diabetes mellitus with hyperglycemia: Secondary | ICD-10-CM

## 2013-07-23 DIAGNOSIS — I5032 Chronic diastolic (congestive) heart failure: Secondary | ICD-10-CM

## 2013-07-23 LAB — GLUCOSE, CAPILLARY
GLUCOSE-CAPILLARY: 201 mg/dL — AB (ref 70–99)
Glucose-Capillary: 159 mg/dL — ABNORMAL HIGH (ref 70–99)
Glucose-Capillary: 186 mg/dL — ABNORMAL HIGH (ref 70–99)

## 2013-07-23 NOTE — Progress Notes (Signed)
Patient ID: [name], female   DOB: 10-04-1945, 68 y.o.   MRN: [id number]    FACILITY: [facility]  LEVEL OF CARE: SNF This is a discharge note.    CHIEF COMPLAINT: Discharge note .  HISTORY OF PRESENT ILLNESS: This is a patient who recently came here from [facility]. She had undergone surgical repair of severe mitral regurgitation. She has end-stage renal disease, on dialysis.  She had only been here a short period of time. . At that time, she had complained vaguely of abdominal pain. Apparently the next morning, she was on her way to the bathroom, passed bright red blood per rectum. She was on both Plavix and aspirin. --Was readmitted to the hospital for duodonall and prepyloric ulcers--this is followed by [facility] is on Protonix twice a day and this appears to be stable most recent hemoglobin earlier this week was 9.1 which show stability  Of note previous hospitalization she also was diagnosed with peritonitis and continues on vancomycin and cefepime I at dialysis she continues to have a drain in place--  She is doing quite well does not complaining of any shortness of breath greater than baseline denies any chest pain has been doing well with therapy and is very motivated to go home she has a very supportive family her husband who is with her most of the time while she is in the facility-.  She will need continued PT and OT for strengthening as well as home health nursing support for her medical issues including renal issues and history of GI bleed and recent cardiac surgery.  She also will need a rolling walker for help with ambulation secondary to some continued weakness   /RADIOLOGY:   .  A large duodenal ulcer, a small gastric pyloric channel ulcer, and multiple clean-based ulcers in the duodenal bulb and second part of the duodenum Gastrin level was elevated this was thought secondary to protonic shoes.  Biopsy per chart review showed ulcers caused by aspirin most  likely.     She is due for a follow-up EGD in June.  PAST MEDICAL HISTORY/PROBLEM LIST: Includes:  GI ulcerations. She is now on Protonix. Her aspirin and Plavix were apparently restarted.  Hypertension.  Coronary artery disease.  Status post surgical repair of mitral valve.  End-stage renal disease. On dialysis.  Type 2 diabetes.  CURRENT MEDICATIONS: Discharge medications include:  Xanax, 1 tablet, 0.5 q.h.s. p.r.n.  Amiodarone 200 daily.  Carvedilol 1.5625 mg b.i.d.  Plavix 75 q.d.  Humulin 70/30 insulin, 20 in the morning and 10 at night.  Protonix 40 b.i.d.  Zocor 40 q.d.  SOCIAL HISTORY lives with her husband they have been married 62 years .  REVIEW OF SYSTEMS: Gen. no complaints of fever or chills says she feels stronger.  Head ears eyes nose mouth and throat does not complaining of visual changes or sore throat  CHEST/RESPIRATORY:   patient is not complaining of shortness of breath.  CARDIAC: No chest pain.  GI: No nausea, vomiting or abdominal pain. She is not having hematemesis or hematochezia, Does not complaining of any abdominal discomfort.  Musculoskeletal does not complaining of joint pain --is able to ambulate but is quite unsteady and certainly would benefit from a rolling walker  Neurologic does not complaining of headache dizziness or syncopal-type feelings.  Psych apparently some history of anxiety but this appears to be well-controlled-does have Xanax as needed at night .  PHYSICAL EXAMINATION:   Temperature 97.3 pulse 63 respirations  20 blood pressure 122/50 most recent weight 165.8 Gen. this is a very pleasant elderly female in no distress sitting comfortably in her wheelchair.  Her skin is warm and dry-she does have a small open area mediall aspect under the left knee--there is no drainage or bleeding a small amount of erythema around this this does not appear to be cellulitic-sutures were recently removed around the site at this point does not  appear to be of great concern.  CHEST/RESPIRATORY: Clear air entry bilaterally--.  CARDIOVASCULAR:  CARDIAC: Heart sounds are normal. There are no murmurs.   EDEMA/VARICOSITIES: She has 2+ peripheral, 1+ coccyx edema.  GASTROINTESTINAL:  ABDOMEN: Soft. There is no tenderness. No masses drain is in place left midabdomen.  Muscle skeletal--moves all extremities x4 I do not really see significant lower extremity edema-she is able to ambulate without assistance but is quite unsteady and appears to be a fall risk would benefit from a rolling walker.  .  Neurologic grossly intact no lateralizing findings speech is clear.  Psych she is alert and oriented x3 very pleasant and appropriate.  Labs.  07/19/2013.  WBC 4.0 hemoglobin 9.1 platelets 248.  07/07/2013.  Sodium 139 potassium 3.8 BUN 13 creatinine 4.39.  Liver function tests within normal limits except albumin of 2.0..  ASSESSMENT/PLAN:  GI bleeding secondary to duodenal and prepyloric gastric ulcers. Gastrin level and biopsies as noted above-she does continue on Plavix no longer on aspirin-this will be followed by GI-her hemoglobin has been stable with no further bleeding noted. Hx peritonitis-this appears to be resolving she continues on antibiotics at dialysis and has a drain-- her husband who is quite familiar with taking care of this  Status post cardiac surgery. She appears to be stable from this regard--Will have cardiology followup but appears to be relatively asymptomatic which is encouraging that she does continue on Plavix.  End-stage renal disease. On dialysis. This has been stable clinically.  Type 2 diabetes. On insulin. Her blood sugars -- appears they run in the higher 100s generally in the morning-somewhat more variable later in the day with higher 100s-low-mid 200s generally.--Would be hesitant to change medications right for discharge will need followup by PCP as well as dialysis.  Hypertension-this appears stable  recent blood pressure is 122/50 112/72-134/62.  Of note she will be continued PT and OT for strengthening as well as rolling walker to help with ambulation secondary to weakness and fall risk-also will need nursing and home health support for her complex medical issues including end-stage renal disease-GI bleed-and recent cardiac surgery--as well as recent history of peritonitis  [id number]-of note greater than 30 minutes spent on this discharge summary-greater than 50% of time spent coordinating plan of care

## 2013-07-24 LAB — GLUCOSE, CAPILLARY: GLUCOSE-CAPILLARY: 276 mg/dL — AB (ref 70–99)

## 2013-07-25 ENCOUNTER — Other Ambulatory Visit (HOSPITAL_BASED_OUTPATIENT_CLINIC_OR_DEPARTMENT_OTHER): Payer: Self-pay | Admitting: Internal Medicine

## 2013-07-25 LAB — GLUCOSE, CAPILLARY: GLUCOSE-CAPILLARY: 196 mg/dL — AB (ref 70–99)

## 2013-07-26 ENCOUNTER — Ambulatory Visit: Payer: Medicare Other | Admitting: Physician Assistant

## 2013-07-26 LAB — GLUCOSE, CAPILLARY
GLUCOSE-CAPILLARY: 234 mg/dL — AB (ref 70–99)
GLUCOSE-CAPILLARY: 240 mg/dL — AB (ref 70–99)
Glucose-Capillary: 339 mg/dL — ABNORMAL HIGH (ref 70–99)
Glucose-Capillary: 184 mg/dL — ABNORMAL HIGH (ref 70–99)
Glucose-Capillary: 213 mg/dL — ABNORMAL HIGH (ref 70–99)
Glucose-Capillary: 176 mg/dL — ABNORMAL HIGH (ref 70–99)
Glucose-Capillary: 135 mg/dL — ABNORMAL HIGH (ref 70–99)
Glucose-Capillary: 168 mg/dL — ABNORMAL HIGH (ref 70–99)

## 2013-07-28 ENCOUNTER — Other Ambulatory Visit: Payer: Self-pay | Admitting: *Deleted

## 2013-07-28 MED ORDER — AMIODARONE HCL 200 MG PO TABS: 200.0000 mg | ORAL_TABLET | Freq: Every day | ORAL | Status: DC

## 2013-08-02 ENCOUNTER — Other Ambulatory Visit (HOSPITAL_BASED_OUTPATIENT_CLINIC_OR_DEPARTMENT_OTHER): Payer: Self-pay | Admitting: Internal Medicine

## 2013-08-02 ENCOUNTER — Telehealth: Payer: Self-pay | Admitting: Cardiovascular Disease

## 2013-08-02 NOTE — Telephone Encounter (Signed)
New Message:  [name] is asking for clarification on Ameordorone and simvastatin. States that its a class one drug interaction. She is requesting a call back

## 2013-08-02 NOTE — Telephone Encounter (Signed)
Chart reviewed and pt had recent CABG/Mitral Valve Repair at [facility] has upcoming appt with Truitt Merle, NP on Aug 04, 2013.  I reviewed medication interaction with Alferd Apa, PharmD and pt should quarter dose of Simvastatin (to total 10 mg daily) until seen in office this week. Atorvastatin does not interfere with Simvastatin and pt can be changed at office visit. I spoke with [place] (home health nurse) and gave her this information. Lawerance Bach will notify pt.  Need to determine if pt will be following in our office or at [facility] for cholesterol management.

## 2013-08-03 ENCOUNTER — Ambulatory Visit: Payer: Medicare Other | Admitting: Physician Assistant

## 2013-08-04 ENCOUNTER — Encounter: Payer: Medicare Other | Admitting: Nurse Practitioner

## 2013-08-04 ENCOUNTER — Telehealth: Payer: Self-pay | Admitting: Cardiovascular Disease

## 2013-08-04 NOTE — Telephone Encounter (Signed)
Spoke with Juliann Pulse from [employer]. Advised her that patient had surgery performed at [facility] and she would need to have the surgeon to address the wound issue (since it is a surgerical wound). She states she will call surgeon to follow up on this matter.

## 2013-08-04 NOTE — Telephone Encounter (Signed)
New problem   Pt has wound to left inner calf its her cabg site that is draining and has slough in it. Please call [facility] back after pt has been seen so they will know what type of wound care to give patient

## 2013-08-18 ENCOUNTER — Telehealth: Payer: Self-pay | Admitting: Gastroenterology

## 2013-08-18 ENCOUNTER — Inpatient Hospital Stay (HOSPITAL_COMMUNITY)
Admission: EM | Admit: 2013-08-18 | Discharge: 2013-08-23 | DRG: 061 | Disposition: A | Discharge status: Home Health | Payer: Medicare Other | Attending: Neurology | Admitting: Neurology

## 2013-08-18 ENCOUNTER — Encounter: Payer: Self-pay | Admitting: Gastroenterology

## 2013-08-18 ENCOUNTER — Ambulatory Visit: Payer: Medicare Other | Admitting: Gastroenterology

## 2013-08-18 DIAGNOSIS — R4789 Other speech disturbances: Secondary | ICD-10-CM | POA: Diagnosis present

## 2013-08-18 DIAGNOSIS — E119 Type 2 diabetes mellitus without complications: Secondary | ICD-10-CM | POA: Diagnosis present

## 2013-08-18 DIAGNOSIS — Z992 Dependence on renal dialysis: Secondary | ICD-10-CM

## 2013-08-18 DIAGNOSIS — Z7682 Awaiting organ transplant status: Secondary | ICD-10-CM

## 2013-08-18 DIAGNOSIS — I639 Cerebral infarction, unspecified: Secondary | ICD-10-CM

## 2013-08-18 DIAGNOSIS — R4701 Aphasia: Secondary | ICD-10-CM | POA: Diagnosis present

## 2013-08-18 DIAGNOSIS — D638 Anemia in other chronic diseases classified elsewhere: Secondary | ICD-10-CM | POA: Diagnosis present

## 2013-08-18 DIAGNOSIS — N186 End stage renal disease: Secondary | ICD-10-CM | POA: Diagnosis present

## 2013-08-18 DIAGNOSIS — D62 Acute posthemorrhagic anemia: Secondary | ICD-10-CM

## 2013-08-18 DIAGNOSIS — F411 Generalized anxiety disorder: Secondary | ICD-10-CM | POA: Diagnosis present

## 2013-08-18 DIAGNOSIS — Z794 Long term (current) use of insulin: Secondary | ICD-10-CM

## 2013-08-18 DIAGNOSIS — R1310 Dysphagia, unspecified: Secondary | ICD-10-CM | POA: Diagnosis present

## 2013-08-18 DIAGNOSIS — J9600 Acute respiratory failure, unspecified whether with hypoxia or hypercapnia: Secondary | ICD-10-CM

## 2013-08-18 DIAGNOSIS — T8189XA Other complications of procedures, not elsewhere classified, initial encounter: Secondary | ICD-10-CM | POA: Diagnosis present

## 2013-08-18 DIAGNOSIS — G8190 Hemiplegia, unspecified affecting unspecified side: Secondary | ICD-10-CM | POA: Diagnosis present

## 2013-08-18 DIAGNOSIS — Y849 Medical procedure, unspecified as the cause of abnormal reaction of the patient, or of later complication, without mention of misadventure at the time of the procedure: Secondary | ICD-10-CM | POA: Diagnosis present

## 2013-08-18 DIAGNOSIS — I340 Nonrheumatic mitral (valve) insufficiency: Secondary | ICD-10-CM | POA: Diagnosis present

## 2013-08-18 DIAGNOSIS — Z8673 Personal history of transient ischemic attack (TIA), and cerebral infarction without residual deficits: Secondary | ICD-10-CM | POA: Diagnosis present

## 2013-08-18 DIAGNOSIS — J4489 Other specified chronic obstructive pulmonary disease: Secondary | ICD-10-CM | POA: Diagnosis present

## 2013-08-18 DIAGNOSIS — E785 Hyperlipidemia, unspecified: Secondary | ICD-10-CM | POA: Diagnosis present

## 2013-08-18 DIAGNOSIS — M949 Disorder of cartilage, unspecified: Secondary | ICD-10-CM

## 2013-08-18 DIAGNOSIS — N2581 Secondary hyperparathyroidism of renal origin: Secondary | ICD-10-CM | POA: Diagnosis present

## 2013-08-18 DIAGNOSIS — Z79899 Other long term (current) drug therapy: Secondary | ICD-10-CM

## 2013-08-18 DIAGNOSIS — I120 Hypertensive chronic kidney disease with stage 5 chronic kidney disease or end stage renal disease: Secondary | ICD-10-CM | POA: Diagnosis present

## 2013-08-18 DIAGNOSIS — I4891 Unspecified atrial fibrillation: Secondary | ICD-10-CM | POA: Diagnosis not present

## 2013-08-18 DIAGNOSIS — I059 Rheumatic mitral valve disease, unspecified: Secondary | ICD-10-CM | POA: Diagnosis present

## 2013-08-18 DIAGNOSIS — I10 Essential (primary) hypertension: Secondary | ICD-10-CM | POA: Diagnosis present

## 2013-08-18 DIAGNOSIS — Z87891 Personal history of nicotine dependence: Secondary | ICD-10-CM

## 2013-08-18 DIAGNOSIS — M899 Disorder of bone, unspecified: Secondary | ICD-10-CM | POA: Diagnosis present

## 2013-08-18 DIAGNOSIS — D649 Anemia, unspecified: Secondary | ICD-10-CM

## 2013-08-18 DIAGNOSIS — Z8543 Personal history of malignant neoplasm of ovary: Secondary | ICD-10-CM

## 2013-08-18 DIAGNOSIS — I5032 Chronic diastolic (congestive) heart failure: Secondary | ICD-10-CM | POA: Diagnosis present

## 2013-08-18 DIAGNOSIS — I2510 Atherosclerotic heart disease of native coronary artery without angina pectoris: Secondary | ICD-10-CM | POA: Diagnosis present

## 2013-08-18 DIAGNOSIS — I6350 Cerebral infarction due to unspecified occlusion or stenosis of unspecified cerebral artery: Principal | ICD-10-CM | POA: Diagnosis present

## 2013-08-18 DIAGNOSIS — J449 Chronic obstructive pulmonary disease, unspecified: Secondary | ICD-10-CM | POA: Diagnosis present

## 2013-08-18 DIAGNOSIS — Z951 Presence of aortocoronary bypass graft: Secondary | ICD-10-CM

## 2013-08-18 DIAGNOSIS — I252 Old myocardial infarction: Secondary | ICD-10-CM

## 2013-08-18 DIAGNOSIS — K922 Gastrointestinal hemorrhage, unspecified: Secondary | ICD-10-CM | POA: Diagnosis present

## 2013-08-18 MED ORDER — LIDOCAINE HCL (CARDIAC) 20 MG/ML IV SOLN
INTRAVENOUS | Status: AC
  Filled 2013-08-18: qty 5

## 2013-08-18 MED ORDER — ETOMIDATE 2 MG/ML IV SOLN
INTRAVENOUS | Status: AC
  Filled 2013-08-18: qty 20

## 2013-08-18 MED ORDER — ROCURONIUM BROMIDE 50 MG/5ML IV SOLN
INTRAVENOUS | Status: AC
  Filled 2013-08-18: qty 2

## 2013-08-18 MED ORDER — SUCCINYLCHOLINE CHLORIDE 20 MG/ML IJ SOLN
INTRAMUSCULAR | Status: AC
  Filled 2013-08-18: qty 1

## 2013-08-18 NOTE — Telephone Encounter (Signed)
Mailed letter about missing her OV and [facility] OV for 6/25 at 11 with [facility] and appt card mailed

## 2013-08-18 NOTE — Telephone Encounter (Signed)
Pt was a no show

## 2013-08-18 NOTE — Telephone Encounter (Signed)
Needs appointment in mid-June to set up f/u EGD.

## 2013-08-19 ENCOUNTER — Inpatient Hospital Stay (HOSPITAL_COMMUNITY): Payer: Medicare Other

## 2013-08-19 ENCOUNTER — Encounter (HOSPITAL_COMMUNITY): Payer: Self-pay | Admitting: Radiology

## 2013-08-19 ENCOUNTER — Encounter (HOSPITAL_COMMUNITY)
Admission: EM | Disposition: A | Discharge status: Home Health | Payer: Self-pay | Source: Home / Self Care | Attending: Neurology

## 2013-08-19 ENCOUNTER — Inpatient Hospital Stay (HOSPITAL_COMMUNITY): Payer: Medicare Other | Admitting: Certified Registered Nurse Anesthetist

## 2013-08-19 ENCOUNTER — Emergency Department (HOSPITAL_COMMUNITY): Payer: Medicare Other

## 2013-08-19 ENCOUNTER — Encounter (HOSPITAL_COMMUNITY): Payer: Medicare Other | Admitting: Certified Registered Nurse Anesthetist

## 2013-08-19 DIAGNOSIS — I6350 Cerebral infarction due to unspecified occlusion or stenosis of unspecified cerebral artery: Principal | ICD-10-CM

## 2013-08-19 DIAGNOSIS — J9600 Acute respiratory failure, unspecified whether with hypoxia or hypercapnia: Secondary | ICD-10-CM

## 2013-08-19 DIAGNOSIS — I059 Rheumatic mitral valve disease, unspecified: Secondary | ICD-10-CM

## 2013-08-19 DIAGNOSIS — D62 Acute posthemorrhagic anemia: Secondary | ICD-10-CM

## 2013-08-19 DIAGNOSIS — Z8673 Personal history of transient ischemic attack (TIA), and cerebral infarction without residual deficits: Secondary | ICD-10-CM | POA: Diagnosis present

## 2013-08-19 HISTORY — PX: RADIOLOGY WITH ANESTHESIA: SHX6223

## 2013-08-19 LAB — COMPREHENSIVE METABOLIC PANEL
ALK PHOS: 76 U/L (ref 39–117)
ALT: 15 U/L (ref 0–35)
AST: 18 U/L (ref 0–37)
Albumin: 2.8 g/dL — ABNORMAL LOW (ref 3.5–5.2)
BUN: 17 mg/dL (ref 6–23)
CALCIUM: 8.9 mg/dL (ref 8.4–10.5)
CO2: 27 mEq/L (ref 19–32)
Chloride: 100 mEq/L (ref 96–112)
Creatinine, Ser: 4.98 mg/dL — ABNORMAL HIGH (ref 0.50–1.10)
GFR calc Af Amer: 9 mL/min — ABNORMAL LOW (ref >90)
GFR calc non Af Amer: 8 mL/min — ABNORMAL LOW (ref >90)
Glucose, Bld: 243 mg/dL — ABNORMAL HIGH (ref 70–99)
POTASSIUM: 4 meq/L (ref 3.7–5.3)
Sodium: 144 mEq/L (ref 137–147)
TOTAL PROTEIN: 7.5 g/dL (ref 6.0–8.3)
Total Bilirubin: 0.3 mg/dL (ref 0.3–1.2)

## 2013-08-19 LAB — DIFFERENTIAL
BASOS ABS: 0 10*3/uL (ref 0.0–0.1)
Basophils Relative: 1 % (ref 0–1)
EOS ABS: 0.1 10*3/uL (ref 0.0–0.7)
EOS PCT: 2 % (ref 0–5)
Lymphocytes Relative: 20 % (ref 12–46)
Lymphs Abs: 1.2 10*3/uL (ref 0.7–4.0)
Monocytes Absolute: 0.5 10*3/uL (ref 0.1–1.0)
Monocytes Relative: 9 % (ref 3–12)
NEUTROS PCT: 68 % (ref 43–77)
Neutro Abs: 4.1 10*3/uL (ref 1.7–7.7)

## 2013-08-19 LAB — POCT I-STAT 3, ART BLOOD GAS (G3+)
Acid-Base Excess: 1 mmol/L (ref 0.0–2.0)
BICARBONATE: 26.6 meq/L — AB (ref 20.0–24.0)
O2 Saturation: 95 %
PH ART: 7.388 (ref 7.350–7.450)
PO2 ART: 73 mmHg — AB (ref 80.0–100.0)
Patient temperature: 97.3
TCO2: 28 mmol/L (ref 0–100)
pCO2 arterial: 43.9 mmHg (ref 35.0–45.0)

## 2013-08-19 LAB — CBC WITH DIFFERENTIAL/PLATELET
Basophils Absolute: 0 10*3/uL (ref 0.0–0.1)
Basophils Relative: 0 % (ref 0–1)
Eosinophils Absolute: 0 10*3/uL (ref 0.0–0.7)
Eosinophils Relative: 0 % (ref 0–5)
HCT: 28.1 % — ABNORMAL LOW (ref 36.0–46.0)
Hemoglobin: 8.6 g/dL — ABNORMAL LOW (ref 12.0–15.0)
LYMPHS ABS: 0.5 10*3/uL — AB (ref 0.7–4.0)
LYMPHS PCT: 10 % — AB (ref 12–46)
MCH: 31.5 pg (ref 26.0–34.0)
MCHC: 30.6 g/dL (ref 30.0–36.0)
MCV: 102.9 fL — ABNORMAL HIGH (ref 78.0–100.0)
Monocytes Absolute: 0.3 10*3/uL (ref 0.1–1.0)
Monocytes Relative: 6 % (ref 3–12)
NEUTROS ABS: 4.1 10*3/uL (ref 1.7–7.7)
NEUTROS PCT: 83 % — AB (ref 43–77)
PLATELETS: 212 10*3/uL (ref 150–400)
RBC: 2.73 MIL/uL — AB (ref 3.87–5.11)
RDW: 19.1 % — ABNORMAL HIGH (ref 11.5–15.5)
WBC: 4.9 10*3/uL (ref 4.0–10.5)

## 2013-08-19 LAB — I-STAT TROPONIN, ED: Troponin i, poc: 0.17 ng/mL (ref 0.00–0.08)

## 2013-08-19 LAB — CBC
HCT: 35.1 % — ABNORMAL LOW (ref 36.0–46.0)
Hemoglobin: 10.3 g/dL — ABNORMAL LOW (ref 12.0–15.0)
MCH: 30.7 pg (ref 26.0–34.0)
MCHC: 29.3 g/dL — ABNORMAL LOW (ref 30.0–36.0)
MCV: 104.8 fL — AB (ref 78.0–100.0)
Platelets: 257 10*3/uL (ref 150–400)
RBC: 3.35 MIL/uL — ABNORMAL LOW (ref 3.87–5.11)
RDW: 19.1 % — AB (ref 11.5–15.5)
WBC: 5.9 10*3/uL (ref 4.0–10.5)

## 2013-08-19 LAB — I-STAT CHEM 8, ED
BUN: 16 mg/dL (ref 6–23)
CALCIUM ION: 1.07 mmol/L — AB (ref 1.13–1.30)
Chloride: 101 mEq/L (ref 96–112)
Creatinine, Ser: 5.2 mg/dL — ABNORMAL HIGH (ref 0.50–1.10)
Glucose, Bld: 247 mg/dL — ABNORMAL HIGH (ref 70–99)
HEMATOCRIT: 37 % (ref 36.0–46.0)
Hemoglobin: 12.6 g/dL (ref 12.0–15.0)
POTASSIUM: 3.7 meq/L (ref 3.7–5.3)
SODIUM: 142 meq/L (ref 137–147)
TCO2: 30 mmol/L (ref 0–100)

## 2013-08-19 LAB — GLUCOSE, CAPILLARY
Glucose-Capillary: 195 mg/dL — ABNORMAL HIGH (ref 70–99)
Glucose-Capillary: 150 mg/dL — ABNORMAL HIGH (ref 70–99)
Glucose-Capillary: 113 mg/dL — ABNORMAL HIGH (ref 70–99)
Glucose-Capillary: 105 mg/dL — ABNORMAL HIGH (ref 70–99)

## 2013-08-19 LAB — LIPID PANEL
Cholesterol: 109 mg/dL (ref 0–200)
HDL: 51 mg/dL (ref >39)
LDL CALC: 42 mg/dL (ref 0–99)
Total CHOL/HDL Ratio: 2.1 RATIO
Triglycerides: 79 mg/dL (ref <150)
VLDL: 16 mg/dL (ref 0–40)

## 2013-08-19 LAB — BASIC METABOLIC PANEL
BUN: 20 mg/dL (ref 6–23)
CHLORIDE: 101 meq/L (ref 96–112)
CO2: 24 mEq/L (ref 19–32)
Calcium: 8.3 mg/dL — ABNORMAL LOW (ref 8.4–10.5)
Creatinine, Ser: 5.17 mg/dL — ABNORMAL HIGH (ref 0.50–1.10)
GFR calc Af Amer: 9 mL/min — ABNORMAL LOW (ref >90)
GFR, EST NON AFRICAN AMERICAN: 8 mL/min — AB (ref >90)
GLUCOSE: 244 mg/dL — AB (ref 70–99)
POTASSIUM: 4 meq/L (ref 3.7–5.3)
SODIUM: 142 meq/L (ref 137–147)

## 2013-08-19 LAB — CBG MONITORING, ED
GLUCOSE-CAPILLARY: 209 mg/dL — AB (ref 70–99)
Glucose-Capillary: 216 mg/dL — ABNORMAL HIGH (ref 70–99)

## 2013-08-19 LAB — PROTIME-INR
INR: 1.09 (ref 0.00–1.49)
PROTHROMBIN TIME: 13.9 s (ref 11.6–15.2)

## 2013-08-19 LAB — TROPONIN I: Troponin I: <0.3 ng/mL (ref <0.30)

## 2013-08-19 LAB — TRIGLYCERIDES: Triglycerides: 76 mg/dL (ref <150)

## 2013-08-19 LAB — APTT: aPTT: 28 seconds (ref 24–37)

## 2013-08-19 LAB — HEMOGLOBIN A1C
Hgb A1c MFr Bld: 6.7 % — ABNORMAL HIGH (ref <5.7)
Mean Plasma Glucose: 146 mg/dL — ABNORMAL HIGH (ref <117)

## 2013-08-19 LAB — I-STAT CG4 LACTIC ACID, ED: LACTIC ACID, VENOUS: 1.27 mmol/L (ref 0.5–2.2)

## 2013-08-19 LAB — MRSA PCR SCREENING: MRSA BY PCR: NEGATIVE

## 2013-08-19 SURGERY — RADIOLOGY WITH ANESTHESIA
Anesthesia: General

## 2013-08-19 MED ORDER — ACETAMINOPHEN 650 MG RE SUPP: 650.0000 mg | RECTAL | Status: DC | PRN

## 2013-08-19 MED ORDER — PROPOFOL 10 MG/ML IV EMUL
INTRAVENOUS | Status: AC
  Administered 2013-08-19: 1000 mg
  Filled 2013-08-19: qty 100

## 2013-08-19 MED ORDER — PROPOFOL 10 MG/ML IV BOLUS
INTRAVENOUS | Status: DC | PRN
  Administered 2013-08-19: 110 mg via INTRAVENOUS
  Administered 2013-08-19: 50 mg via INTRAVENOUS

## 2013-08-19 MED ORDER — PANTOPRAZOLE SODIUM 40 MG IV SOLR: 40.0000 mg | Freq: Every day | INTRAVENOUS | Status: DC

## 2013-08-19 MED ORDER — MIDAZOLAM HCL 2 MG/2ML IJ SOLN
INTRAMUSCULAR | Status: AC
  Filled 2013-08-19: qty 2

## 2013-08-19 MED ORDER — FENTANYL CITRATE 0.05 MG/ML IJ SOLN
INTRAMUSCULAR | Status: AC
  Filled 2013-08-19: qty 5

## 2013-08-19 MED ORDER — CEFAZOLIN SODIUM-DEXTROSE 2-3 GM-% IV SOLR
2.0000 g | Freq: Once | INTRAVENOUS | Status: AC
  Administered 2013-08-19: 2 g via INTRAVENOUS

## 2013-08-19 MED ORDER — ROCURONIUM BROMIDE 100 MG/10ML IV SOLN
INTRAVENOUS | Status: DC | PRN
  Administered 2013-08-19: 40 mg via INTRAVENOUS

## 2013-08-19 MED ORDER — SODIUM CHLORIDE 0.9 % IV SOLN
INTRAVENOUS | Status: DC
  Administered 2013-08-19: 02:00:00 via INTRAVENOUS

## 2013-08-19 MED ORDER — SUCCINYLCHOLINE CHLORIDE 20 MG/ML IJ SOLN
INTRAMUSCULAR | Status: DC | PRN
  Administered 2013-08-19: 100 mg via INTRAVENOUS

## 2013-08-19 MED ORDER — INSULIN ASPART 100 UNIT/ML ~~LOC~~ SOLN
2.0000 [IU] | SUBCUTANEOUS | Status: DC
  Administered 2013-08-19: 4 [IU] via SUBCUTANEOUS
  Administered 2013-08-19: 2 [IU] via SUBCUTANEOUS

## 2013-08-19 MED ORDER — ARTIFICIAL TEARS OP OINT
TOPICAL_OINTMENT | OPHTHALMIC | Status: DC | PRN
  Administered 2013-08-19: 1 via OPHTHALMIC

## 2013-08-19 MED ORDER — ACETAMINOPHEN 500 MG PO TABS
1000.0000 mg | ORAL_TABLET | Freq: Four times a day (QID) | ORAL | Status: DC | PRN
Start: 2013-08-19 — End: 2013-08-19

## 2013-08-19 MED ORDER — LIDOCAINE HCL (CARDIAC) 20 MG/ML IV SOLN
INTRAVENOUS | Status: DC | PRN
  Administered 2013-08-19: 70 mg via INTRAVENOUS

## 2013-08-19 MED ORDER — PANTOPRAZOLE SODIUM 40 MG IV SOLR
40.0000 mg | Freq: Two times a day (BID) | INTRAVENOUS | Status: DC
  Administered 2013-08-19 – 2013-08-20 (×4): 40 mg via INTRAVENOUS
  Filled 2013-08-19 (×7): qty 40

## 2013-08-19 MED ORDER — IOHEXOL 350 MG/ML SOLN
50.0000 mL | Freq: Once | INTRAVENOUS | Status: AC | PRN
  Administered 2013-08-19: 50 mL via INTRAVENOUS

## 2013-08-19 MED ORDER — BIOTENE DRY MOUTH MT LIQD
15.0000 mL | Freq: Four times a day (QID) | OROMUCOSAL | Status: DC
  Administered 2013-08-19 – 2013-08-22 (×14): 15 mL via OROMUCOSAL

## 2013-08-19 MED ORDER — IOHEXOL 300 MG/ML  SOLN
150.0000 mL | Freq: Once | INTRAMUSCULAR | Status: AC | PRN
  Administered 2013-08-19: 80 mL via INTRAVENOUS

## 2013-08-19 MED ORDER — NICARDIPINE HCL IN NACL 20-0.86 MG/200ML-% IV SOLN
5.0000 mg/h | INTRAVENOUS | Status: DC
  Administered 2013-08-19: 4 mg/h via INTRAVENOUS
  Administered 2013-08-19 (×3): 5 mg/h via INTRAVENOUS
  Administered 2013-08-19: 4 mg/h via INTRAVENOUS
  Administered 2013-08-20 (×2): 5 mg/h via INTRAVENOUS
  Administered 2013-08-20 (×2): 4 mg/h via INTRAVENOUS
  Filled 2013-08-19 (×9): qty 200

## 2013-08-19 MED ORDER — ONDANSETRON HCL 4 MG/2ML IJ SOLN: 4.0000 mg | Freq: Four times a day (QID) | INTRAMUSCULAR | Status: DC | PRN

## 2013-08-19 MED ORDER — CEFAZOLIN SODIUM-DEXTROSE 2-3 GM-% IV SOLR
INTRAVENOUS | Status: AC
  Filled 2013-08-19: qty 50

## 2013-08-19 MED ORDER — SODIUM CHLORIDE 0.9 % IV SOLN
INTRAVENOUS | Status: DC
  Administered 2013-08-19 – 2013-08-22 (×2): via INTRAVENOUS

## 2013-08-19 MED ORDER — CHLORHEXIDINE GLUCONATE 0.12 % MT SOLN
15.0000 mL | Freq: Two times a day (BID) | OROMUCOSAL | Status: DC
  Administered 2013-08-19 – 2013-08-22 (×6): 15 mL via OROMUCOSAL
  Filled 2013-08-19 (×6): qty 15

## 2013-08-19 MED ORDER — PROPOFOL 10 MG/ML IV BOLUS
INTRAVENOUS | Status: AC
  Filled 2013-08-19: qty 20

## 2013-08-19 MED ORDER — ALTEPLASE (STROKE) FULL DOSE INFUSION
0.9000 mg/kg | Freq: Once | INTRAVENOUS | Status: AC
  Administered 2013-08-19: 67 mg via INTRAVENOUS
  Filled 2013-08-19: qty 67

## 2013-08-19 MED ORDER — PROPOFOL 10 MG/ML IV EMUL
0.0000 ug/kg/min | INTRAVENOUS | Status: DC
  Administered 2013-08-19: 50 ug/kg/min via INTRAVENOUS

## 2013-08-19 MED ORDER — PROPOFOL INFUSION 10 MG/ML OPTIME
INTRAVENOUS | Status: DC | PRN
  Administered 2013-08-19: 50 ug/kg/min via INTRAVENOUS

## 2013-08-19 MED ORDER — ALTEPLASE 30 MG/30 ML FOR INTERV. RAD
1.0000 mg | INTRA_ARTERIAL | Status: AC | PRN
  Administered 2013-08-19 (×3): 5 mg via INTRA_ARTERIAL
  Filled 2013-08-19: qty 30

## 2013-08-19 MED ORDER — DEXTROSE 5 % IV SOLN
INTRAVENOUS | Status: DC | PRN
  Administered 2013-08-19: 04:00:00 via INTRAVENOUS

## 2013-08-19 MED ORDER — ACETAMINOPHEN 325 MG PO TABS: 650.0000 mg | ORAL_TABLET | ORAL | Status: DC | PRN

## 2013-08-19 MED ORDER — FENTANYL CITRATE 0.05 MG/ML IJ SOLN
INTRAMUSCULAR | Status: DC | PRN
  Administered 2013-08-19 (×3): 50 ug via INTRAVENOUS
  Administered 2013-08-19: 100 ug via INTRAVENOUS

## 2013-08-19 MED ORDER — ACETAMINOPHEN 650 MG RE SUPP: 650.0000 mg | Freq: Four times a day (QID) | RECTAL | Status: DC | PRN

## 2013-08-19 MED ORDER — LABETALOL HCL 5 MG/ML IV SOLN: 10.0000 mg | INTRAVENOUS | Status: DC | PRN

## 2013-08-19 NOTE — Procedures (Signed)
S/P lt common carotid arteriogram followed by near complete revascularization of lt MCA M2-M 3branches of superior division of LT MCA with 15 mg of superselective IA TPA

## 2013-08-19 NOTE — Progress Notes (Signed)
Stroke Team Progress Note  HISTORY [name] is a 68 y.o. female who was last seen normal around 10:30 at night 08/18/2013 when she had some slurred speech. She subsequently resolved, but then as they're getting into bed again she had garbled speech and he decided to bring her into the emergency room. She was brought into the ER, and in the ER she was noticed to get significantly worse and became unresponsive for a brief period of time. She became globally aphasic and there is concern briefly that she was not protecting her airway, but her mental status once again improved to the point where she was not intubated.   She had open heart surgery done in early March, she had some GI bleeding done in early April which was cauterized. Husband reports no issues with bleeding since that time and her hemoglobin here is significantly higher than at discharge. She undergoes hemodialysis, but does have a peritoneal dialysis catheter in place. The need for heart surgery in March was apparently found on evaluation for kidney transplant surgery and this surgery was undertaken in anticipation of kidney transplantation.   CTA demonstrated L M2 occlusion. Patient was administered TPA. She was taken to neuro intervention where she had near complete revascularization of L MCA M2-M3 branches of the superior division of L MCA with IA tPA.  She was admitted to the neuro ICU for further evaluation and treatment.  SUBJECTIVE No family is at the bedside.    OBJECTIVE Most recent Vital Signs: Filed Vitals:   08/19/13 0615 08/19/13 0630 08/19/13 0700 08/19/13 0741  BP: 138/53 150/63 142/52   Pulse: 57 58 59 64  Temp:    97.3 F (36.3 C)  TempSrc:    Oral  Resp: 16 16 16 15   Weight:      SpO2: 100% 100% 100% 100%   CBG (last 3)   Recent Labs  08/19/13 0017 08/19/13 0134 08/19/13 0804  GLUCAP 216* 209* 195*    IV Fluid Intake:   . sodium chloride 75 mL/hr at 08/19/13 0151  . sodium chloride 30 mL/hr at  08/19/13 0552  . niCARDipine 5 mg/hr (08/19/13 0552)  . propofol Stopped (08/19/13 0725)    MEDICATIONS  . ceFAZolin      . etomidate      . insulin aspart  2-6 Units Subcutaneous 6 times per day  . lidocaine (cardiac) 100 mg/84ml      . pantoprazole (PROTONIX) IV  40 mg Intravenous QHS  . rocuronium      . succinylcholine       PRN:  acetaminophen, acetaminophen, acetaminophen, acetaminophen, labetalol, ondansetron (ZOFRAN) IV  Diet:  NPO  Activity:  Bedrest DVT Prophylaxis:  SCDs   CLINICALLY SIGNIFICANT STUDIES Basic Metabolic Panel:   Recent Labs Lab 08/19/13 0003 08/19/13 0020 08/19/13 0554  NA 144 142 142  K 4.0 3.7 4.0  CL 100 101 101  CO2 27  --  24  GLUCOSE 243* 247* 244*  BUN 17 16 20   CREATININE 4.98* 5.20* 5.17*  CALCIUM 8.9  --  8.3*   Liver Function Tests:   Recent Labs Lab 08/19/13 0003  AST 18  ALT 15  ALKPHOS 76  BILITOT 0.3  PROT 7.5  ALBUMIN 2.8*   CBC:   Recent Labs Lab 08/19/13 0003 08/19/13 0020 08/19/13 0554  WBC 5.9  --  4.9  NEUTROABS 4.1  --  4.1  HGB 10.3* 12.6 8.6*  HCT 35.1* 37.0 28.1*  MCV 104.8*  --  102.9*  PLT 257  --  212   Coagulation:   Recent Labs Lab 08/19/13 0003  LABPROT 13.9  INR 1.09   Cardiac Enzymes:   Recent Labs Lab 08/19/13 0554  TROPONINI <0.30   Urinalysis: No results found for this basename: COLORURINE, APPERANCEUR, LABSPEC, PHURINE, GLUCOSEU, HGBUR, BILIRUBINUR, KETONESUR, PROTEINUR, UROBILINOGEN, NITRITE, LEUKOCYTESUR,  in the last 168 hours Lipid Panel    Component Value Date/Time   CHOL 109 08/19/2013 0554   TRIG 79 08/19/2013 0554   HDL 51 08/19/2013 0554   CHOLHDL 2.1 08/19/2013 0554   VLDL 16 08/19/2013 0554   LDLCALC 42 08/19/2013 0554   HgbA1C  Lab Results  Component Value Date   HGBA1C 5.6 07/01/2013    Urine Drug Screen:   No results found for this basename: labopia,  cocainscrnur,  labbenz,  amphetmu,  thcu,  labbarb    Alcohol Level: No results found for this  basename: ETH,  in the last 168 hours    CT of the brain   08/19/2013    1. New 1.2 cm hyperdensity within the left parafalcine gray matter of the left parietal lobe. Finding is concerning for possible new subarachnoid/intraparenchymal hemorrhage. 2. Increased hyperdensity along the falx and left tentorium. While this finding may be related to recent procedure, possible acute subdural hemorrhage could also have this appearance. A close interval followup CT would likely be helpful to further evaluate these findings.   08/19/2013    1. No acute intracranial pathology seen on CT. 2. Scattered small vessel ischemic microangiopathy.   CT Angio Head  08/19/2013    1. Occlusion of a left M2 branch supplying the anterior division of the left MCA artery territory as above. 2. No other proximal branch occlusion or high-grade flow-limiting stenosis identified within the anterior circulation.   CT Angio Neck 08/19/2013   1. Atherosclerotic plaque at the mid origin of the right internal carotid artery with associated short segment stenosis of approximately 50% by NASCET criteria. 2. Atherosclerotic plaque at the origin of the left internal carotid artery with associated stenosis of approximately 30-40% by NASCET criteria. 3. No high-grade stenosis or vascular occlusion identified within the neck. 4. Moderate bilateral pleural effusions, right greater than left. 5. Patchy and ground-glass opacity within the partially visualized upper lobes, right greater than left. Findings are indeterminate, and may represent edema and/or infection. Clinical correlation with dedicated chest radiograph recommended. 6. Left supraclavicular adenopathy as above, of uncertain etiology, and may be reactive in nature.   Cerebral Angio 08/19/2013 S/P lt common carotid arteriogram followed by near complete revascularization of lt MCA M2-M 3branches of superior division of LT MCA with 15 mg of superselective IA TPA   MRI of the brain    2D  Echocardiogram    CXR  08/19/2013   CHF with bibasilar effusions and atelectasis, unable to exclude consolidation in LEFT lower lobe.  EKG  atrial fibrillation, rate 76. For complete results please see formal report.   Therapy Recommendations   Physical Exam   Middle aged 50 lady intubated.biltateral groin sheaths.Awake alert. Afebrile. Head is nontraumatic. Neck is supple without bruit. Hearing is normal. Cardiac exam no murmur or gallop. Lungs are clear to auscultation. Distal pulses are well felt. Neurological Exam : Awake alert and interactive. Follows simple midline and one-step commands well. Pupils equal reactive. Vision acuity and feels inadequate. Extraocular moments are full range without nystagmus. Face is symmetric. Tongue is midline. Able to move both upper extremities well against gravity  left and greater than right. Bilateral groin sheaths  limits for lower extremity exam but no focal weakness. ASSESSMENT [name] is a 68 y.o. female presenting with slurred speech that progressed to global aphasia with momentary loss of consciousness and right hemirparesis. Status post IV t-PA 08/19/2013 at 0052 followed by near complete revascularization of L MCA M2-M3 branches of the superior division of L MCA with IA tPA. Imaging pending. Suspect a left MCA infarct. CT does show left parafalcine abnormality, suspicious for hemorrhage vs contrast post intervention.  Infarct likely embolic secondary to atrial fibrillation see on EKG.  On no antithrombotics prior to admission. Now on no antithrombotics as within 24h of tPA for secondary stroke prevention. Patient with resultant VDRF, right hemiparesis, dysphagia. Stroke work up underway.  Acute respiratory failure, CCM following  atrial fibrillation per EKG, new finding Hypertension. On cardene for post intervention BP goal 120-140 per Dr. Estanislado Pandy Hyperlipidemia, LDL 42, on zocor 40 PTA, now on no statin as NPO, goal LDL < 70 for  diabetics Diabetes, HgbA1c 5.6, goal < 7.0 CAD - MI Hx stroke 2010, no residual deficits  ESRD, on hemodialysis Tues, Thurs and Sat. CCM to consult renal. Has peritoneal cath. Hx ovarian cancer, 1995, Progress, in remission Known post CABG L leg wound with dressing  Hospital day # 1  TREATMENT/PLAN  Plan to add aspirin for secondary stroke prevention once post tPA imaging confirms no hemorrhage. Consider anticoagulation given atrial fibrillation.   D/c sheaths  Imaging prior to extubation  CCM to manage medical issues, renal consult  SIGNED Burnetta Sabin, MSN, RN, ANVP-BC, ANP-BC, GNP-BC [facility] Pager: [phone] 08/19/2013 3:32 PM This patient is critically ill and at significant risk of neurological worsening, death and care requires constant monitoring of vital signs, hemodynamics,respiratory and cardiac monitoring,review of multiple databases, neurological assessment, discussion with family, other specialists and medical decision making of high complexity. I spent 30 minutes of neurocritical care time  in the care of  this patient.  I have personally obtained a history, examined the patient, evaluated imaging results, and formulated the assessment and plan of care. I agree with the above.  Antony Contras, MD  To contact Stroke Continuity provider, please refer to [email or web address] After hours, contact General Neurology

## 2013-08-19 NOTE — ED Notes (Signed)
Pt in CT1.

## 2013-08-19 NOTE — Consult Note (Signed)
WOC wound consult note Reason for Consult: leg wound s/p coronary artery bypass surgery in March 2015.  She has open wound on the left lower medial calf.  Also requested to evaluate peritoneal dialysis catheter dressing. This is not a dressing that the [facility] nurses perform, however I have contacted the [facility] and they do have staff with competency in this sterile dressing.  It is unclear when this was last changed and her husband is not in the room . Will need to have renal unit RN change PD sterile dressing weekly.  Wound type: Non healing surgical wound LLE Measurement: 1.5cm x 1.0cm x 2.5cm tunnel at 12 o'clock  Wound OAC:ZYSAY, pink, moist Drainage (amount, consistency, odor) minimal, serous drainage Periwound:intact Dressing procedure/placement/frequency: iodoform packing strip to wick wound drainage to foam topper. Change packing strip daily, ok to leave foam topper in place and only change weekly and PRN soilage. Will contact [facility] for PD catheter dressing change.  Discussed POC with patient and bedside nurse.  Re consult if needed, will not follow at this time. Thanks  Calyb Mcquarrie [place], [place] [phone])

## 2013-08-19 NOTE — Progress Notes (Signed)
SBT began on patient at 0915 per MD order. Within 3 minutes pt sats dropped to 85%, VE dropped to 2.0 and VT only 100. Pt returned to full support at 0918 with increased rate to 50%. Pt sats normal now with decreased FIO2 back to 40%. RT will try to wean again.

## 2013-08-19 NOTE — Sedation Documentation (Signed)
Arrival to IR 2.  CRNA present.  Pt follows commands, squeeze on right hand and right leg movement, no left side movement.  Blowing respirations.  Report received from ED RN.  Gaze to right side. Prepping pt for angiogram.

## 2013-08-19 NOTE — ED Notes (Signed)
Bleeding around the angiocath lt hand.  Bandaged sl pressure bandage

## 2013-08-19 NOTE — Code Documentation (Signed)
68 yo wf brought in via pvt vehicle to triage by her husband for sudden onset slurred speech & Rt side weakness.  Per the pt's husband around 2230 she told him they needed to go to bed due to an early am MD appt at [facility].  As they were getting in to bed he noticed she had garbled speech & decided to bring her to the ED.  On arrival to the ED pt's condition deteriorated & she became aphasic & developed agonal respirations.  She was prepped for intubation but began to respond & intubation deferred.  See doc flowsheets for tPA times.

## 2013-08-19 NOTE — ED Notes (Signed)
Pt from triage, husband reports about 30 mins ago pt developed right sided weakness and dysphagia and "TIA like symptoms." he drove her here to [facility] and pt is unresponsive to sternal rub, wandering gaze. Dialysis pt. Dr. Florina Ou at bedside.

## 2013-08-19 NOTE — Progress Notes (Signed)
Subjective: Patient presented with slurred speech and right sided weakness. Imaging revealed left MCA stroke s/p left common carotid arteriogram followed by near complete revascularization of lt MCA M2-M 3branches of superior division of LT MCA with 15 mg of superselective IA TPA. Patient is intubated and no family in room time of exam. Per RN once sedation was turned off, the patient would move her right side to command.  Objective: Physical Exam: BP 151/53  Pulse 72  Temp(Src) 97.4 F (36.3 C) (Oral)  Resp 16  Wt 163 lb (73.936 kg)  SpO2 98%  General: Awake, intubated, tracking with eyes and following verbal commands Abd: Soft, NT, ND, (+) BS Ext: bilateral sheaths intact without active bleeding or hematoma, DP intact bilaterally 1+ Neuro: Tracking with eyes, follows commands, moves RUE and RLE to command, left full movement.   Labs: CBC  Recent Labs  08/19/13 0003 08/19/13 0020 08/19/13 0554  WBC 5.9  --  4.9  HGB 10.3* 12.6 8.6*  HCT 35.1* 37.0 28.1*  PLT 257  --  212   BMET  Recent Labs  08/19/13 0003 08/19/13 0020 08/19/13 0554  NA 144 142 142  K 4.0 3.7 4.0  CL 100 101 101  CO2 27  --  24  GLUCOSE 243* 247* 244*  BUN 17 16 20   CREATININE 4.98* 5.20* 5.17*  CALCIUM 8.9  --  8.3*   LFT  Recent Labs  08/19/13 0003  PROT 7.5  ALBUMIN 2.8*  AST 18  ALT 15  ALKPHOS 76  BILITOT 0.3   PT/INR  Recent Labs  08/19/13 0003  LABPROT 13.9  INR 1.09     Studies/Results: Ct Angio Head W/cm &/or Wo Cm  08/19/2013   CLINICAL DATA:  Code stroke. Concern for left MCA territory infarct.  EXAM: CT ANGIOGRAPHY HEAD AND NECK  TECHNIQUE: Multidetector CT imaging of the head and neck was performed using the standard protocol during bolus administration of intravenous contrast. Multiplanar CT image reconstructions and MIPs were obtained to evaluate the vascular anatomy. Carotid stenosis measurements (when applicable) are obtained utilizing NASCET criteria, using the  distal internal carotid diameter as the denominator.  CONTRAST:  44mL OMNIPAQUE IOHEXOL 350 MG/ML SOLN  COMPARISON:  Prior CT performed earlier on the same day as well as prior MRI from 08/20/2010.  FINDINGS: CTA HEAD FINDINGS  The visualized petrous segments of the internal carotid arteries are well opacified bilaterally. Scattered calcified plaque seen within the cavernous segments bilaterally without definite high-grade flow-limiting stenosis. The supra clinoid segments of the internal carotid arteries are well opacified.  Mild multi focal atheromatous irregularity noted within the left M1 segment. No proximal branch occlusion or high-grade flow-limiting stenosis. There is occlusion of an anterior left M2 branch with absence of flow within the distal MCA branches of the anterior division of the left MCA artery (series 409, image 240). Occlusion occurs within the sylvian fissure on image 233. Flow is seen within the posterior division of the MCA territory.  The right M1 segment is well opacified without proximal branch occlusion or high-grade flow-limiting stenosis. Distal right MCA branches are not within normal limits.  A1 segments are symmetric bilaterally. Anterior communicating artery is normal. Anterior cerebral arteries are well opacified.  The vertebral arteries are codominant. Posterior inferior cerebellar arteries are within normal limits. Vertebrobasilar junction and basilar artery are well opacified. Posterior cerebral arteries are within normal limits. Superior cerebellar arteries are normal. Fetal origin of the left PCA noted. Mild multi focal irregularity involving  the distal left PCA artery noted, similar to prior.  No aneurysm seen within the intracranial circulation.  Review of the MIP images confirms the above findings.  CTA NECK FINDINGS  The visualize aortic arch is of normal caliber with normal 3 vessel morphology. Scattered atherosclerotic plaques noted within the aortic arch. No high-grade  proximal stenosis seen at the origin of the great vessels. There is calcified and noncalcified plaque within the proximal left subclavian artery with associated approximately 30-40% of stenosis (series 409, image 37). The left subclavian artery is well opacified distally. The right subclavian artery is within normal limits.  The common carotid arteries are of symmetric caliber bilaterally without evidence of inclusion or dissection. Calcified atheromatous plaque present within the mid left common carotid artery without associated high-grade stenosis by NASCET criteria (series 409, image 80). Heavy atherosclerotic plaque noted about the carotid bifurcations bilaterally.  There is calcified and noncalcified plaque at the origin of the internal carotid arteries bilaterally, right worse than left. On the right, there is associated irregular short segment stenosis of approximately 50% by NASCET criteria. On the left, there is associated stenosis of approximately 30-40% by NASCET criteria. The internal carotid arteries are well opacified distally. Tortuosity of the right internal carotid artery noted.  External carotid arteries and their branches are within normal limits.  Vertebral arteries are of equal caliber without evidence of high-grade flow-limiting stenosis, occlusion, or dissection.  Moderate bilateral pleural effusions are partially visualized, right greater than left. There is patchy and ground-glass opacity within the partially visualized upper lobes, right greater than left. Findings are indeterminate, but may represent edema and/or infection.  Scattered mildly enlarged left-sided supraclavicular lymph nodes measuring up to 1 cm in short axis are present (series 409, image 55).  No acute osseous abnormality within the neck. Moderate multilevel degenerative changes noted within the visualized spine.  Review of the MIP images confirms the above findings.  IMPRESSION: CTA HEAD:  1. Occlusion of a left M2 branch  supplying the anterior division of the left MCA artery territory as above. 2. No other proximal branch occlusion or high-grade flow-limiting stenosis identified within the anterior circulation.  CTA NECK:  1. Atherosclerotic plaque at the mid origin of the right internal carotid artery with associated short segment stenosis of approximately 50% by NASCET criteria. 2. Atherosclerotic plaque at the origin of the left internal carotid artery with associated stenosis of approximately 30-40% by NASCET criteria. 3. No high-grade stenosis or vascular occlusion identified within the neck. 4. Moderate bilateral pleural effusions, right greater than left. 5. Patchy and ground-glass opacity within the partially visualized upper lobes, right greater than left. Findings are indeterminate, and may represent edema and/or infection. Clinical correlation with dedicated chest radiograph recommended. 6. Left supraclavicular adenopathy as above, of uncertain etiology, and may be reactive in nature. Critical Value/emergent results were called by telephone at the time of interpretation on 08/19/2013 at 1:05 AM to Dr. Roland Rack , who verbally acknowledged these results.   Electronically Signed   By: Jeannine Boga M.D.   On: 08/19/2013 01:48   Ct Head Wo Contrast  08/19/2013   CLINICAL DATA:  Post recannulization, tPA  EXAM: CT HEAD WITHOUT CONTRAST  TECHNIQUE: Contiguous axial images were obtained from the base of the skull through the vertex without intravenous contrast.  COMPARISON:  Prior CTA performed earlier on the same day.  FINDINGS: Atrophy with chronic microvascular ischemic changes are again seen.  A new 1.2 cm date hyperdensity is seen within  the left parafalcine [place] [employer] matter adjacent to the posterior falx and the left parietal lobe (series 2, image 22). Finding is suspicious for possible small acute parenchymal and/or subarachnoid hemorrhage. Additionally, there is increase hyperdensity along the falx and  left tentorium cerebella as compared to prior exam, suspicious for possible small volume acute subdural hemorrhage.  No definite acute large vessel territory infarct identified. No mass lesion or midline shift.  Calvarium is intact.  Orbits are normal.  Scattered opacity present within the ethmoidal air cells bilaterally. No mastoid effusion.  IMPRESSION: 1. New 1.2 cm hyperdensity within the left parafalcine gray matter of the left parietal lobe. Finding is concerning for possible new subarachnoid/intraparenchymal hemorrhage. 2. Increased hyperdensity along the falx and left tentorium. While this finding may be related to recent procedure, possible acute subdural hemorrhage could also have this appearance. A close interval followup CT would likely be helpful to further evaluate these findings.  Critical Value/emergent results were called by telephone at the time of interpretation on 08/19/2013 at 5:40 AM to Dr. Kathrynn Speed, who verbally acknowledged these results.   Electronically Signed   By: Jeannine Boga M.D.   On: 08/19/2013 05:46   Ct Head (brain) Wo Contrast  08/19/2013   CLINICAL DATA:  Right-sided weakness and dysphagia.  Unresponsive.  EXAM: CT HEAD WITHOUT CONTRAST  TECHNIQUE: Contiguous axial images were obtained from the base of the skull through the vertex without intravenous contrast.  COMPARISON:  CT of the head performed 08/19/2010, and MRI/MRA of the brain performed 08/20/2010  FINDINGS: There is no evidence of acute infarction, mass lesion, or intra- or extra-axial hemorrhage on CT.  Scattered periventricular and subcortical white matter change likely reflects small vessel ischemic microangiopathy.  The posterior fossa, including the cerebellum, brainstem and fourth ventricle, is within normal limits. The third and lateral ventricles, and basal ganglia are unremarkable in appearance. The cerebral hemispheres demonstrate grossly normal gray-white differentiation. No mass effect or  midline shift is seen.  There is no evidence of fracture; visualized osseous structures are unremarkable in appearance. The orbits are within normal limits. The paranasal sinuses and mastoid air cells are well-aerated. No significant soft tissue abnormalities are seen.  IMPRESSION: 1. No acute intracranial pathology seen on CT. 2. Scattered small vessel ischemic microangiopathy.  These results were called by telephone at the time of interpretation on 08/19/2013 at 12:25 AM to Dr. Orvilla Cornwall, who verbally acknowledged these results.   Electronically Signed   By: Garald Balding M.D.   On: 08/19/2013 00:26   Ct Angio Neck W/cm &/or Wo/cm  08/19/2013   CLINICAL DATA:  Code stroke. Concern for left MCA territory infarct.  EXAM: CT ANGIOGRAPHY HEAD AND NECK  TECHNIQUE: Multidetector CT imaging of the head and neck was performed using the standard protocol during bolus administration of intravenous contrast. Multiplanar CT image reconstructions and MIPs were obtained to evaluate the vascular anatomy. Carotid stenosis measurements (when applicable) are obtained utilizing NASCET criteria, using the distal internal carotid diameter as the denominator.  CONTRAST:  64mL OMNIPAQUE IOHEXOL 350 MG/ML SOLN  COMPARISON:  Prior CT performed earlier on the same day as well as prior MRI from 08/20/2010.  FINDINGS: CTA HEAD FINDINGS  The visualized petrous segments of the internal carotid arteries are well opacified bilaterally. Scattered calcified plaque seen within the cavernous segments bilaterally without definite high-grade flow-limiting stenosis. The supra clinoid segments of the internal carotid arteries are well opacified.  Mild multi focal atheromatous irregularity noted within the left M1  segment. No proximal branch occlusion or high-grade flow-limiting stenosis. There is occlusion of an anterior left M2 branch with absence of flow within the distal MCA branches of the anterior division of the left MCA artery (series 409,  image 240). Occlusion occurs within the sylvian fissure on image 233. Flow is seen within the posterior division of the MCA territory.  The right M1 segment is well opacified without proximal branch occlusion or high-grade flow-limiting stenosis. Distal right MCA branches are not within normal limits.  A1 segments are symmetric bilaterally. Anterior communicating artery is normal. Anterior cerebral arteries are well opacified.  The vertebral arteries are codominant. Posterior inferior cerebellar arteries are within normal limits. Vertebrobasilar junction and basilar artery are well opacified. Posterior cerebral arteries are within normal limits. Superior cerebellar arteries are normal. Fetal origin of the left PCA noted. Mild multi focal irregularity involving the distal left PCA artery noted, similar to prior.  No aneurysm seen within the intracranial circulation.  Review of the MIP images confirms the above findings.  CTA NECK FINDINGS  The visualize aortic arch is of normal caliber with normal 3 vessel morphology. Scattered atherosclerotic plaques noted within the aortic arch. No high-grade proximal stenosis seen at the origin of the great vessels. There is calcified and noncalcified plaque within the proximal left subclavian artery with associated approximately 30-40% of stenosis (series 409, image 37). The left subclavian artery is well opacified distally. The right subclavian artery is within normal limits.  The common carotid arteries are of symmetric caliber bilaterally without evidence of inclusion or dissection. Calcified atheromatous plaque present within the mid left common carotid artery without associated high-grade stenosis by NASCET criteria (series 409, image 80). Heavy atherosclerotic plaque noted about the carotid bifurcations bilaterally.  There is calcified and noncalcified plaque at the origin of the internal carotid arteries bilaterally, right worse than left. On the right, there is associated  irregular short segment stenosis of approximately 50% by NASCET criteria. On the left, there is associated stenosis of approximately 30-40% by NASCET criteria. The internal carotid arteries are well opacified distally. Tortuosity of the right internal carotid artery noted.  External carotid arteries and their branches are within normal limits.  Vertebral arteries are of equal caliber without evidence of high-grade flow-limiting stenosis, occlusion, or dissection.  Moderate bilateral pleural effusions are partially visualized, right greater than left. There is patchy and ground-glass opacity within the partially visualized upper lobes, right greater than left. Findings are indeterminate, but may represent edema and/or infection.  Scattered mildly enlarged left-sided supraclavicular lymph nodes measuring up to 1 cm in short axis are present (series 409, image 55).  No acute osseous abnormality within the neck. Moderate multilevel degenerative changes noted within the visualized spine.  Review of the MIP images confirms the above findings.  IMPRESSION: CTA HEAD:  1. Occlusion of a left M2 branch supplying the anterior division of the left MCA artery territory as above. 2. No other proximal branch occlusion or high-grade flow-limiting stenosis identified within the anterior circulation.  CTA NECK:  1. Atherosclerotic plaque at the mid origin of the right internal carotid artery with associated short segment stenosis of approximately 50% by NASCET criteria. 2. Atherosclerotic plaque at the origin of the left internal carotid artery with associated stenosis of approximately 30-40% by NASCET criteria. 3. No high-grade stenosis or vascular occlusion identified within the neck. 4. Moderate bilateral pleural effusions, right greater than left. 5. Patchy and ground-glass opacity within the partially visualized upper lobes, right greater than  left. Findings are indeterminate, and may represent edema and/or infection. Clinical  correlation with dedicated chest radiograph recommended. 6. Left supraclavicular adenopathy as above, of uncertain etiology, and may be reactive in nature. Critical Value/emergent results were called by telephone at the time of interpretation on 08/19/2013 at 1:05 AM to Dr. Roland Rack , who verbally acknowledged these results.   Electronically Signed   By: Jeannine Boga M.D.   On: 08/19/2013 02:20   Dg Chest Port 1 View  08/19/2013   CLINICAL DATA:  Cerebral infarction and respiratory failure.  EXAM: PORTABLE CHEST - 1 VIEW  COMPARISON:  08/19/2013  FINDINGS: The patient has been intubated with the endotracheal tube tip approximately 4.5 cm above the carina. Gastric decompression tube enters the proximal stomach. Lungs show stable diffuse interstitial edema/CHF with probable small bilateral pleural effusions and stable dense atelectasis at the left base.  IMPRESSION: Endotracheal tube placement with tip approximately 4.5 cm above the carina. Stable diffuse interstitial edema, left lower lobe consolidation and probable small bilateral pleural effusions.   Electronically Signed   By: Aletta Edouard M.D.   On: 08/19/2013 09:52   Dg Chest Port 1 View  08/19/2013   CLINICAL DATA:  Stroke, hypertension, diabetes, hyperlipidemia, coronary artery disease post MI, end-stage renal disease on dialysis, ovarian cancer  EXAM: PORTABLE CHEST - 1 VIEW  COMPARISON:  Portable exam 0122 hr compared to 06/27/2013  FINDINGS: Enlargement of cardiac silhouette post CABG and MVR.  Tortuous thoracic aorta with atherosclerotic calcification.  Pulmonary vascular congestion.  Diffuse interstitial infiltrates consistent with pulmonary edema and CHF.  Bibasilar effusions and atelectasis.  Unable to exclude coexistent consolidation in LEFT lower lobe.  No pneumothorax.  Bones unremarkable.  IMPRESSION: CHF with bibasilar effusions and atelectasis, unable to exclude consolidation in LEFT lower lobe.   Electronically Signed    By: Lavonia Dana M.D.   On: 08/19/2013 01:43    Assessment/Plan: Global aphasia with right sided weakness Acute left MCA infarct S/p left common carotid arteriogram followed by near complete revascularization of lt MCA M2-M 3branches of superior division of LT MCA with 15 mg of superselective IA TPA 5/28, sheaths intact without hematoma, to be removed today. BP stable.  CT head today concerning for possible new subarachnoid/intraparenchymal hemorrhage, will await MRI after extubation. Acute respiratory failure, on ventilator  CAD s/p CABG ESRD on HD History of GIB, monitor labs and vitals, on PPI    LOS: 1 day    Hedy Jacob PA-C 08/19/2013 1:48 PM

## 2013-08-19 NOTE — Anesthesia Procedure Notes (Signed)
Procedure Name: Intubation Date/Time: 08/19/2013 3:00 AM Performed by: Hollie Salk Z Pre-anesthesia Checklist: Patient identified, Patient being monitored, Timeout performed, Emergency Drugs available and Suction available Patient Re-evaluated:Patient Re-evaluated prior to inductionOxygen Delivery Method: Circle system utilized Preoxygenation: Pre-oxygenation with 100% oxygen Intubation Type: IV induction, Rapid sequence and Cricoid Pressure applied Laryngoscope Size: Mac and 3 Grade View: Grade I Tube type: Subglottic suction tube Tube size: 7.5 mm Number of attempts: 1 Airway Equipment and Method: Stylet Placement Confirmation: ETT inserted through vocal cords under direct vision,  breath sounds checked- equal and bilateral and positive ETCO2 Secured at: 22 cm Tube secured with: Tape Dental Injury: Teeth and Oropharynx as per pre-operative assessment

## 2013-08-19 NOTE — ED Notes (Signed)
To ir now

## 2013-08-19 NOTE — ED Notes (Signed)
tpa infused.  Ns added to iv at 75 ccnss

## 2013-08-19 NOTE — ED Notes (Signed)
Husband reports about one hour ago they were watching television when her tongue began to swell and she couldn't speak.  10 minutes later she was able to speak and said she felt fine and did not need to come to the hospital. Suddenly she had trouble speaking and couldn't respond to her husband. He drove her here to [facility] and unresponsive. Upon arrival to ED pt in agonal breathing and wandering gaze and did not respond to sternal rub. Pt moved to trauma B. Hx of open heart surgery on March 16 and is in kidney failure and also peritoneal dialysis.

## 2013-08-19 NOTE — Anesthesia Preprocedure Evaluation (Addendum)
Anesthesia Evaluation  Patient identified by MRN, date of birth, ID band Patient confused    Reviewed: Unable to perform ROS - Chart review only  Airway Mallampati: II TM Distance: >3 FB Neck ROM: Limited    Dental  (+) Edentulous Upper, Edentulous Lower   Pulmonary former smoker,  breath sounds clear to auscultation        Cardiovascular hypertension, Pt. on medications + CAD and + Past MI Rhythm:Irregular Rate:Normal     Neuro/Psych CVA (In process)    GI/Hepatic   Endo/Other  diabetes, Well Controlled, Type 2, Insulin Dependent  Renal/GU Dialysis and ESRFRenal disease     Musculoskeletal   Abdominal   Peds  Hematology  (+) anemia ,   Anesthesia Other Findings Pt unable to understand or verbalize re dental advisory  Reproductive/Obstetrics                          Anesthesia Physical Anesthesia Plan  ASA: IV and emergent  Anesthesia Plan: General   Post-op Pain Management:    Induction: Intravenous  Airway Management Planned: Oral ETT  Additional Equipment:   Intra-op Plan:   Post-operative Plan: Extubation in OR  Informed Consent: I have reviewed the patients History and Physical, chart, labs and discussed the procedure including the risks, benefits and alternatives for the proposed anesthesia with the patient or authorized representative who has indicated his/her understanding and acceptance.     Plan Discussed with: CRNA, Anesthesiologist and Surgeon  Anesthesia Plan Comments: (Pt unable to respond to questions as CVA was in progress.  Due to emergent nature of procedure it was not necessary for me to obtain consent; no family was readily available. )        Anesthesia Quick Evaluation

## 2013-08-19 NOTE — ED Notes (Signed)
Pt in CT for CTA.

## 2013-08-19 NOTE — Transfer of Care (Signed)
Immediate Anesthesia Transfer of Care Note  Patient: [name]  Procedure(s) Performed: Procedure(s): RADIOLOGY WITH ANESTHESIA (N/A)  Patient Location: NICU  Anesthesia Type:General  Level of Consciousness: sedated and Patient remains intubated per anesthesia plan  Airway & Oxygen Therapy: Patient remains intubated per anesthesia plan and Patient placed on Ventilator (see vital sign flow sheet for setting)  Post-op Assessment: Post -op Vital signs reviewed and stable  Post vital signs: Reviewed and stable  Complications: No apparent anesthesia complications

## 2013-08-19 NOTE — Progress Notes (Signed)
[place] Progress Note Patient Name: [name] DOB: 02-23-1946 MRN: [id number]  Date of Service  08/19/2013   HPI/Events of Note   Call from Dr Roland Rack: Stroke VDRF. Wants consut  eICU Interventions  Diprivan and vent orders sent CCM bedside MD will see after 7am   Intervention Category Major Interventions: Respiratory failure - evaluation and management  Kalifa Cadden 08/19/2013, 6:09 AM

## 2013-08-19 NOTE — Progress Notes (Signed)
Neuro IR Rt. 9 French femoral sheath removed at 1519 using an exoseal closure device. Hemostasis achieved at 1530  Distal pulses intact. Pressure dressing and sandbag applied to site and reviewed with Arboriculturist.  No immediate complications.  Lt 4 French femoral sheath removed at 1530. VPAD and manual pressure applied to achieve hemostasis at 1540.  Pressure dressing applied to site and reviewed with Arboriculturist.  No immediate complications.  [facility] RT-R [employer] RT-R

## 2013-08-19 NOTE — Progress Notes (Signed)
  Echocardiogram 2D Echocardiogram has been performed.  [name] 08/19/2013, 11:23 AM

## 2013-08-19 NOTE — ED Provider Notes (Addendum)
CSN: [id number]     Arrival date & time 08/18/13  2353 History   First MD Initiated Contact with Patient 08/19/13 0005     Chief Complaint  Patient presents with  . Code Stroke     (Consider location/radiation/quality/duration/timing/severity/associated sxs/prior Treatment) HPI Level 5 Caveat: unresponsive. This is a 68 year old female with a history of end-stage renal disease on hemodialysis. She was last seen normal about one hour ago. Her husband noted the sudden onset of difficulty speaking. This resolved after several minutes and the patient said she did not need to go the hospital. The symptoms recurred about 15 minutes later and her husband noted she was not moving her right side. He brought her to the ED where she was made a Code Stroke in triage. On arrival in room she was noted to be unresponsive with agonal breathing. She was moved immediately to a resuscitation room.   Past Medical History  Diagnosis Date  . Diabetes mellitus   . Hypertension   . Hyperlipidemia   . Coronary artery disease   . Dialysis patient     T/Th/[facility]-Dr Hinda Lenis, stops hemodialysis 11-28-2012, will do peritoneal dialysis  starting 12-01-2012   . Carpal tunnel syndrome   . Anemia   . Ovarian cancer 1995    [name]  . Colon polyps 01/2012    Per colonoscopy, Dr. Gala Romney  . CKD (chronic kidney disease)     peritoneal dialysis at home nightly.  . Transfusion history 03-04-13    Transfusion x1 unit a month ago-[facility]  . Stroke 2010    2010-"brief weakness,tongue thickness,incoherent"-no residual affects.  . Mitral regurgitation   . Myocardial infarction 2009  . Peritonitis 06/23/2013   Past Surgical History  Procedure Laterality Date  . Complete abdominal hysterectomy  1995  . Abdominal hysterectomy    . Colonoscopy with esophagogastroduodenoscopy (egd)  01/27/2012    Dr. Gala Romney. Small hiatal hernia, abnormal second portion of the duodenum with questionable extrinsic  compression. Single left sided colonic diverticulum, 2 colon polyps. Hamartomatous colon polyp  . Eus  04/09/2012    Dr. Owens Loffler: Nonspecific edema/thickening of the periampullary duodenum. Unremarkable biopsy  . Tonsillectomy      child  . Peritoneal catheter insertion  10/30/2012    for peritoneal dialysis  . Back surgery  2009    x3 level fusion-Dr. Louanne Skye  . Cardiac catheterization      x2 stents-'09- Dr. Joellen Jersey  . Esophagogastroduodenoscopy (egd) with propofol N/A 03/11/2013    Dr. Owens Loffler, small duodenal diverticulum. Edematous duodenal mucosa with friability on biopsy (pathology negative)  . Three-vessel cabg, mitral valve annuloplasty, may ease  06/17/2013    [facility]  . Coronary artery bypass graft    . Esophagogastroduodenoscopy N/A 07/02/2013    [facility]:GI BLEED DUE TO LARGE DUODENAL ULCER/Small hiatal hernia/PYLORIC CHANNEL ULCER/Multiple CLEANED BASED ulcers in the duodenal bulb and 2nd part of the duodenum  . Givens capsule study N/A 07/02/2013    Procedure: GIVENS CAPSULE STUDY;  Surgeon: Danie Binder, MD;  Location: [facility];  Service: Endoscopy;  Laterality: N/A;  possible deployment in EGD negative   Family History  Problem Relation Age of Onset  . Diabetes Mother    History  Substance Use Topics  . Smoking status: Former Smoker -- 0.50 packs/day for 15 years  . Smokeless tobacco: Never Used  . Alcohol Use: No   OB History   Grav Para Term Preterm Abortions TAB SAB Ect Mult Living  Review of Systems  Unable to perform ROS   Allergies  Lisinopril  Home Medications   Prior to Admission medications   Medication Sig Start Date End Date Taking? Authorizing Provider  ALPRAZolam Duanne Moron) 0.5 MG tablet Take one tablet by mouth at bedtime as needed for anxiety 07/06/13   Blanchie Serve, MD  Amino Acids-Protein Hydrolys (FEEDING SUPPLEMENT, PRO-STAT SUGAR FREE 64,) LIQD Take 30 mLs by mouth 2 (two) times daily between meals.     Historical Provider, MD  amiodarone (PACERONE) 200 MG tablet Take 1 tablet (200 mg total) by mouth daily. 07/28/13   Burnell Blanks, MD  b complex-vitamin c-folic acid (NEPHRO-VITE) 0.8 MG TABS tablet Take 0.8 mg by mouth every morning.    Historical Provider, MD  carvedilol (COREG) 3.125 MG tablet Take 3.125 mg by mouth 2 (two) times daily with a meal.    Historical Provider, MD  clopidogrel (PLAVIX) 75 MG tablet Hold until 07/09/13. May resume on 07/09/13 07/05/13   Radene Gunning, NP  Insulin Isophane & Regular (HUMULIN 70/30 [place]) Inject 20 Units into the skin 2 (two) times daily. 20 units in the morning   10 units at night    Historical Provider, MD  meclizine (ANTIVERT) 25 MG tablet Take 50 mg by mouth 2 (two) times daily as needed for dizziness.    Historical Provider, MD  pantoprazole (PROTONIX) 40 MG tablet Take 1 tablet (40 mg total) by mouth 2 (two) times daily. 07/05/13   Radene Gunning, NP  simvastatin (ZOCOR) 40 MG tablet Take 40 mg by mouth at bedtime.    Historical Provider, MD   BP 150/63  Pulse 58  Temp(Src) 97.2 F (36.2 C)  Resp 16  Wt 163 lb (73.936 kg)  SpO2 100%  Physical Exam General: Well-developed, well-nourished female in no acute distress; appearance consistent with age of record HENT: normocephalic; atraumatic; dentures Eyes: pupils equal, round and reactive to light; wandering gaze Neck: supple Heart: regular rate and rhythm Lungs: agonal respirations; clear to auscultation bilaterally Abdomen: soft; nondistended; peritoneal dialysis catheter in RLQ Extremities: No deformity; full range of motion; pulses normal; dialysis fistula RUE with pulse and thrill Neurologic: No response to voice; minimal response to sternal rub Skin: Warm and dry; chronic appearing thickening and hyperpigmentation of skin of lower legs     ED Course  Procedures (including critical care time)  CRITICAL CARE Performed by: Karen Chafe Oliveah Zwack Total critical care time: 30  minutes Critical care time was exclusive of separately billable procedures and treating other patients. Critical care was necessary to treat or prevent imminent or life-threatening deterioration. Critical care was time spent personally by me on the following activities: development of treatment plan with patient and/or surrogate as well as nursing, discussions with consultants, evaluation of patient's response to treatment, examination of patient, obtaining history from patient or surrogate, ordering and performing treatments and interventions, ordering and review of laboratory studies, ordering and review of radiographic studies, pulse oximetry and re-evaluation of patient's condition.   MDM  12:30 AM The patient was being prepared for emergent intubation when her level of consciousness improved. She was able to follow simple commands, specifically able to squeeze my finger with her left hand but not her right hand, speech is incomprehensible. She was then taken emergently to the [facility]. CT showed no evidence of intracranial hemorrhage and she was returned to the resuscitation room. Dr. Leonel Ramsay of neurology has now taken over her care.   Nursing notes and vitals  signs, including pulse oximetry, reviewed.  Summary of this visit's results, reviewed by myself:  Labs:  Results for orders placed during the hospital encounter of 08/18/13 (from the past 24 hour(s))  PROTIME-INR     Status: None   Collection Time    08/19/13 12:03 AM      Result Value Ref Range   Prothrombin Time 13.9  11.6 - 15.2 seconds   INR 1.09  0.00 - 1.49  APTT     Status: None   Collection Time    08/19/13 12:03 AM      Result Value Ref Range   aPTT 28  24 - 37 seconds  CBC     Status: Abnormal   Collection Time    08/19/13 12:03 AM      Result Value Ref Range   WBC 5.9  4.0 - 10.5 K/uL   RBC 3.35 (*) 3.87 - 5.11 MIL/uL   Hemoglobin 10.3 (*) 12.0 - 15.0 g/dL   HCT 35.1 (*) 36.0 - 46.0 %   MCV 104.8 (*)  78.0 - 100.0 fL   MCH 30.7  26.0 - 34.0 pg   MCHC 29.3 (*) 30.0 - 36.0 g/dL   RDW 19.1 (*) 11.5 - 15.5 %   Platelets 257  150 - 400 K/uL  DIFFERENTIAL     Status: None   Collection Time    08/19/13 12:03 AM      Result Value Ref Range   Neutrophils Relative % 68  43 - 77 %   Neutro Abs 4.1  1.7 - 7.7 K/uL   Lymphocytes Relative 20  12 - 46 %   Lymphs Abs 1.2  0.7 - 4.0 K/uL   Monocytes Relative 9  3 - 12 %   Monocytes Absolute 0.5  0.1 - 1.0 K/uL   Eosinophils Relative 2  0 - 5 %   Eosinophils Absolute 0.1  0.0 - 0.7 K/uL   Basophils Relative 1  0 - 1 %   Basophils Absolute 0.0  0.0 - 0.1 K/uL  COMPREHENSIVE METABOLIC PANEL     Status: Abnormal   Collection Time    08/19/13 12:03 AM      Result Value Ref Range   Sodium 144  137 - 147 mEq/L   Potassium 4.0  3.7 - 5.3 mEq/L   Chloride 100  96 - 112 mEq/L   CO2 27  19 - 32 mEq/L   Glucose, Bld 243 (*) 70 - 99 mg/dL   BUN 17  6 - 23 mg/dL   Creatinine, Ser 4.98 (*) 0.50 - 1.10 mg/dL   Calcium 8.9  8.4 - 10.5 mg/dL   Total Protein 7.5  6.0 - 8.3 g/dL   Albumin 2.8 (*) 3.5 - 5.2 g/dL   AST 18  0 - 37 U/L   ALT 15  0 - 35 U/L   Alkaline Phosphatase 76  39 - 117 U/L   Total Bilirubin 0.3  0.3 - 1.2 mg/dL   GFR calc non Af Amer 8 (*) >90 mL/min   GFR calc Af Amer 9 (*) >90 mL/min  I-STAT TROPOININ, ED     Status: Abnormal   Collection Time    08/19/13 12:11 AM      Result Value Ref Range   Troponin i, poc 0.17 (*) 0.00 - 0.08 ng/mL   Comment NOTIFIED PHYSICIAN     Comment 3           I-STAT CG4 LACTIC ACID, ED  Status: None   Collection Time    08/19/13 12:13 AM      Result Value Ref Range   Lactic Acid, Venous 1.27  0.5 - 2.2 mmol/L  CBG MONITORING, ED     Status: Abnormal   Collection Time    08/19/13 12:17 AM      Result Value Ref Range   Glucose-Capillary 216 (*) 70 - 99 mg/dL  I-STAT CHEM 8, ED     Status: Abnormal   Collection Time    08/19/13 12:20 AM      Result Value Ref Range   Sodium 142  137 - 147  mEq/L   Potassium 3.7  3.7 - 5.3 mEq/L   Chloride 101  96 - 112 mEq/L   BUN 16  6 - 23 mg/dL   Creatinine, Ser 5.20 (*) 0.50 - 1.10 mg/dL   Glucose, Bld 247 (*) 70 - 99 mg/dL   Calcium, Ion 1.07 (*) 1.13 - 1.30 mmol/L   TCO2 30  0 - 100 mmol/L   Hemoglobin 12.6  12.0 - 15.0 g/dL   HCT 37.0  36.0 - 46.0 %  CBG MONITORING, ED     Status: Abnormal   Collection Time    08/19/13  1:34 AM      Result Value Ref Range   Glucose-Capillary 209 (*) 70 - 99 mg/dL  TROPONIN I     Status: None   Collection Time    08/19/13  5:54 AM      Result Value Ref Range   Troponin I <0.30  <0.30 ng/mL  CBC WITH DIFFERENTIAL     Status: Abnormal   Collection Time    08/19/13  5:54 AM      Result Value Ref Range   WBC 4.9  4.0 - 10.5 K/uL   RBC 2.73 (*) 3.87 - 5.11 MIL/uL   Hemoglobin 8.6 (*) 12.0 - 15.0 g/dL   HCT 28.1 (*) 36.0 - 46.0 %   MCV 102.9 (*) 78.0 - 100.0 fL   MCH 31.5  26.0 - 34.0 pg   MCHC 30.6  30.0 - 36.0 g/dL   RDW 19.1 (*) 11.5 - 15.5 %   Platelets 212  150 - 400 K/uL   Neutrophils Relative % 83 (*) 43 - 77 %   Neutro Abs 4.1  1.7 - 7.7 K/uL   Lymphocytes Relative 10 (*) 12 - 46 %   Lymphs Abs 0.5 (*) 0.7 - 4.0 K/uL   Monocytes Relative 6  3 - 12 %   Monocytes Absolute 0.3  0.1 - 1.0 K/uL   Eosinophils Relative 0  0 - 5 %   Eosinophils Absolute 0.0  0.0 - 0.7 K/uL   Basophils Relative 0  0 - 1 %   Basophils Absolute 0.0  0.0 - 0.1 K/uL  BASIC METABOLIC PANEL     Status: Abnormal   Collection Time    08/19/13  5:54 AM      Result Value Ref Range   Sodium 142  137 - 147 mEq/L   Potassium 4.0  3.7 - 5.3 mEq/L   Chloride 101  96 - 112 mEq/L   CO2 24  19 - 32 mEq/L   Glucose, Bld 244 (*) 70 - 99 mg/dL   BUN 20  6 - 23 mg/dL   Creatinine, Ser 5.17 (*) 0.50 - 1.10 mg/dL   Calcium 8.3 (*) 8.4 - 10.5 mg/dL   GFR calc non Af Amer 8 (*) >90 mL/min   GFR  calc Af Amer 9 (*) >90 mL/min    Imaging Studies: Ct Angio Head W/cm &/or Wo Cm  08/19/2013   CLINICAL DATA:  Code stroke.  Concern for left MCA territory infarct.  EXAM: CT ANGIOGRAPHY HEAD AND NECK  TECHNIQUE: Multidetector CT imaging of the head and neck was performed using the standard protocol during bolus administration of intravenous contrast. Multiplanar CT image reconstructions and MIPs were obtained to evaluate the vascular anatomy. Carotid stenosis measurements (when applicable) are obtained utilizing NASCET criteria, using the distal internal carotid diameter as the denominator.  CONTRAST:  59mL OMNIPAQUE IOHEXOL 350 MG/ML SOLN  COMPARISON:  Prior CT performed earlier on the same day as well as prior MRI from 08/20/2010.  FINDINGS: CTA HEAD FINDINGS  The visualized petrous segments of the internal carotid arteries are well opacified bilaterally. Scattered calcified plaque seen within the cavernous segments bilaterally without definite high-grade flow-limiting stenosis. The supra clinoid segments of the internal carotid arteries are well opacified.  Mild multi focal atheromatous irregularity noted within the left M1 segment. No proximal branch occlusion or high-grade flow-limiting stenosis. There is occlusion of an anterior left M2 branch with absence of flow within the distal MCA branches of the anterior division of the left MCA artery (series 409, image 240). Occlusion occurs within the sylvian fissure on image 233. Flow is seen within the posterior division of the MCA territory.  The right M1 segment is well opacified without proximal branch occlusion or high-grade flow-limiting stenosis. Distal right MCA branches are not within normal limits.  A1 segments are symmetric bilaterally. Anterior communicating artery is normal. Anterior cerebral arteries are well opacified.  The vertebral arteries are codominant. Posterior inferior cerebellar arteries are within normal limits. Vertebrobasilar junction and basilar artery are well opacified. Posterior cerebral arteries are within normal limits. Superior cerebellar arteries are  normal. Fetal origin of the left PCA noted. Mild multi focal irregularity involving the distal left PCA artery noted, similar to prior.  No aneurysm seen within the intracranial circulation.  Review of the MIP images confirms the above findings.  CTA NECK FINDINGS  The visualize aortic arch is of normal caliber with normal 3 vessel morphology. Scattered atherosclerotic plaques noted within the aortic arch. No high-grade proximal stenosis seen at the origin of the great vessels. There is calcified and noncalcified plaque within the proximal left subclavian artery with associated approximately 30-40% of stenosis (series 409, image 37). The left subclavian artery is well opacified distally. The right subclavian artery is within normal limits.  The common carotid arteries are of symmetric caliber bilaterally without evidence of inclusion or dissection. Calcified atheromatous plaque present within the mid left common carotid artery without associated high-grade stenosis by NASCET criteria (series 409, image 80). Heavy atherosclerotic plaque noted about the carotid bifurcations bilaterally.  There is calcified and noncalcified plaque at the origin of the internal carotid arteries bilaterally, right worse than left. On the right, there is associated irregular short segment stenosis of approximately 50% by NASCET criteria. On the left, there is associated stenosis of approximately 30-40% by NASCET criteria. The internal carotid arteries are well opacified distally. Tortuosity of the right internal carotid artery noted.  External carotid arteries and their branches are within normal limits.  Vertebral arteries are of equal caliber without evidence of high-grade flow-limiting stenosis, occlusion, or dissection.  Moderate bilateral pleural effusions are partially visualized, right greater than left. There is patchy and ground-glass opacity within the partially visualized upper lobes, right greater than left. Findings are  indeterminate, but may represent edema and/or infection.  Scattered mildly enlarged left-sided supraclavicular lymph nodes measuring up to 1 cm in short axis are present (series 409, image 55).  No acute osseous abnormality within the neck. Moderate multilevel degenerative changes noted within the visualized spine.  Review of the MIP images confirms the above findings.  IMPRESSION: CTA HEAD:  1. Occlusion of a left M2 branch supplying the anterior division of the left MCA artery territory as above. 2. No other proximal branch occlusion or high-grade flow-limiting stenosis identified within the anterior circulation.  CTA NECK:  1. Atherosclerotic plaque at the mid origin of the right internal carotid artery with associated short segment stenosis of approximately 50% by NASCET criteria. 2. Atherosclerotic plaque at the origin of the left internal carotid artery with associated stenosis of approximately 30-40% by NASCET criteria. 3. No high-grade stenosis or vascular occlusion identified within the neck. 4. Moderate bilateral pleural effusions, right greater than left. 5. Patchy and ground-glass opacity within the partially visualized upper lobes, right greater than left. Findings are indeterminate, and may represent edema and/or infection. Clinical correlation with dedicated chest radiograph recommended. 6. Left supraclavicular adenopathy as above, of uncertain etiology, and may be reactive in nature. Critical Value/emergent results were called by telephone at the time of interpretation on 08/19/2013 at 1:05 AM to Dr. Roland Rack , who verbally acknowledged these results.   Electronically Signed   By: Jeannine Boga M.D.   On: 08/19/2013 01:48   Ct Head Wo Contrast  08/19/2013   CLINICAL DATA:  Post recannulization, tPA  EXAM: CT HEAD WITHOUT CONTRAST  TECHNIQUE: Contiguous axial images were obtained from the base of the skull through the vertex without intravenous contrast.  COMPARISON:  Prior CTA  performed earlier on the same day.  FINDINGS: Atrophy with chronic microvascular ischemic changes are again seen.  A new 1.2 cm date hyperdensity is seen within the left parafalcine Gray matter adjacent to the posterior falx and the left parietal lobe (series 2, image 22). Finding is suspicious for possible small acute parenchymal and/or subarachnoid hemorrhage. Additionally, there is increase hyperdensity along the falx and left tentorium cerebella as compared to prior exam, suspicious for possible small volume acute subdural hemorrhage.  No definite acute large vessel territory infarct identified. No mass lesion or midline shift.  Calvarium is intact.  Orbits are normal.  Scattered opacity present within the ethmoidal air cells bilaterally. No mastoid effusion.  IMPRESSION: 1. New 1.2 cm hyperdensity within the left parafalcine gray matter of the left parietal lobe. Finding is concerning for possible new subarachnoid/intraparenchymal hemorrhage. 2. Increased hyperdensity along the falx and left tentorium. While this finding may be related to recent procedure, possible acute subdural hemorrhage could also have this appearance. A close interval followup CT would likely be helpful to further evaluate these findings.  Critical Value/emergent results were called by telephone at the time of interpretation on 08/19/2013 at 5:40 AM to Dr. Kathrynn Speed, who verbally acknowledged these results.   Electronically Signed   By: Jeannine Boga M.D.   On: 08/19/2013 05:46   Ct Head (brain) Wo Contrast  08/19/2013   CLINICAL DATA:  Right-sided weakness and dysphagia.  Unresponsive.  EXAM: CT HEAD WITHOUT CONTRAST  TECHNIQUE: Contiguous axial images were obtained from the base of the skull through the vertex without intravenous contrast.  COMPARISON:  CT of the head performed 08/19/2010, and MRI/MRA of the brain performed 08/20/2010  FINDINGS: There is no evidence of acute infarction, mass lesion,  or intra- or  extra-axial hemorrhage on CT.  Scattered periventricular and subcortical white matter change likely reflects small vessel ischemic microangiopathy.  The posterior fossa, including the cerebellum, brainstem and fourth ventricle, is within normal limits. The third and lateral ventricles, and basal ganglia are unremarkable in appearance. The cerebral hemispheres demonstrate grossly normal gray-white differentiation. No mass effect or midline shift is seen.  There is no evidence of fracture; visualized osseous structures are unremarkable in appearance. The orbits are within normal limits. The paranasal sinuses and mastoid air cells are well-aerated. No significant soft tissue abnormalities are seen.  IMPRESSION: 1. No acute intracranial pathology seen on CT. 2. Scattered small vessel ischemic microangiopathy.  These results were called by telephone at the time of interpretation on 08/19/2013 at 12:25 AM to Dr. Orvilla Cornwall, who verbally acknowledged these results.   Electronically Signed   By: Garald Balding M.D.   On: 08/19/2013 00:26   Ct Angio Neck W/cm &/or Wo/cm  08/19/2013   CLINICAL DATA:  Code stroke. Concern for left MCA territory infarct.  EXAM: CT ANGIOGRAPHY HEAD AND NECK  TECHNIQUE: Multidetector CT imaging of the head and neck was performed using the standard protocol during bolus administration of intravenous contrast. Multiplanar CT image reconstructions and MIPs were obtained to evaluate the vascular anatomy. Carotid stenosis measurements (when applicable) are obtained utilizing NASCET criteria, using the distal internal carotid diameter as the denominator.  CONTRAST:  60mL OMNIPAQUE IOHEXOL 350 MG/ML SOLN  COMPARISON:  Prior CT performed earlier on the same day as well as prior MRI from 08/20/2010.  FINDINGS: CTA HEAD FINDINGS  The visualized petrous segments of the internal carotid arteries are well opacified bilaterally. Scattered calcified plaque seen within the cavernous segments bilaterally  without definite high-grade flow-limiting stenosis. The supra clinoid segments of the internal carotid arteries are well opacified.  Mild multi focal atheromatous irregularity noted within the left M1 segment. No proximal branch occlusion or high-grade flow-limiting stenosis. There is occlusion of an anterior left M2 branch with absence of flow within the distal MCA branches of the anterior division of the left MCA artery (series 409, image 240). Occlusion occurs within the sylvian fissure on image 233. Flow is seen within the posterior division of the MCA territory.  The right M1 segment is well opacified without proximal branch occlusion or high-grade flow-limiting stenosis. Distal right MCA branches are not within normal limits.  A1 segments are symmetric bilaterally. Anterior communicating artery is normal. Anterior cerebral arteries are well opacified.  The vertebral arteries are codominant. Posterior inferior cerebellar arteries are within normal limits. Vertebrobasilar junction and basilar artery are well opacified. Posterior cerebral arteries are within normal limits. Superior cerebellar arteries are normal. Fetal origin of the left PCA noted. Mild multi focal irregularity involving the distal left PCA artery noted, similar to prior.  No aneurysm seen within the intracranial circulation.  Review of the MIP images confirms the above findings.  CTA NECK FINDINGS  The visualize aortic arch is of normal caliber with normal 3 vessel morphology. Scattered atherosclerotic plaques noted within the aortic arch. No high-grade proximal stenosis seen at the origin of the great vessels. There is calcified and noncalcified plaque within the proximal left subclavian artery with associated approximately 30-40% of stenosis (series 409, image 37). The left subclavian artery is well opacified distally. The right subclavian artery is within normal limits.  The common carotid arteries are of symmetric caliber bilaterally without  evidence of inclusion or dissection. Calcified atheromatous plaque present within  the mid left common carotid artery without associated high-grade stenosis by NASCET criteria (series 409, image 80). Heavy atherosclerotic plaque noted about the carotid bifurcations bilaterally.  There is calcified and noncalcified plaque at the origin of the internal carotid arteries bilaterally, right worse than left. On the right, there is associated irregular short segment stenosis of approximately 50% by NASCET criteria. On the left, there is associated stenosis of approximately 30-40% by NASCET criteria. The internal carotid arteries are well opacified distally. Tortuosity of the right internal carotid artery noted.  External carotid arteries and their branches are within normal limits.  Vertebral arteries are of equal caliber without evidence of high-grade flow-limiting stenosis, occlusion, or dissection.  Moderate bilateral pleural effusions are partially visualized, right greater than left. There is patchy and ground-glass opacity within the partially visualized upper lobes, right greater than left. Findings are indeterminate, but may represent edema and/or infection.  Scattered mildly enlarged left-sided supraclavicular lymph nodes measuring up to 1 cm in short axis are present (series 409, image 55).  No acute osseous abnormality within the neck. Moderate multilevel degenerative changes noted within the visualized spine.  Review of the MIP images confirms the above findings.  IMPRESSION: CTA HEAD:  1. Occlusion of a left M2 branch supplying the anterior division of the left MCA artery territory as above. 2. No other proximal branch occlusion or high-grade flow-limiting stenosis identified within the anterior circulation.  CTA NECK:  1. Atherosclerotic plaque at the mid origin of the right internal carotid artery with associated short segment stenosis of approximately 50% by NASCET criteria. 2. Atherosclerotic plaque at the  origin of the left internal carotid artery with associated stenosis of approximately 30-40% by NASCET criteria. 3. No high-grade stenosis or vascular occlusion identified within the neck. 4. Moderate bilateral pleural effusions, right greater than left. 5. Patchy and ground-glass opacity within the partially visualized upper lobes, right greater than left. Findings are indeterminate, and may represent edema and/or infection. Clinical correlation with dedicated chest radiograph recommended. 6. Left supraclavicular adenopathy as above, of uncertain etiology, and may be reactive in nature. Critical Value/emergent results were called by telephone at the time of interpretation on 08/19/2013 at 1:05 AM to Dr. Roland Rack , who verbally acknowledged these results.   Electronically Signed   By: Jeannine Boga M.D.   On: 08/19/2013 02:20   Dg Chest Port 1 View  08/19/2013   CLINICAL DATA:  Stroke, hypertension, diabetes, hyperlipidemia, coronary artery disease post MI, end-stage renal disease on dialysis, ovarian cancer  EXAM: PORTABLE CHEST - 1 VIEW  COMPARISON:  Portable exam 0122 hr compared to 06/27/2013  FINDINGS: Enlargement of cardiac silhouette post CABG and MVR.  Tortuous thoracic aorta with atherosclerotic calcification.  Pulmonary vascular congestion.  Diffuse interstitial infiltrates consistent with pulmonary edema and CHF.  Bibasilar effusions and atelectasis.  Unable to exclude coexistent consolidation in LEFT lower lobe.  No pneumothorax.  Bones unremarkable.  IMPRESSION: CHF with bibasilar effusions and atelectasis, unable to exclude consolidation in LEFT lower lobe.   Electronically Signed   By: Lavonia Dana M.D.   On: 08/19/2013 01:43    EKG Interpretation  Date/Time:  Thursday Aug 19 2013 01:32:27 EDT Ventricular Rate:  76 PR Interval:  163 QRS Duration: 102 QT Interval:  430 QTC Calculation: 483 R Axis:   -88 Text Interpretation:  Atrial fibrillation Inferior infarct, old  Anterior infarct, old Lateral leads are also involved No significant change since last tracing Confirmed by DOCHERTY  MD, [place] [phone])  on 08/19/2013 1:52:39 AM         Wynetta Fines, MD 08/19/13 [id number]  Wynetta Fines, MD 08/19/13 1034

## 2013-08-19 NOTE — Consult Note (Deleted)
Neurology Consultation Reason for Consult: Aphasia Referring Physician: molpus,   CC: Aphasia  History is obtained from: Husband  HPI: [name] is a 68 y.o. female who was last seen normal around 10:30 at night when she had some slurred speech. She subsequently resolved, but then as they're getting into bed again she had garbled speech and he decided to bring her into the emergency room. She was brought into the ER, and in the ER she was noticed to get significantly worse and became unresponsive for a brief period of time. She became globally aphasic and there is concern briefly that she was not protecting her airway, but her mental status once again improved to the point where she was not intubated.  She had open heart surgery done in early March, she had some GI bleeding done in early April which was cauterized. Husband reports no issues with bleeding since that time and her hemoglobin here is significantly higher than at discharge. She undergoes hemodialysis, but does have a peritoneal dialysis catheter in place.  The need for heart surgery in March was apparently found on evaluation for kidney transplant surgery and this surgery was undertaken in anticipation of kidney transplantation.  LKW: 10:30 PM tpa given?: Yes    ROS: A 14 point ROS was performed and is negative except as noted in the HPI.  Past Medical History  Diagnosis Date  . Diabetes mellitus   . Hypertension   . Hyperlipidemia   . Coronary artery disease   . Dialysis patient     T/Th/[facility]-Dr Hinda Lenis, stops hemodialysis 11-28-2012, will do peritoneal dialysis  starting 12-01-2012   . Carpal tunnel syndrome   . Anemia   . Ovarian cancer 1995    [name]  . Colon polyps 01/2012    Per colonoscopy, Dr. Gala Romney  . CKD (chronic kidney disease)     peritoneal dialysis at home nightly.  . Transfusion history 03-04-13    Transfusion x1 unit a month ago-[facility]  . Stroke 2010    2010-"brief  weakness,tongue thickness,incoherent"-no residual affects.  . Mitral regurgitation   . Myocardial infarction 2009  . Peritonitis 06/23/2013    Family History: Unable to assess secondary to patient's altered mental status.   Social History: Tob: Unable to assess secondary to patient's altered mental status.   Exam: Current vital signs: BP 159/76  Pulse 79  Resp 26  Wt 73.936 kg (163 lb)  SpO2 93% Vital signs in last 24 hours: Pulse Rate:  [79-80] 79 (05/28 0105) Resp:  [26-29] 26 (05/28 0105) BP: (159-160)/(62-76) 159/76 mmHg (05/28 0105) SpO2:  [93 %-96 %] 93 % (05/28 0105) Weight:  [73.936 kg (163 lb)] 73.936 kg (163 lb) (05/27 2300)  General: in bed CV: irregular Abd: NT, ND, PD catheter in place.  Ext: wound on left knee, graft in right arm.  Mental Status: Patient is awake, she does not follow commands or answer questions.  Cranial Nerves: II: Does not blink to threat from right, does from left Pupils are equal, round, and reactive to light.  Discs are difficult to visualize. III,IV, VI: Left gaze preference V: Facial sensation is difficult to assess VII: Facial movement is notable for right facial paralysis VIII, X, XI, XII: Unable to assess secondary to patient's altered mental status.  Motor: Tone is normal. Bulk is normal. 5/5 strength was present on the left side, she is able to hold the right leg against gravity, does not hold the right arm against gravity but  appears to have significant weakness. Sensory: Sensation appears reduced on the right Cerebellar: Unable to assess secondary to patient's altered mental status.  Gait: Unable to assess secondary to patient's altered mental status.     I have reviewed labs in epic and the results pertinent to this consultation are: Elevated creatinine Mildly elevated troponin  I have reviewed the images obtained: CT head-negative acute, CT angiogram left anterior division of the MCA occlusion.  Impression:  68 year old female with a history of renal failure undergoing workup for transplant. She is currently holding aspirin. I discussed with the patient's husband that she had multiple risk factors suggesting she may be a higher risk of a complication of IV TPA, however given the severity of her symptoms I did offer it. The patient's husband expressed understanding and wished to proceed with IV TPA.   Recommendations: 1. HgbA1c, fasting lipid panel 2. MRI, MRA  of the brain without contrast 3. Frequent neuro checks 4. Echocardiogram 5. Carotid dopplers 6. Prophylactic therapy-Antiplatelet med: Aspirin - dose 325mg  PO or 300mg  PR if 24-hour CT is negative 7. Risk factor modification 8. Telemetry monitoring 9. PT consult, OT consult, Speech consult 10. Will need nephrology consultation tomorrow, may need to delay dialysis by a day. 11. Holding bp meds for now 12. ICU hyperglycemia protocol.    Roland Rack, MD [facility] [phone]  If 7pm- 7am, please page neurology on call as listed in [place].   This patient is critically ill and at significant risk of neurological worsening, death and care requires constant monitoring of vital signs, hemodynamics,respiratory and cardiac monitoring, neurological assessment, discussion with family, other specialists and medical decision making of high complexity. I spent 60 minutes of neurocritical care time  in the care of  this patient.  Roland Rack, MD [facility] [phone]  If 7pm- 7am, please page neurology on call as listed in [place]. 08/19/2013  2:01 AM

## 2013-08-19 NOTE — ED Notes (Signed)
The pts swallow screen ended pt unable to follow instructions

## 2013-08-19 NOTE — Progress Notes (Signed)
Chaplain made follow-up visit per overnight chaplain who responded initially to pt's code stroke. Pt's husband and grandson present. Pt's husband expressed gladness that his wife is responding to commands and doing well. He talked at length about his faith and prayer life as sources of strength. Chaplain provided emotional support and caring presence. Will follow up as necessary.

## 2013-08-19 NOTE — Progress Notes (Signed)
Total recanalization at 0412 in IR

## 2013-08-19 NOTE — ED Notes (Signed)
The pt is more alert attempting to get up. No verbal response.  Moving her lt arm and lt leg .  Rt arm has minimal movement still

## 2013-08-19 NOTE — Progress Notes (Signed)
UR completed.  Josephine Rudnick, RN BSN [facility] CCM Trauma/Neuro ICU Case Manager [phone]  

## 2013-08-19 NOTE — Progress Notes (Signed)
Chaplain responded to request for family support in the ED. Presented to pt's husband outside trauma bay B. Husband expressed hope that his wife will recover, comfort in his faith that "God is in control" and "In life or death, she wins." Provided emotional support to husband and daughter through empathic listening, pastoral presence, and prayer. Will refer to unit chaplain for follow up. Please page if needed.   Ethelene Browns [phone]

## 2013-08-19 NOTE — ED Notes (Signed)
The pt opens her eyes to her name  Being called and she looks toward the voice but does not attempt to speak.  Moving her lt arm upward to cover her eyes

## 2013-08-19 NOTE — Consult Note (Signed)
PULMONARY / CRITICAL CARE MEDICINE   Name: [name] MRN: [id number] DOB: [birth year]    ADMISSION DATE:  08/18/2013 CONSULTATION DATE:  5/28  REFERRING MD : Neuro PRIMARY SERVICE: Neuro  CHIEF COMPLAINT:VDRF post stroke and intervention  BRIEF PATIENT DESCRIPTION:  [name] with recent CABG for CAD in preparation for renal transplant who was in usual state of health until 5/27 2315 at which time her husband noted slurred speech that cleared quickly. They were going to bed she demonstrated garbled speech and at that time they came to [facility] ED. She further demonstrated decreased LOC but she protected her airway. Neuro took her to IR for TPA intraarterial LMCA. She was left intubated and transported to NSICU and PCCM asked to manage her ICU care. Note she has a peritoneal HD cath and rt arm bicep graft. Reportedly she gets HD T, TH, Sat via av graft. Note she has a hx of recent GI bleed requiring cautery.   SIGNIFICANT EVENTS / STUDIES:  5/28 TPA  LINES / TUBES: 5/28 ET>> 5/28 Lt fem sheath aline>> 5/28 Rt fem v sheath 5/28 oggt>>  CULTURES: none  ANTIBIOTICS: none  HISTORY OF PRESENT ILLNESS:   [name] with recent CABG for CAD in preparation for renal transplant who was in usual state of health until 5/27 2315 at which time her husband noted slurred speech that cleared quickly. They were going to bed she demonstrated garbled speech and at that time they came to [facility] ED. She further demonstrated decreased LOC but she protected her airway. Neuro took her to IR for TPA intraarterial LMCA. She was left intubated and transported to NSICU and PCCM asked to manage her ICU care. Note she has a peritoneal HD cath and rt arm bicep graft. Reportedly she gets HD T, TH, Sat via av graft. Note she has a hx of recent GI bleed requiring cautery.   PAST MEDICAL HISTORY :  Past Medical History  Diagnosis Date  . Diabetes mellitus   . Hypertension   . Hyperlipidemia   . Coronary  artery disease   . Dialysis patient     T/Th/Sat [facility]-Dr Hinda Lenis, stops hemodialysis 11-28-2012, will do peritoneal dialysis  starting 12-01-2012   . Carpal tunnel syndrome   . Anemia   . Ovarian cancer 1995    [name]  . Colon polyps 01/2012    Per colonoscopy, Dr. Gala Romney  . CKD (chronic kidney disease)     peritoneal dialysis at home nightly.  . Transfusion history 03-04-13    Transfusion x1 unit a month ago-[facility]  . Stroke 2010    2010-"brief weakness,tongue thickness,incoherent"-no residual affects.  . Mitral regurgitation   . Myocardial infarction 2009  . Peritonitis 06/23/2013   Past Surgical History  Procedure Laterality Date  . Complete abdominal hysterectomy  1995  . Abdominal hysterectomy    . Colonoscopy with esophagogastroduodenoscopy (egd)  01/27/2012    Dr. Gala Romney. Small hiatal hernia, abnormal second portion of the duodenum with questionable extrinsic compression. Single left sided colonic diverticulum, 2 colon polyps. Hamartomatous colon polyp  . Eus  04/09/2012    Dr. Owens Loffler: Nonspecific edema/thickening of the periampullary duodenum. Unremarkable biopsy  . Tonsillectomy      child  . Peritoneal catheter insertion  10/30/2012    for peritoneal dialysis  . Back surgery  2009    x3 level fusion-Dr. Louanne Skye  . Cardiac catheterization      x2 stents-'09- Dr. Joellen Jersey  . Esophagogastroduodenoscopy (egd) with  propofol N/A 03/11/2013    Dr. Rob Bunting, small duodenal diverticulum. Edematous duodenal mucosa with friability on biopsy (pathology negative)  . Three-vessel cabg, mitral valve annuloplasty, may ease  06/17/2013    [facility]  . Coronary artery bypass graft    . Esophagogastroduodenoscopy N/A 07/02/2013    SLF:GI BLEED DUE TO LARGE DUODENAL ULCER/Small hiatal hernia/PYLORIC CHANNEL ULCER/Multiple CLEANED BASED ulcers in the duodenal bulb and 2nd part of the duodenum  . Givens capsule study N/A 07/02/2013    Procedure: GIVENS  CAPSULE STUDY;  Surgeon: West Bali, MD;  Location: [facility] ENDO SUITE;  Service: Endoscopy;  Laterality: N/A;  possible deployment in EGD negative   Prior to Admission medications   Medication Sig Start Date End Date Taking? Authorizing Provider  ALPRAZolam Prudy Feeler) 0.5 MG tablet Take 0.5 mg by mouth at bedtime as needed for anxiety.   Yes Historical Provider, MD  carvedilol (COREG) 3.125 MG tablet Take 3.125 mg by mouth 2 (two) times daily with a meal.   Yes Historical Provider, MD  EPINEPHrine 0.3 mg/0.3 mL IJ SOAJ injection Inject 0.3 mg into the muscle daily as needed (allergic reaction).   Yes Historical Provider, MD  Insulin Isophane & Regular (HUMULIN 70/30 [place]) Inject 10-20 Units into the skin 2 (two) times daily. 20 units in the morning   10 units at night   Yes Historical Provider, MD  meclizine (ANTIVERT) 25 MG tablet Take 50 mg by mouth 2 (two) times daily as needed for dizziness.   Yes Historical Provider, MD  multivitamin (RENA-VIT) TABS tablet Take 1 tablet by mouth every morning.   Yes Historical Provider, MD  pantoprazole (PROTONIX) 40 MG tablet Take 1 tablet (40 mg total) by mouth 2 (two) times daily. 07/05/13  Yes Lesle Chris Black, NP  simvastatin (ZOCOR) 40 MG tablet Take 40 mg by mouth at bedtime.   Yes Historical Provider, MD   Allergies  Allergen Reactions  . Lisinopril Swelling    FAMILY HISTORY:  Family History  Problem Relation Age of Onset  . Diabetes Mother    SOCIAL HISTORY:  reports that she has quit smoking. She has never used smokeless tobacco. She reports that she does not drink alcohol or use illicit drugs.  REVIEW OF SYSTEMS:  NA  SUBJECTIVE:   VITAL SIGNS: Temp:  [97.2 F (36.2 C)-97.3 F (36.3 C)] 97.3 F (36.3 C) (05/28 0741) Pulse Rate:  [57-81] 64 (05/28 0741) Resp:  [15-30] 15 (05/28 0741) BP: (134-186)/(51-120) 142/52 mmHg (05/28 0700) SpO2:  [91 %-100 %] 100 % (05/28 0741) Arterial Line BP: (137-176)/(40-54) 141/42 mmHg (05/28 0700) FiO2  (%):  [40 %-60 %] 40 % (05/28 0741) Weight:  [73.936 kg (163 lb)] 73.936 kg (163 lb) (05/27 2300) HEMODYNAMICS:   VENTILATOR SETTINGS: Vent Mode:  [-] PRVC FiO2 (%):  [40 %-60 %] 40 % Set Rate:  [16 bmp] 16 bmp Vt Set:  [450 mL] 450 mL PEEP:  [5 cmH20] 5 cmH20 Plateau Pressure:  [26 cmH20-29 cmH20] 29 cmH20 INTAKE / OUTPUT: Intake/Output     05/27 0701 - 05/28 0700 05/28 0701 - 05/29 0700   I.V. (mL/kg) 202.1 (2.7)    Total Intake(mL/kg) 202.1 (2.7)    Total Output 0     Net +202.1            PHYSICAL EXAMINATION: General:  WNWDWF sedated on vent Neuro:  Sedated, pinpoint but reactive pupils HEENT:  OTT-> vent, OGT-> LWIS. No JVD Cardiovascular: HSR RRR murmur. Old cabg scar noted  Lungs:  coarse rhonchi bilat, diminished bases Abdomen:  Peritoneal cath in place and site unremarkable Musculoskeletal: Intact Skin:  Rt bicep av graft + bruit/trill    LABS:  CBC  Recent Labs Lab 08/19/13 0003 08/19/13 0020 08/19/13 0554  WBC 5.9  --  4.9  HGB 10.3* 12.6 8.6*  HCT 35.1* 37.0 28.1*  PLT 257  --  212   Coag's  Recent Labs Lab 08/19/13 0003  APTT 28  INR 1.09   BMET  Recent Labs Lab 08/19/13 0003 08/19/13 0020 08/19/13 0554  NA 144 142 142  K 4.0 3.7 4.0  CL 100 101 101  CO2 27  --  24  BUN 17 16 20   CREATININE 4.98* 5.20* 5.17*  GLUCOSE 243* 247* 244*   Electrolytes  Recent Labs Lab 08/19/13 0003 08/19/13 0554  CALCIUM 8.9 8.3*   Sepsis Markers  Recent Labs Lab 08/19/13 0013  LATICACIDVEN 1.27   ABG No results found for this basename: PHART, PCO2ART, PO2ART,  in the last 168 hours Liver Enzymes  Recent Labs Lab 08/19/13 0003  AST 18  ALT 15  ALKPHOS 76  BILITOT 0.3  ALBUMIN 2.8*   Cardiac Enzymes  Recent Labs Lab 08/19/13 0554  TROPONINI <0.30   Glucose  Recent Labs Lab 08/19/13 0017 08/19/13 0134 08/19/13 0804  GLUCAP 216* 209* 195*    Imaging Ct Angio Head W/cm &/or Wo Cm  08/19/2013   CLINICAL DATA:  Code  stroke. Concern for left MCA territory infarct.  EXAM: CT ANGIOGRAPHY HEAD AND NECK  TECHNIQUE: Multidetector CT imaging of the head and neck was performed using the standard protocol during bolus administration of intravenous contrast. Multiplanar CT image reconstructions and MIPs were obtained to evaluate the vascular anatomy. Carotid stenosis measurements (when applicable) are obtained utilizing NASCET criteria, using the distal internal carotid diameter as the denominator.  CONTRAST:  81mL OMNIPAQUE IOHEXOL 350 MG/ML SOLN  COMPARISON:  Prior CT performed earlier on the same day as well as prior MRI from 08/20/2010.  FINDINGS: CTA HEAD FINDINGS  The visualized petrous segments of the internal carotid arteries are well opacified bilaterally. Scattered calcified plaque seen within the cavernous segments bilaterally without definite high-grade flow-limiting stenosis. The supra clinoid segments of the internal carotid arteries are well opacified.  Mild multi focal atheromatous irregularity noted within the left M1 segment. No proximal branch occlusion or high-grade flow-limiting stenosis. There is occlusion of an anterior left M2 branch with absence of flow within the distal MCA branches of the anterior division of the left MCA artery (series 409, image 240). Occlusion occurs within the sylvian fissure on image 233. Flow is seen within the posterior division of the MCA territory.  The right M1 segment is well opacified without proximal branch occlusion or high-grade flow-limiting stenosis. Distal right MCA branches are not within normal limits.  A1 segments are symmetric bilaterally. Anterior communicating artery is normal. Anterior cerebral arteries are well opacified.  The vertebral arteries are codominant. Posterior inferior cerebellar arteries are within normal limits. Vertebrobasilar junction and basilar artery are well opacified. Posterior cerebral arteries are within normal limits. Superior cerebellar arteries  are normal. Fetal origin of the left PCA noted. Mild multi focal irregularity involving the distal left PCA artery noted, similar to prior.  No aneurysm seen within the intracranial circulation.  Review of the MIP images confirms the above findings.  CTA NECK FINDINGS  The visualize aortic arch is of normal caliber with normal 3 vessel morphology. Scattered atherosclerotic plaques noted  within the aortic arch. No high-grade proximal stenosis seen at the origin of the great vessels. There is calcified and noncalcified plaque within the proximal left subclavian artery with associated approximately 30-40% of stenosis (series 409, image 37). The left subclavian artery is well opacified distally. The right subclavian artery is within normal limits.  The common carotid arteries are of symmetric caliber bilaterally without evidence of inclusion or dissection. Calcified atheromatous plaque present within the mid left common carotid artery without associated high-grade stenosis by NASCET criteria (series 409, image 80). Heavy atherosclerotic plaque noted about the carotid bifurcations bilaterally.  There is calcified and noncalcified plaque at the origin of the internal carotid arteries bilaterally, right worse than left. On the right, there is associated irregular short segment stenosis of approximately 50% by NASCET criteria. On the left, there is associated stenosis of approximately 30-40% by NASCET criteria. The internal carotid arteries are well opacified distally. Tortuosity of the right internal carotid artery noted.  External carotid arteries and their branches are within normal limits.  Vertebral arteries are of equal caliber without evidence of high-grade flow-limiting stenosis, occlusion, or dissection.  Moderate bilateral pleural effusions are partially visualized, right greater than left. There is patchy and ground-glass opacity within the partially visualized upper lobes, right greater than left. Findings are  indeterminate, but may represent edema and/or infection.  Scattered mildly enlarged left-sided supraclavicular lymph nodes measuring up to 1 cm in short axis are present (series 409, image 55).  No acute osseous abnormality within the neck. Moderate multilevel degenerative changes noted within the visualized spine.  Review of the MIP images confirms the above findings.  IMPRESSION: CTA HEAD:  1. Occlusion of a left M2 branch supplying the anterior division of the left MCA artery territory as above. 2. No other proximal branch occlusion or high-grade flow-limiting stenosis identified within the anterior circulation.  CTA NECK:  1. Atherosclerotic plaque at the mid origin of the right internal carotid artery with associated short segment stenosis of approximately 50% by NASCET criteria. 2. Atherosclerotic plaque at the origin of the left internal carotid artery with associated stenosis of approximately 30-40% by NASCET criteria. 3. No high-grade stenosis or vascular occlusion identified within the neck. 4. Moderate bilateral pleural effusions, right greater than left. 5. Patchy and ground-glass opacity within the partially visualized upper lobes, right greater than left. Findings are indeterminate, and may represent edema and/or infection. Clinical correlation with dedicated chest radiograph recommended. 6. Left supraclavicular adenopathy as above, of uncertain etiology, and may be reactive in nature. Critical Value/emergent results were called by telephone at the time of interpretation on 08/19/2013 at 1:05 AM to Dr. Roland Rack , who verbally acknowledged these results.   Electronically Signed   By: Jeannine Boga M.D.   On: 08/19/2013 01:48   Ct Head Wo Contrast  08/19/2013   CLINICAL DATA:  Post recannulization, tPA  EXAM: CT HEAD WITHOUT CONTRAST  TECHNIQUE: Contiguous axial images were obtained from the base of the skull through the vertex without intravenous contrast.  COMPARISON:  Prior CTA  performed earlier on the same day.  FINDINGS: Atrophy with chronic microvascular ischemic changes are again seen.  A new 1.2 cm date hyperdensity is seen within the left parafalcine Gray matter adjacent to the posterior falx and the left parietal lobe (series 2, image 22). Finding is suspicious for possible small acute parenchymal and/or subarachnoid hemorrhage. Additionally, there is increase hyperdensity along the falx and left tentorium cerebella as compared to prior exam, suspicious for  possible small volume acute subdural hemorrhage.  No definite acute large vessel territory infarct identified. No mass lesion or midline shift.  Calvarium is intact.  Orbits are normal.  Scattered opacity present within the ethmoidal air cells bilaterally. No mastoid effusion.  IMPRESSION: 1. New 1.2 cm hyperdensity within the left parafalcine gray matter of the left parietal lobe. Finding is concerning for possible new subarachnoid/intraparenchymal hemorrhage. 2. Increased hyperdensity along the falx and left tentorium. While this finding may be related to recent procedure, possible acute subdural hemorrhage could also have this appearance. A close interval followup CT would likely be helpful to further evaluate these findings.  Critical Value/emergent results were called by telephone at the time of interpretation on 08/19/2013 at 5:40 AM to Dr. Kathrynn Speed, who verbally acknowledged these results.   Electronically Signed   By: Jeannine Boga M.D.   On: 08/19/2013 05:46   Ct Head (brain) Wo Contrast  08/19/2013   CLINICAL DATA:  Right-sided weakness and dysphagia.  Unresponsive.  EXAM: CT HEAD WITHOUT CONTRAST  TECHNIQUE: Contiguous axial images were obtained from the base of the skull through the vertex without intravenous contrast.  COMPARISON:  CT of the head performed 08/19/2010, and MRI/MRA of the brain performed 08/20/2010  FINDINGS: There is no evidence of acute infarction, mass lesion, or intra- or  extra-axial hemorrhage on CT.  Scattered periventricular and subcortical white matter change likely reflects small vessel ischemic microangiopathy.  The posterior fossa, including the cerebellum, brainstem and fourth ventricle, is within normal limits. The third and lateral ventricles, and basal ganglia are unremarkable in appearance. The cerebral hemispheres demonstrate grossly normal gray-white differentiation. No mass effect or midline shift is seen.  There is no evidence of fracture; visualized osseous structures are unremarkable in appearance. The orbits are within normal limits. The paranasal sinuses and mastoid air cells are well-aerated. No significant soft tissue abnormalities are seen.  IMPRESSION: 1. No acute intracranial pathology seen on CT. 2. Scattered small vessel ischemic microangiopathy.  These results were called by telephone at the time of interpretation on 08/19/2013 at 12:25 AM to Dr. Orvilla Cornwall, who verbally acknowledged these results.   Electronically Signed   By: Garald Balding M.D.   On: 08/19/2013 00:26   Ct Angio Neck W/cm &/or Wo/cm  08/19/2013   CLINICAL DATA:  Code stroke. Concern for left MCA territory infarct.  EXAM: CT ANGIOGRAPHY HEAD AND NECK  TECHNIQUE: Multidetector CT imaging of the head and neck was performed using the standard protocol during bolus administration of intravenous contrast. Multiplanar CT image reconstructions and MIPs were obtained to evaluate the vascular anatomy. Carotid stenosis measurements (when applicable) are obtained utilizing NASCET criteria, using the distal internal carotid diameter as the denominator.  CONTRAST:  88mL OMNIPAQUE IOHEXOL 350 MG/ML SOLN  COMPARISON:  Prior CT performed earlier on the same day as well as prior MRI from 08/20/2010.  FINDINGS: CTA HEAD FINDINGS  The visualized petrous segments of the internal carotid arteries are well opacified bilaterally. Scattered calcified plaque seen within the cavernous segments bilaterally  without definite high-grade flow-limiting stenosis. The supra clinoid segments of the internal carotid arteries are well opacified.  Mild multi focal atheromatous irregularity noted within the left M1 segment. No proximal branch occlusion or high-grade flow-limiting stenosis. There is occlusion of an anterior left M2 branch with absence of flow within the distal MCA branches of the anterior division of the left MCA artery (series 409, image 240). Occlusion occurs within the sylvian fissure on image 233.  Flow is seen within the posterior division of the MCA territory.  The right M1 segment is well opacified without proximal branch occlusion or high-grade flow-limiting stenosis. Distal right MCA branches are not within normal limits.  A1 segments are symmetric bilaterally. Anterior communicating artery is normal. Anterior cerebral arteries are well opacified.  The vertebral arteries are codominant. Posterior inferior cerebellar arteries are within normal limits. Vertebrobasilar junction and basilar artery are well opacified. Posterior cerebral arteries are within normal limits. Superior cerebellar arteries are normal. Fetal origin of the left PCA noted. Mild multi focal irregularity involving the distal left PCA artery noted, similar to prior.  No aneurysm seen within the intracranial circulation.  Review of the MIP images confirms the above findings.  CTA NECK FINDINGS  The visualize aortic arch is of normal caliber with normal 3 vessel morphology. Scattered atherosclerotic plaques noted within the aortic arch. No high-grade proximal stenosis seen at the origin of the great vessels. There is calcified and noncalcified plaque within the proximal left subclavian artery with associated approximately 30-40% of stenosis (series 409, image 37). The left subclavian artery is well opacified distally. The right subclavian artery is within normal limits.  The common carotid arteries are of symmetric caliber bilaterally without  evidence of inclusion or dissection. Calcified atheromatous plaque present within the mid left common carotid artery without associated high-grade stenosis by NASCET criteria (series 409, image 80). Heavy atherosclerotic plaque noted about the carotid bifurcations bilaterally.  There is calcified and noncalcified plaque at the origin of the internal carotid arteries bilaterally, right worse than left. On the right, there is associated irregular short segment stenosis of approximately 50% by NASCET criteria. On the left, there is associated stenosis of approximately 30-40% by NASCET criteria. The internal carotid arteries are well opacified distally. Tortuosity of the right internal carotid artery noted.  External carotid arteries and their branches are within normal limits.  Vertebral arteries are of equal caliber without evidence of high-grade flow-limiting stenosis, occlusion, or dissection.  Moderate bilateral pleural effusions are partially visualized, right greater than left. There is patchy and ground-glass opacity within the partially visualized upper lobes, right greater than left. Findings are indeterminate, but may represent edema and/or infection.  Scattered mildly enlarged left-sided supraclavicular lymph nodes measuring up to 1 cm in short axis are present (series 409, image 55).  No acute osseous abnormality within the neck. Moderate multilevel degenerative changes noted within the visualized spine.  Review of the MIP images confirms the above findings.  IMPRESSION: CTA HEAD:  1. Occlusion of a left M2 branch supplying the anterior division of the left MCA artery territory as above. 2. No other proximal branch occlusion or high-grade flow-limiting stenosis identified within the anterior circulation.  CTA NECK:  1. Atherosclerotic plaque at the mid origin of the right internal carotid artery with associated short segment stenosis of approximately 50% by NASCET criteria. 2. Atherosclerotic plaque at the  origin of the left internal carotid artery with associated stenosis of approximately 30-40% by NASCET criteria. 3. No high-grade stenosis or vascular occlusion identified within the neck. 4. Moderate bilateral pleural effusions, right greater than left. 5. Patchy and ground-glass opacity within the partially visualized upper lobes, right greater than left. Findings are indeterminate, and may represent edema and/or infection. Clinical correlation with dedicated chest radiograph recommended. 6. Left supraclavicular adenopathy as above, of uncertain etiology, and may be reactive in nature. Critical Value/emergent results were called by telephone at the time of interpretation on 08/19/2013 at 1:05 AM  to Dr. Roland Rack , who verbally acknowledged these results.   Electronically Signed   By: Jeannine Boga M.D.   On: 08/19/2013 02:20   Dg Chest Port 1 View  08/19/2013   CLINICAL DATA:  Stroke, hypertension, diabetes, hyperlipidemia, coronary artery disease post MI, end-stage renal disease on dialysis, ovarian cancer  EXAM: PORTABLE CHEST - 1 VIEW  COMPARISON:  Portable exam 0122 hr compared to 06/27/2013  FINDINGS: Enlargement of cardiac silhouette post CABG and MVR.  Tortuous thoracic aorta with atherosclerotic calcification.  Pulmonary vascular congestion.  Diffuse interstitial infiltrates consistent with pulmonary edema and CHF.  Bibasilar effusions and atelectasis.  Unable to exclude coexistent consolidation in LEFT lower lobe.  No pneumothorax.  Bones unremarkable.  IMPRESSION: CHF with bibasilar effusions and atelectasis, unable to exclude consolidation in LEFT lower lobe.   Electronically Signed   By: Lavonia Dana M.D.   On: 08/19/2013 01:43       ASSESSMENT / PLAN:  PULMONARY A: VDRF, effusions, r/o edema P:   Vent adjustment made and abg's in parameters, rate changed Hold sedation Wean now as abg reviewed and at appropriate MV, cpap5 ps5, goal 1 hr, abg to follw then rest on prvc  until sheeths out Extubation likely once not flat ( after sheeths removed)  if she meets criteria and no further neuro interventions required. port c x r fo tube placement confirmation No emergent overload need for hd today Effusion noted on CT neck, no intervention at this stage, will consider further eval with CT chest in future If poor weaning, would CT chest then to evaluate  CARDIOVASCULAR A: Hx of CAD with recent CABG     HTN P:  Note Cardene drip for BP control for Stroke Consider loser BP goals soon Trop x 1 Tele Dc a line asap  RENAL A:  ESRD with HD via rt bicep graft T, TH, Sat      Existing peritoneal HD cath in place x 1 year      On transplant path  P:   Renal consult No acute need for HD 5/28 and s/p tpa KVO fluids Reduce fluids  GASTROINTESTINAL A:  Hx of GIB P:   See hematology  ppi bid Npo Start TF if not extubated  HEMATOLOGIC A:  Hx of GIB       TPA administration 5/28 P:  Close observation for S/S bleeding PPI increased to q 12 h.  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 Rt sided weakness on exam prior to IR procedure P:   RASS goal: 0 with turning sedation off wth attempt towards extubation.  Frequent neuro checks per protocol Neuro guidance on extubation goals. Cardene drip to goals per neuri IR, consider allow BP to rise Ct head reviewed, will need follow up today  TODAY'S SUMMARY:  [name] with recent CABG for CAD in preparation for renal transplant who was in usual state of health until 5/27 2315 at which time her husband noted slurred speech that cleared quickly. They were going to bed she demonstrated garbled speech and at that time they came to [facility] ED. She further demonstrated decreased LOC but she protected her airway. Neuro took her to IR for TPA intraarterial LMCA. She was left intubated and transported to NSICU and  PCCM asked to manage her ICU care. Note she  has a peritoneal HD cath and rt arm bicep graft. Reportedly she gets HD T, TH, Sat via av graft. Note she has a hx of recent GI bleed requiring cautery.   I have personally obtained a history, examined the patient, evaluated laboratory and imaging results, formulated the assessment and plan and placed orders. CRITICAL CARE: The patient is critically ill with multiple organ systems failure and requires high complexity decision making for assessment and support, frequent evaluation and titration of therapies, application of advanced monitoring technologies and extensive interpretation of multiple databases. Critical Care Time devoted to patient care services described in this note is 35 minutes.   Lavon Paganini. Titus Mould, MD, FACP Pgr: [facility]  [facility] [place] Pager: [phone]  08/19/2013, 8:55 AM

## 2013-08-19 NOTE — ED Notes (Signed)
tpa still infusing

## 2013-08-19 NOTE — Consult Note (Signed)
Referring Provider: No ref. provider found Primary Care Physician:  Shirline Frees, MD Primary Nephrologist:  Dr. Edd Arbour  Reason for Consultation:  Advanced renal failure awaiting renal transplant End stage renal disease TTS  [name]  HPI: [name] with recent CABG for CAD in preparation for renal transplant who was in usual state of health until 5/27 2315. Admitted with acute stroke and TPA intraarterial. Dialysis held today and will plan tomorrow   Past Medical History  Diagnosis Date  . Diabetes mellitus   . Hypertension   . Hyperlipidemia   . Coronary artery disease   . Dialysis patient     T/Th/[facility]-Dr Hinda Lenis, stops hemodialysis 11-28-2012, will do peritoneal dialysis  starting 12-01-2012   . Carpal tunnel syndrome   . Anemia   . Ovarian cancer 1995    [name]  . Colon polyps 01/2012    Per colonoscopy, Dr. Gala Romney  . CKD (chronic kidney disease)     peritoneal dialysis at home nightly.  . Transfusion history 03-04-13    Transfusion x1 unit a month ago-[facility]  . Stroke 2010    2010-"brief weakness,tongue thickness,incoherent"-no residual affects.  . Mitral regurgitation   . Myocardial infarction 2009  . Peritonitis 06/23/2013    Past Surgical History  Procedure Laterality Date  . Complete abdominal hysterectomy  1995  . Abdominal hysterectomy    . Colonoscopy with esophagogastroduodenoscopy (egd)  01/27/2012    Dr. Gala Romney. Small hiatal hernia, abnormal second portion of the duodenum with questionable extrinsic compression. Single left sided colonic diverticulum, 2 colon polyps. Hamartomatous colon polyp  . Eus  04/09/2012    Dr. Owens Loffler: Nonspecific edema/thickening of the periampullary duodenum. Unremarkable biopsy  . Tonsillectomy      child  . Peritoneal catheter insertion  10/30/2012    for peritoneal dialysis  . Back surgery  2009    x3 level fusion-Dr. Louanne Skye  . Cardiac catheterization      x2 stents-'09- Dr.  Joellen Jersey  . Esophagogastroduodenoscopy (egd) with propofol N/A 03/11/2013    Dr. Owens Loffler, small duodenal diverticulum. Edematous duodenal mucosa with friability on biopsy (pathology negative)  . Three-vessel cabg, mitral valve annuloplasty, may ease  06/17/2013    [facility]  . Coronary artery bypass graft    . Esophagogastroduodenoscopy N/A 07/02/2013    SLF:GI BLEED DUE TO LARGE DUODENAL ULCER/Small hiatal hernia/PYLORIC CHANNEL ULCER/Multiple CLEANED BASED ulcers in the duodenal bulb and 2nd part of the duodenum  . Givens capsule study N/A 07/02/2013    Procedure: GIVENS CAPSULE STUDY;  Surgeon: Danie Binder, MD;  Location: [facility];  Service: Endoscopy;  Laterality: N/A;  possible deployment in EGD negative    Prior to Admission medications   Medication Sig Start Date End Date Taking? Authorizing Provider  ALPRAZolam Duanne Moron) 0.5 MG tablet Take 0.5 mg by mouth at bedtime as needed for anxiety.   Yes Historical Provider, MD  carvedilol (COREG) 3.125 MG tablet Take 3.125 mg by mouth 2 (two) times daily with a meal.   Yes Historical Provider, MD  EPINEPHrine 0.3 mg/0.3 mL IJ SOAJ injection Inject 0.3 mg into the muscle daily as needed (allergic reaction).   Yes Historical Provider, MD  Insulin Isophane & Regular (HUMULIN 70/30 Howard) Inject 10-20 Units into the skin 2 (two) times daily. 20 units in the morning   10 units at night   Yes Historical Provider, MD  meclizine (ANTIVERT) 25 MG tablet Take 50 mg by mouth 2 (two) times daily  as needed for dizziness.   Yes Historical Provider, MD  multivitamin (RENA-VIT) TABS tablet Take 1 tablet by mouth every morning.   Yes Historical Provider, MD  pantoprazole (PROTONIX) 40 MG tablet Take 1 tablet (40 mg total) by mouth 2 (two) times daily. 07/05/13  Yes Lezlie Octave Black, NP  simvastatin (ZOCOR) 40 MG tablet Take 40 mg by mouth at bedtime.   Yes Historical Provider, MD    Current Facility-Administered Medications  Medication Dose Route  Frequency Provider Last Rate Last Dose  . 0.9 %  sodium chloride infusion   Intravenous Continuous Raylene Miyamoto, MD      . 0.9 %  sodium chloride infusion   Intravenous Continuous Raylene Miyamoto, MD 10 mL/hr at 08/19/13 0932    . acetaminophen (TYLENOL) tablet 650 mg  650 mg Oral Q4H PRN Roland Rack, MD       Or  . acetaminophen (TYLENOL) suppository 650 mg  650 mg Rectal Q4H PRN Roland Rack, MD      . antiseptic oral rinse (BIOTENE) solution 15 mL  15 mL Mouth Rinse QID Raylene Miyamoto, MD   15 mL at 08/19/13 1200  . ceFAZolin (ANCEF) 2-3 GM-% IVPB SOLR           . chlorhexidine (PERIDEX) 0.12 % solution 15 mL  15 mL Mouth Rinse BID Raylene Miyamoto, MD   15 mL at 08/19/13 1215  . insulin aspart (novoLOG) injection 2-6 Units  2-6 Units Subcutaneous 6 times per day Roland Rack, MD   2 Units at 08/19/13 1214  . labetalol (NORMODYNE,TRANDATE) injection 10 mg  10 mg Intravenous Q10 min PRN Roland Rack, MD      . niCARdipine (CARDENE-IV) infusion (0.1 mg/ml)  5-15 mg/hr Intravenous Continuous Rob Hickman, MD 40 mL/hr at 08/19/13 1435 4 mg/hr at 08/19/13 1435  . ondansetron (ZOFRAN) injection 4 mg  4 mg Intravenous Q6H PRN Rob Hickman, MD      . pantoprazole (PROTONIX) injection 40 mg  40 mg Intravenous Q12H William S Minor, NP   40 mg at 08/19/13 1128  . propofol (DIPRIVAN) 10 mg/ml infusion  0-50 mcg/kg/min Intravenous Continuous Brand Males, MD   40 mcg/kg/min at 08/19/13 0615    Allergies as of 08/18/2013 - Review Complete 07/23/2013  Allergen Reaction Noted  . Lisinopril Swelling 08/30/2012    Family History  Problem Relation Age of Onset  . Diabetes Mother     History   Social History  . Marital Status: Married    Spouse Name: N/A    Number of Children: 3  . Years of Education: N/A   Occupational History  . RETIRED    Social History Main Topics  . Smoking status: Former Smoker -- 0.50 packs/day for 15 years   . Smokeless tobacco: Never Used  . Alcohol Use: No  . Drug Use: No  . Sexual Activity: No   Other Topics Concern  . Not on file   Social History Narrative   3 grown healthy children   Lives w/ husband   Has 8 grandkids    Review of Systems: Patient intubated  Physical Exam: Vital signs in last 24 hours: Temp:  [97.2 F (36.2 C)-97.4 F (36.3 C)] 97.4 F (36.3 C) (05/28 1201) Pulse Rate:  [57-81] 74 (05/28 1400) Resp:  [14-30] 17 (05/28 1400) BP: (131-186)/(49-120) 150/84 mmHg (05/28 1400) SpO2:  [91 %-100 %] 96 % (05/28 1400) Arterial Line BP: (127-176)/(40-54) 143/45 mmHg (05/28 1400) FiO2 (%):  [  40 %-60 %] 40 % (05/28 1200) Weight:  [73.936 kg (163 lb)] 73.936 kg (163 lb) (05/27 2300)   General:   Alert,  intubated Head:  Normocephalic and atraumatic. Eyes:  Sclera clear, no icterus.   Conjunctiva pink. Ears:  Normal auditory acuity. Nose:  No deformity, discharge,  or lesions. Mouth:  No deformity or lesions, dentition normal. Neck:  Supple; no masses or thyromegaly. JVP not elevated Lungs:  Clear throughout to auscultation.   No wheezes, crackles, or rhonchi. No acute distress. Heart:  Regular rate and rhythm; no murmurs, clicks, rubs,  or gallops. Abdomen:  Soft, nontender and nondistended. No masses, hepatosplenomegaly or hernias noted. Normal bowel sounds, without guarding, and without rebound.   Msk:  Symmetrical without gross deformities. Normal posture. Pulses:  No carotid, renal, femoral bruits. DP and PT symmetrical and equal Extremities:  Without clubbing or edema. Neurologic:  Hemiparesis  Skin:  Intact without significant lesions or rashes. Cervical Nodes:  No significant cervical adenopathy. Psych:  Alert and cooperative. Normal mood and affect.  Intake/Output from previous day: 05/27 0701 - 05/28 0700 In: 202.1 [I.V.:202.1] Out: 0  Intake/Output this shift: Total I/O In: 435.4 [I.V.:435.4] Out: 0   Lab Results:  Recent Labs   08/19/13 0003 08/19/13 0020 08/19/13 0554  WBC 5.9  --  4.9  HGB 10.3* 12.6 8.6*  HCT 35.1* 37.0 28.1*  PLT 257  --  212   BMET  Recent Labs  08/19/13 0003 08/19/13 0020 08/19/13 0554  NA 144 142 142  K 4.0 3.7 4.0  CL 100 101 101  CO2 27  --  24  GLUCOSE 243* 247* 244*  BUN 17 16 20   CREATININE 4.98* 5.20* 5.17*  CALCIUM 8.9  --  8.3*   LFT  Recent Labs  08/19/13 0003  PROT 7.5  ALBUMIN 2.8*  AST 18  ALT 15  ALKPHOS 76  BILITOT 0.3   PT/INR  Recent Labs  08/19/13 0003  LABPROT 13.9  INR 1.09   Hepatitis Panel No results found for this basename: HEPBSAG, HCVAB, HEPAIGM, HEPBIGM,  in the last 72 hours  Studies/Results: Ct Angio Head W/cm &/or Wo Cm  08/19/2013   CLINICAL DATA:  Code stroke. Concern for left MCA territory infarct.  EXAM: CT ANGIOGRAPHY HEAD AND NECK  TECHNIQUE: Multidetector CT imaging of the head and neck was performed using the standard protocol during bolus administration of intravenous contrast. Multiplanar CT image reconstructions and MIPs were obtained to evaluate the vascular anatomy. Carotid stenosis measurements (when applicable) are obtained utilizing NASCET criteria, using the distal internal carotid diameter as the denominator.  CONTRAST:  39mL OMNIPAQUE IOHEXOL 350 MG/ML SOLN  COMPARISON:  Prior CT performed earlier on the same day as well as prior MRI from 08/20/2010.  FINDINGS: CTA HEAD FINDINGS  The visualized petrous segments of the internal carotid arteries are well opacified bilaterally. Scattered calcified plaque seen within the cavernous segments bilaterally without definite high-grade flow-limiting stenosis. The supra clinoid segments of the internal carotid arteries are well opacified.  Mild multi focal atheromatous irregularity noted within the left M1 segment. No proximal branch occlusion or high-grade flow-limiting stenosis. There is occlusion of an anterior left M2 branch with absence of flow within the distal MCA branches of  the anterior division of the left MCA artery (series 409, image 240). Occlusion occurs within the sylvian fissure on image 233. Flow is seen within the posterior division of the MCA territory.  The right M1 segment is well opacified  without proximal branch occlusion or high-grade flow-limiting stenosis. Distal right MCA branches are not within normal limits.  A1 segments are symmetric bilaterally. Anterior communicating artery is normal. Anterior cerebral arteries are well opacified.  The vertebral arteries are codominant. Posterior inferior cerebellar arteries are within normal limits. Vertebrobasilar junction and basilar artery are well opacified. Posterior cerebral arteries are within normal limits. Superior cerebellar arteries are normal. Fetal origin of the left PCA noted. Mild multi focal irregularity involving the distal left PCA artery noted, similar to prior.  No aneurysm seen within the intracranial circulation.  Review of the MIP images confirms the above findings.  CTA NECK FINDINGS  The visualize aortic arch is of normal caliber with normal 3 vessel morphology. Scattered atherosclerotic plaques noted within the aortic arch. No high-grade proximal stenosis seen at the origin of the great vessels. There is calcified and noncalcified plaque within the proximal left subclavian artery with associated approximately 30-40% of stenosis (series 409, image 37). The left subclavian artery is well opacified distally. The right subclavian artery is within normal limits.  The common carotid arteries are of symmetric caliber bilaterally without evidence of inclusion or dissection. Calcified atheromatous plaque present within the mid left common carotid artery without associated high-grade stenosis by NASCET criteria (series 409, image 80). Heavy atherosclerotic plaque noted about the carotid bifurcations bilaterally.  There is calcified and noncalcified plaque at the origin of the internal carotid arteries  bilaterally, right worse than left. On the right, there is associated irregular short segment stenosis of approximately 50% by NASCET criteria. On the left, there is associated stenosis of approximately 30-40% by NASCET criteria. The internal carotid arteries are well opacified distally. Tortuosity of the right internal carotid artery noted.  External carotid arteries and their branches are within normal limits.  Vertebral arteries are of equal caliber without evidence of high-grade flow-limiting stenosis, occlusion, or dissection.  Moderate bilateral pleural effusions are partially visualized, right greater than left. There is patchy and ground-glass opacity within the partially visualized upper lobes, right greater than left. Findings are indeterminate, but may represent edema and/or infection.  Scattered mildly enlarged left-sided supraclavicular lymph nodes measuring up to 1 cm in short axis are present (series 409, image 55).  No acute osseous abnormality within the neck. Moderate multilevel degenerative changes noted within the visualized spine.  Review of the MIP images confirms the above findings.  IMPRESSION: CTA HEAD:  1. Occlusion of a left M2 branch supplying the anterior division of the left MCA artery territory as above. 2. No other proximal branch occlusion or high-grade flow-limiting stenosis identified within the anterior circulation.  CTA NECK:  1. Atherosclerotic plaque at the mid origin of the right internal carotid artery with associated short segment stenosis of approximately 50% by NASCET criteria. 2. Atherosclerotic plaque at the origin of the left internal carotid artery with associated stenosis of approximately 30-40% by NASCET criteria. 3. No high-grade stenosis or vascular occlusion identified within the neck. 4. Moderate bilateral pleural effusions, right greater than left. 5. Patchy and ground-glass opacity within the partially visualized upper lobes, right greater than left. Findings  are indeterminate, and may represent edema and/or infection. Clinical correlation with dedicated chest radiograph recommended. 6. Left supraclavicular adenopathy as above, of uncertain etiology, and may be reactive in nature. Critical Value/emergent results were called by telephone at the time of interpretation on 08/19/2013 at 1:05 AM to Dr. Roland Rack , who verbally acknowledged these results.   Electronically Signed   By: Marland Kitchen  Jeannine Boga M.D.   On: 08/19/2013 01:48   Ct Head Wo Contrast  08/19/2013   CLINICAL DATA:  Post recannulization, tPA  EXAM: CT HEAD WITHOUT CONTRAST  TECHNIQUE: Contiguous axial images were obtained from the base of the skull through the vertex without intravenous contrast.  COMPARISON:  Prior CTA performed earlier on the same day.  FINDINGS: Atrophy with chronic microvascular ischemic changes are again seen.  A new 1.2 cm date hyperdensity is seen within the left parafalcine Gray matter adjacent to the posterior falx and the left parietal lobe (series 2, image 22). Finding is suspicious for possible small acute parenchymal and/or subarachnoid hemorrhage. Additionally, there is increase hyperdensity along the falx and left tentorium cerebella as compared to prior exam, suspicious for possible small volume acute subdural hemorrhage.  No definite acute large vessel territory infarct identified. No mass lesion or midline shift.  Calvarium is intact.  Orbits are normal.  Scattered opacity present within the ethmoidal air cells bilaterally. No mastoid effusion.  IMPRESSION: 1. New 1.2 cm hyperdensity within the left parafalcine gray matter of the left parietal lobe. Finding is concerning for possible new subarachnoid/intraparenchymal hemorrhage. 2. Increased hyperdensity along the falx and left tentorium. While this finding may be related to recent procedure, possible acute subdural hemorrhage could also have this appearance. A close interval followup CT would likely be helpful  to further evaluate these findings.  Critical Value/emergent results were called by telephone at the time of interpretation on 08/19/2013 at 5:40 AM to Dr. Kathrynn Speed, who verbally acknowledged these results.   Electronically Signed   By: Jeannine Boga M.D.   On: 08/19/2013 05:46   Ct Head (brain) Wo Contrast  08/19/2013   CLINICAL DATA:  Right-sided weakness and dysphagia.  Unresponsive.  EXAM: CT HEAD WITHOUT CONTRAST  TECHNIQUE: Contiguous axial images were obtained from the base of the skull through the vertex without intravenous contrast.  COMPARISON:  CT of the head performed 08/19/2010, and MRI/MRA of the brain performed 08/20/2010  FINDINGS: There is no evidence of acute infarction, mass lesion, or intra- or extra-axial hemorrhage on CT.  Scattered periventricular and subcortical white matter change likely reflects small vessel ischemic microangiopathy.  The posterior fossa, including the cerebellum, brainstem and fourth ventricle, is within normal limits. The third and lateral ventricles, and basal ganglia are unremarkable in appearance. The cerebral hemispheres demonstrate grossly normal gray-white differentiation. No mass effect or midline shift is seen.  There is no evidence of fracture; visualized osseous structures are unremarkable in appearance. The orbits are within normal limits. The paranasal sinuses and mastoid air cells are well-aerated. No significant soft tissue abnormalities are seen.  IMPRESSION: 1. No acute intracranial pathology seen on CT. 2. Scattered small vessel ischemic microangiopathy.  These results were called by telephone at the time of interpretation on 08/19/2013 at 12:25 AM to Dr. Orvilla Cornwall, who verbally acknowledged these results.   Electronically Signed   By: Garald Balding M.D.   On: 08/19/2013 00:26   Ct Angio Neck W/cm &/or Wo/cm  08/19/2013   CLINICAL DATA:  Code stroke. Concern for left MCA territory infarct.  EXAM: CT ANGIOGRAPHY HEAD AND NECK   TECHNIQUE: Multidetector CT imaging of the head and neck was performed using the standard protocol during bolus administration of intravenous contrast. Multiplanar CT image reconstructions and MIPs were obtained to evaluate the vascular anatomy. Carotid stenosis measurements (when applicable) are obtained utilizing NASCET criteria, using the distal internal carotid diameter as the denominator.  CONTRAST:  60mL OMNIPAQUE IOHEXOL 350 MG/ML SOLN  COMPARISON:  Prior CT performed earlier on the same day as well as prior MRI from 08/20/2010.  FINDINGS: CTA HEAD FINDINGS  The visualized petrous segments of the internal carotid arteries are well opacified bilaterally. Scattered calcified plaque seen within the cavernous segments bilaterally without definite high-grade flow-limiting stenosis. The supra clinoid segments of the internal carotid arteries are well opacified.  Mild multi focal atheromatous irregularity noted within the left M1 segment. No proximal branch occlusion or high-grade flow-limiting stenosis. There is occlusion of an anterior left M2 branch with absence of flow within the distal MCA branches of the anterior division of the left MCA artery (series 409, image 240). Occlusion occurs within the sylvian fissure on image 233. Flow is seen within the posterior division of the MCA territory.  The right M1 segment is well opacified without proximal branch occlusion or high-grade flow-limiting stenosis. Distal right MCA branches are not within normal limits.  A1 segments are symmetric bilaterally. Anterior communicating artery is normal. Anterior cerebral arteries are well opacified.  The vertebral arteries are codominant. Posterior inferior cerebellar arteries are within normal limits. Vertebrobasilar junction and basilar artery are well opacified. Posterior cerebral arteries are within normal limits. Superior cerebellar arteries are normal. Fetal origin of the left PCA noted. Mild multi focal irregularity  involving the distal left PCA artery noted, similar to prior.  No aneurysm seen within the intracranial circulation.  Review of the MIP images confirms the above findings.  CTA NECK FINDINGS  The visualize aortic arch is of normal caliber with normal 3 vessel morphology. Scattered atherosclerotic plaques noted within the aortic arch. No high-grade proximal stenosis seen at the origin of the great vessels. There is calcified and noncalcified plaque within the proximal left subclavian artery with associated approximately 30-40% of stenosis (series 409, image 37). The left subclavian artery is well opacified distally. The right subclavian artery is within normal limits.  The common carotid arteries are of symmetric caliber bilaterally without evidence of inclusion or dissection. Calcified atheromatous plaque present within the mid left common carotid artery without associated high-grade stenosis by NASCET criteria (series 409, image 80). Heavy atherosclerotic plaque noted about the carotid bifurcations bilaterally.  There is calcified and noncalcified plaque at the origin of the internal carotid arteries bilaterally, right worse than left. On the right, there is associated irregular short segment stenosis of approximately 50% by NASCET criteria. On the left, there is associated stenosis of approximately 30-40% by NASCET criteria. The internal carotid arteries are well opacified distally. Tortuosity of the right internal carotid artery noted.  External carotid arteries and their branches are within normal limits.  Vertebral arteries are of equal caliber without evidence of high-grade flow-limiting stenosis, occlusion, or dissection.  Moderate bilateral pleural effusions are partially visualized, right greater than left. There is patchy and ground-glass opacity within the partially visualized upper lobes, right greater than left. Findings are indeterminate, but may represent edema and/or infection.  Scattered mildly  enlarged left-sided supraclavicular lymph nodes measuring up to 1 cm in short axis are present (series 409, image 55).  No acute osseous abnormality within the neck. Moderate multilevel degenerative changes noted within the visualized spine.  Review of the MIP images confirms the above findings.  IMPRESSION: CTA HEAD:  1. Occlusion of a left M2 branch supplying the anterior division of the left MCA artery territory as above. 2. No other proximal branch occlusion or high-grade flow-limiting stenosis identified within the anterior circulation.  CTA NECK:  1. Atherosclerotic plaque at the mid origin of the right internal carotid artery with associated short segment stenosis of approximately 50% by NASCET criteria. 2. Atherosclerotic plaque at the origin of the left internal carotid artery with associated stenosis of approximately 30-40% by NASCET criteria. 3. No high-grade stenosis or vascular occlusion identified within the neck. 4. Moderate bilateral pleural effusions, right greater than left. 5. Patchy and ground-glass opacity within the partially visualized upper lobes, right greater than left. Findings are indeterminate, and may represent edema and/or infection. Clinical correlation with dedicated chest radiograph recommended. 6. Left supraclavicular adenopathy as above, of uncertain etiology, and may be reactive in nature. Critical Value/emergent results were called by telephone at the time of interpretation on 08/19/2013 at 1:05 AM to Dr. Roland Rack , who verbally acknowledged these results.   Electronically Signed   By: Jeannine Boga M.D.   On: 08/19/2013 02:20   Dg Chest Port 1 View  08/19/2013   CLINICAL DATA:  Cerebral infarction and respiratory failure.  EXAM: PORTABLE CHEST - 1 VIEW  COMPARISON:  08/19/2013  FINDINGS: The patient has been intubated with the endotracheal tube tip approximately 4.5 cm above the carina. Gastric decompression tube enters the proximal stomach. Lungs show  stable diffuse interstitial edema/CHF with probable small bilateral pleural effusions and stable dense atelectasis at the left base.  IMPRESSION: Endotracheal tube placement with tip approximately 4.5 cm above the carina. Stable diffuse interstitial edema, left lower lobe consolidation and probable small bilateral pleural effusions.   Electronically Signed   By: Aletta Edouard M.D.   On: 08/19/2013 09:52   Dg Chest Port 1 View  08/19/2013   CLINICAL DATA:  Stroke, hypertension, diabetes, hyperlipidemia, coronary artery disease post MI, end-stage renal disease on dialysis, ovarian cancer  EXAM: PORTABLE CHEST - 1 VIEW  COMPARISON:  Portable exam 0122 hr compared to 06/27/2013  FINDINGS: Enlargement of cardiac silhouette post CABG and MVR.  Tortuous thoracic aorta with atherosclerotic calcification.  Pulmonary vascular congestion.  Diffuse interstitial infiltrates consistent with pulmonary edema and CHF.  Bibasilar effusions and atelectasis.  Unable to exclude coexistent consolidation in LEFT lower lobe.  No pneumothorax.  Bones unremarkable.  IMPRESSION: CHF with bibasilar effusions and atelectasis, unable to exclude consolidation in LEFT lower lobe.   Electronically Signed   By: Lavonia Dana M.D.   On: 08/19/2013 01:43    Assessment/Plan:  ESRD- [facility] TTS dialysis  ANEMIA- drop in Hb 12 - 8.6 after TPA will follow  MBD- follow calcium and phosphorus will obtain records  HTN/VOL-controlled  ACCESS- AVG left    LOS: [place] @TODAY @2 :48 PM

## 2013-08-19 NOTE — Progress Notes (Signed)
SLP Cancellation Note  Patient Details Name: [name] MRN: [id number] DOB: 1945-06-28   Cancelled treatment:       Reason Eval/Treat Not Completed: Patient not medically ready. Pt is intubated. SLP will sign off, please reorder   Katherene Ponto Jamorris Ndiaye 08/19/2013, 7:38 AM

## 2013-08-19 NOTE — Progress Notes (Signed)
Puncture time 0309 in IR

## 2013-08-19 NOTE — Progress Notes (Signed)
Partial recanalization at 0403 in IR

## 2013-08-20 ENCOUNTER — Inpatient Hospital Stay (HOSPITAL_COMMUNITY): Payer: Medicare Other

## 2013-08-20 DIAGNOSIS — D649 Anemia, unspecified: Secondary | ICD-10-CM

## 2013-08-20 LAB — CBC WITH DIFFERENTIAL/PLATELET
BASOS ABS: 0 10*3/uL (ref 0.0–0.1)
Basophils Relative: 0 % (ref 0–1)
Eosinophils Absolute: 0 10*3/uL (ref 0.0–0.7)
Eosinophils Relative: 1 % (ref 0–5)
HEMATOCRIT: 27.8 % — AB (ref 36.0–46.0)
Hemoglobin: 8.6 g/dL — ABNORMAL LOW (ref 12.0–15.0)
LYMPHS ABS: 0.8 10*3/uL (ref 0.7–4.0)
LYMPHS PCT: 13 % (ref 12–46)
MCH: 31.4 pg (ref 26.0–34.0)
MCHC: 30.9 g/dL (ref 30.0–36.0)
MCV: 101.5 fL — ABNORMAL HIGH (ref 78.0–100.0)
MONO ABS: 0.4 10*3/uL (ref 0.1–1.0)
Monocytes Relative: 6 % (ref 3–12)
Neutro Abs: 5 10*3/uL (ref 1.7–7.7)
Neutrophils Relative %: 80 % — ABNORMAL HIGH (ref 43–77)
PLATELETS: 214 10*3/uL (ref 150–400)
RBC: 2.74 MIL/uL — AB (ref 3.87–5.11)
RDW: 19.2 % — ABNORMAL HIGH (ref 11.5–15.5)
WBC: 6.3 10*3/uL (ref 4.0–10.5)

## 2013-08-20 LAB — COMPREHENSIVE METABOLIC PANEL
ALBUMIN: 2.1 g/dL — AB (ref 3.5–5.2)
ALK PHOS: 56 U/L (ref 39–117)
ALT: 10 U/L (ref 0–35)
AST: 38 U/L — ABNORMAL HIGH (ref 0–37)
BUN: 24 mg/dL — AB (ref 6–23)
CALCIUM: 8.2 mg/dL — AB (ref 8.4–10.5)
CO2: 22 mEq/L (ref 19–32)
CREATININE: 5.93 mg/dL — AB (ref 0.50–1.10)
Chloride: 102 mEq/L (ref 96–112)
GFR calc non Af Amer: 7 mL/min — ABNORMAL LOW (ref >90)
GFR, EST AFRICAN AMERICAN: 8 mL/min — AB (ref >90)
GLUCOSE: 109 mg/dL — AB (ref 70–99)
POTASSIUM: 5 meq/L (ref 3.7–5.3)
Sodium: 141 mEq/L (ref 137–147)
TOTAL PROTEIN: 6.1 g/dL (ref 6.0–8.3)
Total Bilirubin: 0.3 mg/dL (ref 0.3–1.2)

## 2013-08-20 LAB — BLOOD GAS, ARTERIAL
Acid-base deficit: 3.2 mmol/L — ABNORMAL HIGH (ref 0.0–2.0)
Bicarbonate: 23.1 mEq/L (ref 20.0–24.0)
DRAWN BY: 296031
FIO2: 0.4 %
MODE: POSITIVE
O2 Saturation: 90.6 %
PCO2 ART: 55.1 mmHg — AB (ref 35.0–45.0)
PEEP/CPAP: 5 cmH2O
PO2 ART: 75.7 mmHg — AB (ref 80.0–100.0)
Patient temperature: 98.6
Pressure support: 5 cmH2O
TCO2: 24.7 mmol/L (ref 0–100)
pH, Arterial: 7.245 — ABNORMAL LOW (ref 7.350–7.450)

## 2013-08-20 LAB — GLUCOSE, CAPILLARY
GLUCOSE-CAPILLARY: 93 mg/dL (ref 70–99)
GLUCOSE-CAPILLARY: 94 mg/dL (ref 70–99)
GLUCOSE-CAPILLARY: 131 mg/dL — AB (ref 70–99)
Glucose-Capillary: 108 mg/dL — ABNORMAL HIGH (ref 70–99)
Glucose-Capillary: 141 mg/dL — ABNORMAL HIGH (ref 70–99)

## 2013-08-20 LAB — IRON AND TIBC
IRON: 21 ug/dL — AB (ref 42–135)
SATURATION RATIOS: 16 % — AB (ref 20–55)
TIBC: 132 ug/dL — ABNORMAL LOW (ref 250–470)
UIBC: 111 ug/dL — AB (ref 125–400)

## 2013-08-20 LAB — MAGNESIUM: Magnesium: 1.9 mg/dL (ref 1.5–2.5)

## 2013-08-20 LAB — PROTIME-INR
INR: 1.21 (ref 0.00–1.49)
Prothrombin Time: 15 seconds (ref 11.6–15.2)

## 2013-08-20 LAB — APTT: APTT: 29 s (ref 24–37)

## 2013-08-20 LAB — HEPATITIS B SURFACE ANTIGEN: Hepatitis B Surface Ag: NEGATIVE

## 2013-08-20 LAB — PHOSPHORUS: Phosphorus: 5.8 mg/dL — ABNORMAL HIGH (ref 2.3–4.6)

## 2013-08-20 MED ORDER — SODIUM CHLORIDE 0.9 % IV SOLN: 100.0000 mL | INTRAVENOUS | Status: DC | PRN

## 2013-08-20 MED ORDER — NEPRO/CARBSTEADY PO LIQD: 237.0000 mL | ORAL | Status: DC | PRN

## 2013-08-20 MED ORDER — LORAZEPAM 2 MG/ML IJ SOLN
1.0000 mg | Freq: Once | INTRAMUSCULAR | Status: AC
  Administered 2013-08-20: 1 mg via INTRAVENOUS
  Filled 2013-08-20: qty 1

## 2013-08-20 MED ORDER — NEPRO/CARBSTEADY PO LIQD
1000.0000 mL | ORAL | Status: DC
  Administered 2013-08-20: 1000 mL
  Filled 2013-08-20 (×2): qty 1000

## 2013-08-20 MED ORDER — VITAL HIGH PROTEIN PO LIQD
1000.0000 mL | ORAL | Status: DC
  Filled 2013-08-20 (×2): qty 1000

## 2013-08-20 MED ORDER — LIDOCAINE HCL (PF) 1 % IJ SOLN: 5.0000 mL | INTRAMUSCULAR | Status: DC | PRN

## 2013-08-20 MED ORDER — ALTEPLASE 2 MG IJ SOLR: 2.0000 mg | Freq: Once | INTRAMUSCULAR | Status: DC | PRN

## 2013-08-20 MED ORDER — PENTAFLUOROPROP-TETRAFLUOROETH EX AERO: 1.0000 "application " | INHALATION_SPRAY | CUTANEOUS | Status: DC | PRN

## 2013-08-20 MED ORDER — PENTAFLUOROPROP-TETRAFLUOROETH EX AERO
1.0000 | INHALATION_SPRAY | CUTANEOUS | Status: DC | PRN
Start: 2013-08-20 — End: 2013-08-23

## 2013-08-20 MED ORDER — DARBEPOETIN ALFA-POLYSORBATE 150 MCG/0.3ML IJ SOLN
150.0000 ug | INTRAMUSCULAR | Status: DC
  Administered 2013-08-21: 150 ug via INTRAVENOUS
  Filled 2013-08-20: qty 0.3

## 2013-08-20 MED ORDER — LIDOCAINE-PRILOCAINE 2.5-2.5 % EX CREA: 1.0000 "application " | TOPICAL_CREAM | CUTANEOUS | Status: DC | PRN

## 2013-08-20 MED ORDER — PRO-STAT SUGAR FREE PO LIQD
30.0000 mL | Freq: Every day | ORAL | Status: DC
Start: 2013-08-20 — End: 2013-08-22
  Administered 2013-08-20 – 2013-08-22 (×7): 30 mL
  Filled 2013-08-20 (×14): qty 30

## 2013-08-20 MED ORDER — INSULIN ASPART 100 UNIT/ML ~~LOC~~ SOLN
0.0000 [IU] | SUBCUTANEOUS | Status: DC
  Administered 2013-08-20: 1 [IU] via SUBCUTANEOUS
  Administered 2013-08-20: 2 [IU] via SUBCUTANEOUS
  Administered 2013-08-20: 1 [IU] via SUBCUTANEOUS
  Administered 2013-08-21 (×2): 3 [IU] via SUBCUTANEOUS
  Administered 2013-08-21 (×2): 2 [IU] via SUBCUTANEOUS
  Administered 2013-08-22: 3 [IU] via SUBCUTANEOUS
  Administered 2013-08-22 (×2): 1 [IU] via SUBCUTANEOUS
  Administered 2013-08-22: 2 [IU] via SUBCUTANEOUS

## 2013-08-20 MED ORDER — LORAZEPAM 2 MG/ML IJ SOLN
INTRAMUSCULAR | Status: AC
  Filled 2013-08-20: qty 1

## 2013-08-20 MED ORDER — ALTEPLASE 2 MG IJ SOLR
2.0000 mg | Freq: Once | INTRAMUSCULAR | Status: AC | PRN
  Filled 2013-08-20: qty 2

## 2013-08-20 MED ORDER — HEPARIN SODIUM (PORCINE) 1000 UNIT/ML DIALYSIS
1000.0000 [IU] | INTRAMUSCULAR | Status: DC | PRN
  Filled 2013-08-20: qty 1

## 2013-08-20 MED ORDER — HEPARIN SODIUM (PORCINE) 1000 UNIT/ML DIALYSIS
100.0000 [IU]/kg | INTRAMUSCULAR | Status: DC | PRN
Start: 2013-08-20 — End: 2013-08-23
  Filled 2013-08-20: qty 8

## 2013-08-20 MED ORDER — HEPARIN SODIUM (PORCINE) 1000 UNIT/ML DIALYSIS
100.0000 [IU]/kg | INTRAMUSCULAR | Status: DC | PRN
Start: 2013-08-20 — End: 2013-08-20

## 2013-08-20 MED ORDER — HEPARIN SODIUM (PORCINE) 1000 UNIT/ML DIALYSIS: 1000.0000 [IU] | INTRAMUSCULAR | Status: DC | PRN

## 2013-08-20 MED ORDER — ALPRAZOLAM 0.5 MG PO TABS
0.5000 mg | ORAL_TABLET | Freq: Every day | ORAL | Status: DC
  Administered 2013-08-20: 0.5 mg via ORAL
  Filled 2013-08-20: qty 1

## 2013-08-20 MED ORDER — ALPRAZOLAM 0.5 MG PO TABS
0.5000 mg | ORAL_TABLET | Freq: Once | ORAL | Status: AC
  Administered 2013-08-20: 0.5 mg via ORAL
  Filled 2013-08-20: qty 1

## 2013-08-20 MED ORDER — ASPIRIN 300 MG RE SUPP: 300.0000 mg | Freq: Every day | RECTAL | Status: DC

## 2013-08-20 NOTE — Progress Notes (Signed)
INITIAL NUTRITION ASSESSMENT  DOCUMENTATION CODES Per approved criteria  -Not Applicable   INTERVENTION: Initiate Nepro @ 20 ml/hr via OG tube goal rate   30 ml Prostat five times per day.    Tube feeding regimen provides 1364 kcal (100% of needs), 114 grams of protein, and 348 ml of H2O.   NUTRITION DIAGNOSIS: Inadequate oral intake related to inability to eat as evidenced by NPO status  Goal: Pt to meet >/= 90% of their estimated nutrition needs   Monitor:  Respiratory status, TF tolerance, weight trend, labs   Reason for Assessment: Consult received to initiate and manage enteral nutrition support.  68 y.o. female  Admitting Dx: <principal problem not specified>  ASSESSMENT: [name] with recent CABG for CAD in preparation for renal transplant. Pt with non-healing wound on left leg, followed by WOC. Note she has a peritoneal HD cath and rt arm bicep graft. Pt current receives HD T, TH, Sat.  Pt admitted with left MCA stroke s/p tPA.  Pt unable to be extubated at this time (?edema).   Patient is currently intubated on ventilator support MV: 7.6 L/min Temp (24hrs), Avg:98.6 F (37 C), Min:97.4 F (36.3 C), Max:99.7 F (37.6 C)  Pt discussed during ICU rounds and with RN.  Phosphorus elevated. Plans for HD 5/29.   Nutrition Focused Physical Exam: WNL  Height: Ht Readings from Last 1 Encounters:  08/19/13 5\' 3"  (1.6 m)    Weight: Wt Readings from Last 1 Encounters:  08/19/13 175 lb 4.3 oz (79.5 kg)  Admission weight 163 lb (73.9 kg) - BMI: 28.9  Ideal Body Weight: 52.2 kg   % Ideal Body Weight: 152%  Wt Readings from Last 10 Encounters:  08/19/13 175 lb 4.3 oz (79.5 kg)  08/19/13 175 lb 4.3 oz (79.5 kg)  07/05/13 163 lb 1.6 oz (73.982 kg)  07/05/13 163 lb 1.6 oz (73.982 kg)  03/11/13 170 lb (77.111 kg)  03/11/13 170 lb (77.111 kg)  03/03/13 180 lb (81.647 kg)  01/11/13 188 lb 11.4 oz (85.6 kg)  11/04/12 165 lb (74.844 kg)  11/04/12 165 lb  (74.844 kg)    Usual Body Weight: 165-180 lb   % Usual Body Weight: 100%  BMI:  Body mass index is 31.05 kg/(m^2).  Estimated Nutritional Needs: Kcal: 1368 Protein: 100-115 Fluid: >/= 1.2 L/day  Skin:  Non healing surgical wound LLE PD catheter site Right arm graft  Diet Order: NPO  EDUCATION NEEDS: -No education needs identified at this time   Intake/Output Summary (Last 24 hours) at 08/20/13 1109 Last data filed at 08/20/13 1000  Gross per 24 hour  Intake 1026.67 ml  Output     20 ml  Net 1006.67 ml    Last BM: PTA   Labs:   Recent Labs Lab 08/19/13 0003 08/19/13 0020 08/19/13 0554 08/20/13 0330  NA 144 142 142 141  K 4.0 3.7 4.0 5.0  CL 100 101 101 102  CO2 27  --  24 22  BUN 17 16 20  24*  CREATININE 4.98* 5.20* 5.17* 5.93*  CALCIUM 8.9  --  8.3* 8.2*  MG  --   --   --  1.9  PHOS  --   --   --  5.8*  GLUCOSE 243* 247* 244* 109*    CBG (last 3)   Recent Labs  08/20/13 0023 08/20/13 0351 08/20/13 0751  GLUCAP 108* 93 94    Scheduled Meds: . ALPRAZolam  0.5 mg Oral QHS  .  antiseptic oral rinse  15 mL Mouth Rinse QID  . chlorhexidine  15 mL Mouth Rinse BID  . [START ON 08/21/2013] darbepoetin (ARANESP) injection - DIALYSIS  150 mcg Intravenous Q Sat-HD  . feeding supplement (VITAL HIGH PROTEIN)  1,000 mL Per Tube Q24H  . insulin aspart  0-9 Units Subcutaneous 6 times per day  . pantoprazole (PROTONIX) IV  40 mg Intravenous Q12H    Continuous Infusions: . sodium chloride Stopped (08/19/13 0932)  . sodium chloride 10 mL/hr at 08/20/13 0600  . niCARDipine 5 mg/hr (08/20/13 1000)  . propofol Stopped (08/19/13 0725)    Past Medical History  Diagnosis Date  . Diabetes mellitus   . Hypertension   . Hyperlipidemia   . Coronary artery disease   . Dialysis patient     T/Th/Sat [facility]-Dr Hinda Lenis, stops hemodialysis 11-28-2012, will do peritoneal dialysis  starting 12-01-2012   . Carpal tunnel syndrome   . Anemia   . Ovarian cancer 1995     [name]  . Colon polyps 01/2012    Per colonoscopy, Dr. Gala Romney  . CKD (chronic kidney disease)     peritoneal dialysis at home nightly.  . Transfusion history 03-04-13    Transfusion x1 unit a month ago-[facility]  . Stroke 2010    2010-"brief weakness,tongue thickness,incoherent"-no residual affects.  . Mitral regurgitation   . Myocardial infarction 2009  . Peritonitis 06/23/2013    Past Surgical History  Procedure Laterality Date  . Complete abdominal hysterectomy  1995  . Abdominal hysterectomy    . Colonoscopy with esophagogastroduodenoscopy (egd)  01/27/2012    Dr. Gala Romney. Small hiatal hernia, abnormal second portion of the duodenum with questionable extrinsic compression. Single left sided colonic diverticulum, 2 colon polyps. Hamartomatous colon polyp  . Eus  04/09/2012    Dr. Owens Loffler: Nonspecific edema/thickening of the periampullary duodenum. Unremarkable biopsy  . Tonsillectomy      child  . Peritoneal catheter insertion  10/30/2012    for peritoneal dialysis  . Back surgery  2009    x3 level fusion-Dr. Louanne Skye  . Cardiac catheterization      x2 stents-'09- Dr. Joellen Jersey  . Esophagogastroduodenoscopy (egd) with propofol N/A 03/11/2013    Dr. Owens Loffler, small duodenal diverticulum. Edematous duodenal mucosa with friability on biopsy (pathology negative)  . Three-vessel cabg, mitral valve annuloplasty, may ease  06/17/2013    [facility]  . Coronary artery bypass graft    . Esophagogastroduodenoscopy N/A 07/02/2013    SLF:GI BLEED DUE TO LARGE DUODENAL ULCER/Small hiatal hernia/PYLORIC CHANNEL ULCER/Multiple CLEANED BASED ulcers in the duodenal bulb and 2nd part of the duodenum  . Givens capsule study N/A 07/02/2013    Procedure: GIVENS CAPSULE STUDY;  Surgeon: Danie Binder, MD;  Location: [facility] SUITE;  Service: Endoscopy;  Laterality: N/A;  possible deployment in EGD negative    Maylon Peppers RD, [place], [place] Pager [phone] After  Hours Pager

## 2013-08-20 NOTE — Progress Notes (Signed)
OT Cancellation Note  Patient Details Name: [name] MRN: [id number] DOB: 1945/11/20   Cancelled Treatment:    Reason Eval/Treat Not Completed: Patient not medically ready  [facility], OTR/L  [phone] 08/20/2013 08/20/2013, 12:12 PM

## 2013-08-20 NOTE — Progress Notes (Signed)
RT Note:  Placed pt on SBT, inc PS to 10 d/t low VT in 100's. Pt now tolerating 10/5 40% weaning. HR 85, Sat 97, RR 20, VT 322. RT will continue to monitor.  [name]

## 2013-08-20 NOTE — Progress Notes (Signed)
Subjective: Interval History: has no complaint .  Objective: Vital signs in last 24 hours: Temp:  [97.4 F (36.3 C)-99.7 F (37.6 C)] 99.7 F (37.6 C) (05/29 0754) Pulse Rate:  [71-82] 82 (05/29 0800) Resp:  [14-19] 19 (05/29 0800) BP: (112-151)/(50-84) 133/65 mmHg (05/29 0800) SpO2:  [96 %-100 %] 97 % (05/29 0800) Arterial Line BP: (127-145)/(44-46) 145/46 mmHg (05/28 1500) FiO2 (%):  [40 %-50 %] 40 % (05/29 0934) Weight change:   Intake/Output from previous day: 05/28 0701 - 05/29 0700 In: 1247.1 [I.V.:1247.1] Out: 20 [Urine:20] Intake/Output this shift: Total I/O In: 10 [I.V.:10] Out: -   General appearance: cooperative, moderately obese, pale and slowed mentation Resp: diminished breath sounds bilaterally and rales bibasilar Cardio: S1, S2 normal and systolic murmur: holosystolic 2/6, decrescendo at apex GI: pos bs, liver down 4 cm. PD cath L mid abdm Extremities: edema 3+, AVF RUA B&T  Lab Results:  Recent Labs  08/19/13 0554 08/20/13 0330  WBC 4.9 6.3  HGB 8.6* 8.6*  HCT 28.1* 27.8*  PLT 212 214   BMET:  Recent Labs  08/19/13 0554 08/20/13 0330  NA 142 141  K 4.0 5.0  CL 101 102  CO2 24 22  GLUCOSE 244* 109*  BUN 20 24*  CREATININE 5.17* 5.93*  CALCIUM 8.3* 8.2*   No results found for this basename: PTH,  in the last 72 hours Iron Studies: No results found for this basename: IRON, TIBC, TRANSFERRIN, FERRITIN,  in the last 72 hours  Studies/Results: Ct Angio Head W/cm &/or Wo Cm  08/19/2013   CLINICAL DATA:  Code stroke. Concern for left MCA territory infarct.  EXAM: CT ANGIOGRAPHY HEAD AND NECK  TECHNIQUE: Multidetector CT imaging of the head and neck was performed using the standard protocol during bolus administration of intravenous contrast. Multiplanar CT image reconstructions and MIPs were obtained to evaluate the vascular anatomy. Carotid stenosis measurements (when applicable) are obtained utilizing NASCET criteria, using the distal internal  carotid diameter as the denominator.  CONTRAST:  71mL OMNIPAQUE IOHEXOL 350 MG/ML SOLN  COMPARISON:  Prior CT performed earlier on the same day as well as prior MRI from 08/20/2010.  FINDINGS: CTA HEAD FINDINGS  The visualized petrous segments of the internal carotid arteries are well opacified bilaterally. Scattered calcified plaque seen within the cavernous segments bilaterally without definite high-grade flow-limiting stenosis. The supra clinoid segments of the internal carotid arteries are well opacified.  Mild multi focal atheromatous irregularity noted within the left M1 segment. No proximal branch occlusion or high-grade flow-limiting stenosis. There is occlusion of an anterior left M2 branch with absence of flow within the distal MCA branches of the anterior division of the left MCA artery (series 409, image 240). Occlusion occurs within the sylvian fissure on image 233. Flow is seen within the posterior division of the MCA territory.  The right M1 segment is well opacified without proximal branch occlusion or high-grade flow-limiting stenosis. Distal right MCA branches are not within normal limits.  A1 segments are symmetric bilaterally. Anterior communicating artery is normal. Anterior cerebral arteries are well opacified.  The vertebral arteries are codominant. Posterior inferior cerebellar arteries are within normal limits. Vertebrobasilar junction and basilar artery are well opacified. Posterior cerebral arteries are within normal limits. Superior cerebellar arteries are normal. Fetal origin of the left PCA noted. Mild multi focal irregularity involving the distal left PCA artery noted, similar to prior.  No aneurysm seen within the intracranial circulation.  Review of the MIP images confirms the above  findings.  CTA NECK FINDINGS  The visualize aortic arch is of normal caliber with normal 3 vessel morphology. Scattered atherosclerotic plaques noted within the aortic arch. No high-grade proximal  stenosis seen at the origin of the great vessels. There is calcified and noncalcified plaque within the proximal left subclavian artery with associated approximately 30-40% of stenosis (series 409, image 37). The left subclavian artery is well opacified distally. The right subclavian artery is within normal limits.  The common carotid arteries are of symmetric caliber bilaterally without evidence of inclusion or dissection. Calcified atheromatous plaque present within the mid left common carotid artery without associated high-grade stenosis by NASCET criteria (series 409, image 80). Heavy atherosclerotic plaque noted about the carotid bifurcations bilaterally.  There is calcified and noncalcified plaque at the origin of the internal carotid arteries bilaterally, right worse than left. On the right, there is associated irregular short segment stenosis of approximately 50% by NASCET criteria. On the left, there is associated stenosis of approximately 30-40% by NASCET criteria. The internal carotid arteries are well opacified distally. Tortuosity of the right internal carotid artery noted.  External carotid arteries and their branches are within normal limits.  Vertebral arteries are of equal caliber without evidence of high-grade flow-limiting stenosis, occlusion, or dissection.  Moderate bilateral pleural effusions are partially visualized, right greater than left. There is patchy and ground-glass opacity within the partially visualized upper lobes, right greater than left. Findings are indeterminate, but may represent edema and/or infection.  Scattered mildly enlarged left-sided supraclavicular lymph nodes measuring up to 1 cm in short axis are present (series 409, image 55).  No acute osseous abnormality within the neck. Moderate multilevel degenerative changes noted within the visualized spine.  Review of the MIP images confirms the above findings.  IMPRESSION: CTA HEAD:  1. Occlusion of a left M2 branch supplying  the anterior division of the left MCA artery territory as above. 2. No other proximal branch occlusion or high-grade flow-limiting stenosis identified within the anterior circulation.  CTA NECK:  1. Atherosclerotic plaque at the mid origin of the right internal carotid artery with associated short segment stenosis of approximately 50% by NASCET criteria. 2. Atherosclerotic plaque at the origin of the left internal carotid artery with associated stenosis of approximately 30-40% by NASCET criteria. 3. No high-grade stenosis or vascular occlusion identified within the neck. 4. Moderate bilateral pleural effusions, right greater than left. 5. Patchy and ground-glass opacity within the partially visualized upper lobes, right greater than left. Findings are indeterminate, and may represent edema and/or infection. Clinical correlation with dedicated chest radiograph recommended. 6. Left supraclavicular adenopathy as above, of uncertain etiology, and may be reactive in nature. Critical Value/emergent results were called by telephone at the time of interpretation on 08/19/2013 at 1:05 AM to Dr. Roland Rack , who verbally acknowledged these results.   Electronically Signed   By: Jeannine Boga M.D.   On: 08/19/2013 01:48   Ct Head Wo Contrast  08/20/2013   CLINICAL DATA:  Stroke.  24 hr status post tPA.  EXAM: CT HEAD WITHOUT CONTRAST  TECHNIQUE: Contiguous axial images were obtained from the base of the skull through the vertex without intravenous contrast.  COMPARISON:  Prior head CT from 08/19/2013.  FINDINGS: There is a persistent 1.2 cm hyperdensity seen within the left parafalcine gray matter of the left parietal lobe (series 2, image 21). This hyperdensity is grossly stable relative to most recent CT, and may represent a small amount of hemorrhage. Additionally, increased hyperdensity  along the falx is also similar. Hyperdensity along the left tentorium has largely resolved. Is unclear whether these  opacities represent small amount of blood products or are related to recent catheter directed angiogram.  No definite acute large vessel territory infarct identified. Focal hypodensity overlying the cortical gray matter of the left frontal lobe on image 18 is favored to reflect volume averaging with the adjacent cortical sulcus. No mass lesion or midline shift. No hydrocephalus. Atrophy with chronic microvascular ischemic changes again noted.  Calvarium is intact. Orbits are unremarkable. Paranasal sinuses and mastoid air cells are clear.  IMPRESSION: 1. No acute large vessel territory infarct identified. 2. Stable 1.2 cm hyperdensity within the left parafalcine gray matter within the left parietal lobe. Finding is stable as compared to most recent CT, but new relative to initial CT performed prior to catheter directed arteriogram and tPA administration. It is unclear whether this opacity represents a small amount of blood products or is related to recent procedure. Continued followup recommended. 3. Increased hyperdensity along the falx as compared to initial CT from 08/19/2013, with decreased hyperdensity along the left tentorium. Again, it is unclear whether this represents a small amount of subdural hemorrhage versus sequelae of recent intervention. Continued attention on followup recommended.   Electronically Signed   By: Jeannine Boga M.D.   On: 08/20/2013 02:49   Ct Head Wo Contrast  08/19/2013   CLINICAL DATA:  Post recannulization, tPA  EXAM: CT HEAD WITHOUT CONTRAST  TECHNIQUE: Contiguous axial images were obtained from the base of the skull through the vertex without intravenous contrast.  COMPARISON:  Prior CTA performed earlier on the same day.  FINDINGS: Atrophy with chronic microvascular ischemic changes are again seen.  A new 1.2 cm date hyperdensity is seen within the left parafalcine Gray matter adjacent to the posterior falx and the left parietal lobe (series 2, image 22). Finding is  suspicious for possible small acute parenchymal and/or subarachnoid hemorrhage. Additionally, there is increase hyperdensity along the falx and left tentorium cerebella as compared to prior exam, suspicious for possible small volume acute subdural hemorrhage.  No definite acute large vessel territory infarct identified. No mass lesion or midline shift.  Calvarium is intact.  Orbits are normal.  Scattered opacity present within the ethmoidal air cells bilaterally. No mastoid effusion.  IMPRESSION: 1. New 1.2 cm hyperdensity within the left parafalcine gray matter of the left parietal lobe. Finding is concerning for possible new subarachnoid/intraparenchymal hemorrhage. 2. Increased hyperdensity along the falx and left tentorium. While this finding may be related to recent procedure, possible acute subdural hemorrhage could also have this appearance. A close interval followup CT would likely be helpful to further evaluate these findings.  Critical Value/emergent results were called by telephone at the time of interpretation on 08/19/2013 at 5:40 AM to Dr. Kathrynn Speed, who verbally acknowledged these results.   Electronically Signed   By: Jeannine Boga M.D.   On: 08/19/2013 05:46   Ct Head (brain) Wo Contrast  08/19/2013   CLINICAL DATA:  Right-sided weakness and dysphagia.  Unresponsive.  EXAM: CT HEAD WITHOUT CONTRAST  TECHNIQUE: Contiguous axial images were obtained from the base of the skull through the vertex without intravenous contrast.  COMPARISON:  CT of the head performed 08/19/2010, and MRI/MRA of the brain performed 08/20/2010  FINDINGS: There is no evidence of acute infarction, mass lesion, or intra- or extra-axial hemorrhage on CT.  Scattered periventricular and subcortical white matter change likely reflects small vessel ischemic microangiopathy.  The posterior fossa, including the cerebellum, brainstem and fourth ventricle, is within normal limits. The third and lateral ventricles, and  basal ganglia are unremarkable in appearance. The cerebral hemispheres demonstrate grossly normal gray-white differentiation. No mass effect or midline shift is seen.  There is no evidence of fracture; visualized osseous structures are unremarkable in appearance. The orbits are within normal limits. The paranasal sinuses and mastoid air cells are well-aerated. No significant soft tissue abnormalities are seen.  IMPRESSION: 1. No acute intracranial pathology seen on CT. 2. Scattered small vessel ischemic microangiopathy.  These results were called by telephone at the time of interpretation on 08/19/2013 at 12:25 AM to Dr. Orvilla Cornwall, who verbally acknowledged these results.   Electronically Signed   By: Garald Balding M.D.   On: 08/19/2013 00:26   Ct Angio Neck W/cm &/or Wo/cm  08/19/2013   CLINICAL DATA:  Code stroke. Concern for left MCA territory infarct.  EXAM: CT ANGIOGRAPHY HEAD AND NECK  TECHNIQUE: Multidetector CT imaging of the head and neck was performed using the standard protocol during bolus administration of intravenous contrast. Multiplanar CT image reconstructions and MIPs were obtained to evaluate the vascular anatomy. Carotid stenosis measurements (when applicable) are obtained utilizing NASCET criteria, using the distal internal carotid diameter as the denominator.  CONTRAST:  63mL OMNIPAQUE IOHEXOL 350 MG/ML SOLN  COMPARISON:  Prior CT performed earlier on the same day as well as prior MRI from 08/20/2010.  FINDINGS: CTA HEAD FINDINGS  The visualized petrous segments of the internal carotid arteries are well opacified bilaterally. Scattered calcified plaque seen within the cavernous segments bilaterally without definite high-grade flow-limiting stenosis. The supra clinoid segments of the internal carotid arteries are well opacified.  Mild multi focal atheromatous irregularity noted within the left M1 segment. No proximal branch occlusion or high-grade flow-limiting stenosis. There is occlusion  of an anterior left M2 branch with absence of flow within the distal MCA branches of the anterior division of the left MCA artery (series 409, image 240). Occlusion occurs within the sylvian fissure on image 233. Flow is seen within the posterior division of the MCA territory.  The right M1 segment is well opacified without proximal branch occlusion or high-grade flow-limiting stenosis. Distal right MCA branches are not within normal limits.  A1 segments are symmetric bilaterally. Anterior communicating artery is normal. Anterior cerebral arteries are well opacified.  The vertebral arteries are codominant. Posterior inferior cerebellar arteries are within normal limits. Vertebrobasilar junction and basilar artery are well opacified. Posterior cerebral arteries are within normal limits. Superior cerebellar arteries are normal. Fetal origin of the left PCA noted. Mild multi focal irregularity involving the distal left PCA artery noted, similar to prior.  No aneurysm seen within the intracranial circulation.  Review of the MIP images confirms the above findings.  CTA NECK FINDINGS  The visualize aortic arch is of normal caliber with normal 3 vessel morphology. Scattered atherosclerotic plaques noted within the aortic arch. No high-grade proximal stenosis seen at the origin of the great vessels. There is calcified and noncalcified plaque within the proximal left subclavian artery with associated approximately 30-40% of stenosis (series 409, image 37). The left subclavian artery is well opacified distally. The right subclavian artery is within normal limits.  The common carotid arteries are of symmetric caliber bilaterally without evidence of inclusion or dissection. Calcified atheromatous plaque present within the mid left common carotid artery without associated high-grade stenosis by NASCET criteria (series 409, image 80). Heavy atherosclerotic plaque noted about the  carotid bifurcations bilaterally.  There is  calcified and noncalcified plaque at the origin of the internal carotid arteries bilaterally, right worse than left. On the right, there is associated irregular short segment stenosis of approximately 50% by NASCET criteria. On the left, there is associated stenosis of approximately 30-40% by NASCET criteria. The internal carotid arteries are well opacified distally. Tortuosity of the right internal carotid artery noted.  External carotid arteries and their branches are within normal limits.  Vertebral arteries are of equal caliber without evidence of high-grade flow-limiting stenosis, occlusion, or dissection.  Moderate bilateral pleural effusions are partially visualized, right greater than left. There is patchy and ground-glass opacity within the partially visualized upper lobes, right greater than left. Findings are indeterminate, but may represent edema and/or infection.  Scattered mildly enlarged left-sided supraclavicular lymph nodes measuring up to 1 cm in short axis are present (series 409, image 55).  No acute osseous abnormality within the neck. Moderate multilevel degenerative changes noted within the visualized spine.  Review of the MIP images confirms the above findings.  IMPRESSION: CTA HEAD:  1. Occlusion of a left M2 branch supplying the anterior division of the left MCA artery territory as above. 2. No other proximal branch occlusion or high-grade flow-limiting stenosis identified within the anterior circulation.  CTA NECK:  1. Atherosclerotic plaque at the mid origin of the right internal carotid artery with associated short segment stenosis of approximately 50% by NASCET criteria. 2. Atherosclerotic plaque at the origin of the left internal carotid artery with associated stenosis of approximately 30-40% by NASCET criteria. 3. No high-grade stenosis or vascular occlusion identified within the neck. 4. Moderate bilateral pleural effusions, right greater than left. 5. Patchy and ground-glass opacity  within the partially visualized upper lobes, right greater than left. Findings are indeterminate, and may represent edema and/or infection. Clinical correlation with dedicated chest radiograph recommended. 6. Left supraclavicular adenopathy as above, of uncertain etiology, and may be reactive in nature. Critical Value/emergent results were called by telephone at the time of interpretation on 08/19/2013 at 1:05 AM to Dr. Roland Rack , who verbally acknowledged these results.   Electronically Signed   By: Jeannine Boga M.D.   On: 08/19/2013 02:20   Dg Chest Port 1 View  08/20/2013   CLINICAL DATA:  Intubated patient.  Pleural effusions.  EXAM: PORTABLE CHEST - 1 VIEW  COMPARISON:  08/19/2013  FINDINGS: Endotracheal tube and nasogastric tube are stable and well positioned.  Changes from cardiac surgery are stable.  No mediastinal widening.  Hazy perihilar and lung base opacity as well as interstitial thickening is noted, improved in the left mid lung from the prior study, but otherwise stable, consistent with a combination of pulmonary edema and small bilateral effusions.  No pneumothorax.  IMPRESSION: Mildly improved pulmonary edema most evident in the left mid and lower lung. Persistent small effusions. No new abnormalities. Support apparatus remains well-positioned.   Electronically Signed   By: Lajean Manes M.D.   On: 08/20/2013 08:02   Dg Chest Port 1 View  08/19/2013   CLINICAL DATA:  Cerebral infarction and respiratory failure.  EXAM: PORTABLE CHEST - 1 VIEW  COMPARISON:  08/19/2013  FINDINGS: The patient has been intubated with the endotracheal tube tip approximately 4.5 cm above the carina. Gastric decompression tube enters the proximal stomach. Lungs show stable diffuse interstitial edema/CHF with probable small bilateral pleural effusions and stable dense atelectasis at the left base.  IMPRESSION: Endotracheal tube placement with tip approximately 4.5 cm  above the carina. Stable diffuse  interstitial edema, left lower lobe consolidation and probable small bilateral pleural effusions.   Electronically Signed   By: Aletta Edouard M.D.   On: 08/19/2013 09:52   Dg Chest Port 1 View  08/19/2013   CLINICAL DATA:  Stroke, hypertension, diabetes, hyperlipidemia, coronary artery disease post MI, end-stage renal disease on dialysis, ovarian cancer  EXAM: PORTABLE CHEST - 1 VIEW  COMPARISON:  Portable exam 0122 hr compared to 06/27/2013  FINDINGS: Enlargement of cardiac silhouette post CABG and MVR.  Tortuous thoracic aorta with atherosclerotic calcification.  Pulmonary vascular congestion.  Diffuse interstitial infiltrates consistent with pulmonary edema and CHF.  Bibasilar effusions and atelectasis.  Unable to exclude coexistent consolidation in LEFT lower lobe.  No pneumothorax.  Bones unremarkable.  IMPRESSION: CHF with bibasilar effusions and atelectasis, unable to exclude consolidation in LEFT lower lobe.   Electronically Signed   By: Lavonia Dana M.D.   On: 08/19/2013 01:43    I have reviewed the patient's current medications.  Assessment/Plan: 1 ESRD vol xs on exam and CXR  Will do today and tomorrow to get vol off. ? Plans for PD 2 HTN lower vol 3 Anemia check Fe and get epo 4 HPTH need meds 5 CVA 6 Hx GIB 7 DM controlled. P HD, epo , check Fe.      LOS: 2 days   Jeneen Rinks L Devyn Griffing 08/20/2013,10:29 AM

## 2013-08-20 NOTE — H&P (Signed)
Neurology H&P Reason for Consult: Aphasia Referring Physician: molpus,   CC: Aphasia  History is obtained from: Husband  HPI: [name] is a 68 y.o. female who was last seen normal around 10:30 at night when she had some slurred speech. She subsequently resolved, but then as they're getting into bed again she had garbled speech and he decided to bring her into the emergency room. She was brought into the ER, and in the ER she was noticed to get significantly worse and became unresponsive for a brief period of time. She became globally aphasic and there is concern briefly that she was not protecting her airway, but her mental status once again improved to the point where she was not intubated.  She had open heart surgery done in early March, she had some GI bleeding done in early April which was cauterized. Husband reports no issues with bleeding since that time and her hemoglobin here is significantly higher than at discharge. She undergoes hemodialysis, but does have a peritoneal dialysis catheter in place.  The need for heart surgery in March was apparently found on evaluation for kidney transplant surgery and this surgery was undertaken in anticipation of kidney transplantation.  LKW: 10:30 PM tpa given?: Yes    ROS: A 14 point ROS was performed and is negative except as noted in the HPI.  Past Medical History  Diagnosis Date  . Diabetes mellitus   . Hypertension   . Hyperlipidemia   . Coronary artery disease   . Dialysis patient     T/Th/Sat [facility]-Dr Hinda Lenis, stops hemodialysis 11-28-2012, will do peritoneal dialysis  starting 12-01-2012   . Carpal tunnel syndrome   . Anemia   . Ovarian cancer 1995    [name]  . Colon polyps 01/2012    Per colonoscopy, Dr. Gala Romney  . CKD (chronic kidney disease)     peritoneal dialysis at home nightly.  . Transfusion history 03-04-13    Transfusion x1 unit a month ago-[facility]  . Stroke 2010    2010-"brief  weakness,tongue thickness,incoherent"-no residual affects.  . Mitral regurgitation   . Myocardial infarction 2009  . Peritonitis 06/23/2013    Family History: Unable to assess secondary to patient's altered mental status.   Social History: Tob: Unable to assess secondary to patient's altered mental status.   Exam: Current vital signs: BP 130/59  Pulse 83  Temp(Src) 98.5 F (36.9 C) (Axillary)  Resp 19  Ht 5\' 3"  (1.6 m)  Wt 79 kg (174 lb 2.6 oz)  BMI 30.86 kg/m2  SpO2 99% Vital signs in last 24 hours: Temp:  [98 F (36.7 C)-99.7 F (37.6 C)] 98.5 F (36.9 C) (05/29 1600) Pulse Rate:  [66-95] 83 (05/29 1800) Resp:  [0-28] 19 (05/29 1800) BP: (111-167)/(50-82) 130/59 mmHg (05/29 1800) SpO2:  [91 %-100 %] 99 % (05/29 1800) FiO2 (%):  [40 %] 40 % (05/29 1800) Weight:  [79 kg (174 lb 2.6 oz)] 79 kg (174 lb 2.6 oz) (05/29 1600)  General: in bed CV: irregular Abd: NT, ND, PD catheter in place.  Ext: wound on left knee, graft in right arm.  Mental Status: Patient is awake, she does not follow commands or answer questions.  Cranial Nerves: II: Does not blink to threat from right, does from left Pupils are equal, round, and reactive to light.  Discs are difficult to visualize. III,IV, VI: Left gaze preference V: Facial sensation is difficult to assess VII: Facial movement is notable for right facial paralysis VIII,  X, XI, XII: Unable to assess secondary to patient's altered mental status.  Motor: Tone is normal. Bulk is normal. 5/5 strength was present on the left side, she is able to hold the right leg against gravity, does not hold the right arm against gravity but appears to have significant weakness. Sensory: Sensation appears reduced on the right Cerebellar: Unable to assess secondary to patient's altered mental status.  Gait: Unable to assess secondary to patient's altered mental status.     I have reviewed labs in epic and the results pertinent to this  consultation are: Elevated creatinine Mildly elevated troponin  I have reviewed the images obtained: CT head-negative acute, CT angiogram left anterior division of the MCA occlusion.  Impression: 68 year old female with a history of renal failure undergoing workup for transplant. She is currently holding aspirin. I discussed with the patient's husband that she had multiple risk factors suggesting she may be a higher risk of a complication of IV TPA, however given the severity of her symptoms I did offer it. The patient's husband expressed understanding and wished to proceed with IV TPA.   Recommendations: 1. HgbA1c, fasting lipid panel 2. MRI, MRA  of the brain without contrast 3. Frequent neuro checks 4. Echocardiogram 5. Carotid dopplers 6. Prophylactic therapy-Antiplatelet med: Aspirin - dose 325mg  PO or 300mg  PR if 24-hour CT is negative 7. Risk factor modification 8. Telemetry monitoring 9. PT consult, OT consult, Speech consult 10. Will need nephrology consultation tomorrow, may need to delay dialysis by a day. 11. Holding bp meds for now 12. ICU hyperglycemia protocol.    Roland Rack, MD [facility] [phone]  If 7pm- 7am, please page neurology on call as listed in [place].   This patient is critically ill and at significant risk of neurological worsening, death and care requires constant monitoring of vital signs, hemodynamics,respiratory and cardiac monitoring, neurological assessment, discussion with family, other specialists and medical decision making of high complexity. I spent 60 minutes of neurocritical care time  in the care of  this patient.  Roland Rack, MD [facility] [phone]  If 7pm- 7am, please page neurology on call as listed in [place]. 08/20/2013  7:01 PM

## 2013-08-20 NOTE — Progress Notes (Signed)
RT transported patient to/from CT on the ventilator with no complications.

## 2013-08-20 NOTE — Progress Notes (Signed)
[facility] Progress Note Patient Name: [name] DOB: [birth year] MRN: [id number]  Date of Service  08/20/2013   HPI/Events of Note   Pt needing to go to MRI.  She reports feeling anxious in closed spaces.  eICU Interventions  Will give 1 mg ativan IV prior to going for MRI.   Intervention Category Minor Interventions: Agitation / anxiety - evaluation and management  Shyleigh Daughtry 08/20/2013, 8:10 PM

## 2013-08-20 NOTE — Progress Notes (Signed)
Stroke Team Progress Note  HISTORY [name] is a 68 y.o. female who was last seen normal around 10:30 at night 08/18/2013 when she had some slurred speech. She subsequently resolved, but then as they're getting into bed again she had garbled speech and he decided to bring her into the emergency room. She was brought into the ER, and in the ER she was noticed to get significantly worse and became unresponsive for a brief period of time. She became globally aphasic and there is concern briefly that she was not protecting her airway, but her mental status once again improved to the point where she was not intubated.   She had open heart surgery done in early March, she had some GI bleeding done in early April which was cauterized. Husband reports no issues with bleeding since that time and her hemoglobin here is significantly higher than at discharge. She undergoes hemodialysis, but does have a peritoneal dialysis catheter in place. The need for heart surgery in March was apparently found on evaluation for kidney transplant surgery and this surgery was undertaken in anticipation of kidney transplantation.   CTA demonstrated L M2 occlusion. Patient was administered TPA. She was taken to neuro intervention where she had near complete revascularization of L MCA M2-M3 branches of the superior division of L MCA with IA tPA.  She was admitted to the neuro ICU for further evaluation and treatment.  SUBJECTIVE Her husband is at at the bedside. He states she was on the ventilator in the recent past with difficulties extubating due to anxiety. She is on xanax at home.  OBJECTIVE Most recent Vital Signs: Filed Vitals:   08/20/13 0600 08/20/13 0700 08/20/13 0754 08/20/13 0800  BP: 119/53 126/55  133/65  Pulse: 79 78  82  Temp:   99.7 F (37.6 C)   TempSrc:   Oral   Resp: 14 16  19   Height:      Weight:      SpO2: 98% 99%  97%   CBG (last 3)   Recent Labs  08/20/13 0023 08/20/13 0351  08/20/13 0751  GLUCAP 108* 93 94    IV Fluid Intake:   . sodium chloride Stopped (08/19/13 0932)  . sodium chloride 10 mL/hr at 08/20/13 0600  . niCARDipine Stopped (08/20/13 0330)  . propofol Stopped (08/19/13 0725)    MEDICATIONS  . antiseptic oral rinse  15 mL Mouth Rinse QID  . chlorhexidine  15 mL Mouth Rinse BID  . insulin aspart  2-6 Units Subcutaneous 6 times per day  . pantoprazole (PROTONIX) IV  40 mg Intravenous Q12H   PRN:  acetaminophen, acetaminophen, labetalol, ondansetron (ZOFRAN) IV  Diet:  NPO  Activity:  Bedrest DVT Prophylaxis:  SCDs   CLINICALLY SIGNIFICANT STUDIES Basic Metabolic Panel:   Recent Labs Lab 08/19/13 0554 08/20/13 0330  NA 142 141  K 4.0 5.0  CL 101 102  CO2 24 22  GLUCOSE 244* 109*  BUN 20 24*  CREATININE 5.17* 5.93*  CALCIUM 8.3* 8.2*  MG  --  1.9  PHOS  --  5.8*   Liver Function Tests:   Recent Labs Lab 08/19/13 0003 08/20/13 0330  AST 18 38*  ALT 15 10  ALKPHOS 76 56  BILITOT 0.3 0.3  PROT 7.5 6.1  ALBUMIN 2.8* 2.1*   CBC:   Recent Labs Lab 08/19/13 0554 08/20/13 0330  WBC 4.9 6.3  NEUTROABS 4.1 5.0  HGB 8.6* 8.6*  HCT 28.1* 27.8*  MCV 102.9* 101.5*  PLT 212 214   Coagulation:   Recent Labs Lab 08/19/13 0003 08/20/13 0330  LABPROT 13.9 15.0  INR 1.09 1.21   Cardiac Enzymes:   Recent Labs Lab 08/19/13 0554 08/19/13 1339  TROPONINI <0.30 <0.30   Urinalysis: No results found for this basename: COLORURINE, APPERANCEUR, LABSPEC, PHURINE, GLUCOSEU, HGBUR, BILIRUBINUR, KETONESUR, PROTEINUR, UROBILINOGEN, NITRITE, LEUKOCYTESUR,  in the last 168 hours Lipid Panel    Component Value Date/Time   CHOL 109 08/19/2013 0554   TRIG 79 08/19/2013 0554   HDL 51 08/19/2013 0554   CHOLHDL 2.1 08/19/2013 0554   VLDL 16 08/19/2013 0554   LDLCALC 42 08/19/2013 0554   HgbA1C  Lab Results  Component Value Date   HGBA1C 6.7* 08/19/2013    Urine Drug Screen:   No results found for this basename: labopia,   cocainscrnur,  labbenz,  amphetmu,  thcu,  labbarb    Alcohol Level: No results found for this basename: ETH,  in the last 168 hours    CT of the brain   08/20/2013 1. No acute large vessel territory infarct identified. 2. Stable 1.2 cm hyperdensity within the left parafalcine gray matter within the left parietal lobe. Finding is stable as compared to most recent CT, but new relative to initial CT performed prior to catheter directed arteriogram and tPA administration. It is unclear whether this opacity represents a small amount of blood products or is related to recent procedure. Continued followup recommended. 3. Increased hyperdensity along the falx as compared to initial CT from 08/19/2013, with decreased hyperdensity along the left tentorium. Again, it is unclear whether this represents a small  amount of subdural hemorrhage versus sequelae of recent intervention. 08/19/2013    1. New 1.2 cm hyperdensity within the left parafalcine gray matter of the left parietal lobe. Finding is concerning for possible new subarachnoid/intraparenchymal hemorrhage. 2. Increased hyperdensity along the falx and left tentorium. While this finding may be related to recent procedure, possible acute subdural hemorrhage could also have this appearance. A close interval followup CT would likely be helpful to further evaluate these findings.   08/19/2013    1. No acute intracranial pathology seen on CT. 2. Scattered small vessel ischemic microangiopathy.   CT Angio Head  08/19/2013    1. Occlusion of a left M2 branch supplying the anterior division of the left MCA artery territory as above. 2. No other proximal branch occlusion or high-grade flow-limiting stenosis identified within the anterior circulation.   CT Angio Neck 08/19/2013   1. Atherosclerotic plaque at the mid origin of the right internal carotid artery with associated short segment stenosis of approximately 50% by NASCET criteria. 2. Atherosclerotic plaque at the  origin of the left internal carotid artery with associated stenosis of approximately 30-40% by NASCET criteria. 3. No high-grade stenosis or vascular occlusion identified within the neck. 4. Moderate bilateral pleural effusions, right greater than left. 5. Patchy and ground-glass opacity within the partially visualized upper lobes, right greater than left. Findings are indeterminate, and may represent edema and/or infection. Clinical correlation with dedicated chest radiograph recommended. 6. Left supraclavicular adenopathy as above, of uncertain etiology, and may be reactive in nature.   Cerebral Angio 08/19/2013 S/P lt common carotid arteriogram followed by near complete revascularization of lt MCA M2-M 3branches of superior division of LT MCA with 15 mg of superselective IA TPA   MRI of the brain    2D Echocardiogram    CXR  08/20/2013 Mildly improved pulmonary edema most evident in the  left mid and lower lung. Persistent small effusions. No new abnormalities. Support apparatus remains well-positioned. 08/19/2013   CHF with bibasilar effusions and atelectasis, unable to exclude consolidation in LEFT lower lobe.  EKG  atrial fibrillation, rate 76. For complete results please see formal report.   Therapy Recommendations   Physical Exam   Middle aged 47 lady intubated. .Awake alert. Afebrile. Head is nontraumatic. Neck is supple without bruit. Hearing is normal. Cardiac exam no murmur or gallop. Lungs are clear to auscultation. Distal pulses are well felt. Neurological Exam : Awake alert and interactive. Follows simple midline and one-step commands well. Pupils equal reactive. Vision acuity and feels inadequate. Extraocular moments are full range without nystagmus. Face is symmetric. Tongue is midline. Able to move both upper extremities well against gravity left and greater than right. Mild right grip and intrinsic hand muscle weakness. Diminished fine finger movements on the right. Orbits  left over right upper extremity. no focal LE weakness.Planrars downgoing bilaterally.  ASSESSMENT [name] is a 68 y.o. female presenting with slurred speech that progressed to global aphasia with momentary loss of consciousness and right hemirparesis. Status post IV t-PA 08/19/2013 at 0052 followed by near complete revascularization of L MCA M2-M3 branches of the superior division of L MCA with IA tPA. Suspect a small left MCA infarct. Repeat CT does show left parafalcine post tPA asymptomatic hemorrhage.  Infarct likely embolic secondary to atrial fibrillation see on EKG.  On no antithrombotics prior to admission. Now on no antithrombotics given asymptomatic hemorrhage for secondary stroke prevention. Patient with resultant VDRF, mild right hand hemiparesis. Stroke work up underway.  Acute respiratory failure, CCM following  Remains intubated this am. Failed weaning x 2 yesterday thought to be related to anxiety.  Effusion  COPD. Uses 02  atrial fibrillation per admitting EKG, new finding. Old records indicate atrial arrythmia. Has not been on anticoagulation Hypertension. On cardene post intervention, now off.  Hyperlipidemia, LDL 42, on zocor 40 PTA, now on no statin as NPO, goal LDL < 70 for diabetics Diabetes, HgbA1c 5.6, goal < 7.0 CAD - MI Hx stroke 2010, no residual deficits  ESRD, on hemodialysis Tues, Thurs and Sat, [facility]. Renal following. Has peritoneal cath x 1 yr.  Anemia post tPA 12->8.6 Hx recent GIB requiring cautery Hx ovarian cancer, 1995, [facility], in remission Known post CABG L leg wound with dressing. Weekly dressing  Hospital day # 2  TREATMENT/PLAN  Plan to add aspirin for secondary stroke prevention once post tPA hemorrhage becomes isodense. Consider anticoagulation given atrial fibrillation at time of discharge.   SBP goal < 160  Add lovenox for VTE prophylaxis tomorrow if pt remains stable.  Stroke workup : MRI, 2D  Add xanax 0.5 mg now.  Extubate in 1 hr. Add nightly xanax as at home. Dr. Leonie Man discussed with Dr. Titus Mould  CCM to manage medical issues, renal consult  SIGNED Burnetta Sabin, MSN, RN, ANVP-BC, ANP-BC, GNP-BC [facility] Pager: [phone] 08/20/2013 8:56 AM  This patient is critically ill and at significant risk of neurological worsening, death and care requires constant monitoring of vital signs, hemodynamics,respiratory and cardiac monitoring,review of multiple databases, neurological assessment, discussion with family, other specialists and medical decision making of high complexity. I spent 30 minutes of neurocritical care time  in the care of  this patient.  I have personally obtained a history, examined the patient, evaluated imaging results, and formulated the assessment and plan of care. I agree with the  above. Antony Contras, MD  To contact Stroke Continuity provider, please refer to [email or web address] After hours, contact General Neurology

## 2013-08-20 NOTE — Progress Notes (Signed)
[facility]  Patient Status: Active (receiving services up to time of hospitalization)  [facility] is providing the following services: RN and PT. Wound care  If patient discharges after hours, please call [phone].   [facility] 08/20/2013, 10:05 AM

## 2013-08-20 NOTE — Consult Note (Signed)
PULMONARY / CRITICAL CARE MEDICINE   Name: [name] MRN: [id number] DOB: 08-23-1945    ADMISSION DATE:  08/18/2013 CONSULTATION DATE:  5/28  REFERRING MD : Neuro PRIMARY SERVICE: Neuro  CHIEF COMPLAINT:VDRF post stroke and intervention  BRIEF PATIENT DESCRIPTION:  [name] with recent CABG for CAD in preparation for renal transplant who was in usual state of health until 5/27 2315 at which time her husband noted slurred speech that cleared quickly. They were going to bed she demonstrated garbled speech and at that time they came to [facility] ED. She further demonstrated decreased LOC but she protected her airway. Neuro took her to IR for TPA intraarterial LMCA. She was left intubated and transported to NSICU and PCCM asked to manage her ICU care. Note she has a peritoneal HD cath and rt arm bicep graft. Reportedly she gets HD T, TH, Sat via av graft. Note she has a hx of recent GI bleed requiring cautery.   SIGNIFICANT EVENTS / STUDIES:  5/28 TPA 5/28-poor Tv on wean  LINES / TUBES: 5/28 ET>> 5/28 Lt fem sheath aline>>5/28 5/28 Rt fem v sheath>>>5/28 5/28 oggt>>  CULTURES: none  ANTIBIOTICS: none  SUBJECTIVE:  Awake, anxious  VITAL SIGNS: Temp:  [97.4 F (36.3 C)-99.7 F (37.6 C)] 99.7 F (37.6 C) (05/29 0754) Pulse Rate:  [70-82] 82 (05/29 0800) Resp:  [14-19] 19 (05/29 0800) BP: (112-151)/(49-84) 133/65 mmHg (05/29 0800) SpO2:  [96 %-100 %] 97 % (05/29 0800) Arterial Line BP: (127-145)/(43-46) 145/46 mmHg (05/28 1500) FiO2 (%):  [40 %-50 %] 40 % (05/29 0801) HEMODYNAMICS:   VENTILATOR SETTINGS: Vent Mode:  [-] PSV FiO2 (%):  [40 %-50 %] 40 % Set Rate:  [16 bmp] 16 bmp Vt Set:  [450 mL] 450 mL PEEP:  [5 cmH20] 5 cmH20 Pressure Support:  [5 cmH20-10 cmH20] 5 cmH20 Plateau Pressure:  [24 cmH20-31 cmH20] 25 cmH20 INTAKE / OUTPUT: Intake/Output     05/28 0701 - 05/29 0700 05/29 0701 - 05/30 0700   I.V. (mL/kg) 1247.1 (15.7) 10 (0.1)   Total Intake(mL/kg)  1247.1 (15.7) 10 (0.1)   Urine (mL/kg/hr) 20 (0)    Total Output 20     Net +1227.1 +10          PHYSICAL EXAMINATION: General:  WNWDWF sedated on vent Neuro:  Sedated,perr, poor hand grip on rt HEENT:  jvd wnl, slight increased Cardiovascular: HSR RRR murmur. Old cabg scar noted Lungs:  Coarse mild diffuse Abdomen:  Peritoneal cath in place and site unremarkable, BS wnl Musculoskeletal: Intact Skin:  Rt bicep av graft + bruit/trill    LABS:  CBC  Recent Labs Lab 08/19/13 0003 08/19/13 0020 08/19/13 0554 08/20/13 0330  WBC 5.9  --  4.9 6.3  HGB 10.3* 12.6 8.6* 8.6*  HCT 35.1* 37.0 28.1* 27.8*  PLT 257  --  212 214   Coag's  Recent Labs Lab 08/19/13 0003 08/20/13 0330  APTT 28 29  INR 1.09 1.21   BMET  Recent Labs Lab 08/19/13 0003 08/19/13 0020 08/19/13 0554 08/20/13 0330  NA 144 142 142 141  K 4.0 3.7 4.0 5.0  CL 100 101 101 102  CO2 27  --  24 22  BUN 17 16 20  24*  CREATININE 4.98* 5.20* 5.17* 5.93*  GLUCOSE 243* 247* 244* 109*   Electrolytes  Recent Labs Lab 08/19/13 0003 08/19/13 0554 08/20/13 0330  CALCIUM 8.9 8.3* 8.2*  MG  --   --  1.9  PHOS  --   --  5.8*   Sepsis Markers  Recent Labs Lab 08/19/13 0013  LATICACIDVEN 1.27   ABG  Recent Labs Lab 08/19/13 0910  PHART 7.388  PCO2ART 43.9  PO2ART 73.0*   Liver Enzymes  Recent Labs Lab 08/19/13 0003 08/20/13 0330  AST 18 38*  ALT 15 10  ALKPHOS 76 56  BILITOT 0.3 0.3  ALBUMIN 2.8* 2.1*   Cardiac Enzymes  Recent Labs Lab 08/19/13 0554 08/19/13 1339  TROPONINI <0.30 <0.30   Glucose  Recent Labs Lab 08/19/13 1200 08/19/13 1624 08/19/13 2011 08/20/13 0023 08/20/13 0351 08/20/13 0751  GLUCAP 150* 113* 105* 108* 93 94    Imaging Ct Angio Head W/cm &/or Wo Cm  08/19/2013   CLINICAL DATA:  Code stroke. Concern for left MCA territory infarct.  EXAM: CT ANGIOGRAPHY HEAD AND NECK  TECHNIQUE: Multidetector CT imaging of the head and neck was performed using  the standard protocol during bolus administration of intravenous contrast. Multiplanar CT image reconstructions and MIPs were obtained to evaluate the vascular anatomy. Carotid stenosis measurements (when applicable) are obtained utilizing NASCET criteria, using the distal internal carotid diameter as the denominator.  CONTRAST:  65mL OMNIPAQUE IOHEXOL 350 MG/ML SOLN  COMPARISON:  Prior CT performed earlier on the same day as well as prior MRI from 08/20/2010.  FINDINGS: CTA HEAD FINDINGS  The visualized petrous segments of the internal carotid arteries are well opacified bilaterally. Scattered calcified plaque seen within the cavernous segments bilaterally without definite high-grade flow-limiting stenosis. The supra clinoid segments of the internal carotid arteries are well opacified.  Mild multi focal atheromatous irregularity noted within the left M1 segment. No proximal branch occlusion or high-grade flow-limiting stenosis. There is occlusion of an anterior left M2 branch with absence of flow within the distal MCA branches of the anterior division of the left MCA artery (series 409, image 240). Occlusion occurs within the sylvian fissure on image 233. Flow is seen within the posterior division of the MCA territory.  The right M1 segment is well opacified without proximal branch occlusion or high-grade flow-limiting stenosis. Distal right MCA branches are not within normal limits.  A1 segments are symmetric bilaterally. Anterior communicating artery is normal. Anterior cerebral arteries are well opacified.  The vertebral arteries are codominant. Posterior inferior cerebellar arteries are within normal limits. Vertebrobasilar junction and basilar artery are well opacified. Posterior cerebral arteries are within normal limits. Superior cerebellar arteries are normal. Fetal origin of the left PCA noted. Mild multi focal irregularity involving the distal left PCA artery noted, similar to prior.  No aneurysm seen  within the intracranial circulation.  Review of the MIP images confirms the above findings.  CTA NECK FINDINGS  The visualize aortic arch is of normal caliber with normal 3 vessel morphology. Scattered atherosclerotic plaques noted within the aortic arch. No high-grade proximal stenosis seen at the origin of the great vessels. There is calcified and noncalcified plaque within the proximal left subclavian artery with associated approximately 30-40% of stenosis (series 409, image 37). The left subclavian artery is well opacified distally. The right subclavian artery is within normal limits.  The common carotid arteries are of symmetric caliber bilaterally without evidence of inclusion or dissection. Calcified atheromatous plaque present within the mid left common carotid artery without associated high-grade stenosis by NASCET criteria (series 409, image 80). Heavy atherosclerotic plaque noted about the carotid bifurcations bilaterally.  There is calcified and noncalcified plaque at the origin of the internal carotid arteries bilaterally, right worse than left. On the right,  there is associated irregular short segment stenosis of approximately 50% by NASCET criteria. On the left, there is associated stenosis of approximately 30-40% by NASCET criteria. The internal carotid arteries are well opacified distally. Tortuosity of the right internal carotid artery noted.  External carotid arteries and their branches are within normal limits.  Vertebral arteries are of equal caliber without evidence of high-grade flow-limiting stenosis, occlusion, or dissection.  Moderate bilateral pleural effusions are partially visualized, right greater than left. There is patchy and ground-glass opacity within the partially visualized upper lobes, right greater than left. Findings are indeterminate, but may represent edema and/or infection.  Scattered mildly enlarged left-sided supraclavicular lymph nodes measuring up to 1 cm in short axis  are present (series 409, image 55).  No acute osseous abnormality within the neck. Moderate multilevel degenerative changes noted within the visualized spine.  Review of the MIP images confirms the above findings.  IMPRESSION: CTA HEAD:  1. Occlusion of a left M2 branch supplying the anterior division of the left MCA artery territory as above. 2. No other proximal branch occlusion or high-grade flow-limiting stenosis identified within the anterior circulation.  CTA NECK:  1. Atherosclerotic plaque at the mid origin of the right internal carotid artery with associated short segment stenosis of approximately 50% by NASCET criteria. 2. Atherosclerotic plaque at the origin of the left internal carotid artery with associated stenosis of approximately 30-40% by NASCET criteria. 3. No high-grade stenosis or vascular occlusion identified within the neck. 4. Moderate bilateral pleural effusions, right greater than left. 5. Patchy and ground-glass opacity within the partially visualized upper lobes, right greater than left. Findings are indeterminate, and may represent edema and/or infection. Clinical correlation with dedicated chest radiograph recommended. 6. Left supraclavicular adenopathy as above, of uncertain etiology, and may be reactive in nature. Critical Value/emergent results were called by telephone at the time of interpretation on 08/19/2013 at 1:05 AM to Dr. Roland Rack , who verbally acknowledged these results.   Electronically Signed   By: Jeannine Boga M.D.   On: 08/19/2013 01:48   Ct Head Wo Contrast  08/20/2013   CLINICAL DATA:  Stroke.  24 hr status post tPA.  EXAM: CT HEAD WITHOUT CONTRAST  TECHNIQUE: Contiguous axial images were obtained from the base of the skull through the vertex without intravenous contrast.  COMPARISON:  Prior head CT from 08/19/2013.  FINDINGS: There is a persistent 1.2 cm hyperdensity seen within the left parafalcine gray matter of the left parietal lobe (series 2,  image 21). This hyperdensity is grossly stable relative to most recent CT, and may represent a small amount of hemorrhage. Additionally, increased hyperdensity along the falx is also similar. Hyperdensity along the left tentorium has largely resolved. Is unclear whether these opacities represent small amount of blood products or are related to recent catheter directed angiogram.  No definite acute large vessel territory infarct identified. Focal hypodensity overlying the cortical gray matter of the left frontal lobe on image 18 is favored to reflect volume averaging with the adjacent cortical sulcus. No mass lesion or midline shift. No hydrocephalus. Atrophy with chronic microvascular ischemic changes again noted.  Calvarium is intact. Orbits are unremarkable. Paranasal sinuses and mastoid air cells are clear.  IMPRESSION: 1. No acute large vessel territory infarct identified. 2. Stable 1.2 cm hyperdensity within the left parafalcine gray matter within the left parietal lobe. Finding is stable as compared to most recent CT, but new relative to initial CT performed prior to catheter directed arteriogram and  tPA administration. It is unclear whether this opacity represents a small amount of blood products or is related to recent procedure. Continued followup recommended. 3. Increased hyperdensity along the falx as compared to initial CT from 08/19/2013, with decreased hyperdensity along the left tentorium. Again, it is unclear whether this represents a small amount of subdural hemorrhage versus sequelae of recent intervention. Continued attention on followup recommended.   Electronically Signed   By: Jeannine Boga M.D.   On: 08/20/2013 02:49   Ct Head Wo Contrast  08/19/2013   CLINICAL DATA:  Post recannulization, tPA  EXAM: CT HEAD WITHOUT CONTRAST  TECHNIQUE: Contiguous axial images were obtained from the base of the skull through the vertex without intravenous contrast.  COMPARISON:  Prior CTA performed  earlier on the same day.  FINDINGS: Atrophy with chronic microvascular ischemic changes are again seen.  A new 1.2 cm date hyperdensity is seen within the left parafalcine Gray matter adjacent to the posterior falx and the left parietal lobe (series 2, image 22). Finding is suspicious for possible small acute parenchymal and/or subarachnoid hemorrhage. Additionally, there is increase hyperdensity along the falx and left tentorium cerebella as compared to prior exam, suspicious for possible small volume acute subdural hemorrhage.  No definite acute large vessel territory infarct identified. No mass lesion or midline shift.  Calvarium is intact.  Orbits are normal.  Scattered opacity present within the ethmoidal air cells bilaterally. No mastoid effusion.  IMPRESSION: 1. New 1.2 cm hyperdensity within the left parafalcine gray matter of the left parietal lobe. Finding is concerning for possible new subarachnoid/intraparenchymal hemorrhage. 2. Increased hyperdensity along the falx and left tentorium. While this finding may be related to recent procedure, possible acute subdural hemorrhage could also have this appearance. A close interval followup CT would likely be helpful to further evaluate these findings.  Critical Value/emergent results were called by telephone at the time of interpretation on 08/19/2013 at 5:40 AM to Dr. Kathrynn Speed, who verbally acknowledged these results.   Electronically Signed   By: Jeannine Boga M.D.   On: 08/19/2013 05:46   Ct Head (brain) Wo Contrast  08/19/2013   CLINICAL DATA:  Right-sided weakness and dysphagia.  Unresponsive.  EXAM: CT HEAD WITHOUT CONTRAST  TECHNIQUE: Contiguous axial images were obtained from the base of the skull through the vertex without intravenous contrast.  COMPARISON:  CT of the head performed 08/19/2010, and MRI/MRA of the brain performed 08/20/2010  FINDINGS: There is no evidence of acute infarction, mass lesion, or intra- or extra-axial  hemorrhage on CT.  Scattered periventricular and subcortical white matter change likely reflects small vessel ischemic microangiopathy.  The posterior fossa, including the cerebellum, brainstem and fourth ventricle, is within normal limits. The third and lateral ventricles, and basal ganglia are unremarkable in appearance. The cerebral hemispheres demonstrate grossly normal gray-white differentiation. No mass effect or midline shift is seen.  There is no evidence of fracture; visualized osseous structures are unremarkable in appearance. The orbits are within normal limits. The paranasal sinuses and mastoid air cells are well-aerated. No significant soft tissue abnormalities are seen.  IMPRESSION: 1. No acute intracranial pathology seen on CT. 2. Scattered small vessel ischemic microangiopathy.  These results were called by telephone at the time of interpretation on 08/19/2013 at 12:25 AM to Dr. Orvilla Cornwall, who verbally acknowledged these results.   Electronically Signed   By: Garald Balding M.D.   On: 08/19/2013 00:26   Ct Angio Neck W/cm &/or Wo/cm  08/19/2013  CLINICAL DATA:  Code stroke. Concern for left MCA territory infarct.  EXAM: CT ANGIOGRAPHY HEAD AND NECK  TECHNIQUE: Multidetector CT imaging of the head and neck was performed using the standard protocol during bolus administration of intravenous contrast. Multiplanar CT image reconstructions and MIPs were obtained to evaluate the vascular anatomy. Carotid stenosis measurements (when applicable) are obtained utilizing NASCET criteria, using the distal internal carotid diameter as the denominator.  CONTRAST:  11mL OMNIPAQUE IOHEXOL 350 MG/ML SOLN  COMPARISON:  Prior CT performed earlier on the same day as well as prior MRI from 08/20/2010.  FINDINGS: CTA HEAD FINDINGS  The visualized petrous segments of the internal carotid arteries are well opacified bilaterally. Scattered calcified plaque seen within the cavernous segments bilaterally without definite  high-grade flow-limiting stenosis. The supra clinoid segments of the internal carotid arteries are well opacified.  Mild multi focal atheromatous irregularity noted within the left M1 segment. No proximal branch occlusion or high-grade flow-limiting stenosis. There is occlusion of an anterior left M2 branch with absence of flow within the distal MCA branches of the anterior division of the left MCA artery (series 409, image 240). Occlusion occurs within the sylvian fissure on image 233. Flow is seen within the posterior division of the MCA territory.  The right M1 segment is well opacified without proximal branch occlusion or high-grade flow-limiting stenosis. Distal right MCA branches are not within normal limits.  A1 segments are symmetric bilaterally. Anterior communicating artery is normal. Anterior cerebral arteries are well opacified.  The vertebral arteries are codominant. Posterior inferior cerebellar arteries are within normal limits. Vertebrobasilar junction and basilar artery are well opacified. Posterior cerebral arteries are within normal limits. Superior cerebellar arteries are normal. Fetal origin of the left PCA noted. Mild multi focal irregularity involving the distal left PCA artery noted, similar to prior.  No aneurysm seen within the intracranial circulation.  Review of the MIP images confirms the above findings.  CTA NECK FINDINGS  The visualize aortic arch is of normal caliber with normal 3 vessel morphology. Scattered atherosclerotic plaques noted within the aortic arch. No high-grade proximal stenosis seen at the origin of the great vessels. There is calcified and noncalcified plaque within the proximal left subclavian artery with associated approximately 30-40% of stenosis (series 409, image 37). The left subclavian artery is well opacified distally. The right subclavian artery is within normal limits.  The common carotid arteries are of symmetric caliber bilaterally without evidence of  inclusion or dissection. Calcified atheromatous plaque present within the mid left common carotid artery without associated high-grade stenosis by NASCET criteria (series 409, image 80). Heavy atherosclerotic plaque noted about the carotid bifurcations bilaterally.  There is calcified and noncalcified plaque at the origin of the internal carotid arteries bilaterally, right worse than left. On the right, there is associated irregular short segment stenosis of approximately 50% by NASCET criteria. On the left, there is associated stenosis of approximately 30-40% by NASCET criteria. The internal carotid arteries are well opacified distally. Tortuosity of the right internal carotid artery noted.  External carotid arteries and their branches are within normal limits.  Vertebral arteries are of equal caliber without evidence of high-grade flow-limiting stenosis, occlusion, or dissection.  Moderate bilateral pleural effusions are partially visualized, right greater than left. There is patchy and ground-glass opacity within the partially visualized upper lobes, right greater than left. Findings are indeterminate, but may represent edema and/or infection.  Scattered mildly enlarged left-sided supraclavicular lymph nodes measuring up to 1 cm in short axis  are present (series 409, image 55).  No acute osseous abnormality within the neck. Moderate multilevel degenerative changes noted within the visualized spine.  Review of the MIP images confirms the above findings.  IMPRESSION: CTA HEAD:  1. Occlusion of a left M2 branch supplying the anterior division of the left MCA artery territory as above. 2. No other proximal branch occlusion or high-grade flow-limiting stenosis identified within the anterior circulation.  CTA NECK:  1. Atherosclerotic plaque at the mid origin of the right internal carotid artery with associated short segment stenosis of approximately 50% by NASCET criteria. 2. Atherosclerotic plaque at the origin of  the left internal carotid artery with associated stenosis of approximately 30-40% by NASCET criteria. 3. No high-grade stenosis or vascular occlusion identified within the neck. 4. Moderate bilateral pleural effusions, right greater than left. 5. Patchy and ground-glass opacity within the partially visualized upper lobes, right greater than left. Findings are indeterminate, and may represent edema and/or infection. Clinical correlation with dedicated chest radiograph recommended. 6. Left supraclavicular adenopathy as above, of uncertain etiology, and may be reactive in nature. Critical Value/emergent results were called by telephone at the time of interpretation on 08/19/2013 at 1:05 AM to Dr. Roland Rack , who verbally acknowledged these results.   Electronically Signed   By: Jeannine Boga M.D.   On: 08/19/2013 02:20   Dg Chest Port 1 View  08/20/2013   CLINICAL DATA:  Intubated patient.  Pleural effusions.  EXAM: PORTABLE CHEST - 1 VIEW  COMPARISON:  08/19/2013  FINDINGS: Endotracheal tube and nasogastric tube are stable and well positioned.  Changes from cardiac surgery are stable.  No mediastinal widening.  Hazy perihilar and lung base opacity as well as interstitial thickening is noted, improved in the left mid lung from the prior study, but otherwise stable, consistent with a combination of pulmonary edema and small bilateral effusions.  No pneumothorax.  IMPRESSION: Mildly improved pulmonary edema most evident in the left mid and lower lung. Persistent small effusions. No new abnormalities. Support apparatus remains well-positioned.   Electronically Signed   By: Lajean Manes M.D.   On: 08/20/2013 08:02   Dg Chest Port 1 View  08/19/2013   CLINICAL DATA:  Cerebral infarction and respiratory failure.  EXAM: PORTABLE CHEST - 1 VIEW  COMPARISON:  08/19/2013  FINDINGS: The patient has been intubated with the endotracheal tube tip approximately 4.5 cm above the carina. Gastric decompression  tube enters the proximal stomach. Lungs show stable diffuse interstitial edema/CHF with probable small bilateral pleural effusions and stable dense atelectasis at the left base.  IMPRESSION: Endotracheal tube placement with tip approximately 4.5 cm above the carina. Stable diffuse interstitial edema, left lower lobe consolidation and probable small bilateral pleural effusions.   Electronically Signed   By: Aletta Edouard M.D.   On: 08/19/2013 09:52   Dg Chest Port 1 View  08/19/2013   CLINICAL DATA:  Stroke, hypertension, diabetes, hyperlipidemia, coronary artery disease post MI, end-stage renal disease on dialysis, ovarian cancer  EXAM: PORTABLE CHEST - 1 VIEW  COMPARISON:  Portable exam 0122 hr compared to 06/27/2013  FINDINGS: Enlargement of cardiac silhouette post CABG and MVR.  Tortuous thoracic aorta with atherosclerotic calcification.  Pulmonary vascular congestion.  Diffuse interstitial infiltrates consistent with pulmonary edema and CHF.  Bibasilar effusions and atelectasis.  Unable to exclude coexistent consolidation in LEFT lower lobe.  No pneumothorax.  Bones unremarkable.  IMPRESSION: CHF with bibasilar effusions and atelectasis, unable to exclude consolidation in LEFT lower lobe.  Electronically Signed   By: Lavonia Dana M.D.   On: 08/19/2013 01:43    ASSESSMENT / PLAN:  PULMONARY A: VDRF, effusions, r/o edema as issue 5/29 P:   Weaned this am PS 10, to 5 then , rsbi high, abg just back ph 7.24 = NO Extubation planned pcxr for volume in am  Would consider hd today , fluid removal then repeat weaning If fails weaning after HD would ct chest eval for effusion contribution   CARDIOVASCULAR A: Hx of CAD with recent CABG     HTN P:  Cardene drip for BP control for Stroke likely can dc this after hd? tele  RENAL A:  ESRD with HD via rt bicep graft T, TH, Sat      Existing peritoneal HD cath in place x 1 year      On transplant path  P:   Failed weaning, k up, volume up? On pcxr   Will call renal to consider hd  GASTROINTESTINAL A:  Hx of GIB P:   ppi bid as prior GI bleed h/o Add TF, no extubation planned  HEMATOLOGIC A:  Hx of GIB       TPA administration 5/28 P:  PPI increased to q 12 h scd  INFECTIOUS A: No acute issue P:  Follow fever curve  ENDOCRINE A: DM P:   SSI Hgb a 1c  NEUROLOGIC A:  Post left mca stroke , with TPA 5/28. Rt sided weakness on exam prior to IR procedure Residual small bleed P:   RASS goal: 0  Frequent neuro checks per protocol. Cardene drip to goals per neuro , consider higher goals Ct head repeated, per neuro  TODAY'S SUMMARY: Wean failure, consider hd now for weaning, may need ct chest for effusion evaluation  I have personally obtained a history, examined the patient, evaluated laboratory and imaging results, formulated the assessment and plan and placed orders. CRITICAL CARE: The patient is critically ill with multiple organ systems failure and requires high complexity decision making for assessment and support, frequent evaluation and titration of therapies, application of advanced monitoring technologies and extensive interpretation of multiple databases. Critical Care Time devoted to patient care services described in this note is 30 minutes.   Lavon Paganini. Titus Mould, MD, [place] Pgr: [facility]  Pulmonary and [place] Pager: [phone]  08/20/2013, 9:29 AM

## 2013-08-20 NOTE — Progress Notes (Signed)
PT Cancellation Note  Patient Details Name: [name] MRN: [id number] DOB: 1946/03/06   Cancelled Treatment:    Reason Eval/Treat Not Completed: Patient not medically ready (on ventilator and bedrest.)   Duncan Dull 08/20/2013, 7:51 AM Alben Deeds, PT DPT  [phone]

## 2013-08-20 NOTE — Anesthesia Postprocedure Evaluation (Signed)
  Anesthesia Post-op Note  Patient: [name]  Procedure(s) Performed: Procedure(s): RADIOLOGY WITH ANESTHESIA (N/A)  Patient Location: ICU  Anesthesia Type:General  Level of Consciousness: sedated and Patient remains intubated per anesthesia plan  Airway and Oxygen Therapy: Patient remains intubated per anesthesia plan  Post-op Pain: none  Post-op Assessment: Post-op Vital signs reviewed  Post-op Vital Signs: Reviewed  Last Vitals:  Filed Vitals:   08/20/13 0754  BP:   Pulse:   Temp: 37.6 C  Resp:     Complications: No apparent anesthesia complications

## 2013-08-21 DIAGNOSIS — N186 End stage renal disease: Secondary | ICD-10-CM

## 2013-08-21 DIAGNOSIS — I5032 Chronic diastolic (congestive) heart failure: Secondary | ICD-10-CM

## 2013-08-21 LAB — COMPREHENSIVE METABOLIC PANEL
ALT: <5 U/L (ref 0–35)
AST: 11 U/L (ref 0–37)
Albumin: 2.1 g/dL — ABNORMAL LOW (ref 3.5–5.2)
Alkaline Phosphatase: 69 U/L (ref 39–117)
BUN: 23 mg/dL (ref 6–23)
CO2: 28 meq/L (ref 19–32)
CREATININE: 3.77 mg/dL — AB (ref 0.50–1.10)
Calcium: 8.4 mg/dL (ref 8.4–10.5)
Chloride: 98 mEq/L (ref 96–112)
GFR, EST AFRICAN AMERICAN: 13 mL/min — AB (ref >90)
GFR, EST NON AFRICAN AMERICAN: 11 mL/min — AB (ref >90)
GLUCOSE: 194 mg/dL — AB (ref 70–99)
Potassium: 3.6 mEq/L — ABNORMAL LOW (ref 3.7–5.3)
Sodium: 140 mEq/L (ref 137–147)
Total Bilirubin: 0.4 mg/dL (ref 0.3–1.2)
Total Protein: 6.2 g/dL (ref 6.0–8.3)

## 2013-08-21 LAB — GLUCOSE, CAPILLARY
GLUCOSE-CAPILLARY: 209 mg/dL — AB (ref 70–99)
GLUCOSE-CAPILLARY: 154 mg/dL — AB (ref 70–99)
Glucose-Capillary: 105 mg/dL — ABNORMAL HIGH (ref 70–99)
Glucose-Capillary: 117 mg/dL — ABNORMAL HIGH (ref 70–99)
Glucose-Capillary: 157 mg/dL — ABNORMAL HIGH (ref 70–99)
Glucose-Capillary: 184 mg/dL — ABNORMAL HIGH (ref 70–99)
Glucose-Capillary: 219 mg/dL — ABNORMAL HIGH (ref 70–99)

## 2013-08-21 LAB — CBC
HEMATOCRIT: 29.3 % — AB (ref 36.0–46.0)
HEMOGLOBIN: 8.9 g/dL — AB (ref 12.0–15.0)
MCH: 31.3 pg (ref 26.0–34.0)
MCHC: 30.4 g/dL (ref 30.0–36.0)
MCV: 103.2 fL — AB (ref 78.0–100.0)
PLATELETS: 196 10*3/uL (ref 150–400)
RBC: 2.84 MIL/uL — AB (ref 3.87–5.11)
RDW: 19 % — ABNORMAL HIGH (ref 11.5–15.5)
WBC: 5.4 10*3/uL (ref 4.0–10.5)

## 2013-08-21 LAB — BASIC METABOLIC PANEL
BUN: 9 mg/dL (ref 6–23)
CALCIUM: 7.9 mg/dL — AB (ref 8.4–10.5)
CO2: 25 meq/L (ref 19–32)
CREATININE: 2.72 mg/dL — AB (ref 0.50–1.10)
Chloride: 100 mEq/L (ref 96–112)
GFR calc Af Amer: 19 mL/min — ABNORMAL LOW (ref >90)
GFR calc non Af Amer: 17 mL/min — ABNORMAL LOW (ref >90)
Glucose, Bld: 130 mg/dL — ABNORMAL HIGH (ref 70–99)
Potassium: 3.5 mEq/L — ABNORMAL LOW (ref 3.7–5.3)
Sodium: 143 mEq/L (ref 137–147)

## 2013-08-21 LAB — PHOSPHORUS: Phosphorus: 4.3 mg/dL (ref 2.3–4.6)

## 2013-08-21 MED ORDER — SODIUM CHLORIDE 0.9 % IV SOLN
125.0000 mg | INTRAVENOUS | Status: DC
  Administered 2013-08-21: 125 mg via INTRAVENOUS
  Filled 2013-08-21: qty 10

## 2013-08-21 MED ORDER — MIDAZOLAM HCL 2 MG/2ML IJ SOLN: 1.0000 mg | INTRAMUSCULAR | Status: DC | PRN

## 2013-08-21 MED ORDER — ENOXAPARIN SODIUM 30 MG/0.3ML ~~LOC~~ SOLN
30.0000 mg | SUBCUTANEOUS | Status: DC
  Administered 2013-08-21 – 2013-08-22 (×2): 30 mg via SUBCUTANEOUS
  Filled 2013-08-21 (×3): qty 0.3

## 2013-08-21 MED ORDER — DARBEPOETIN ALFA-POLYSORBATE 150 MCG/0.3ML IJ SOLN
INTRAMUSCULAR | Status: AC
  Administered 2013-08-21: 150 ug via INTRAVENOUS
  Filled 2013-08-21: qty 0.3

## 2013-08-21 MED ORDER — ALPRAZOLAM 0.25 MG PO TABS
0.2500 mg | ORAL_TABLET | Freq: Every day | ORAL | Status: DC
  Administered 2013-08-22 – 2013-08-23 (×2): 0.25 mg via ORAL
  Filled 2013-08-21 (×2): qty 1

## 2013-08-21 MED ORDER — ACETAMINOPHEN 160 MG/5ML PO SOLN
650.0000 mg | Freq: Four times a day (QID) | ORAL | Status: DC | PRN
  Administered 2013-08-22: 650 mg via ORAL
  Filled 2013-08-21: qty 20.3

## 2013-08-21 MED ORDER — FENTANYL CITRATE 0.05 MG/ML IJ SOLN: 50.0000 ug | INTRAMUSCULAR | Status: DC | PRN

## 2013-08-21 MED ORDER — ENOXAPARIN SODIUM 40 MG/0.4ML ~~LOC~~ SOLN: 40.0000 mg | SUBCUTANEOUS | Status: DC

## 2013-08-21 MED ORDER — ALPRAZOLAM 0.25 MG PO TABS
0.2500 mg | ORAL_TABLET | Freq: Once | ORAL | Status: AC
  Administered 2013-08-21: 0.25 mg via ORAL
  Filled 2013-08-21: qty 1

## 2013-08-21 MED ORDER — ALPRAZOLAM 0.5 MG PO TABS
0.5000 mg | ORAL_TABLET | Freq: Every day | ORAL | Status: DC
  Administered 2013-08-21: 0.5 mg
  Filled 2013-08-21: qty 1

## 2013-08-21 MED ORDER — FENTANYL CITRATE 0.05 MG/ML IJ SOLN
50.0000 ug | INTRAMUSCULAR | Status: DC | PRN
  Administered 2013-08-21 (×3): 50 ug via INTRAVENOUS
  Filled 2013-08-21 (×3): qty 2

## 2013-08-21 MED ORDER — PANTOPRAZOLE SODIUM 40 MG PO PACK
40.0000 mg | PACK | Freq: Two times a day (BID) | ORAL | Status: DC
  Administered 2013-08-21 (×2): 40 mg
  Filled 2013-08-21 (×4): qty 20

## 2013-08-21 NOTE — Progress Notes (Signed)
PULMONARY / CRITICAL CARE MEDICINE   Name: [name] MRN: [id number] DOB: July 01, 1945    ADMISSION DATE:  08/18/2013 CONSULTATION DATE:  08/19/2013  REFERRING MD : Neuro PRIMARY SERVICE: Neuro  CHIEF COMPLAINT:VDRF post stroke and intervention  BRIEF PATIENT DESCRIPTION:  68 yo female with hx of ESRD and being evaluated for transplant at [facility], also had recent CABG for CAD.  She presented with slurred speech and decreased mental status from CVA.  She was intubated for airway protection, and PCCM consulted to assist with vent management.  SIGNIFICANT EVENTS: 5/28 Admit, neuro IR  STUDIES:  Echo 5/28 >> EF 55 to 60% MRI brain 5/30 >> multi focal acute ischemic infarcts in Lt frontal and parietal lobes, SDH along falx  LINES / TUBES: 5/28 ETT >> 5/28 Lt fem sheath aline>>5/28 5/28 Rt fem v sheath>>>5/28  SUBJECTIVE:   Low Vt, increase RR with PS 5/5.  VITAL SIGNS: Temp:  [97.5 F (36.4 C)-99.6 F (37.6 C)] 99.6 F (37.6 C) (05/30 0812) Pulse Rate:  [66-95] 83 (05/30 0700) Resp:  [0-28] 16 (05/30 0700) BP: (95-167)/(49-111) 120/49 mmHg (05/30 0700) SpO2:  [91 %-100 %] 99 % (05/30 0700) FiO2 (%):  [40 %] 40 % (05/30 0700) Weight:  [166 lb 7.2 oz (75.5 kg)-174 lb 2.6 oz (79 kg)] 166 lb 7.2 oz (75.5 kg) (05/30 0440) VENTILATOR SETTINGS: Vent Mode:  [-] PRVC FiO2 (%):  [40 %] 40 % Set Rate:  [16 bmp] 16 bmp Vt Set:  [450 mL] 450 mL PEEP:  [5 cmH20] 5 cmH20 Plateau Pressure:  [22 cmH20-27 cmH20] 24 cmH20 INTAKE / OUTPUT: Intake/Output     05/29 0701 - 05/30 0700 05/30 0701 - 05/31 0700   I.V. (mL/kg) 872 (11.5)    NG/GT 479.7    Total Intake(mL/kg) 1351.7 (17.9)    Urine (mL/kg/hr)     Other 3151 (1.7)    Total Output 3151     Net -1799.3          Stool Occurrence 1 x      PHYSICAL EXAMINATION: General: mild increased WOB Neuro: RASS 0 HEENT: ETT in place Cardiovascular: regular Lungs: decreased BS LT > RT Abdomen:  Peritoneal cath in place and site  unremarkable, BS wnl Musculoskeletal: no edema Skin:  Rt bicep av graft + bruit/trill    LABS:  CBC  Recent Labs Lab 08/19/13 0003 08/19/13 0020 08/19/13 0554 08/20/13 0330  WBC 5.9  --  4.9 6.3  HGB 10.3* 12.6 8.6* 8.6*  HCT 35.1* 37.0 28.1* 27.8*  PLT 257  --  212 214   Coag's  Recent Labs Lab 08/19/13 0003 08/20/13 0330  APTT 28 29  INR 1.09 1.21   BMET  Recent Labs Lab 08/19/13 0554 08/20/13 0330 08/21/13 0453  NA 142 141 143  K 4.0 5.0 3.5*  CL 101 102 100  CO2 24 22 25   BUN 20 24* 9  CREATININE 5.17* 5.93* 2.72*  GLUCOSE 244* 109* 130*   Electrolytes  Recent Labs Lab 08/19/13 0554 08/20/13 0330 08/21/13 0453  CALCIUM 8.3* 8.2* 7.9*  MG  --  1.9  --   PHOS  --  5.8*  --    Sepsis Markers  Recent Labs Lab 08/19/13 0013  LATICACIDVEN 1.27   ABG  Recent Labs Lab 08/19/13 0910 08/20/13 0920  PHART 7.388 7.245*  PCO2ART 43.9 55.1*  PO2ART 73.0* 75.7*   Liver Enzymes  Recent Labs Lab 08/19/13 0003 08/20/13 0330  AST 18 38*  ALT  15 10  ALKPHOS 76 56  BILITOT 0.3 0.3  ALBUMIN 2.8* 2.1*   Cardiac Enzymes  Recent Labs Lab 08/19/13 0554 08/19/13 1339  TROPONINI <0.30 <0.30   Glucose  Recent Labs Lab 08/20/13 1139 08/20/13 1601 08/20/13 1937 08/21/13 0002 08/21/13 0351 08/21/13 0810  GLUCAP 131* 141* 157* 105* 117* 184*    Imaging Ct Head Wo Contrast  08/20/2013   CLINICAL DATA:  Stroke.  24 hr status post tPA.  EXAM: CT HEAD WITHOUT CONTRAST  TECHNIQUE: Contiguous axial images were obtained from the base of the skull through the vertex without intravenous contrast.  COMPARISON:  Prior head CT from 08/19/2013.  FINDINGS: There is a persistent 1.2 cm hyperdensity seen within the left parafalcine gray matter of the left parietal lobe (series 2, image 21). This hyperdensity is grossly stable relative to most recent CT, and may represent a small amount of hemorrhage. Additionally, increased hyperdensity along the falx is  also similar. Hyperdensity along the left tentorium has largely resolved. Is unclear whether these opacities represent small amount of blood products or are related to recent catheter directed angiogram.  No definite acute large vessel territory infarct identified. Focal hypodensity overlying the cortical gray matter of the left frontal lobe on image 18 is favored to reflect volume averaging with the adjacent cortical sulcus. No mass lesion or midline shift. No hydrocephalus. Atrophy with chronic microvascular ischemic changes again noted.  Calvarium is intact. Orbits are unremarkable. Paranasal sinuses and mastoid air cells are clear.  IMPRESSION: 1. No acute large vessel territory infarct identified. 2. Stable 1.2 cm hyperdensity within the left parafalcine gray matter within the left parietal lobe. Finding is stable as compared to most recent CT, but new relative to initial CT performed prior to catheter directed arteriogram and tPA administration. It is unclear whether this opacity represents a small amount of blood products or is related to recent procedure. Continued followup recommended. 3. Increased hyperdensity along the falx as compared to initial CT from 08/19/2013, with decreased hyperdensity along the left tentorium. Again, it is unclear whether this represents a small amount of subdural hemorrhage versus sequelae of recent intervention. Continued attention on followup recommended.   Electronically Signed   By: Jeannine Boga M.D.   On: 08/20/2013 02:49   Mr Brain Wo Contrast  08/21/2013   CLINICAL DATA:  Stroke, status post intervention with catheter directed tPA administration.  EXAM: MRI HEAD WITHOUT CONTRAST  TECHNIQUE: Multiplanar, multiecho pulse sequences of the brain and surrounding structures were obtained without intravenous contrast.  COMPARISON:  Prior CTs from earlier the same day as well as 08/19/2013  FINDINGS: The CSF containing spaces are within normal limits for patient age.  Mild scattered T2/FLAIR hyperintensity within the periventricular and deep white matter both cerebral hemispheres is compatible with mild to moderate chronic small vessel ischemic changes. No mass lesion, midline shift, or extra-axial fluid collection. Ventricles are normal in size without evidence of hydrocephalus.  Multiple scattered foci of restricted diffusion are seen involving the cortical gray matter of the left frontal and parietal lobes in the left MCA distribution. These infarcts are seen on series 4, images 18 through 22. There is involvement of the left insular cortex is well (series 4, image 13). No evidence of associated hemorrhage or significant mass effect. There is associated hyperintense FLAIR signal seen within these infarcts.  Previously seen 1.2 cm hyperdensity within the left parafalcine gray matter of the left parietal lobe is not visualized on this examination, which may  have represented contrast staining related to prior procedure. A small amount of acute subarachnoid hemorrhage which has since resorbed is also a possibility. A thin subdural hemorrhage seen along the falx without significant mass effect.  The cervicomedullary junction is normal. Pituitary gland is within normal limits. Pituitary stalk is midline. The globes and optic nerves demonstrate a normal appearance with normal signal intensity.  The bone marrow signal intensity is normal. Calvarium is intact. Visualized upper cervical spine is within normal limits.  Scalp soft tissues are unremarkable.  Paranasal sinuses are clear.  No mastoid effusion.  IMPRESSION: 1. Multi focal acute ischemic infarcts involving the left frontal and parietal lobes in the left MCA distribution as above. No associated hemorrhage or significant mass effect. 2. Thin subdural hemorrhage along the falx without significant mass effect. This is decreased in size relative to prior CT from 08/19/2013. 3. Mild to moderate chronic microvascular ischemic disease  involving the supratentorial white matter.   Electronically Signed   By: Jeannine Boga M.D.   On: 08/21/2013 01:26   Dg Chest Port 1 View  08/20/2013   CLINICAL DATA:  Intubated patient.  Pleural effusions.  EXAM: PORTABLE CHEST - 1 VIEW  COMPARISON:  08/19/2013  FINDINGS: Endotracheal tube and nasogastric tube are stable and well positioned.  Changes from cardiac surgery are stable.  No mediastinal widening.  Hazy perihilar and lung base opacity as well as interstitial thickening is noted, improved in the left mid lung from the prior study, but otherwise stable, consistent with a combination of pulmonary edema and small bilateral effusions.  No pneumothorax.  IMPRESSION: Mildly improved pulmonary edema most evident in the left mid and lower lung. Persistent small effusions. No new abnormalities. Support apparatus remains well-positioned.   Electronically Signed   By: Lajean Manes M.D.   On: 08/20/2013 08:02   Dg Chest Port 1 View  08/19/2013   CLINICAL DATA:  Cerebral infarction and respiratory failure.  EXAM: PORTABLE CHEST - 1 VIEW  COMPARISON:  08/19/2013  FINDINGS: The patient has been intubated with the endotracheal tube tip approximately 4.5 cm above the carina. Gastric decompression tube enters the proximal stomach. Lungs show stable diffuse interstitial edema/CHF with probable small bilateral pleural effusions and stable dense atelectasis at the left base.  IMPRESSION: Endotracheal tube placement with tip approximately 4.5 cm above the carina. Stable diffuse interstitial edema, left lower lobe consolidation and probable small bilateral pleural effusions.   Electronically Signed   By: Aletta Edouard M.D.   On: 08/19/2013 09:52   Dg Abd Portable 1v  08/20/2013   CLINICAL DATA:  Orogastric tube placement.  EXAM: PORTABLE ABDOMEN - 1 VIEW  COMPARISON:  Abdominal series January 20, 2007  FINDINGS: Nasogastric tube tip projects in proximal stomach, side port immediately below the  gastroesophageal junction. Bowel gas pattern is nondilated and nonobstructive. Calcifications project in the region of the kidneys bilaterally, and could be vascular. Peritoneal catheter in place. Surgical clips project in the pelvis and lower abdomen, status post L5-S1 PLIF.  IMPRESSION: Nasogastric tube tip projects in proximal stomach.  Nonspecific bowel gas pattern.  Peritoneal catheter in place, with calcifications projecting in the region of the kidneys bilaterally.   Electronically Signed   By: Elon Alas   On: 08/20/2013 12:45    ASSESSMENT / PLAN:  PULMONARY A:  Acute respiratory failure in setting of CVA. P:   Pressure support wean as tolerated >> not ready for extubation yet F/u CXR  CARDIOVASCULAR A:  Hx of  CAD s/p recent CABG, HTN. P:  Cardene gtt per stroke team Coreg, zocor per primary team Resume ASA when okay with stroke team  RENAL A:   ESRD on HD >> has existing peritoneal catheter in place also; awaiting transplant. P:   HD per renal >> remove fluid as tolerated  GASTROINTESTINAL A:   Hx of GIB. P:   Tube feeds while on vent BID protonix  HEMATOLOGIC A:   Anemia of chronic disease. P:  F/u CBC SCD's  INFECTIOUS A:  No acute issue. P:   Monitor clinically  ENDOCRINE A:  DM type II. P:   SSI  NEUROLOGIC A:   Lt MCA CVA. P:   RASS goal: 0  Rest per neuro  CC time 35 minutes.  Updated family at bedside.  Chesley Mires, MD [facility] 08/21/2013, 8:51 AM Pager:  [phone] After 3pm call: [phone]

## 2013-08-21 NOTE — Progress Notes (Signed)
Subjective: Interval History: has no complaint, on vent had HD last pm.  Objective: Vital signs in last 24 hours: Temp:  [97.5 F (36.4 C)-99.6 F (37.6 C)] 99.6 F (37.6 C) (05/30 0812) Pulse Rate:  [71-89] 87 (05/30 0848) Resp:  [0-24] 24 (05/30 0848) BP: (95-153)/(49-111) 122/51 mmHg (05/30 0848) SpO2:  [98 %-100 %] 98 % (05/30 0848) FiO2 (%):  [40 %] 40 % (05/30 0848) Weight:  [75.5 kg (166 lb 7.2 oz)-79 kg (174 lb 2.6 oz)] 75.5 kg (166 lb 7.2 oz) (05/30 0440) Weight change:   Intake/Output from previous day: 05/29 0701 - 05/30 0700 In: 1351.7 [I.V.:872; NG/GT:479.7] Out: 3151  Intake/Output this shift: Total I/O In: 60 [I.V.:20; NG/GT:40] Out: -   General appearance: alert, cooperative and pale Resp: rhonchi bilaterally Cardio: S1, S2 normal and systolic murmur: holosystolic 2/6, blowing at apex GI: soft,pos bs, liver down 5 cm, PD cath L abdm Extremities: AVF RUA B&T  Lab Results:  Recent Labs  08/19/13 0554 08/20/13 0330  WBC 4.9 6.3  HGB 8.6* 8.6*  HCT 28.1* 27.8*  PLT 212 214   BMET:  Recent Labs  08/20/13 0330 08/21/13 0453  NA 141 143  K 5.0 3.5*  CL 102 100  CO2 22 25  GLUCOSE 109* 130*  BUN 24* 9  CREATININE 5.93* 2.72*  CALCIUM 8.2* 7.9*   No results found for this basename: PTH,  in the last 72 hours Iron Studies:  Recent Labs  08/20/13 1245  IRON 21*  TIBC 132*    Studies/Results: Ct Head Wo Contrast  08/20/2013   CLINICAL DATA:  Stroke.  24 hr status post tPA.  EXAM: CT HEAD WITHOUT CONTRAST  TECHNIQUE: Contiguous axial images were obtained from the base of the skull through the vertex without intravenous contrast.  COMPARISON:  Prior head CT from 08/19/2013.  FINDINGS: There is a persistent 1.2 cm hyperdensity seen within the left parafalcine gray matter of the left parietal lobe (series 2, image 21). This hyperdensity is grossly stable relative to most recent CT, and may represent a small amount of hemorrhage. Additionally,  increased hyperdensity along the falx is also similar. Hyperdensity along the left tentorium has largely resolved. Is unclear whether these opacities represent small amount of blood products or are related to recent catheter directed angiogram.  No definite acute large vessel territory infarct identified. Focal hypodensity overlying the cortical gray matter of the left frontal lobe on image 18 is favored to reflect volume averaging with the adjacent cortical sulcus. No mass lesion or midline shift. No hydrocephalus. Atrophy with chronic microvascular ischemic changes again noted.  Calvarium is intact. Orbits are unremarkable. Paranasal sinuses and mastoid air cells are clear.  IMPRESSION: 1. No acute large vessel territory infarct identified. 2. Stable 1.2 cm hyperdensity within the left parafalcine gray matter within the left parietal lobe. Finding is stable as compared to most recent CT, but new relative to initial CT performed prior to catheter directed arteriogram and tPA administration. It is unclear whether this opacity represents a small amount of blood products or is related to recent procedure. Continued followup recommended. 3. Increased hyperdensity along the falx as compared to initial CT from 08/19/2013, with decreased hyperdensity along the left tentorium. Again, it is unclear whether this represents a small amount of subdural hemorrhage versus sequelae of recent intervention. Continued attention on followup recommended.   Electronically Signed   By: Jeannine Boga M.D.   On: 08/20/2013 02:49   Mr Brain Wo Contrast  08/21/2013   CLINICAL DATA:  Stroke, status post intervention with catheter directed tPA administration.  EXAM: MRI HEAD WITHOUT CONTRAST  TECHNIQUE: Multiplanar, multiecho pulse sequences of the brain and surrounding structures were obtained without intravenous contrast.  COMPARISON:  Prior CTs from earlier the same day as well as 08/19/2013  FINDINGS: The CSF containing spaces  are within normal limits for patient age. Mild scattered T2/FLAIR hyperintensity within the periventricular and deep white matter both cerebral hemispheres is compatible with mild to moderate chronic small vessel ischemic changes. No mass lesion, midline shift, or extra-axial fluid collection. Ventricles are normal in size without evidence of hydrocephalus.  Multiple scattered foci of restricted diffusion are seen involving the cortical gray matter of the left frontal and parietal lobes in the left MCA distribution. These infarcts are seen on series 4, images 18 through 22. There is involvement of the left insular cortex is well (series 4, image 13). No evidence of associated hemorrhage or significant mass effect. There is associated hyperintense FLAIR signal seen within these infarcts.  Previously seen 1.2 cm hyperdensity within the left parafalcine gray matter of the left parietal lobe is not visualized on this examination, which may have represented contrast staining related to prior procedure. A small amount of acute subarachnoid hemorrhage which has since resorbed is also a possibility. A thin subdural hemorrhage seen along the falx without significant mass effect.  The cervicomedullary junction is normal. Pituitary gland is within normal limits. Pituitary stalk is midline. The globes and optic nerves demonstrate a normal appearance with normal signal intensity.  The bone marrow signal intensity is normal. Calvarium is intact. Visualized upper cervical spine is within normal limits.  Scalp soft tissues are unremarkable.  Paranasal sinuses are clear.  No mastoid effusion.  IMPRESSION: 1. Multi focal acute ischemic infarcts involving the left frontal and parietal lobes in the left MCA distribution as above. No associated hemorrhage or significant mass effect. 2. Thin subdural hemorrhage along the falx without significant mass effect. This is decreased in size relative to prior CT from 08/19/2013. 3. Mild to  moderate chronic microvascular ischemic disease involving the supratentorial white matter.   Electronically Signed   By: Jeannine Boga M.D.   On: 08/21/2013 01:26   Dg Chest Port 1 View  08/20/2013   CLINICAL DATA:  Intubated patient.  Pleural effusions.  EXAM: PORTABLE CHEST - 1 VIEW  COMPARISON:  08/19/2013  FINDINGS: Endotracheal tube and nasogastric tube are stable and well positioned.  Changes from cardiac surgery are stable.  No mediastinal widening.  Hazy perihilar and lung base opacity as well as interstitial thickening is noted, improved in the left mid lung from the prior study, but otherwise stable, consistent with a combination of pulmonary edema and small bilateral effusions.  No pneumothorax.  IMPRESSION: Mildly improved pulmonary edema most evident in the left mid and lower lung. Persistent small effusions. No new abnormalities. Support apparatus remains well-positioned.   Electronically Signed   By: Lajean Manes M.D.   On: 08/20/2013 08:02   Dg Abd Portable 1v  08/20/2013   CLINICAL DATA:  Orogastric tube placement.  EXAM: PORTABLE ABDOMEN - 1 VIEW  COMPARISON:  Abdominal series January 20, 2007  FINDINGS: Nasogastric tube tip projects in proximal stomach, side port immediately below the gastroesophageal junction. Bowel gas pattern is nondilated and nonobstructive. Calcifications project in the region of the kidneys bilaterally, and could be vascular. Peritoneal catheter in place. Surgical clips project in the pelvis and lower abdomen, status post  L5-S1 PLIF.  IMPRESSION: Nasogastric tube tip projects in proximal stomach.  Nonspecific bowel gas pattern.  Peritoneal catheter in place, with calcifications projecting in the region of the kidneys bilaterally.   Electronically Signed   By: Elon Alas   On: 08/20/2013 12:45    I have reviewed the patient's current medications.  Assessment/Plan: 1 CRF for HD to get on sched.  Vol xs yet 2 Anemia hb stable but needs Fe iv 3 CVA  per neuro 4 CAD 5 ^ lipids 6 DM controlled 7 HPTH 8 VDRF on HD P HD, epo ,fe , wean,     LOS: 3 days   Isaac Lacson L Latifah Padin 08/21/2013,10:38 AM

## 2013-08-21 NOTE — Progress Notes (Signed)
Stroke Team Progress Note  HISTORY [name] is a 68 y.o. female who was last seen normal around 10:30 at night 08/18/2013 when she had some slurred speech. She subsequently resolved, but then as they're getting into bed again she had garbled speech and he decided to bring her into the emergency room. She was brought into the ER, and in the ER she was noticed to get significantly worse and became unresponsive for a brief period of time. She became globally aphasic and there is concern briefly that she was not protecting her airway, but her mental status once again improved to the point where she was not intubated.   She had open heart surgery done in early March, she had some GI bleeding done in early April which was cauterized. Husband reports no issues with bleeding since that time and her hemoglobin here is significantly higher than at discharge. She undergoes hemodialysis, but does have a peritoneal dialysis catheter in place. The need for heart surgery in March was apparently found on evaluation for kidney transplant surgery and this surgery was undertaken in anticipation of kidney transplantation.   CTA demonstrated L M2 occlusion. Patient was administered TPA. She was taken to neuro intervention where she had near complete revascularization of L MCA M2-M3 branches of the superior division of L MCA with IA tPA.  She was admitted to the neuro ICU for further evaluation and treatment.  SUBJECTIVE Patient not extubated y`day due to acidosis and anxeity. Had dialysis y`day and will plan extubation tomorrow. Neurologically no changes.  OBJECTIVE Most recent Vital Signs: Filed Vitals:   08/21/13 0812 08/21/13 0848 08/21/13 1000 08/21/13 1100  BP:  122/51 140/60 141/60  Pulse:  87    Temp: 99.6 F (37.6 C)     TempSrc: Axillary     Resp:  24    Height:      Weight:      SpO2:  98%     CBG (last 3)   Recent Labs  08/21/13 0002 08/21/13 0351 08/21/13 0810  GLUCAP 105* 117* 184*     IV Fluid Intake:   . sodium chloride 10 mL/hr at 08/20/13 0600  . feeding supplement (NEPRO CARB STEADY) 1,000 mL (08/20/13 1301)  . niCARDipine Stopped (08/21/13 0054)    MEDICATIONS  . ALPRAZolam  0.5 mg Per Tube QHS  . antiseptic oral rinse  15 mL Mouth Rinse QID  . chlorhexidine  15 mL Mouth Rinse BID  . darbepoetin (ARANESP) injection - DIALYSIS  150 mcg Intravenous Q Sat-HD  . feeding supplement (PRO-STAT SUGAR FREE 64)  30 mL Per Tube 5 X Daily  . ferric gluconate (FERRLECIT/NULECIT) IV  125 mg Intravenous Q T,Th,Sa-HD  . insulin aspart  0-9 Units Subcutaneous 6 times per day  . pantoprazole sodium  40 mg Per Tube BID   PRN:  sodium chloride, sodium chloride, acetaminophen (TYLENOL) oral liquid 160 mg/5 mL, feeding supplement (NEPRO CARB STEADY), fentaNYL, fentaNYL, heparin, heparin, labetalol, lidocaine (PF), lidocaine-prilocaine, midazolam, ondansetron (ZOFRAN) IV, pentafluoroprop-tetrafluoroeth  Diet:  NPO  Activity:  Bedrest DVT Prophylaxis:  SCDs   CLINICALLY SIGNIFICANT STUDIES Basic Metabolic Panel:   Recent Labs Lab 08/20/13 0330 08/21/13 0453  NA 141 143  K 5.0 3.5*  CL 102 100  CO2 22 25  GLUCOSE 109* 130*  BUN 24* 9  CREATININE 5.93* 2.72*  CALCIUM 8.2* 7.9*  MG 1.9  --   PHOS 5.8*  --    Liver Function Tests:   Recent Labs  Lab 08/19/13 0003 08/20/13 0330  AST 18 38*  ALT 15 10  ALKPHOS 76 56  BILITOT 0.3 0.3  PROT 7.5 6.1  ALBUMIN 2.8* 2.1*   CBC:   Recent Labs Lab 08/19/13 0554 08/20/13 0330  WBC 4.9 6.3  NEUTROABS 4.1 5.0  HGB 8.6* 8.6*  HCT 28.1* 27.8*  MCV 102.9* 101.5*  PLT 212 214   Coagulation:   Recent Labs Lab 08/19/13 0003 08/20/13 0330  LABPROT 13.9 15.0  INR 1.09 1.21   Cardiac Enzymes:   Recent Labs Lab 08/19/13 0554 08/19/13 1339  TROPONINI <0.30 <0.30   Urinalysis: No results found for this basename: COLORURINE, APPERANCEUR, LABSPEC, PHURINE, GLUCOSEU, HGBUR, BILIRUBINUR, KETONESUR, PROTEINUR,  UROBILINOGEN, NITRITE, LEUKOCYTESUR,  in the last 168 hours Lipid Panel    Component Value Date/Time   CHOL 109 08/19/2013 0554   TRIG 79 08/19/2013 0554   HDL 51 08/19/2013 0554   CHOLHDL 2.1 08/19/2013 0554   VLDL 16 08/19/2013 0554   LDLCALC 42 08/19/2013 0554   HgbA1C  Lab Results  Component Value Date   HGBA1C 6.7* 08/19/2013    Urine Drug Screen:   No results found for this basename: labopia,  cocainscrnur,  labbenz,  amphetmu,  thcu,  labbarb    Alcohol Level: No results found for this basename: ETH,  in the last 168 hours    CT of the brain   08/20/2013 1. No acute large vessel territory infarct identified. 2. Stable 1.2 cm hyperdensity within the left parafalcine gray matter within the left parietal lobe. Finding is stable as compared to most recent CT, but new relative to initial CT performed prior to catheter directed arteriogram and tPA administration. It is unclear whether this opacity represents a small amount of blood products or is related to recent procedure. Continued followup recommended. 3. Increased hyperdensity along the falx as compared to initial CT from 08/19/2013, with decreased hyperdensity along the left tentorium. Again, it is unclear whether this represents a small  amount of subdural hemorrhage versus sequelae of recent intervention. 08/19/2013    1. New 1.2 cm hyperdensity within the left parafalcine gray matter of the left parietal lobe. Finding is concerning for possible new subarachnoid/intraparenchymal hemorrhage. 2. Increased hyperdensity along the falx and left tentorium. While this finding may be related to recent procedure, possible acute subdural hemorrhage could also have this appearance. A close interval followup CT would likely be helpful to further evaluate these findings.   08/19/2013    1. No acute intracranial pathology seen on CT. 2. Scattered small vessel ischemic microangiopathy.   CT Angio Head  08/19/2013    1. Occlusion of a left M2 branch  supplying the anterior division of the left MCA artery territory as above. 2. No other proximal branch occlusion or high-grade flow-limiting stenosis identified within the anterior circulation.   CT Angio Neck 08/19/2013   1. Atherosclerotic plaque at the mid origin of the right internal carotid artery with associated short segment stenosis of approximately 50% by NASCET criteria. 2. Atherosclerotic plaque at the origin of the left internal carotid artery with associated stenosis of approximately 30-40% by NASCET criteria. 3. No high-grade stenosis or vascular occlusion identified within the neck. 4. Moderate bilateral pleural effusions, right greater than left. 5. Patchy and ground-glass opacity within the partially visualized upper lobes, right greater than left. Findings are indeterminate, and may represent edema and/or infection. Clinical correlation with dedicated chest radiograph recommended. 6. Left supraclavicular adenopathy as above, of uncertain etiology, and may  be reactive in nature.   Cerebral Angio 08/19/2013 S/P lt common carotid arteriogram followed by near complete revascularization of lt MCA M2-M 3branches of superior division of LT MCA with 15 mg of superselective IA TPA   MRI of the brain   1. Multi focal acute ischemic infarcts involving the left frontal and parietal lobes in the left MCA distribution as above. No associated hemorrhage or significant mass effect. 2. Thin subdural hemorrhage along the falx without significant mass effect. This is decreased in size relative to prior CT from 08/19/2013. 3. Mild to moderate chronic microvascular ischemic disease involving the supratentorial white matter.  2D Echocardiogram  - EF 55 - 60%. No cardiac source of emboli identified.  CXR  08/20/2013 Mildly improved pulmonary edema most evident in the left mid and lower lung. Persistent small effusions. No new abnormalities. Support apparatus remains well-positioned. 08/19/2013   CHF with  bibasilar effusions and atelectasis, unable to exclude consolidation in LEFT lower lobe.  EKG  atrial fibrillation, rate 76. For complete results please see formal report.   Therapy Recommendations - Pending  Physical Exam   Middle aged 8 lady intubated. .Awake alert. Afebrile. Head is nontraumatic. Neck is supple without bruit. Hearing is normal. Cardiac exam no murmur or gallop. Lungs are clear to auscultation. Distal pulses are well felt. Neurological Exam : Awake alert and interactive. Follows simple midline and one-step commands well. Pupils equal reactive. Vision acuity and feels inadequate. Extraocular moments are full range without nystagmus. Face is symmetric. Tongue is midline. Able to move both upper extremities well against gravity left and greater than right. Mild right grip and intrinsic hand muscle weakness. Diminished fine finger movements on the right. Orbits left over right upper extremity. no focal LE weakness.Plantars downgoing bilaterally.  ASSESSMENT [name] is a 68 y.o. female presenting with slurred speech that progressed to global aphasia with momentary loss of consciousness and right hemirparesis. Status post IV t-PA 08/19/2013 at 0052 followed by near complete revascularization of L MCA M2-M3 branches of the superior division of L MCA with IA tPA. Suspect a small left MCA infarct. Repeat CT does show left parafalcine post tPA asymptomatic hemorrhage.  Infarct likely embolic secondary to atrial fibrillation see on EKG.  On no antithrombotics prior to admission. Now on no antithrombotics given asymptomatic hemorrhage for secondary stroke prevention. Patient with resultant VDRF, mild right hand hemiparesis. Stroke work up underway.  Acute respiratory failure, CCM following  Remains intubated this am. Failed weaning x 2 yesterday thought to be related to anxiety.  Effusion  COPD. Uses 02  atrial fibrillation per admitting EKG, new finding. Old records  indicate atrial arrythmia. Has not been on anticoagulation Hypertension. On cardene post intervention, now off.  Hyperlipidemia, LDL 42, on zocor 40 PTA, now on no statin as NPO, goal LDL < 70 for diabetics Diabetes, HgbA1c 5.6, goal < 7.0 CAD - MI Hx stroke 2010, no residual deficits  ESRD, on hemodialysis Tues, Thurs and Sat, [facility]. Renal following. Has peritoneal cath x 1 yr.  Anemia post tPA 12->8.6 Hx recent GIB requiring cautery Hx ovarian cancer, 1995, [place], in remission Known post CABG L leg wound with dressing. Weekly dressing  Hospital day # 3  TREATMENT/PLAN  Plan to add aspirin for secondary stroke prevention once post tPA hemorrhage becomes isodense.Plan to check Ct head wo in am and if blood is isodense will start aspirin. Consider anticoagulation given atrial fibrillation at time of discharge.   SBP goal <  160  Add lovenox for VTE prophylaxis   Stroke workup : MRI, 2D - see above results  Xanax added will change to scheduled bid dose  CCM to manage medical issues, renal consult  SIGNED Mikey Bussing PA-C [facility] Pager [phone] 08/21/2013, 11:51 AM  This patient is critically ill and at significant risk of neurological worsening, death and care requires constant monitoring of vital signs, hemodynamics,respiratory and cardiac monitoring,review of multiple databases, neurological assessment, discussion with family, other specialists and medical decision making of high complexity. I spent 30 minutes of neurocritical care time  in the care of  this patient.  I have personally obtained a history, examined the patient, evaluated imaging results, and formulated the assessment and plan of care. I agree with the above. Antony Contras, MD  To contact Stroke Continuity provider, please refer to [email or web address] After hours, contact General Neurology

## 2013-08-21 NOTE — Progress Notes (Signed)
Dialysis started by 2300 and finished by 0300. Patient tolerated fairly well. Pulled out 3151 ml of fluid per dialysis nurse.

## 2013-08-21 NOTE — Progress Notes (Signed)
2 Days Post-Op  Subjective: CVA 5/28 L MCA revascularization with IA tpa 5/28 with Dr Estanislado Pandy Pt alert Still intubated  Objective: Vital signs in last 24 hours: Temp:  [97.5 F (36.4 C)-99.6 F (37.6 C)] 99.6 F (37.6 C) (05/30 0812) Pulse Rate:  [71-89] 87 (05/30 0848) Resp:  [0-24] 24 (05/30 0848) BP: (95-153)/(49-111) 122/51 mmHg (05/30 0848) SpO2:  [98 %-100 %] 98 % (05/30 0848) FiO2 (%):  [40 %] 40 % (05/30 0848) Weight:  [75.5 kg (166 lb 7.2 oz)-79 kg (174 lb 2.6 oz)] 75.5 kg (166 lb 7.2 oz) (05/30 0440) Last BM Date: 08/21/13  Intake/Output from previous day: 05/29 0701 - 05/30 0700 In: 1351.7 [I.V.:872; NG/GT:479.7] Out: 3151  Intake/Output this shift: Total I/O In: 60 [I.V.:20; NG/GT:40] Out: -   PE:  Alert; EOM Follows all commands appropriately Intubated B strength = Moves all 4s= Groin no bleeding no hematoma B feet 2+ pulses Labs good Bun/Cr better daily   Lab Results:   Recent Labs  08/19/13 0554 08/20/13 0330  WBC 4.9 6.3  HGB 8.6* 8.6*  HCT 28.1* 27.8*  PLT 212 214   BMET  Recent Labs  08/20/13 0330 08/21/13 0453  NA 141 143  K 5.0 3.5*  CL 102 100  CO2 22 25  GLUCOSE 109* 130*  BUN 24* 9  CREATININE 5.93* 2.72*  CALCIUM 8.2* 7.9*   PT/INR  Recent Labs  08/19/13 0003 08/20/13 0330  LABPROT 13.9 15.0  INR 1.09 1.21   ABG  Recent Labs  08/19/13 0910 08/20/13 0920  PHART 7.388 7.245*  HCO3 26.6* 23.1    Studies/Results: Ct Head Wo Contrast  08/20/2013   CLINICAL DATA:  Stroke.  24 hr status post tPA.  EXAM: CT HEAD WITHOUT CONTRAST  TECHNIQUE: Contiguous axial images were obtained from the base of the skull through the vertex without intravenous contrast.  COMPARISON:  Prior head CT from 08/19/2013.  FINDINGS: There is a persistent 1.2 cm hyperdensity seen within the left parafalcine gray matter of the left parietal lobe (series 2, image 21). This hyperdensity is grossly stable relative to most recent CT, and may  represent a small amount of hemorrhage. Additionally, increased hyperdensity along the falx is also similar. Hyperdensity along the left tentorium has largely resolved. Is unclear whether these opacities represent small amount of blood products or are related to recent catheter directed angiogram.  No definite acute large vessel territory infarct identified. Focal hypodensity overlying the cortical gray matter of the left frontal lobe on image 18 is favored to reflect volume averaging with the adjacent cortical sulcus. No mass lesion or midline shift. No hydrocephalus. Atrophy with chronic microvascular ischemic changes again noted.  Calvarium is intact. Orbits are unremarkable. Paranasal sinuses and mastoid air cells are clear.  IMPRESSION: 1. No acute large vessel territory infarct identified. 2. Stable 1.2 cm hyperdensity within the left parafalcine gray matter within the left parietal lobe. Finding is stable as compared to most recent CT, but new relative to initial CT performed prior to catheter directed arteriogram and tPA administration. It is unclear whether this opacity represents a small amount of blood products or is related to recent procedure. Continued followup recommended. 3. Increased hyperdensity along the falx as compared to initial CT from 08/19/2013, with decreased hyperdensity along the left tentorium. Again, it is unclear whether this represents a small amount of subdural hemorrhage versus sequelae of recent intervention. Continued attention on followup recommended.   Electronically Signed   By: Marland Kitchen  Jeannine Boga M.D.   On: 08/20/2013 02:49   Mr Brain Wo Contrast  08/21/2013   CLINICAL DATA:  Stroke, status post intervention with catheter directed tPA administration.  EXAM: MRI HEAD WITHOUT CONTRAST  TECHNIQUE: Multiplanar, multiecho pulse sequences of the brain and surrounding structures were obtained without intravenous contrast.  COMPARISON:  Prior CTs from earlier the same day as well  as 08/19/2013  FINDINGS: The CSF containing spaces are within normal limits for patient age. Mild scattered T2/FLAIR hyperintensity within the periventricular and deep white matter both cerebral hemispheres is compatible with mild to moderate chronic small vessel ischemic changes. No mass lesion, midline shift, or extra-axial fluid collection. Ventricles are normal in size without evidence of hydrocephalus.  Multiple scattered foci of restricted diffusion are seen involving the cortical gray matter of the left frontal and parietal lobes in the left MCA distribution. These infarcts are seen on series 4, images 18 through 22. There is involvement of the left insular cortex is well (series 4, image 13). No evidence of associated hemorrhage or significant mass effect. There is associated hyperintense FLAIR signal seen within these infarcts.  Previously seen 1.2 cm hyperdensity within the left parafalcine gray matter of the left parietal lobe is not visualized on this examination, which may have represented contrast staining related to prior procedure. A small amount of acute subarachnoid hemorrhage which has since resorbed is also a possibility. A thin subdural hemorrhage seen along the falx without significant mass effect.  The cervicomedullary junction is normal. Pituitary gland is within normal limits. Pituitary stalk is midline. The globes and optic nerves demonstrate a normal appearance with normal signal intensity.  The bone marrow signal intensity is normal. Calvarium is intact. Visualized upper cervical spine is within normal limits.  Scalp soft tissues are unremarkable.  Paranasal sinuses are clear.  No mastoid effusion.  IMPRESSION: 1. Multi focal acute ischemic infarcts involving the left frontal and parietal lobes in the left MCA distribution as above. No associated hemorrhage or significant mass effect. 2. Thin subdural hemorrhage along the falx without significant mass effect. This is decreased in size  relative to prior CT from 08/19/2013. 3. Mild to moderate chronic microvascular ischemic disease involving the supratentorial white matter.   Electronically Signed   By: Jeannine Boga M.D.   On: 08/21/2013 01:26   Dg Chest Port 1 View  08/20/2013   CLINICAL DATA:  Intubated patient.  Pleural effusions.  EXAM: PORTABLE CHEST - 1 VIEW  COMPARISON:  08/19/2013  FINDINGS: Endotracheal tube and nasogastric tube are stable and well positioned.  Changes from cardiac surgery are stable.  No mediastinal widening.  Hazy perihilar and lung base opacity as well as interstitial thickening is noted, improved in the left mid lung from the prior study, but otherwise stable, consistent with a combination of pulmonary edema and small bilateral effusions.  No pneumothorax.  IMPRESSION: Mildly improved pulmonary edema most evident in the left mid and lower lung. Persistent small effusions. No new abnormalities. Support apparatus remains well-positioned.   Electronically Signed   By: Lajean Manes M.D.   On: 08/20/2013 08:02   Dg Abd Portable 1v  08/20/2013   CLINICAL DATA:  Orogastric tube placement.  EXAM: PORTABLE ABDOMEN - 1 VIEW  COMPARISON:  Abdominal series January 20, 2007  FINDINGS: Nasogastric tube tip projects in proximal stomach, side port immediately below the gastroesophageal junction. Bowel gas pattern is nondilated and nonobstructive. Calcifications project in the region of the kidneys bilaterally, and could be vascular. Peritoneal  catheter in place. Surgical clips project in the pelvis and lower abdomen, status post L5-S1 PLIF.  IMPRESSION: Nasogastric tube tip projects in proximal stomach.  Nonspecific bowel gas pattern.  Peritoneal catheter in place, with calcifications projecting in the region of the kidneys bilaterally.   Electronically Signed   By: Elon Alas   On: 08/20/2013 12:45    Anti-infectives: Anti-infectives   Start     Dose/Rate Route Frequency Ordered Stop   08/19/13 0400   ceFAZolin (ANCEF) IVPB 2 g/50 mL premix     2 g 100 mL/hr over 30 Minutes Intravenous  Once 08/19/13 0353 08/19/13 0350   08/19/13 0350  ceFAZolin (ANCEF) 2-3 GM-% IVPB SOLR    Comments:  Barley, Jenny   : cabinet override      08/19/13 0350 08/19/13 1559      Assessment/Plan: s/p Procedure(s): RADIOLOGY WITH ANESTHESIA (N/A)  CVA Lt MCA revasc with IA tpa 5/28 Doing well Moves all 4s intubated- for extubation prob 5/31   LOS: 3 days    Lavonia Drafts 08/21/2013

## 2013-08-21 NOTE — Progress Notes (Addendum)
PT Cancellation Note  Patient Details Name: [name] MRN: [id number] DOB: 06-17-1945   Cancelled Treatment:    Reason Eval/Treat Not Completed: Patient at procedure or test/unavailable, Not medically ready, Bedrest orders remain active at this time (will see when activity appropriate)   Duncan Dull 08/21/2013, 6:36 AM Alben Deeds, PT DPT  [phone]

## 2013-08-22 ENCOUNTER — Inpatient Hospital Stay (HOSPITAL_COMMUNITY): Payer: Medicare Other

## 2013-08-22 LAB — RENAL FUNCTION PANEL
Albumin: 2.2 g/dL — ABNORMAL LOW (ref 3.5–5.2)
BUN: 8 mg/dL (ref 6–23)
CALCIUM: 8.1 mg/dL — AB (ref 8.4–10.5)
CO2: 29 meq/L (ref 19–32)
CREATININE: 1.7 mg/dL — AB (ref 0.50–1.10)
Chloride: 98 mEq/L (ref 96–112)
GFR calc Af Amer: 34 mL/min — ABNORMAL LOW (ref >90)
GFR, EST NON AFRICAN AMERICAN: 30 mL/min — AB (ref >90)
Glucose, Bld: 174 mg/dL — ABNORMAL HIGH (ref 70–99)
Phosphorus: 1.8 mg/dL — ABNORMAL LOW (ref 2.3–4.6)
Potassium: 3.5 mEq/L — ABNORMAL LOW (ref 3.7–5.3)
Sodium: 140 mEq/L (ref 137–147)

## 2013-08-22 LAB — GLUCOSE, CAPILLARY
GLUCOSE-CAPILLARY: 146 mg/dL — AB (ref 70–99)
GLUCOSE-CAPILLARY: 229 mg/dL — AB (ref 70–99)
GLUCOSE-CAPILLARY: 182 mg/dL — AB (ref 70–99)
Glucose-Capillary: 112 mg/dL — ABNORMAL HIGH (ref 70–99)
Glucose-Capillary: 133 mg/dL — ABNORMAL HIGH (ref 70–99)
Glucose-Capillary: 151 mg/dL — ABNORMAL HIGH (ref 70–99)

## 2013-08-22 LAB — CLOSTRIDIUM DIFFICILE BY PCR: CDIFFPCR: NEGATIVE

## 2013-08-22 MED ORDER — BIOTENE DRY MOUTH MT LIQD
15.0000 mL | Freq: Two times a day (BID) | OROMUCOSAL | Status: DC
Start: 2013-08-23 — End: 2013-08-23
  Administered 2013-08-23: 15 mL via OROMUCOSAL

## 2013-08-22 MED ORDER — INSULIN ASPART 100 UNIT/ML ~~LOC~~ SOLN
0.0000 [IU] | Freq: Three times a day (TID) | SUBCUTANEOUS | Status: DC
  Administered 2013-08-23 (×2): 1 [IU] via SUBCUTANEOUS

## 2013-08-22 MED ORDER — ALPRAZOLAM 0.5 MG PO TABS
0.5000 mg | ORAL_TABLET | Freq: Every day | ORAL | Status: DC
  Administered 2013-08-22: 0.5 mg via ORAL
  Filled 2013-08-22: qty 1

## 2013-08-22 MED ORDER — WHITE PETROLATUM GEL
Status: AC
  Administered 2013-08-22: 0.2
  Filled 2013-08-22: qty 5

## 2013-08-22 MED ORDER — PANTOPRAZOLE SODIUM 40 MG IV SOLR
40.0000 mg | Freq: Two times a day (BID) | INTRAVENOUS | Status: DC
Start: 2013-08-22 — End: 2013-08-23
  Administered 2013-08-22 (×2): 40 mg via INTRAVENOUS
  Filled 2013-08-22 (×4): qty 40

## 2013-08-22 MED ORDER — RENA-VITE PO TABS
1.0000 | ORAL_TABLET | Freq: Every day | ORAL | Status: DC
  Administered 2013-08-22: 1 via ORAL
  Filled 2013-08-22 (×2): qty 1

## 2013-08-22 NOTE — Evaluation (Signed)
Physical Therapy Evaluation Patient Details Name: [name] MRN: [id number] DOB: 1945/12/13 Today's Date: 08/22/2013   History of Present Illness  68 year old female presenting with slurred speech and decreased mentation. MRI with multi focal acute ischemic infarcts in the left frontal and parietal lobes, SDH along falx. Intubated for airway protection 5/28-5/31.  Clinical Impression  Pt functioning at min guard level presenting with generalized deconditioning from hospital stay. Pt motivated and demo's resolution of R sided weakness and aphasia. Anticipate pt to be safe for d/c home with family once medically stable.     Follow Up Recommendations Home health PT;Supervision/Assistance - 24 hour    Equipment Recommendations  None recommended by PT    Recommendations for Other Services       Precautions / Restrictions Precautions Precautions: Fall Restrictions Weight Bearing Restrictions: No      Mobility  Bed Mobility Overal bed mobility: Needs Assistance Bed Mobility: Supine to Sit     Supine to sit: Min assist     General bed mobility comments: increased time due to first time out of bed  Transfers Overall transfer level: Needs assistance Equipment used: Rolling walker (2 wheeled) Transfers: Sit to/from Stand Sit to Stand: Min guard         General transfer comment: pt with good technique  Ambulation/Gait Ambulation/Gait assistance: Min guard Ambulation Distance (Feet): 120 Feet Assistive device: Rolling walker (2 wheeled) Gait Pattern/deviations: Step-through pattern;Decreased stride length     General Gait Details: pt with good walker management, cautious at first due to first time up, no episodes of dizzines or LOB  Stairs            Wheelchair Mobility    Modified Rankin (Stroke Patients Only) Modified Rankin (Stroke Patients Only) Pre-Morbid Rankin Score: No symptoms Modified Rankin: Moderate disability     Balance Overall balance  assessment: Needs assistance   Sitting balance-Leahy Scale: Good     Standing balance support: No upper extremity supported Standing balance-Leahy Scale: Fair                               Pertinent Vitals/Pain Denies pain    Home Living Family/patient expects to be discharged to:: Private residence Living Arrangements: Spouse/significant other Available Help at Discharge: Family;Available 24 hours/day Type of Home: House Home Access: Stairs to enter Entrance Stairs-Rails: Can reach both Entrance Stairs-Number of Steps: 5 Home Layout: One level Home Equipment: Walker - 2 wheels;Shower seat      Prior Function Level of Independence: Independent         Comments: was undergoing HHPT and was scheduled to be d/c'd 5/29     Hand Dominance   Dominant Hand: Right    Extremity/Trunk Assessment   Upper Extremity Assessment: Overall WFL for tasks assessed           Lower Extremity Assessment: Overall WFL for tasks assessed      Cervical / Trunk Assessment: Normal  Communication   Communication: No difficulties  Cognition Arousal/Alertness: Awake/alert Behavior During Therapy: WFL for tasks assessed/performed Overall Cognitive Status: Within Functional Limits for tasks assessed                      General Comments      Exercises        Assessment/Plan    PT Assessment Patient needs continued PT services  PT Diagnosis Generalized weakness   PT Problem List  Decreased strength;Decreased activity tolerance;Decreased balance;Decreased mobility  PT Treatment Interventions DME instruction;Gait training;Stair training;Functional mobility training;Therapeutic exercise;Therapeutic activities;Balance training   PT Goals (Current goals can be found in the Care Plan section) Acute Rehab PT Goals Patient Stated Goal: home PT Goal Formulation: With patient Time For Goal Achievement: 08/29/13 Potential to Achieve Goals: Good    Frequency Min  4X/week   Barriers to discharge        Co-evaluation               End of Session Equipment Utilized During Treatment: Gait belt Activity Tolerance: Patient tolerated treatment well Patient left: in chair;with call bell/phone within reach Nurse Communication: Mobility status         Time: 7564-3329 PT Time Calculation (min): 27 min   Charges:   PT Evaluation $Initial PT Evaluation Tier I: 1 Procedure PT Treatments $Gait Training: 8-22 mins   PT G CodesBerline Lopes 08/22/2013, 2:50 PM  Kittie Plater, PT, DPT Pager #: [phone] Office #: [phone]

## 2013-08-22 NOTE — Progress Notes (Signed)
PULMONARY / CRITICAL CARE MEDICINE   Name: [name] MRN: [id number] DOB: 02-09-1946    ADMISSION DATE:  08/18/2013 CONSULTATION DATE:  08/19/2013  REFERRING MD : Neuro  CHIEF COMPLAINT:VDRF post stroke and intervention  BRIEF PATIENT DESCRIPTION:  68 yo female with hx of ESRD and being evaluated for transplant at [facility], also had recent CABG for CAD.  She presented with slurred speech and decreased mental status from CVA.  She was intubated for airway protection, and PCCM consulted to assist with vent management.  SIGNIFICANT EVENTS: 5/28 Admit, neuro IR  STUDIES:  Echo 5/28 >> EF 55 to 60% MRI brain 5/30 >> multi focal acute ischemic infarcts in Lt frontal and parietal lobes, SDH along falx  LINES / TUBES: 5/28 ETT >> 5/31 5/28 Lt fem sheath aline>>5/28 5/28 Rt fem v sheath>>>5/28  CULTURES: C diff 5/30 >>  SUBJECTIVE:   Had HD yesterday with volume removal.  Doing much better with SBT today.  VITAL SIGNS: Temp:  [98.4 F (36.9 C)-99 F (37.2 C)] 98.6 F (37 C) (05/31 0804) Pulse Rate:  [73-87] 76 (05/31 0700) Resp:  [15-28] 18 (05/31 0700) BP: (110-152)/(50-73) 138/59 mmHg (05/31 0700) SpO2:  [96 %-100 %] 100 % (05/31 0700) FiO2 (%):  [40 %] 40 % (05/31 0700) Weight:  [162 lb 11.2 oz (73.8 kg)] 162 lb 11.2 oz (73.8 kg) (05/31 0400) VENTILATOR SETTINGS: Vent Mode:  [-] PRVC FiO2 (%):  [40 %] 40 % Set Rate:  [16 bmp] 16 bmp Vt Set:  [450 mL] 450 mL PEEP:  [5 cmH20] 5 cmH20 Pressure Support:  [10 cmH20] 10 cmH20 Plateau Pressure:  [26 cmH20-29 cmH20] 27 cmH20 INTAKE / OUTPUT: Intake/Output     05/30 0701 - 05/31 0700 05/31 0701 - 06/01 0700   I.V. (mL/kg) 170 (2.3)    NG/GT 890    IV Piggyback 110    Total Intake(mL/kg) 1170 (15.9)    Other 3250    Total Output 3250     Net -2080          Stool Occurrence 5 x      PHYSICAL EXAMINATION: General: mild increased WOB Neuro: RASS 0 HEENT: ETT in place Cardiovascular: regular Lungs: no  wheeze Abdomen:  Peritoneal cath in place and site unremarkable, BS wnl Musculoskeletal: no edema Skin:  Rt bicep av graft + bruit/trill    LABS:  CBC  Recent Labs Lab 08/19/13 0554 08/20/13 0330 08/21/13 1930  WBC 4.9 6.3 5.4  HGB 8.6* 8.6* 8.9*  HCT 28.1* 27.8* 29.3*  PLT 212 214 196   Coag's  Recent Labs Lab 08/19/13 0003 08/20/13 0330  APTT 28 29  INR 1.09 1.21   BMET  Recent Labs Lab 08/21/13 0453 08/21/13 1930 08/22/13 0248  NA 143 140 140  K 3.5* 3.6* 3.5*  CL 100 98 98  CO2 25 28 29   BUN 9 23 8   CREATININE 2.72* 3.77* 1.70*  GLUCOSE 130* 194* 174*   Electrolytes  Recent Labs Lab 08/20/13 0330 08/21/13 0453 08/21/13 1930 08/22/13 0248  CALCIUM 8.2* 7.9* 8.4 8.1*  MG 1.9  --   --   --   PHOS 5.8*  --  4.3 1.8*   Sepsis Markers  Recent Labs Lab 08/19/13 0013  LATICACIDVEN 1.27   ABG  Recent Labs Lab 08/19/13 0910 08/20/13 0920  PHART 7.388 7.245*  PCO2ART 43.9 55.1*  PO2ART 73.0* 75.7*   Liver Enzymes  Recent Labs Lab 08/19/13 0003 08/20/13 0330 08/21/13 1930 08/22/13  0248  AST 18 38* 11  --   ALT 15 10 <5  --   ALKPHOS 76 56 69  --   BILITOT 0.3 0.3 0.4  --   ALBUMIN 2.8* 2.1* 2.1* 2.2*   Cardiac Enzymes  Recent Labs Lab 08/19/13 0554 08/19/13 1339  TROPONINI <0.30 <0.30   Glucose  Recent Labs Lab 08/21/13 1157 08/21/13 1609 08/21/13 1948 08/21/13 2346 08/22/13 0356 08/22/13 0803  GLUCAP 209* 219* 154* 112* 151* 133*    Imaging Ct Head Wo Contrast  08/22/2013   CLINICAL DATA:  Followup intracranial hemorrhage  EXAM: CT HEAD WITHOUT CONTRAST  TECHNIQUE: Contiguous axial images were obtained from the base of the skull through the vertex without intravenous contrast.  COMPARISON:  Prior MRI from 08/20/2013 as well as multiple prior CTs.  FINDINGS: Known left MCA territory infarcts are not definitely visualized on this exam. Previously seen small left parafalcine hemorrhage within the left parietal lobe is  no longer visualized. Eighth vein subdural hemorrhage measuring approximately 2 mm in diameter persists along the falx (series 2, image 24). This is decreased in size relative to the most recent CT from 08/20/2013. No significant mass effect. No new intracranial hemorrhage or infarct.  No mass lesion or midline shift. Ventricles are stable in size without evidence of hydrocephalus. Mild to moderate chronic microvascular ischemic changes again noted.  Calvarium is unchanged. No acute abnormalities seen about the orbits.  Paranasal sinuses and mastoid air cells remain clear.  IMPRESSION: 1. Persistent thin subdural hemorrhage measuring 2 mm in maximal diameter along the falx, decreased in size relative to prior CT from 08/20/2013. No mass effect. 2. Interval resolution of previously identified small left parafalcine hemorrhage within the left parietal lobe. 3. Nonvisualization of known small left MCA territory infarcts. 4. Stable chronic small vessel ischemic disease.   Electronically Signed   By: Jeannine Boga M.D.   On: 08/22/2013 06:28   Mr Brain Wo Contrast  08/21/2013   CLINICAL DATA:  Stroke, status post intervention with catheter directed tPA administration.  EXAM: MRI HEAD WITHOUT CONTRAST  TECHNIQUE: Multiplanar, multiecho pulse sequences of the brain and surrounding structures were obtained without intravenous contrast.  COMPARISON:  Prior CTs from earlier the same day as well as 08/19/2013  FINDINGS: The CSF containing spaces are within normal limits for patient age. Mild scattered T2/FLAIR hyperintensity within the periventricular and deep white matter both cerebral hemispheres is compatible with mild to moderate chronic small vessel ischemic changes. No mass lesion, midline shift, or extra-axial fluid collection. Ventricles are normal in size without evidence of hydrocephalus.  Multiple scattered foci of restricted diffusion are seen involving the cortical gray matter of the left frontal and  parietal lobes in the left MCA distribution. These infarcts are seen on series 4, images 18 through 22. There is involvement of the left insular cortex is well (series 4, image 13). No evidence of associated hemorrhage or significant mass effect. There is associated hyperintense FLAIR signal seen within these infarcts.  Previously seen 1.2 cm hyperdensity within the left parafalcine gray matter of the left parietal lobe is not visualized on this examination, which may have represented contrast staining related to prior procedure. A small amount of acute subarachnoid hemorrhage which has since resorbed is also a possibility. A thin subdural hemorrhage seen along the falx without significant mass effect.  The cervicomedullary junction is normal. Pituitary gland is within normal limits. Pituitary stalk is midline. The globes and optic nerves demonstrate a normal appearance  with normal signal intensity.  The bone marrow signal intensity is normal. Calvarium is intact. Visualized upper cervical spine is within normal limits.  Scalp soft tissues are unremarkable.  Paranasal sinuses are clear.  No mastoid effusion.  IMPRESSION: 1. Multi focal acute ischemic infarcts involving the left frontal and parietal lobes in the left MCA distribution as above. No associated hemorrhage or significant mass effect. 2. Thin subdural hemorrhage along the falx without significant mass effect. This is decreased in size relative to prior CT from 08/19/2013. 3. Mild to moderate chronic microvascular ischemic disease involving the supratentorial white matter.   Electronically Signed   By: Jeannine Boga M.D.   On: 08/21/2013 01:26   Dg Chest Port 1 View  08/22/2013   CLINICAL DATA:  Pleural effusions  EXAM: PORTABLE CHEST - 1 VIEW  COMPARISON:  08/20/2013  FINDINGS: An endotracheal tube 3.4 cm above the carina. Nasogastric tube coursing below the diaphragm with the tip in the stomach.  Trace bilateral pleural effusions, right greater  than left. No focal consolidation or pneumothorax. Mild bilateral interstitial thickening concerning for mild pulmonary edema. Stable cardiomediastinal silhouette. Prior CABG and mitral valve annuloplasty. Unremarkable osseous structures.  IMPRESSION: 1. Trace bilateral pleural effusions and mild pulmonary edema.   Electronically Signed   By: Kathreen Devoid   On: 08/22/2013 08:23   Dg Abd Portable 1v  08/20/2013   CLINICAL DATA:  Orogastric tube placement.  EXAM: PORTABLE ABDOMEN - 1 VIEW  COMPARISON:  Abdominal series January 20, 2007  FINDINGS: Nasogastric tube tip projects in proximal stomach, side port immediately below the gastroesophageal junction. Bowel gas pattern is nondilated and nonobstructive. Calcifications project in the region of the kidneys bilaterally, and could be vascular. Peritoneal catheter in place. Surgical clips project in the pelvis and lower abdomen, status post L5-S1 PLIF.  IMPRESSION: Nasogastric tube tip projects in proximal stomach.  Nonspecific bowel gas pattern.  Peritoneal catheter in place, with calcifications projecting in the region of the kidneys bilaterally.   Electronically Signed   By: Elon Alas   On: 08/20/2013 12:45    ASSESSMENT / PLAN:  PULMONARY A:  Acute respiratory failure in setting of CVA. P:   Proceed with extubation 5/31 F/u CXR intermittently  CARDIOVASCULAR A:  Hx of CAD s/p recent CABG, HTN. P:  Coreg, zocor per primary team Resume ASA when okay with stroke team  RENAL A:   ESRD on HD >> has existing peritoneal catheter in place also; awaiting transplant. P:   HD per renal >> remove fluid as tolerated  GASTROINTESTINAL A:   Hx of GIB. Dysphagia. P:   Swallow evaluation before starting diet after extubation BID protonix  HEMATOLOGIC A:   Anemia of chronic disease. P:  F/u CBC intermittently SCD's  INFECTIOUS A:  No acute issue. P:   Monitor clinically  ENDOCRINE A:  DM type II. P:   SSI  NEUROLOGIC A:    Lt MCA CVA. P:   Per neuro  CC time 35 minutes.   Chesley Mires, MD [facility] Pulmonary/Critical Care 08/22/2013, 8:36 AM Pager:  [phone] After 3pm call: [phone]

## 2013-08-22 NOTE — Progress Notes (Signed)
08/22/2013- Resp care note- Pt weaned and extubated at per MD to 3lpm cannula at [place].  Pt sats 99% on 3lpm cannula, hr93, rr25, bp142/40.  Pt tolerated procedure well with pt vocalizing post extubation. Vent and ambu bag at bedside.  Will monitor progress. s Dawn Convery rrt, rcp

## 2013-08-22 NOTE — Evaluation (Signed)
Clinical/Bedside Swallow Evaluation Patient Details  Name: [name] MRN: [id number] Date of Birth: 07-07-1945  Today's Date: 08/22/2013 Time: 1306-1322 SLP Time Calculation (min): 16 min  Past Medical History:  Past Medical History  Diagnosis Date  . Diabetes mellitus   . Hypertension   . Hyperlipidemia   . Coronary artery disease   . Dialysis patient     T/Th/[facility]-Dr Hinda Lenis, stops hemodialysis 11-28-2012, will do peritoneal dialysis  starting 12-01-2012   . Carpal tunnel syndrome   . Anemia   . Ovarian cancer 1995    [name]  . Colon polyps 01/2012    Per colonoscopy, Dr. Gala Romney  . CKD (chronic kidney disease)     peritoneal dialysis at home nightly.  . Transfusion history 03-04-13    Transfusion x1 unit a month ago-[facility]  . Stroke 2010    2010-"brief weakness,tongue thickness,incoherent"-no residual affects.  . Mitral regurgitation   . Myocardial infarction 2009  . Peritonitis 06/23/2013   Past Surgical History:  Past Surgical History  Procedure Laterality Date  . Complete abdominal hysterectomy  1995  . Abdominal hysterectomy    . Colonoscopy with esophagogastroduodenoscopy (egd)  01/27/2012    Dr. Gala Romney. Small hiatal hernia, abnormal second portion of the duodenum with questionable extrinsic compression. Single left sided colonic diverticulum, 2 colon polyps. Hamartomatous colon polyp  . Eus  04/09/2012    Dr. Owens Loffler: Nonspecific edema/thickening of the periampullary duodenum. Unremarkable biopsy  . Tonsillectomy      child  . Peritoneal catheter insertion  10/30/2012    for peritoneal dialysis  . Back surgery  2009    x3 level fusion-Dr. Louanne Skye  . Cardiac catheterization      x2 stents-'09- Dr. Joellen Jersey  . Esophagogastroduodenoscopy (egd) with propofol N/A 03/11/2013    Dr. Owens Loffler, small duodenal diverticulum. Edematous duodenal mucosa with friability on biopsy (pathology negative)  . Three-vessel cabg, mitral  valve annuloplasty, may ease  06/17/2013    [facility]  . Coronary artery bypass graft    . Esophagogastroduodenoscopy N/A 07/02/2013    SLF:GI BLEED DUE TO LARGE DUODENAL ULCER/Small hiatal hernia/PYLORIC CHANNEL ULCER/Multiple CLEANED BASED ulcers in the duodenal bulb and 2nd part of the duodenum  . Givens capsule study N/A 07/02/2013    Procedure: GIVENS CAPSULE STUDY;  Surgeon: Danie Binder, MD;  Location: [facility] ENDO SUITE;  Service: Endoscopy;  Laterality: N/A;  possible deployment in EGD negative   HPI:  68 year old female presenting with slurred speech and decreased mentation. MRI with multi focal acute ischemic infarcts in the left frontal and parietal lobes, SDH along falx. Intubated for airway protection 5/28-5/31.    Assessment / Plan / Recommendation Clinical Impression  Patient presents with a functional oropharyngeal swallow without observed s/s of aspiration. No SLP f/u indicated at this time.     Aspiration Risk  Mild    Diet Recommendation Regular;Thin liquid   Liquid Administration via: Cup;Straw Medication Administration: Whole meds with liquid Supervision: Patient able to self feed Compensations: Slow rate;Small sips/bites Postural Changes and/or Swallow Maneuvers: Seated upright 90 degrees    Other  Recommendations Oral Care Recommendations: Oral care BID   Follow Up Recommendations  None    Frequency and Duration        Pertinent Vitals/Pain n/a       Swallow Study    General HPI: 68 year old female presenting with slurred speech and decreased mentation. MRI with multi focal acute ischemic infarcts in the left frontal  and parietal lobes, SDH along falx. Intubated for airway protection 5/28-5/31.  Type of Study: Bedside swallow evaluation Previous Swallow Assessment: none Diet Prior to this Study: NPO Temperature Spikes Noted: No Respiratory Status: Nasal cannula History of Recent Intubation: Yes Length of Intubations (days): 3 days Date extubated:  08/22/13 Behavior/Cognition: Alert;Cooperative;Pleasant mood Oral Cavity - Dentition: Dentures, top;Dentures, bottom Self-Feeding Abilities: Able to feed self Patient Positioning: Upright in bed Baseline Vocal Quality: Hoarse (mild) Volitional Cough: Strong Volitional Swallow: Able to elicit    Oral/Motor/Sensory Function Overall Oral Motor/Sensory Function: Appears within functional limits for tasks assessed   Ice Chips Ice chips: Not tested   Thin Liquid Thin Liquid: Within functional limits Presentation: Cup;Self Fed;Straw    Nectar Thick Nectar Thick Liquid: Not tested   Honey Thick Honey Thick Liquid: Not tested   Puree Puree: Within functional limits Presentation: Spoon;Self Fed   Solid   GO   Francesco Provencal MA, CCC-SLP [phone]  Solid: Within functional limits Presentation: [place] 08/22/2013,1:31 PM

## 2013-08-22 NOTE — Progress Notes (Signed)
Subjective: Interval History: has no complaint, did well on HD.  Objective: Vital signs in last 24 hours: Temp:  [98.4 F (36.9 C)-99 F (37.2 C)] 98.6 F (37 C) (05/31 0804) Pulse Rate:  [73-87] 78 (05/31 0750) Resp:  [15-28] 19 (05/31 0750) BP: (110-152)/(50-73) 138/59 mmHg (05/31 0750) SpO2:  [96 %-100 %] 100 % (05/31 0750) FiO2 (%):  [40 %] 40 % (05/31 0750) Weight:  [73.8 kg (162 lb 11.2 oz)] 73.8 kg (162 lb 11.2 oz) (05/31 0400) Weight change: -5.2 kg (-11 lb 7.4 oz)  Intake/Output from previous day: 05/30 0701 - 05/31 0700 In: 1170 [I.V.:170; NG/GT:890; IV Piggyback:110] Out: 3250  Intake/Output this shift:    General appearance: cooperative, pale and slowed mentation Resp: rales bibasilar Cardio: S1, S2 normal and systolic murmur: holosystolic 2/6, blowing at apex GI: pos bs, soft, PD cath L mid abdm, liver down 4 cm Extremities: edema 2+, AVF RUA  Lab Results:  Recent Labs  08/20/13 0330 08/21/13 1930  WBC 6.3 5.4  HGB 8.6* 8.9*  HCT 27.8* 29.3*  PLT 214 196   BMET:  Recent Labs  08/21/13 1930 08/22/13 0248  NA 140 140  K 3.6* 3.5*  CL 98 98  CO2 28 29  GLUCOSE 194* 174*  BUN 23 8  CREATININE 3.77* 1.70*  CALCIUM 8.4 8.1*   No results found for this basename: PTH,  in the last 72 hours Iron Studies:  Recent Labs  08/20/13 1245  IRON 21*  TIBC 132*    Studies/Results: Ct Head Wo Contrast  08/22/2013   CLINICAL DATA:  Followup intracranial hemorrhage  EXAM: CT HEAD WITHOUT CONTRAST  TECHNIQUE: Contiguous axial images were obtained from the base of the skull through the vertex without intravenous contrast.  COMPARISON:  Prior MRI from 08/20/2013 as well as multiple prior CTs.  FINDINGS: Known left MCA territory infarcts are not definitely visualized on this exam. Previously seen small left parafalcine hemorrhage within the left parietal lobe is no longer visualized. Eighth vein subdural hemorrhage measuring approximately 2 mm in diameter  persists along the falx (series 2, image 24). This is decreased in size relative to the most recent CT from 08/20/2013. No significant mass effect. No new intracranial hemorrhage or infarct.  No mass lesion or midline shift. Ventricles are stable in size without evidence of hydrocephalus. Mild to moderate chronic microvascular ischemic changes again noted.  Calvarium is unchanged. No acute abnormalities seen about the orbits.  Paranasal sinuses and mastoid air cells remain clear.  IMPRESSION: 1. Persistent thin subdural hemorrhage measuring 2 mm in maximal diameter along the falx, decreased in size relative to prior CT from 08/20/2013. No mass effect. 2. Interval resolution of previously identified small left parafalcine hemorrhage within the left parietal lobe. 3. Nonvisualization of known small left MCA territory infarcts. 4. Stable chronic small vessel ischemic disease.   Electronically Signed   By: Jeannine Boga M.D.   On: 08/22/2013 06:28   Mr Brain Wo Contrast  08/21/2013   CLINICAL DATA:  Stroke, status post intervention with catheter directed tPA administration.  EXAM: MRI HEAD WITHOUT CONTRAST  TECHNIQUE: Multiplanar, multiecho pulse sequences of the brain and surrounding structures were obtained without intravenous contrast.  COMPARISON:  Prior CTs from earlier the same day as well as 08/19/2013  FINDINGS: The CSF containing spaces are within normal limits for patient age. Mild scattered T2/FLAIR hyperintensity within the periventricular and deep white matter both cerebral hemispheres is compatible with mild to moderate chronic small vessel  ischemic changes. No mass lesion, midline shift, or extra-axial fluid collection. Ventricles are normal in size without evidence of hydrocephalus.  Multiple scattered foci of restricted diffusion are seen involving the cortical gray matter of the left frontal and parietal lobes in the left MCA distribution. These infarcts are seen on series 4, images 18  through 22. There is involvement of the left insular cortex is well (series 4, image 13). No evidence of associated hemorrhage or significant mass effect. There is associated hyperintense FLAIR signal seen within these infarcts.  Previously seen 1.2 cm hyperdensity within the left parafalcine gray matter of the left parietal lobe is not visualized on this examination, which may have represented contrast staining related to prior procedure. A small amount of acute subarachnoid hemorrhage which has since resorbed is also a possibility. A thin subdural hemorrhage seen along the falx without significant mass effect.  The cervicomedullary junction is normal. Pituitary gland is within normal limits. Pituitary stalk is midline. The globes and optic nerves demonstrate a normal appearance with normal signal intensity.  The bone marrow signal intensity is normal. Calvarium is intact. Visualized upper cervical spine is within normal limits.  Scalp soft tissues are unremarkable.  Paranasal sinuses are clear.  No mastoid effusion.  IMPRESSION: 1. Multi focal acute ischemic infarcts involving the left frontal and parietal lobes in the left MCA distribution as above. No associated hemorrhage or significant mass effect. 2. Thin subdural hemorrhage along the falx without significant mass effect. This is decreased in size relative to prior CT from 08/19/2013. 3. Mild to moderate chronic microvascular ischemic disease involving the supratentorial white matter.   Electronically Signed   By: Jeannine Boga M.D.   On: 08/21/2013 01:26   Dg Chest Port 1 View  08/22/2013   CLINICAL DATA:  Pleural effusions  EXAM: PORTABLE CHEST - 1 VIEW  COMPARISON:  08/20/2013  FINDINGS: An endotracheal tube 3.4 cm above the carina. Nasogastric tube coursing below the diaphragm with the tip in the stomach.  Trace bilateral pleural effusions, right greater than left. No focal consolidation or pneumothorax. Mild bilateral interstitial thickening  concerning for mild pulmonary edema. Stable cardiomediastinal silhouette. Prior CABG and mitral valve annuloplasty. Unremarkable osseous structures.  IMPRESSION: 1. Trace bilateral pleural effusions and mild pulmonary edema.   Electronically Signed   By: Kathreen Devoid   On: 08/22/2013 08:23   Dg Abd Portable 1v  08/20/2013   CLINICAL DATA:  Orogastric tube placement.  EXAM: PORTABLE ABDOMEN - 1 VIEW  COMPARISON:  Abdominal series January 20, 2007  FINDINGS: Nasogastric tube tip projects in proximal stomach, side port immediately below the gastroesophageal junction. Bowel gas pattern is nondilated and nonobstructive. Calcifications project in the region of the kidneys bilaterally, and could be vascular. Peritoneal catheter in place. Surgical clips project in the pelvis and lower abdomen, status post L5-S1 PLIF.  IMPRESSION: Nasogastric tube tip projects in proximal stomach.  Nonspecific bowel gas pattern.  Peritoneal catheter in place, with calcifications projecting in the region of the kidneys bilaterally.   Electronically Signed   By: Elon Alas   On: 08/20/2013 12:45    I have reviewed the patient's current medications.  Assessment/Plan: 1 CRF still vol xs. Will do HD on Tues and lower.   2 Anemia stable, on epo 3 DM stable 4 CVA per neuro 5 HPTH 6 HTN lower vol 7 GIB in past P HD Tues, epo, stroke care    LOS: 4 days   Ameena Vesey L Shewanda Sharpe 08/22/2013,10:04 AM

## 2013-08-22 NOTE — Progress Notes (Signed)
Stroke Team Progress Note  HISTORY [name] is a 68 y.o. female who was last seen normal around 10:30 at night 08/18/2013 when she had some slurred speech. She subsequently resolved, but then as they're getting into bed again she had garbled speech and he decided to bring her into the emergency room. She was brought into the ER, and in the ER she was noticed to get significantly worse and became unresponsive for a brief period of time. She became globally aphasic and there is concern briefly that she was not protecting her airway, but her mental status once again improved to the point where she was not intubated.   She had open heart surgery done in early March, she had some GI bleeding done in early April which was cauterized. Husband reports no issues with bleeding since that time and her hemoglobin here is significantly higher than at discharge. She undergoes hemodialysis, but does have a peritoneal dialysis catheter in place. The need for heart surgery in March was apparently found on evaluation for kidney transplant surgery and this surgery was undertaken in anticipation of kidney transplantation.   CTA demonstrated L M2 occlusion. Patient was administered TPA. She was taken to neuro intervention where she had near complete revascularization of L MCA M2-M3 branches of the superior division of L MCA with IA tPA.  She was admitted to the neuro ICU for further evaluation and treatment.  SUBJECTIVE The patient's husband is at the bedside. The patient was extubated this morning. She appears very alert and seems to be doing well.  Speech is better but still with mild disfluency.  OBJECTIVE Most recent Vital Signs: Filed Vitals:   08/22/13 0400 08/22/13 0500 08/22/13 0600 08/22/13 0700  BP: 118/58 129/71 131/56 138/59  Pulse: 77 81 73 76  Temp:      TempSrc:      Resp: 16 17 16 18   Height:      Weight: 162 lb 11.2 oz (73.8 kg)     SpO2: 100% 100% 100% 100%   CBG (last 3)   Recent Labs   08/21/13 1948 08/21/13 2346 08/22/13 0356  GLUCAP 154* 112* 151*    IV Fluid Intake:   . sodium chloride 10 mL/hr at 08/22/13 0700  . feeding supplement (NEPRO CARB STEADY) 1,000 mL (08/20/13 1301)  . niCARDipine Stopped (08/21/13 0054)    MEDICATIONS  . ALPRAZolam  0.25 mg Oral QAC breakfast  . ALPRAZolam  0.5 mg Per Tube QHS  . antiseptic oral rinse  15 mL Mouth Rinse QID  . chlorhexidine  15 mL Mouth Rinse BID  . darbepoetin (ARANESP) injection - DIALYSIS  150 mcg Intravenous Q Sat-HD  . enoxaparin (LOVENOX) injection  30 mg Subcutaneous Q24H  . feeding supplement (PRO-STAT SUGAR FREE 64)  30 mL Per Tube 5 X Daily  . ferric gluconate (FERRLECIT/NULECIT) IV  125 mg Intravenous Q T,Th,Sa-HD  . insulin aspart  0-9 Units Subcutaneous 6 times per day  . pantoprazole sodium  40 mg Per Tube BID   PRN:  sodium chloride, sodium chloride, acetaminophen (TYLENOL) oral liquid 160 mg/5 mL, feeding supplement (NEPRO CARB STEADY), fentaNYL, fentaNYL, heparin, heparin, labetalol, lidocaine (PF), lidocaine-prilocaine, midazolam, ondansetron (ZOFRAN) IV, pentafluoroprop-tetrafluoroeth  Diet:  NPO  Activity:  Bedrest DVT Prophylaxis:  SCDs   CLINICALLY SIGNIFICANT STUDIES Basic Metabolic Panel:   Recent Labs Lab 08/20/13 0330  08/21/13 1930 08/22/13 0248  NA 141  < > 140 140  K 5.0  < > 3.6* 3.5*  CL 102  < > 98 98  CO2 22  < > 28 29  GLUCOSE 109*  < > 194* 174*  BUN 24*  < > 23 8  CREATININE 5.93*  < > 3.77* 1.70*  CALCIUM 8.2*  < > 8.4 8.1*  MG 1.9  --   --   --   PHOS 5.8*  --  4.3 1.8*  < > = values in this interval not displayed. Liver Function Tests:   Recent Labs Lab 08/20/13 0330 08/21/13 1930 08/22/13 0248  AST 38* 11  --   ALT 10 <5  --   ALKPHOS 56 69  --   BILITOT 0.3 0.4  --   PROT 6.1 6.2  --   ALBUMIN 2.1* 2.1* 2.2*   CBC:   Recent Labs Lab 08/19/13 0554 08/20/13 0330 08/21/13 1930  WBC 4.9 6.3 5.4  NEUTROABS 4.1 5.0  --   HGB 8.6* 8.6* 8.9*   HCT 28.1* 27.8* 29.3*  MCV 102.9* 101.5* 103.2*  PLT 212 214 196   Coagulation:   Recent Labs Lab 08/19/13 0003 08/20/13 0330  LABPROT 13.9 15.0  INR 1.09 1.21   Cardiac Enzymes:   Recent Labs Lab 08/19/13 0554 08/19/13 1339  TROPONINI <0.30 <0.30   Urinalysis: No results found for this basename: COLORURINE, APPERANCEUR, LABSPEC, PHURINE, GLUCOSEU, HGBUR, BILIRUBINUR, KETONESUR, PROTEINUR, UROBILINOGEN, NITRITE, LEUKOCYTESUR,  in the last 168 hours Lipid Panel    Component Value Date/Time   CHOL 109 08/19/2013 0554   TRIG 79 08/19/2013 0554   HDL 51 08/19/2013 0554   CHOLHDL 2.1 08/19/2013 0554   VLDL 16 08/19/2013 0554   LDLCALC 42 08/19/2013 0554   HgbA1C  Lab Results  Component Value Date   HGBA1C 6.7* 08/19/2013    Urine Drug Screen:   No results found for this basename: labopia,  cocainscrnur,  labbenz,  amphetmu,  thcu,  labbarb    Alcohol Level: No results found for this basename: ETH,  in the last 168 hours    CT of the brain   08/20/2013 1. No acute large vessel territory infarct identified. 2. Stable 1.2 cm hyperdensity within the left parafalcine gray matter within the left parietal lobe. Finding is stable as compared to most recent CT, but new relative to initial CT performed prior to catheter directed arteriogram and tPA administration. It is unclear whether this opacity represents a small amount of blood products or is related to recent procedure. Continued followup recommended. 3. Increased hyperdensity along the falx as compared to initial CT from 08/19/2013, with decreased hyperdensity along the left tentorium. Again, it is unclear whether this represents a small  amount of subdural hemorrhage versus sequelae of recent intervention. 08/19/2013    1. New 1.2 cm hyperdensity within the left parafalcine gray matter of the left parietal lobe. Finding is concerning for possible new subarachnoid/intraparenchymal hemorrhage. 2. Increased hyperdensity along the falx  and left tentorium. While this finding may be related to recent procedure, possible acute subdural hemorrhage could also have this appearance. A close interval followup CT would likely be helpful to further evaluate these findings.   08/19/2013    1. No acute intracranial pathology seen on CT. 2. Scattered small vessel ischemic microangiopathy.   CT head without contrast 08/22/2013 1. Persistent thin subdural hemorrhage measuring 2 mm in maximal diameter along the falx, decreased in size relative to prior CT from 08/20/2013. No mass effect. 2. Interval resolution of previously identified small left parafalcine hemorrhage within the left parietal  lobe. 3. Nonvisualization of known small left MCA territory infarcts. 4. Stable chronic small vessel ischemic disease.    CT Angio Head  08/19/2013    1. Occlusion of a left M2 branch supplying the anterior division of the left MCA artery territory as above. 2. No other proximal branch occlusion or high-grade flow-limiting stenosis identified within the anterior circulation.   CT Angio Neck 08/19/2013   1. Atherosclerotic plaque at the mid origin of the right internal carotid artery with associated short segment stenosis of approximately 50% by NASCET criteria. 2. Atherosclerotic plaque at the origin of the left internal carotid artery with associated stenosis of approximately 30-40% by NASCET criteria. 3. No high-grade stenosis or vascular occlusion identified within the neck. 4. Moderate bilateral pleural effusions, right greater than left. 5. Patchy and ground-glass opacity within the partially visualized upper lobes, right greater than left. Findings are indeterminate, and may represent edema and/or infection. Clinical correlation with dedicated chest radiograph recommended. 6. Left supraclavicular adenopathy as above, of uncertain etiology, and may be reactive in nature.   Cerebral Angio 08/19/2013 S/P lt common carotid arteriogram followed by near complete  revascularization of lt MCA M2-M 3branches of superior division of LT MCA with 15 mg of superselective IA TPA   MRI of the brain   1. Multi focal acute ischemic infarcts involving the left frontal and parietal lobes in the left MCA distribution as above. No associated hemorrhage or significant mass effect. 2. Thin subdural hemorrhage along the falx without significant mass effect. This is decreased in size relative to prior CT from 08/19/2013. 3. Mild to moderate chronic microvascular ischemic disease involving the supratentorial white matter.  2D Echocardiogram  - EF 55 - 60%. No cardiac source of emboli identified.  CXR  08/20/2013 Mildly improved pulmonary edema most evident in the left mid and lower lung. Persistent small effusions. No new abnormalities. Support apparatus remains well-positioned. 08/19/2013   CHF with bibasilar effusions and atelectasis, unable to exclude consolidation in LEFT lower lobe. 08/22/2013 - Trace bilateral pleural effusions and mild pulmonary edema.    EKG  atrial fibrillation, rate 76. For complete results please see formal report.   Therapy Recommendations - Pending  Physical Exam   Middle aged 31 lady intubated. .Awake alert. Afebrile. Head is nontraumatic. Neck is supple without bruit. Hearing is normal. Cardiac exam no murmur or gallop. Lungs are clear to auscultation. Distal pulses are well felt. Neurological Exam : Awake alert and interactive. Follows simple midline and one-step commands well. Pupils equal reactive. Vision acuity and feels inadequate. Extraocular moments are full range without nystagmus. Face is symmetric. Tongue is midline. Able to move both upper extremities well against gravity left and greater than right. Mild right grip and intrinsic hand muscle weakness. Diminished fine finger movements on the right. Orbits left over right upper extremity. no focal LE weakness.Plantars downgoing bilaterally.  ASSESSMENT [name] is a 68 y.o. female presenting with slurred speech that progressed to global aphasia with momentary loss of consciousness and right hemirparesis. Status post IV t-PA 08/19/2013 at 0052 followed by near complete revascularization of L MCA M2-M3 branches of the superior division of L MCA with IA tPA. Suspect a small left MCA infarct. Repeat CT does show left parafalcine post tPA asymptomatic hemorrhage.  Infarct likely embolic secondary to atrial fibrillation see on EKG.  On no antithrombotics prior to admission. Now on no antithrombotics given asymptomatic hemorrhage for secondary stroke prevention. Patient with resultant VDRF, mild right hand  hemiparesis. Stroke work up underway.  Acute respiratory failure, CCM following - now extubated.  Effusion  COPD. Uses 02  atrial fibrillation per admitting EKG, new finding. Old records indicate atrial arrythmia. Has not been on anticoagulation Hypertension. On cardene post intervention, now off.  Hyperlipidemia, LDL 42, on zocor 40 PTA, now on no statin as NPO, goal LDL < 70 for diabetics Diabetes, HgbA1c 5.6, goal < 7.0 CAD - MI Hx stroke 2010, no residual deficits  ESRD, on hemodialysis Tues, Thurs and Sat, [facility]. Renal following. Has peritoneal cath x 1 yr.  Anemia post tPA 12->8.6 Hx recent GIB requiring cautery Hx ovarian cancer, 1995, [place], in remission Known post CABG L leg wound with dressing. Weekly dressing  Hospital day # 4  TREATMENT/PLAN  Plan to add aspirin for secondary stroke prevention once post tPA hemorrhage becomes isodense. See CT performed 08/22/2013 as noted above. Consider anticoagulation given atrial fibrillation at time of discharge.   SBP goal < 160  On lovenox for VTE prophylaxis   Xanax changed to scheduled bid dose  CCM to manage medical issues, renal consult   SIGNED Mikey Bussing PA-C [facility] Pager [phone] 08/22/2013, 7:56 AM I have personally obtained a history,  examined the patient, evaluated imaging results, and formulated the assessment and plan of care. I agree with the above. Antony Contras, MD  To contact Stroke Continuity provider, please refer to [email or web address] After hours, contact General Neurology

## 2013-08-23 ENCOUNTER — Encounter (HOSPITAL_COMMUNITY): Payer: Self-pay | Admitting: Interventional Radiology

## 2013-08-23 DIAGNOSIS — I4891 Unspecified atrial fibrillation: Secondary | ICD-10-CM | POA: Diagnosis not present

## 2013-08-23 LAB — GLUCOSE, CAPILLARY
GLUCOSE-CAPILLARY: 139 mg/dL — AB (ref 70–99)
Glucose-Capillary: 149 mg/dL — ABNORMAL HIGH (ref 70–99)

## 2013-08-23 LAB — BASIC METABOLIC PANEL
BUN: 21 mg/dL (ref 6–23)
CALCIUM: 8.6 mg/dL (ref 8.4–10.5)
CO2: 28 mEq/L (ref 19–32)
CREATININE: 3.26 mg/dL — AB (ref 0.50–1.10)
Chloride: 96 mEq/L (ref 96–112)
GFR calc non Af Amer: 13 mL/min — ABNORMAL LOW (ref >90)
GFR, EST AFRICAN AMERICAN: 16 mL/min — AB (ref >90)
Glucose, Bld: 146 mg/dL — ABNORMAL HIGH (ref 70–99)
Potassium: 3.6 mEq/L — ABNORMAL LOW (ref 3.7–5.3)
Sodium: 138 mEq/L (ref 137–147)

## 2013-08-23 MED ORDER — PANTOPRAZOLE SODIUM 40 MG PO TBEC
40.0000 mg | DELAYED_RELEASE_TABLET | Freq: Two times a day (BID) | ORAL | Status: DC
  Administered 2013-08-23: 40 mg via ORAL
  Filled 2013-08-23: qty 1

## 2013-08-23 NOTE — Progress Notes (Signed)
Stroke Team Progress Note  HISTORY [name] is a 68 y.o. female who was last seen normal around 10:30 at night 08/18/2013 when she had some slurred speech. She subsequently resolved, but then as they're getting into bed again she had garbled speech and he decided to bring her into the emergency room. She was brought into the ER, and in the ER she was noticed to get significantly worse and became unresponsive for a brief period of time. She became globally aphasic and there is concern briefly that she was not protecting her airway, but her mental status once again improved to the point where she was not intubated.   She had open heart surgery done in early March, she had some GI bleeding done in early April which was cauterized. Husband reports no issues with bleeding since that time and her hemoglobin here is significantly higher than at discharge. She undergoes hemodialysis, but does have a peritoneal dialysis catheter in place. The need for heart surgery in March was apparently found on evaluation for kidney transplant surgery and this surgery was undertaken in anticipation of kidney transplantation.   CTA demonstrated L M2 occlusion. Patient was administered TPA. She was taken to neuro intervention where she had near complete revascularization of L MCA M2-M3 branches of the superior division of L MCA with IA tPA.  She was admitted to the neuro ICU for further evaluation and treatment.  SUBJECTIVE Patient up in the chair at the bedside. No family/friends present.  OBJECTIVE Most recent Vital Signs: Filed Vitals:   08/23/13 0500 08/23/13 0513 08/23/13 0600 08/23/13 0700  BP:  111/61 106/56 142/95  Pulse: 79 79 72 83  Temp:    97.8 F (36.6 C)  TempSrc:    Oral  Resp: 25 22 22 17   Height:      Weight:      SpO2: 95%  97% 93%   CBG (last 3)   Recent Labs  08/22/13 1213 08/22/13 1601 08/22/13 2220  GLUCAP 146* 229* 182*    IV Fluid Intake:   . sodium chloride 10 mL/hr at  08/23/13 0700  . niCARDipine Stopped (08/21/13 0054)    MEDICATIONS  . ALPRAZolam  0.25 mg Oral QAC breakfast  . ALPRAZolam  0.5 mg Oral QHS  . antiseptic oral rinse  15 mL Mouth Rinse BID  . darbepoetin (ARANESP) injection - DIALYSIS  150 mcg Intravenous Q Sat-HD  . enoxaparin (LOVENOX) injection  30 mg Subcutaneous Q24H  . ferric gluconate (FERRLECIT/NULECIT) IV  125 mg Intravenous Q T,Th,Sa-HD  . insulin aspart  0-9 Units Subcutaneous TID WC  . multivitamin  1 tablet Oral QHS  . pantoprazole (PROTONIX) IV  40 mg Intravenous Q12H   PRN:  sodium chloride, sodium chloride, acetaminophen (TYLENOL) oral liquid 160 mg/5 mL, fentaNYL, heparin, heparin, labetalol, lidocaine (PF), lidocaine-prilocaine, ondansetron (ZOFRAN) IV, pentafluoroprop-tetrafluoroeth  Diet:  Carb Control thin liquids Activity:  Bedrest DVT Prophylaxis:  Lovenox 30 mg sq daily   CLINICALLY SIGNIFICANT STUDIES Basic Metabolic Panel:   Recent Labs Lab 08/20/13 0330  08/21/13 1930 08/22/13 0248 08/23/13 0249  NA 141  < > 140 140 138  K 5.0  < > 3.6* 3.5* 3.6*  CL 102  < > 98 98 96  CO2 22  < > 28 29 28   GLUCOSE 109*  < > 194* 174* 146*  BUN 24*  < > 23 8 21   CREATININE 5.93*  < > 3.77* 1.70* 3.26*  CALCIUM 8.2*  < > 8.4  8.1* 8.6  MG 1.9  --   --   --   --   PHOS 5.8*  --  4.3 1.8*  --   < > = values in this interval not displayed. Liver Function Tests:   Recent Labs Lab 08/20/13 0330 08/21/13 1930 08/22/13 0248  AST 38* 11  --   ALT 10 <5  --   ALKPHOS 56 69  --   BILITOT 0.3 0.4  --   PROT 6.1 6.2  --   ALBUMIN 2.1* 2.1* 2.2*   CBC:   Recent Labs Lab 08/19/13 0554 08/20/13 0330 08/21/13 1930  WBC 4.9 6.3 5.4  NEUTROABS 4.1 5.0  --   HGB 8.6* 8.6* 8.9*  HCT 28.1* 27.8* 29.3*  MCV 102.9* 101.5* 103.2*  PLT 212 214 196   Coagulation:   Recent Labs Lab 08/19/13 0003 08/20/13 0330  LABPROT 13.9 15.0  INR 1.09 1.21   Cardiac Enzymes:   Recent Labs Lab 08/19/13 0554  08/19/13 1339  TROPONINI <0.30 <0.30   Urinalysis: No results found for this basename: COLORURINE, APPERANCEUR, LABSPEC, PHURINE, GLUCOSEU, HGBUR, BILIRUBINUR, KETONESUR, PROTEINUR, UROBILINOGEN, NITRITE, LEUKOCYTESUR,  in the last 168 hours Lipid Panel    Component Value Date/Time   CHOL 109 08/19/2013 0554   TRIG 79 08/19/2013 0554   HDL 51 08/19/2013 0554   CHOLHDL 2.1 08/19/2013 0554   VLDL 16 08/19/2013 0554   LDLCALC 42 08/19/2013 0554   HgbA1C  Lab Results  Component Value Date   HGBA1C 6.7* 08/19/2013    Urine Drug Screen:   No results found for this basename: labopia,  cocainscrnur,  labbenz,  amphetmu,  thcu,  labbarb    Alcohol Level: No results found for this basename: ETH,  in the last 168 hours    CT of the brain   08/22/2013 1. Persistent thin subdural hemorrhage measuring 2 mm in maximal diameter along the falx, decreased in size relative to prior CT from 08/20/2013. No mass effect. 2. Interval resolution of previously identified small left parafalcine hemorrhage within the left parietal lobe. 3. Nonvisualization of known small left MCA territory infarcts. 4. Stable chronic small vessel ischemic disease. 08/20/2013 1. No acute large vessel territory infarct identified. 2. Stable 1.2 cm hyperdensity within the left parafalcine gray matter within the left parietal lobe. Finding is stable as compared to most recent CT, but new relative to initial CT performed prior to catheter directed arteriogram and tPA administration. It is unclear whether this opacity represents a small amount of blood products or is related to recent procedure. Continued followup recommended. 3. Increased hyperdensity along the falx as compared to initial CT from 08/19/2013, with decreased hyperdensity along the left tentorium. Again, it is unclear whether this represents a small  amount of subdural hemorrhage versus sequelae of recent intervention. 08/19/2013    1. New 1.2 cm hyperdensity within the left  parafalcine gray matter of the left parietal lobe. Finding is concerning for possible new subarachnoid/intraparenchymal hemorrhage. 2. Increased hyperdensity along the falx and left tentorium. While this finding may be related to recent procedure, possible acute subdural hemorrhage could also have this appearance. A close interval followup CT would likely be helpful to further evaluate these findings.   08/19/2013    1. No acute intracranial pathology seen on CT. 2. Scattered small vessel ischemic microangiopathy.   CT Angio Head  08/19/2013    1. Occlusion of a left M2 branch supplying the anterior division of the left MCA artery territory as  above. 2. No other proximal branch occlusion or high-grade flow-limiting stenosis identified within the anterior circulation.   CT Angio Neck 08/19/2013   1. Atherosclerotic plaque at the mid origin of the right internal carotid artery with associated short segment stenosis of approximately 50% by NASCET criteria. 2. Atherosclerotic plaque at the origin of the left internal carotid artery with associated stenosis of approximately 30-40% by NASCET criteria. 3. No high-grade stenosis or vascular occlusion identified within the neck. 4. Moderate bilateral pleural effusions, right greater than left. 5. Patchy and ground-glass opacity within the partially visualized upper lobes, right greater than left. Findings are indeterminate, and may represent edema and/or infection. Clinical correlation with dedicated chest radiograph recommended. 6. Left supraclavicular adenopathy as above, of uncertain etiology, and may be reactive in nature.   Cerebral Angio 08/19/2013 S/P lt common carotid arteriogram followed by near complete revascularization of lt MCA M2-M 3branches of superior division of LT MCA with 15 mg of superselective IA TPA   MRI of the brain  08/21/2013 1. Multi focal acute ischemic infarcts involving the left frontal and parietal lobes in the left MCA distribution as  above. No associated hemorrhage or significant mass effect. 2. Thin subdural hemorrhage along the falx without significant mass effect. This is decreased in size relative to prior CT from 08/19/2013. 3. Mild to moderate chronic microvascular ischemic disease involving the supratentorial white matter.  2D Echocardiogram  - EF 55 - 60%. No cardiac source of emboli identified.  CXR  08/20/2013 Mildly improved pulmonary edema most evident in the left mid and lower lung. Persistent small effusions. No new abnormalities. Support apparatus remains well-positioned. 08/19/2013   CHF with bibasilar effusions and atelectasis, unable to exclude consolidation in LEFT lower lobe. 08/22/2013 - Trace bilateral pleural effusions and mild pulmonary edema.  EKG  atrial fibrillation, rate 76. For complete results please see formal report.   Therapy Recommendations HH PT  Physical Exam   Middle aged 65 lady intubated. .Awake alert. Afebrile. Head is nontraumatic. Neck is supple without bruit. Hearing is normal. Cardiac exam no murmur or gallop. Lungs are clear to auscultation. Distal pulses are well felt. Neurological Exam : Awake alert and interactive. Follows simple midline and one-step commands well. Pupils equal reactive. Vision acuity and feels inadequate. Extraocular moments are full range without nystagmus. Face is symmetric. Tongue is midline. Able to move both upper extremities well against gravity left and greater than right. Mild right grip and intrinsic hand muscle weakness. Diminished fine finger movements on the right. Orbits left over right upper extremity. no focal LE weakness.Plantars downgoing bilaterally.  ASSESSMENT Ms. MARICARMEN BRAZIEL is a 68 y.o. female presenting with slurred speech that progressed to global aphasia with momentary loss of consciousness and right hemirparesis. Status post IV t-PA 08/19/2013 at 0052 followed by near complete revascularization of L MCA M2-M3 branches of the  superior division of L MCA with IA tPA. MRI imaging confirms Multi focal left frontal and parietal lobes MCA infarcts with a thin post tPA asymptomatic hemorrhage along the falx without significant mass effect. Infarct embolic secondary to atrial fibrillation see on EKG.  Patient also with small vessel disease. On no antithrombotics prior to admission. Now on no antithrombotics given asymptomatic hemorrhage for secondary stroke prevention. Patient with resultant mild right hand hemiparesis. Stroke work up completed.  Acute respiratory failure, CCM following - now extubated.  Effusion stabe  COPD. Uses 02 at home  atrial fibrillation per admitting EKG, new finding. Old records indicate atrial  arrythmia. Has not been on anticoagulation Hypertension. On cardene post intervention, now off. BP stabilized  Hyperlipidemia, LDL 42, on zocor 40 PTA, now on no statin as NPO, goal LDL < 70 for diabetics Diabetes, HgbA1c 5.6, goal < 7.0 CAD - MI Hx stroke 2010, no residual deficits  ESRD, on hemodialysis Tues, Thurs and Sat, [facility]. Renal following. Has peritoneal cath x 1 yr.  Anemia post tPA 12->8.6 Hx recent GIB requiring cautery Hx ovarian cancer, 1995, [place], in remission Known post CABG L leg wound with dressing. Weekly dressing Anxiety, on xanax at home. Changed to scheduled bid dosing.  Hospital day # 5  TREATMENT/PLAN  Add aspirin 81 mg daily today  CT head in 1 week to evaluate for coumadin initiation  HH PT, OT eval pending   Discharge home later today  SIGNED Burnetta Sabin, MSN, RN, ANVP-BC, ANP-BC, GNP-BC [facility] Pager: [phone] 08/23/2013 8:28 AM  I have personally obtained a history, examined the patient, evaluated imaging results, and formulated the assessment and plan of care. I agree with the above.  Antony Contras, MD  To contact Stroke Continuity provider, please refer to [email or web address] After hours, contact General  Neurology

## 2013-08-23 NOTE — Progress Notes (Signed)
Spoke with patient who stated she was receiving HHPT/OT/RN at the time of her admission for her heart surgery in March 2015.  Will obtain orders to resume previous [facility] services. Pt denies any need for additional DME.  Pt reports services were with [facility] and she wishes to remain with them when presented with a choice. [facility] rep contacted via phone to notify of order to resume services and discharge date.

## 2013-08-23 NOTE — Progress Notes (Signed)
Admit: 08/18/2013 LOS: 5  25F ESRD [facility] via RUA AVF w/ acute ischemic CVA s/p tPA, recovered.    Subjective:  Feels well Discharge planning for home today or tomorrow No c/o or needs   05/31 0701 - 06/01 0700 In: 590 [P.O.:360; I.V.:210; NG/GT:20] Out: -   Filed Weights   08/21/13 0440 08/22/13 0400 08/23/13 0300  Weight: 75.5 kg (166 lb 7.2 oz) 73.8 kg (162 lb 11.2 oz) 73.6 kg (162 lb 4.1 oz)    Current meds: reviewed includes aranesp 150 next 6/6 and FeGluc 125 qTHS Current Labs: reviewed   Outpt HD Orders Unit: [facility]   Days: THS   Physical Exam:  Blood pressure 136/81, pulse 52, temperature 97.8 F (36.6 C), temperature source Oral, resp. rate 26, height 5\' 3"  (1.6 m), weight 73.6 kg (162 lb 4.1 oz), SpO2 100.00%. NAD RRR CTAB, nl WOB RUA AVF +B/T No LEE  Assessment/Plan 1. ESRD on HD THS: Next Tx tomorrow first shift 2. Anemia: on ESA and IV Fe 3. CVA: remarkable recovery, per neuro 4. HPTH 5. HTN, stable  Pearson Grippe MD 08/23/2013, 10:08 AM   Recent Labs Lab 08/20/13 0330  08/21/13 1930 08/22/13 0248 08/23/13 0249  NA 141  < > 140 140 138  K 5.0  < > 3.6* 3.5* 3.6*  CL 102  < > 98 98 96  CO2 22  < > 28 29 28   GLUCOSE 109*  < > 194* 174* 146*  BUN 24*  < > 23 8 21   CREATININE 5.93*  < > 3.77* 1.70* 3.26*  CALCIUM 8.2*  < > 8.4 8.1* 8.6  PHOS 5.8*  --  4.3 1.8*  --   < > = values in this interval not displayed.  Recent Labs Lab 08/19/13 0003  08/19/13 0554 08/20/13 0330 08/21/13 1930  WBC 5.9  --  4.9 6.3 5.4  NEUTROABS 4.1  --  4.1 5.0  --   HGB 10.3*  < > 8.6* 8.6* 8.9*  HCT 35.1*  < > 28.1* 27.8* 29.3*  MCV 104.8*  --  102.9* 101.5* 103.2*  PLT 257  --  212 214 196  < > = values in this interval not displayed.

## 2013-08-23 NOTE — Plan of Care (Signed)
Problem: tPA Day Progression Outcomes-Only if tPA administered Goal: Post tPA image without hemorrhage Outcome: Not Met (add Reason) Thin SDH 1m on 08/22/13 scan Goal: Post tPA no S/S of hemorrhage elsewhere Outcome: Not Met (add Reason) Thin SDH 221mon 08/22/2013  Problem: Acute Treatment Outcomes Goal: tPA Patient w/o S&S of bleeding Outcome: Not Met (add Reason) Thin SDH [id number] 08/22/2013

## 2013-08-23 NOTE — Discharge Summary (Signed)
Stroke Discharge Summary  Patient ID: [name]   MRN: [id number]      DOB: 1945-12-23  Date of Admission: 08/18/2013 Date of Discharge: 08/23/2013  Attending Physician:  Suzzanne Cloud, MD, Stroke MD  Consulting Physician(s):   Treatment Team:  Md Stroke, MD, Merrie Roof, MD (pulmonary/intensive care), Melody Liane Comber, RN (wound care), Donetta Potts, MD, Placido Sou, MD Sherril Croon, MD  and  Edrick Oh, MD (nephrology),   Patient's PCP:  Shirline Frees, MD  Discharge Diagnoses:  Principal Problem:   L MCA frontal and parietal nfarcts secondary to atrial fibrillation s/p IV and IA TPA. Active Problems:    thin post tPA asymptomatic hemorrhage without mass effec   DM   HYPERTENSION   Chronic diastolic heart failure   ESRD (end stage renal disease)   Mitral valve insufficiency   GI bleeding   Acute respiratory failure   Atrial fibrillation   Hyperlipidemia   Diabetes   COPD    Anxiety   Acute blood loss anemia   right hand hemiparesis secondary to stroke  Past Medical History  Diagnosis Date  . Diabetes mellitus   . Hypertension   . Hyperlipidemia   . Coronary artery disease   . Dialysis patient     T/Th/Sat [facility]-Dr Hinda Lenis, stops hemodialysis 11-28-2012, will do peritoneal dialysis  starting 12-01-2012   . Carpal tunnel syndrome   . Anemia   . Ovarian cancer 1995    [name]  . Colon polyps 01/2012    Per colonoscopy, Dr. Gala Romney  . CKD (chronic kidney disease)     peritoneal dialysis at home nightly.  . Transfusion history 03-04-13    Transfusion x1 unit a month ago-[facility]  . Stroke 2010    2010-"brief weakness,tongue thickness,incoherent"-no residual affects.  . Mitral regurgitation   . Myocardial infarction 2009  . Peritonitis 06/23/2013   Past Surgical History  Procedure Laterality Date  . Complete abdominal hysterectomy  1995  . Abdominal hysterectomy    . Colonoscopy with esophagogastroduodenoscopy (egd)   01/27/2012    Dr. Gala Romney. Small hiatal hernia, abnormal second portion of the duodenum with questionable extrinsic compression. Single left sided colonic diverticulum, 2 colon polyps. Hamartomatous colon polyp  . Eus  04/09/2012    Dr. Owens Loffler: Nonspecific edema/thickening of the periampullary duodenum. Unremarkable biopsy  . Tonsillectomy      child  . Peritoneal catheter insertion  10/30/2012    for peritoneal dialysis  . Back surgery  2009    x3 level fusion-Dr. Louanne Skye  . Cardiac catheterization      x2 stents-'09- Dr. Joellen Jersey  . Esophagogastroduodenoscopy (egd) with propofol N/A 03/11/2013    Dr. Owens Loffler, small duodenal diverticulum. Edematous duodenal mucosa with friability on biopsy (pathology negative)  . Three-vessel cabg, mitral valve annuloplasty, may ease  06/17/2013    [facility]  . Coronary artery bypass graft    . Esophagogastroduodenoscopy N/A 07/02/2013    SL[facility]:GI BLEED DUE TO LARGE DUODENAL ULCER/Small hiatal hernia/PYLORIC CHANNEL ULCER/Multiple CLEANED BASED ulcers in the duodenal bulb and 2nd part of the duodenum  . Givens capsule study N/A 07/02/2013    Procedure: GIVENS CAPSULE STUDY;  Surgeon: Danie Binder, MD;  Location: [facility];  Service: Endoscopy;  Laterality: N/A;  possible deployment in EGD negative      Medication List         ALPRAZolam 0.5 MG tablet  Commonly known as:  XANAX  Take 0.5 mg  by mouth at bedtime as needed for anxiety.     carvedilol 3.125 MG tablet  Commonly known as:  COREG  Take 3.125 mg by mouth 2 (two) times daily with a meal.     EPINEPHrine 0.3 mg/0.3 mL Soaj injection  Commonly known as:  EPI-PEN  Inject 0.3 mg into the muscle daily as needed (allergic reaction).     HUMULIN 70/30 [place]  - Inject 10-20 Units into the skin 2 (two) times daily. 20 units in the morning    - 10 units at night     meclizine 25 MG tablet  Commonly known as:  ANTIVERT  Take 50 mg by mouth 2 (two) times daily as needed for  dizziness.     multivitamin Tabs tablet  Take 1 tablet by mouth every morning.     pantoprazole 40 MG tablet  Commonly known as:  PROTONIX  Take 1 tablet (40 mg total) by mouth 2 (two) times daily.     simvastatin 40 MG tablet  Commonly known as:  ZOCOR  Take 40 mg by mouth at bedtime.        LABORATORY STUDIES CBC    Component Value Date/Time   WBC 5.4 08/21/2013 1930   RBC 2.84* 08/21/2013 1930   RBC 2.25* 01/10/2013 1437   HGB 8.9* 08/21/2013 1930   HCT 29.3* 08/21/2013 1930   PLT 196 08/21/2013 1930   MCV 103.2* 08/21/2013 1930   MCH 31.3 08/21/2013 1930   MCHC 30.4 08/21/2013 1930   RDW 19.0* 08/21/2013 1930   LYMPHSABS 0.8 08/20/2013 0330   MONOABS 0.4 08/20/2013 0330   EOSABS 0.0 08/20/2013 0330   BASOSABS 0.0 08/20/2013 0330   CMP    Component Value Date/Time   NA 138 08/23/2013 0249   K 3.6* 08/23/2013 0249   CL 96 08/23/2013 0249   CO2 28 08/23/2013 0249   GLUCOSE 146* 08/23/2013 0249   BUN 21 08/23/2013 0249   BUN 42* 01/02/2012 0919   CREATININE 3.26* 08/23/2013 0249   CREATININE 5.80 01/02/2012 0919   CALCIUM 8.6 08/23/2013 0249   CALCIUM 8.5 01/12/2007 0545   PROT 6.2 08/21/2013 1930   ALBUMIN 2.2* 08/22/2013 0248   AST 11 08/21/2013 1930   ALT <5 08/21/2013 1930   ALKPHOS 69 08/21/2013 1930   BILITOT 0.4 08/21/2013 1930   GFRNONAA 13* 08/23/2013 0249   GFRAA 16* 08/23/2013 0249   COAGS Lab Results  Component Value Date   INR 1.21 08/20/2013   INR 1.09 08/19/2013   INR 1.26 07/01/2013   Lipid Panel    Component Value Date/Time   CHOL 109 08/19/2013 0554   TRIG 79 08/19/2013 0554   HDL 51 08/19/2013 0554   CHOLHDL 2.1 08/19/2013 0554   VLDL 16 08/19/2013 0554   LDLCALC 42 08/19/2013 0554   HgbA1C  Lab Results  Component Value Date   HGBA1C 6.7* 08/19/2013   Cardiac Panel (last 3 results) No results found for this basename: CKTOTAL, CKMB, TROPONINI, RELINDX,  in the last 72 hours Urinalysis    Component Value Date/Time   COLORURINE YELLOW 01/10/2013 1102   APPEARANCEUR  CLOUDY* 01/10/2013 1102   LABSPEC 1.020 01/10/2013 1102   PHURINE 7.0 01/10/2013 1102   GLUCOSEU NEGATIVE 01/10/2013 1102   HGBUR SMALL* 01/10/2013 1102   [place] 01/10/2013 [place] 01/10/2013 1102   PROTEINUR 100* 01/10/2013 1102   UROBILINOGEN 0.2 01/10/2013 1102   NITRITE POSITIVE* 01/10/2013 1102   LEUKOCYTESUR SMALL* 01/10/2013 1102  Urine Drug Screen  No results found for this basename: labopia,  cocainscrnur,  labbenz,  amphetmu,  thcu,  labbarb    Alcohol Level No results found for this basename: eth    SIGNIFICANT DIAGNOSTIC STUDIES CT of the brain  08/22/2013 1. Persistent thin subdural hemorrhage measuring 2 mm in maximal diameter along the falx, decreased in size relative to prior CT from 08/20/2013. No mass effect. 2. Interval resolution of previously identified small left parafalcine hemorrhage within the left parietal lobe. 3. Nonvisualization of known small left MCA territory infarcts. 4. Stable chronic small vessel ischemic disease.  08/20/2013 1. No acute large vessel territory infarct identified. 2. Stable 1.2 cm hyperdensity within the left parafalcine gray matter within the left parietal lobe. Finding is stable as compared to most recent CT, but new relative to initial CT performed prior to catheter directed arteriogram and tPA administration. It is unclear whether this opacity represents a small amount of blood products or is related to recent procedure. Continued followup recommended. 3. Increased hyperdensity along the falx as compared to initial CT from 08/19/2013, with decreased hyperdensity along the left tentorium. Again, it is unclear whether this represents a small amount of subdural hemorrhage versus sequelae of recent intervention.  08/19/2013 1. New 1.2 cm hyperdensity within the left parafalcine gray matter of the left parietal lobe. Finding is concerning for possible new subarachnoid/intraparenchymal hemorrhage. 2. Increased  hyperdensity along the falx and left tentorium. While this finding may be related to recent procedure, possible acute subdural hemorrhage could also have this appearance. A close interval followup CT would likely be helpful to further evaluate these findings.  08/19/2013 1. No acute intracranial pathology seen on CT. 2. Scattered small vessel ischemic microangiopathy.  CT Angio Head 08/19/2013 1. Occlusion of a left M2 branch supplying the anterior division of the left MCA artery territory as above. 2. No other proximal branch occlusion or high-grade flow-limiting stenosis identified within the anterior circulation.  CT Angio Neck 08/19/2013 1. Atherosclerotic plaque at the mid origin of the right internal carotid artery with associated short segment stenosis of approximately 50% by NASCET criteria. 2. Atherosclerotic plaque at the origin of the left internal carotid artery with associated stenosis of approximately 30-40% by NASCET criteria. 3. No high-grade stenosis or vascular occlusion identified within the neck. 4. Moderate bilateral pleural effusions, right greater than left. 5. Patchy and ground-glass opacity within the partially visualized upper lobes, right greater than left. Findings are indeterminate, and may represent edema and/or infection. Clinical correlation with dedicated chest radiograph recommended. 6. Left supraclavicular adenopathy as above, of uncertain etiology, and may be reactive in nature.  Cerebral Angio 08/19/2013 S/P lt common carotid arteriogram followed by near complete revascularization of lt MCA M2-M 3branches of superior division of LT MCA with 15 mg of superselective IA TPA  MRI of the brain 08/21/2013 1. Multi focal acute ischemic infarcts involving the left frontal and parietal lobes in the left MCA distribution as above. No associated hemorrhage or significant mass effect. 2. Thin subdural hemorrhage along the falx without significant mass effect. This is decreased in size  relative to prior CT from 08/19/2013. 3. Mild to moderate chronic microvascular ischemic disease involving the supratentorial white matter.  2D Echocardiogram - EF 55 - 60%. No cardiac source of emboli identified.  CXR  08/20/2013 Mildly improved pulmonary edema most evident in the left mid and lower lung. Persistent small effusions. No new abnormalities. Support apparatus remains well-positioned.  08/19/2013 CHF with bibasilar effusions and  atelectasis, unable to exclude consolidation in LEFT lower lobe.  08/22/2013 - Trace bilateral pleural effusions and mild pulmonary edema. EKG atrial fibrillation, rate 76. For complete results please see formal report.      History of Present Illness  [name] is a 68 y.o. female who was last seen normal around 10:30 at night 08/18/2013 when she had some slurred speech. She subsequently resolved, but then as they're getting into bed again she had garbled speech and he decided to bring her into the emergency room. She was brought into the ER, and in the ER she was noticed to get significantly worse and became unresponsive for a brief period of time. She became globally aphasic and there is concern briefly that she was not protecting her airway, but her mental status once again improved to the point where she was not intubated.   She had open heart surgery done in early March, she had some GI bleeding done in early April which was cauterized. Husband reports no issues with bleeding since that time and her hemoglobin here is significantly higher than at discharge. She undergoes hemodialysis, but does have a peritoneal dialysis catheter in place. The need for heart surgery in March was apparently found on evaluation for kidney transplant surgery and this surgery was undertaken in anticipation of kidney transplantation.   CTA demonstrated L M2 occlusion. Patient was administered TPA. She was taken to neuro intervention where she had near complete revascularization of  L MCA M2-M3 branches of the superior division of L MCA with IA tPA. She was admitted to the neuro ICU for further evaluation and treatment   Hospital Course  MRI imaging confirms Multi focal left frontal and parietal lobes MCA infarcts with a thin post tPA asymptomatic hemorrhage along the falx without significant mass effect.  Patient also with small vessel disease. Infarct embolic secondary to new onset atrial fibrillation see on admitting EKG. On no antithrombotics prior to admission. Discharged on low dose aspirin with goal to change to warfarin once hemorrhage isodense on CT. Repeat CT requested in 1 week.    Patient with vascular risk factors of:  atrial fibrillation per admitting EKG, new finding. Old records indicate atrial arrythmia. Has not been on anticoagulation  Hypertension. On cardene post intervention for post goal 120-140, now off. BP stabilized.  Hyperlipidemia, LDL 42, on zocor 40 PTA, now on no statin as NPO, goal LDL < 70 for diabetics  Diabetes, HgbA1c 5.6, goal < 7.0  CAD - MI  Hx stroke 2010, no residual deficits   Patient also with:  Acute respiratory failure, intubated for procedure - now extubated.  Chest effusion stabe  COPD. Uses 02 at home  Anxiety, on xanax at home. Changed to scheduled bid dosing in hospital. Will discharge on home schedule.   Known post CABG L leg wound with dressing. Weekly dressing   Anemia post tPA 12->8.9  Hx recent GIB requiring cautery   ESRD, on hemodialysis Tues, Thurs and Sat, [facility]. Renal following. Has peritoneal cath x 1 yr.    Patient with resultant mild right hand hemiparesis.  Physical therapy, occupational therapy and speech therapy evaluated patient. They recommend continuation of home health PT & OT, which has been ordered to be resumed.  Discharge Exam  Blood pressure 129/76, pulse 74, temperature 97.8 F (36.6 C), temperature source Oral, resp. rate 22, height 5\' 3"  (1.6 m), weight 73.6 kg (162 lb 4.1  oz), SpO2 100.00%. Middle aged 16 lady intubated. .Awake alert.  Afebrile. Head is nontraumatic. Neck is supple without bruit. Hearing is normal. Cardiac exam no murmur or gallop. Lungs are clear to auscultation. Distal pulses are well felt.  Neurological Exam : Awake alert and interactive. Follows simple midline and one-step commands well. Pupils equal reactive. Vision acuity and feels inadequate. Extraocular moments are full range without nystagmus. Face is symmetric. Tongue is midline. Able to move both upper extremities well against gravity left and greater than right. Mild right grip and intrinsic hand muscle weakness. Diminished fine finger movements on the right. Orbits left over right upper extremity. no focal LE weakness.Plantars downgoing bilaterally.   Discharge Diet   Carb Control thin liquids  Discharge Plan    Disposition:  Home with husband  Continue OP dialysis tomorrow   aspirin 81 mg orally every day for secondary stroke prevention.  CT head in 1 week to assess for readiness to start warfarin   Continue HH OT and PT at discharge  Ongoing risk factor control by Primary Care Physician.  Follow-up Shirline Frees, MD in 2 weeks.  Follow-up with Dr. Antony Contras, Stroke Clinic in 2 months.  50 minutes were spent preparing discharge.  Signed Burnetta Sabin, MSN, RN, ANVP-BC, ANP-BC, GNP-BC [facility] Pager: [phone] 08/23/2013 11:40 AM  I have personally examined this patient, reviewed pertinent data and developed the plan of care. I agree with above. Antony Contras, MD

## 2013-08-23 NOTE — Progress Notes (Signed)
OT NOTE  Pt currently with [facility]. Will need to continue with HHOT after D/C.  [facility], OTR/L  [phone] 08/23/2013

## 2013-08-23 NOTE — Progress Notes (Signed)
Pt and husband given and explained discharge papers. All questions and concerned answered. Pt wheeled out via wheelchair.

## 2013-09-03 ENCOUNTER — Other Ambulatory Visit: Payer: Self-pay

## 2013-09-03 MED ORDER — PANTOPRAZOLE SODIUM 40 MG PO TBEC: 40.0000 mg | DELAYED_RELEASE_TABLET | Freq: Two times a day (BID) | ORAL | Status: DC

## 2013-09-06 ENCOUNTER — Other Ambulatory Visit: Payer: Self-pay | Admitting: Family Medicine

## 2013-09-06 DIAGNOSIS — Z09 Encounter for follow-up examination after completed treatment for conditions other than malignant neoplasm: Secondary | ICD-10-CM

## 2013-09-06 DIAGNOSIS — I639 Cerebral infarction, unspecified: Secondary | ICD-10-CM

## 2013-09-10 ENCOUNTER — Inpatient Hospital Stay
Admission: RE | Admit: 2013-09-10 | Discharge: 2013-09-10 | Disposition: A | Discharge status: Home / Self Care | Payer: Medicare Other | Source: Ambulatory Visit | Attending: Family Medicine | Admitting: Family Medicine

## 2013-09-16 ENCOUNTER — Ambulatory Visit: Payer: Medicare Other | Admitting: Gastroenterology

## 2013-09-16 ENCOUNTER — Telehealth: Payer: Self-pay | Admitting: Gastroenterology

## 2013-09-16 NOTE — Telephone Encounter (Signed)
No show X 2. In need for f/u EGD. Noncompliance issue.  Please send a do not neglect your health letter.

## 2013-09-16 NOTE — Telephone Encounter (Signed)
Mailed letter °

## 2013-09-16 NOTE — Telephone Encounter (Signed)
Pt was a no show

## 2013-09-28 ENCOUNTER — Ambulatory Visit: Payer: Medicare Other | Admitting: Physician Assistant

## 2013-09-30 ENCOUNTER — Encounter: Payer: Self-pay | Admitting: Physician Assistant

## 2013-10-07 ENCOUNTER — Other Ambulatory Visit (HOSPITAL_COMMUNITY): Payer: Self-pay | Admitting: Interventional Radiology

## 2013-10-07 ENCOUNTER — Telehealth (HOSPITAL_COMMUNITY): Payer: Self-pay | Admitting: Interventional Radiology

## 2013-10-07 DIAGNOSIS — I639 Cerebral infarction, unspecified: Secondary | ICD-10-CM

## 2013-10-07 NOTE — Telephone Encounter (Signed)
Called pt to schedule her 6 wk f/u MRI study after her stroke. She states she "just cannot do an MRI" and would like to know if we can do any other type of test. I told her I would ask Deveshwar and call her back. She is in agreement with that. JM

## 2013-10-20 ENCOUNTER — Other Ambulatory Visit (HOSPITAL_COMMUNITY): Payer: Self-pay | Admitting: Interventional Radiology

## 2013-10-20 DIAGNOSIS — I69969 Other paralytic syndrome following unspecified cerebrovascular disease affecting unspecified side: Secondary | ICD-10-CM

## 2013-10-20 DIAGNOSIS — Z789 Other specified health status: Secondary | ICD-10-CM

## 2013-10-20 DIAGNOSIS — I6340 Cerebral infarction due to embolism of unspecified cerebral artery: Secondary | ICD-10-CM

## 2013-10-20 DIAGNOSIS — I69993 Ataxia following unspecified cerebrovascular disease: Secondary | ICD-10-CM

## 2013-10-20 DIAGNOSIS — R269 Unspecified abnormalities of gait and mobility: Secondary | ICD-10-CM

## 2013-10-20 DIAGNOSIS — R4701 Aphasia: Secondary | ICD-10-CM

## 2013-10-20 DIAGNOSIS — I639 Cerebral infarction, unspecified: Secondary | ICD-10-CM

## 2013-10-20 DIAGNOSIS — IMO0002 Reserved for concepts with insufficient information to code with codable children: Secondary | ICD-10-CM

## 2013-10-20 DIAGNOSIS — I609 Nontraumatic subarachnoid hemorrhage, unspecified: Secondary | ICD-10-CM

## 2013-10-20 DIAGNOSIS — I6350 Cerebral infarction due to unspecified occlusion or stenosis of unspecified cerebral artery: Secondary | ICD-10-CM

## 2013-10-20 DIAGNOSIS — I69398 Other sequelae of cerebral infarction: Secondary | ICD-10-CM

## 2013-10-20 DIAGNOSIS — I629 Nontraumatic intracranial hemorrhage, unspecified: Secondary | ICD-10-CM

## 2013-10-23 ENCOUNTER — Inpatient Hospital Stay (HOSPITAL_COMMUNITY)
Admission: EM | Admit: 2013-10-23 | Discharge: 2013-10-29 | DRG: 157 | Disposition: A | Discharge status: Home / Self Care | Payer: Medicare Other | Attending: Internal Medicine | Admitting: Internal Medicine

## 2013-10-23 ENCOUNTER — Encounter (HOSPITAL_COMMUNITY): Payer: Self-pay | Admitting: Emergency Medicine

## 2013-10-23 DIAGNOSIS — Z9981 Dependence on supplemental oxygen: Secondary | ICD-10-CM

## 2013-10-23 DIAGNOSIS — D696 Thrombocytopenia, unspecified: Secondary | ICD-10-CM | POA: Diagnosis present

## 2013-10-23 DIAGNOSIS — E785 Hyperlipidemia, unspecified: Secondary | ICD-10-CM

## 2013-10-23 DIAGNOSIS — I10 Essential (primary) hypertension: Secondary | ICD-10-CM

## 2013-10-23 DIAGNOSIS — E876 Hypokalemia: Secondary | ICD-10-CM

## 2013-10-23 DIAGNOSIS — N186 End stage renal disease: Secondary | ICD-10-CM | POA: Diagnosis present

## 2013-10-23 DIAGNOSIS — R197 Diarrhea, unspecified: Secondary | ICD-10-CM | POA: Diagnosis present

## 2013-10-23 DIAGNOSIS — B370 Candidal stomatitis: Principal | ICD-10-CM | POA: Diagnosis present

## 2013-10-23 DIAGNOSIS — I059 Rheumatic mitral valve disease, unspecified: Secondary | ICD-10-CM | POA: Diagnosis present

## 2013-10-23 DIAGNOSIS — I2510 Atherosclerotic heart disease of native coronary artery without angina pectoris: Secondary | ICD-10-CM | POA: Diagnosis present

## 2013-10-23 DIAGNOSIS — N058 Unspecified nephritic syndrome with other morphologic changes: Secondary | ICD-10-CM | POA: Diagnosis present

## 2013-10-23 DIAGNOSIS — D649 Anemia, unspecified: Secondary | ICD-10-CM | POA: Diagnosis present

## 2013-10-23 DIAGNOSIS — E1129 Type 2 diabetes mellitus with other diabetic kidney complication: Secondary | ICD-10-CM

## 2013-10-23 DIAGNOSIS — Z992 Dependence on renal dialysis: Secondary | ICD-10-CM

## 2013-10-23 DIAGNOSIS — Z87891 Personal history of nicotine dependence: Secondary | ICD-10-CM

## 2013-10-23 DIAGNOSIS — Z951 Presence of aortocoronary bypass graft: Secondary | ICD-10-CM

## 2013-10-23 DIAGNOSIS — I252 Old myocardial infarction: Secondary | ICD-10-CM

## 2013-10-23 DIAGNOSIS — Z8673 Personal history of transient ischemic attack (TIA), and cerebral infarction without residual deficits: Secondary | ICD-10-CM

## 2013-10-23 DIAGNOSIS — Z794 Long term (current) use of insulin: Secondary | ICD-10-CM

## 2013-10-23 DIAGNOSIS — R112 Nausea with vomiting, unspecified: Secondary | ICD-10-CM

## 2013-10-23 DIAGNOSIS — E1165 Type 2 diabetes mellitus with hyperglycemia: Secondary | ICD-10-CM | POA: Diagnosis present

## 2013-10-23 DIAGNOSIS — Z888 Allergy status to other drugs, medicaments and biological substances status: Secondary | ICD-10-CM

## 2013-10-23 DIAGNOSIS — Z8543 Personal history of malignant neoplasm of ovary: Secondary | ICD-10-CM

## 2013-10-23 DIAGNOSIS — R627 Adult failure to thrive: Secondary | ICD-10-CM | POA: Diagnosis present

## 2013-10-23 DIAGNOSIS — D72819 Decreased white blood cell count, unspecified: Secondary | ICD-10-CM | POA: Diagnosis present

## 2013-10-23 DIAGNOSIS — Z79899 Other long term (current) drug therapy: Secondary | ICD-10-CM

## 2013-10-23 DIAGNOSIS — I120 Hypertensive chronic kidney disease with stage 5 chronic kidney disease or end stage renal disease: Secondary | ICD-10-CM | POA: Diagnosis present

## 2013-10-23 DIAGNOSIS — R630 Anorexia: Secondary | ICD-10-CM | POA: Diagnosis present

## 2013-10-23 DIAGNOSIS — J811 Chronic pulmonary edema: Secondary | ICD-10-CM | POA: Diagnosis present

## 2013-10-23 DIAGNOSIS — F411 Generalized anxiety disorder: Secondary | ICD-10-CM | POA: Diagnosis present

## 2013-10-23 DIAGNOSIS — I509 Heart failure, unspecified: Secondary | ICD-10-CM | POA: Diagnosis present

## 2013-10-23 DIAGNOSIS — I5032 Chronic diastolic (congestive) heart failure: Secondary | ICD-10-CM | POA: Diagnosis present

## 2013-10-23 DIAGNOSIS — N19 Unspecified kidney failure: Secondary | ICD-10-CM | POA: Diagnosis present

## 2013-10-23 DIAGNOSIS — R531 Weakness: Secondary | ICD-10-CM

## 2013-10-23 HISTORY — DX: End stage renal disease: N18.6

## 2013-10-23 LAB — CBC WITH DIFFERENTIAL/PLATELET
BASOS ABS: 0 10*3/uL (ref 0.0–0.1)
Basophils Relative: 0 % (ref 0–1)
Eosinophils Absolute: 0 10*3/uL (ref 0.0–0.7)
Eosinophils Relative: 1 % (ref 0–5)
HEMATOCRIT: 37.4 % (ref 36.0–46.0)
HEMOGLOBIN: 11.6 g/dL — AB (ref 12.0–15.0)
LYMPHS ABS: 0.6 10*3/uL — AB (ref 0.7–4.0)
Lymphocytes Relative: 12 % (ref 12–46)
MCH: 30.3 pg (ref 26.0–34.0)
MCHC: 31 g/dL (ref 30.0–36.0)
MCV: 97.7 fL (ref 78.0–100.0)
MONO ABS: 0.4 10*3/uL (ref 0.1–1.0)
MONOS PCT: 7 % (ref 3–12)
NEUTROS ABS: 4.4 10*3/uL (ref 1.7–7.7)
Neutrophils Relative %: 80 % — ABNORMAL HIGH (ref 43–77)
Platelets: 179 10*3/uL (ref 150–400)
RBC: 3.83 MIL/uL — AB (ref 3.87–5.11)
RDW: 17.1 % — ABNORMAL HIGH (ref 11.5–15.5)
WBC: 5.4 10*3/uL (ref 4.0–10.5)

## 2013-10-23 LAB — COMPREHENSIVE METABOLIC PANEL
ALBUMIN: 2.3 g/dL — AB (ref 3.5–5.2)
ALT: 15 U/L (ref 0–35)
AST: 19 U/L (ref 0–37)
Alkaline Phosphatase: 86 U/L (ref 39–117)
Anion gap: 18 — ABNORMAL HIGH (ref 5–15)
BUN: 25 mg/dL — ABNORMAL HIGH (ref 6–23)
CO2: 27 mEq/L (ref 19–32)
CREATININE: 7.92 mg/dL — AB (ref 0.50–1.10)
Calcium: 8.6 mg/dL (ref 8.4–10.5)
Chloride: 92 mEq/L — ABNORMAL LOW (ref 96–112)
GFR calc Af Amer: 5 mL/min — ABNORMAL LOW (ref >90)
GFR calc non Af Amer: 5 mL/min — ABNORMAL LOW (ref >90)
Glucose, Bld: 99 mg/dL (ref 70–99)
Potassium: 2.9 mEq/L — CL (ref 3.7–5.3)
Sodium: 137 mEq/L (ref 137–147)
TOTAL PROTEIN: 7.1 g/dL (ref 6.0–8.3)
Total Bilirubin: 0.4 mg/dL (ref 0.3–1.2)

## 2013-10-23 MED ORDER — ONDANSETRON 4 MG PO TBDP
8.0000 mg | ORAL_TABLET | Freq: Once | ORAL | Status: AC
  Administered 2013-10-24: 8 mg via ORAL
  Filled 2013-10-23: qty 2

## 2013-10-23 MED ORDER — POTASSIUM CHLORIDE 10 MEQ/100ML IV SOLN: 10.0000 meq | INTRAVENOUS | Status: DC

## 2013-10-23 MED ORDER — ONDANSETRON HCL 4 MG/2ML IJ SOLN: 4.0000 mg | Freq: Once | INTRAMUSCULAR | Status: DC

## 2013-10-23 MED ORDER — POTASSIUM CHLORIDE CRYS ER 20 MEQ PO TBCR
40.0000 meq | EXTENDED_RELEASE_TABLET | Freq: Once | ORAL | Status: AC
  Administered 2013-10-24: 40 meq via ORAL
  Filled 2013-10-23: qty 2

## 2013-10-23 NOTE — ED Notes (Signed)
Patient here with complaint of loss of appetite, nausea, and diarrhea. Loss of appetite has been ongoing since open heart surgery in March. Diarrhea started about a month ago. Nausea has been ongoing for about a week. Patient started a medication to help with appetite on Tuesday of this week.

## 2013-10-23 NOTE — ED Notes (Addendum)
Dr. Sharol Given at Bedside.

## 2013-10-24 ENCOUNTER — Inpatient Hospital Stay (HOSPITAL_COMMUNITY): Payer: Medicare Other

## 2013-10-24 ENCOUNTER — Emergency Department (HOSPITAL_COMMUNITY): Payer: Medicare Other

## 2013-10-24 ENCOUNTER — Encounter (HOSPITAL_COMMUNITY): Payer: Self-pay | Admitting: Nephrology

## 2013-10-24 DIAGNOSIS — Z79899 Other long term (current) drug therapy: Secondary | ICD-10-CM | POA: Diagnosis not present

## 2013-10-24 DIAGNOSIS — J811 Chronic pulmonary edema: Secondary | ICD-10-CM | POA: Diagnosis present

## 2013-10-24 DIAGNOSIS — R630 Anorexia: Secondary | ICD-10-CM | POA: Diagnosis present

## 2013-10-24 DIAGNOSIS — E785 Hyperlipidemia, unspecified: Secondary | ICD-10-CM | POA: Diagnosis present

## 2013-10-24 DIAGNOSIS — R5383 Other fatigue: Secondary | ICD-10-CM

## 2013-10-24 DIAGNOSIS — I2510 Atherosclerotic heart disease of native coronary artery without angina pectoris: Secondary | ICD-10-CM | POA: Diagnosis present

## 2013-10-24 DIAGNOSIS — Z8673 Personal history of transient ischemic attack (TIA), and cerebral infarction without residual deficits: Secondary | ICD-10-CM | POA: Diagnosis not present

## 2013-10-24 DIAGNOSIS — N058 Unspecified nephritic syndrome with other morphologic changes: Secondary | ICD-10-CM | POA: Diagnosis present

## 2013-10-24 DIAGNOSIS — I10 Essential (primary) hypertension: Secondary | ICD-10-CM

## 2013-10-24 DIAGNOSIS — I5032 Chronic diastolic (congestive) heart failure: Secondary | ICD-10-CM

## 2013-10-24 DIAGNOSIS — F411 Generalized anxiety disorder: Secondary | ICD-10-CM | POA: Diagnosis present

## 2013-10-24 DIAGNOSIS — R627 Adult failure to thrive: Secondary | ICD-10-CM | POA: Diagnosis present

## 2013-10-24 DIAGNOSIS — R5381 Other malaise: Secondary | ICD-10-CM

## 2013-10-24 DIAGNOSIS — Z794 Long term (current) use of insulin: Secondary | ICD-10-CM | POA: Diagnosis not present

## 2013-10-24 DIAGNOSIS — R197 Diarrhea, unspecified: Secondary | ICD-10-CM | POA: Diagnosis present

## 2013-10-24 DIAGNOSIS — I059 Rheumatic mitral valve disease, unspecified: Secondary | ICD-10-CM | POA: Diagnosis present

## 2013-10-24 DIAGNOSIS — E876 Hypokalemia: Secondary | ICD-10-CM

## 2013-10-24 DIAGNOSIS — E1129 Type 2 diabetes mellitus with other diabetic kidney complication: Secondary | ICD-10-CM | POA: Diagnosis present

## 2013-10-24 DIAGNOSIS — N186 End stage renal disease: Secondary | ICD-10-CM | POA: Diagnosis present

## 2013-10-24 DIAGNOSIS — R112 Nausea with vomiting, unspecified: Secondary | ICD-10-CM

## 2013-10-24 DIAGNOSIS — I120 Hypertensive chronic kidney disease with stage 5 chronic kidney disease or end stage renal disease: Secondary | ICD-10-CM | POA: Diagnosis present

## 2013-10-24 DIAGNOSIS — I509 Heart failure, unspecified: Secondary | ICD-10-CM | POA: Diagnosis present

## 2013-10-24 DIAGNOSIS — I252 Old myocardial infarction: Secondary | ICD-10-CM | POA: Diagnosis not present

## 2013-10-24 DIAGNOSIS — E1165 Type 2 diabetes mellitus with hyperglycemia: Secondary | ICD-10-CM

## 2013-10-24 DIAGNOSIS — Z888 Allergy status to other drugs, medicaments and biological substances status: Secondary | ICD-10-CM | POA: Diagnosis not present

## 2013-10-24 DIAGNOSIS — Z87891 Personal history of nicotine dependence: Secondary | ICD-10-CM | POA: Diagnosis not present

## 2013-10-24 DIAGNOSIS — Z9981 Dependence on supplemental oxygen: Secondary | ICD-10-CM | POA: Diagnosis not present

## 2013-10-24 DIAGNOSIS — Z951 Presence of aortocoronary bypass graft: Secondary | ICD-10-CM | POA: Diagnosis not present

## 2013-10-24 DIAGNOSIS — N19 Unspecified kidney failure: Secondary | ICD-10-CM | POA: Diagnosis present

## 2013-10-24 DIAGNOSIS — D696 Thrombocytopenia, unspecified: Secondary | ICD-10-CM | POA: Diagnosis present

## 2013-10-24 DIAGNOSIS — D649 Anemia, unspecified: Secondary | ICD-10-CM | POA: Diagnosis present

## 2013-10-24 DIAGNOSIS — Z992 Dependence on renal dialysis: Secondary | ICD-10-CM

## 2013-10-24 DIAGNOSIS — Z8543 Personal history of malignant neoplasm of ovary: Secondary | ICD-10-CM | POA: Diagnosis not present

## 2013-10-24 DIAGNOSIS — D72819 Decreased white blood cell count, unspecified: Secondary | ICD-10-CM | POA: Diagnosis present

## 2013-10-24 DIAGNOSIS — B370 Candidal stomatitis: Secondary | ICD-10-CM | POA: Diagnosis present

## 2013-10-24 LAB — MAGNESIUM: Magnesium: 1.5 mg/dL (ref 1.5–2.5)

## 2013-10-24 LAB — BASIC METABOLIC PANEL
Anion gap: 16 — ABNORMAL HIGH (ref 5–15)
BUN: 27 mg/dL — ABNORMAL HIGH (ref 6–23)
CHLORIDE: 94 meq/L — AB (ref 96–112)
CO2: 27 mEq/L (ref 19–32)
CREATININE: 8.6 mg/dL — AB (ref 0.50–1.10)
Calcium: 8.2 mg/dL — ABNORMAL LOW (ref 8.4–10.5)
GFR calc Af Amer: 5 mL/min — ABNORMAL LOW (ref >90)
GFR calc non Af Amer: 4 mL/min — ABNORMAL LOW (ref >90)
Glucose, Bld: 99 mg/dL (ref 70–99)
Potassium: 3.6 mEq/L — ABNORMAL LOW (ref 3.7–5.3)
Sodium: 137 mEq/L (ref 137–147)

## 2013-10-24 LAB — CBC
HCT: 34.4 % — ABNORMAL LOW (ref 36.0–46.0)
HEMOGLOBIN: 10.5 g/dL — AB (ref 12.0–15.0)
MCH: 29.3 pg (ref 26.0–34.0)
MCHC: 30.5 g/dL (ref 30.0–36.0)
MCV: 96.1 fL (ref 78.0–100.0)
Platelets: 180 10*3/uL (ref 150–400)
RBC: 3.58 MIL/uL — ABNORMAL LOW (ref 3.87–5.11)
RDW: 17.4 % — AB (ref 11.5–15.5)
WBC: 5.9 10*3/uL (ref 4.0–10.5)

## 2013-10-24 LAB — HEMOGLOBIN A1C
Hgb A1c MFr Bld: 7.3 % — ABNORMAL HIGH (ref <5.7)
MEAN PLASMA GLUCOSE: 163 mg/dL — AB (ref <117)

## 2013-10-24 LAB — GLUCOSE, CAPILLARY
GLUCOSE-CAPILLARY: 265 mg/dL — AB (ref 70–99)
Glucose-Capillary: 97 mg/dL (ref 70–99)
Glucose-Capillary: 211 mg/dL — ABNORMAL HIGH (ref 70–99)
Glucose-Capillary: 232 mg/dL — ABNORMAL HIGH (ref 70–99)
Glucose-Capillary: 130 mg/dL — ABNORMAL HIGH (ref 70–99)

## 2013-10-24 MED ORDER — SODIUM CHLORIDE 0.9 % IV SOLN: 250.0000 mL | INTRAVENOUS | Status: DC | PRN

## 2013-10-24 MED ORDER — OXYCODONE HCL 5 MG PO TABS: 5.0000 mg | ORAL_TABLET | ORAL | Status: DC | PRN

## 2013-10-24 MED ORDER — ALUM & MAG HYDROXIDE-SIMETH 200-200-20 MG/5ML PO SUSP
30.0000 mL | Freq: Four times a day (QID) | ORAL | Status: DC | PRN
  Administered 2013-10-24: 30 mL via ORAL
  Filled 2013-10-24: qty 30

## 2013-10-24 MED ORDER — PANTOPRAZOLE SODIUM 40 MG PO TBEC
40.0000 mg | DELAYED_RELEASE_TABLET | Freq: Two times a day (BID) | ORAL | Status: DC
  Administered 2013-10-24 – 2013-10-29 (×11): 40 mg via ORAL
  Filled 2013-10-24 (×10): qty 1

## 2013-10-24 MED ORDER — ACETAMINOPHEN 325 MG PO TABS: 650.0000 mg | ORAL_TABLET | Freq: Four times a day (QID) | ORAL | Status: DC | PRN

## 2013-10-24 MED ORDER — ONDANSETRON HCL 4 MG/2ML IJ SOLN
4.0000 mg | Freq: Four times a day (QID) | INTRAMUSCULAR | Status: DC | PRN
  Administered 2013-10-25: 4 mg via INTRAVENOUS
  Filled 2013-10-24: qty 2

## 2013-10-24 MED ORDER — HYDROMORPHONE HCL PF 1 MG/ML IJ SOLN: 0.5000 mg | INTRAMUSCULAR | Status: DC | PRN

## 2013-10-24 MED ORDER — ENOXAPARIN SODIUM 30 MG/0.3ML ~~LOC~~ SOLN
30.0000 mg | SUBCUTANEOUS | Status: DC
  Administered 2013-10-24 – 2013-10-28 (×5): 30 mg via SUBCUTANEOUS
  Filled 2013-10-24 (×6): qty 0.3

## 2013-10-24 MED ORDER — INSULIN ASPART 100 UNIT/ML ~~LOC~~ SOLN
0.0000 [IU] | SUBCUTANEOUS | Status: DC
  Administered 2013-10-24: 5 [IU] via SUBCUTANEOUS
  Administered 2013-10-24: 3 [IU] via SUBCUTANEOUS
  Administered 2013-10-25: 5 [IU] via SUBCUTANEOUS

## 2013-10-24 MED ORDER — ALPRAZOLAM 0.5 MG PO TABS: 0.5000 mg | ORAL_TABLET | Freq: Every evening | ORAL | Status: DC | PRN

## 2013-10-24 MED ORDER — SODIUM CHLORIDE 0.9 % IJ SOLN: 3.0000 mL | INTRAMUSCULAR | Status: DC | PRN

## 2013-10-24 MED ORDER — ACETAMINOPHEN 650 MG RE SUPP: 650.0000 mg | Freq: Four times a day (QID) | RECTAL | Status: DC | PRN

## 2013-10-24 MED ORDER — SODIUM CHLORIDE 0.9 % IV SOLN
Freq: Once | INTRAVENOUS | Status: AC
  Administered 2013-10-24: 02:00:00 via INTRAVENOUS

## 2013-10-24 MED ORDER — MAGNESIUM SULFATE 40 MG/ML IJ SOLN
2.0000 g | Freq: Once | INTRAMUSCULAR | Status: AC
  Administered 2013-10-24: 2 g via INTRAVENOUS
  Filled 2013-10-24 (×2): qty 50

## 2013-10-24 MED ORDER — MECLIZINE HCL 25 MG PO TABS
50.0000 mg | ORAL_TABLET | Freq: Two times a day (BID) | ORAL | Status: DC | PRN
  Filled 2013-10-24: qty 2

## 2013-10-24 MED ORDER — IOHEXOL 300 MG/ML  SOLN
25.0000 mL | INTRAMUSCULAR | Status: AC
  Administered 2013-10-24 (×2): 25 mL via ORAL

## 2013-10-24 MED ORDER — SODIUM CHLORIDE 0.9 % IJ SOLN
3.0000 mL | Freq: Two times a day (BID) | INTRAMUSCULAR | Status: DC
  Administered 2013-10-25 – 2013-10-29 (×9): 3 mL via INTRAVENOUS

## 2013-10-24 MED ORDER — RENA-VITE PO TABS
1.0000 | ORAL_TABLET | ORAL | Status: DC
  Administered 2013-10-24: 15:00:00 via ORAL
  Administered 2013-10-25 – 2013-10-29 (×5): 1 via ORAL
  Filled 2013-10-24 (×8): qty 1

## 2013-10-24 MED ORDER — SODIUM CHLORIDE 0.9 % IJ SOLN
3.0000 mL | Freq: Two times a day (BID) | INTRAMUSCULAR | Status: DC
Start: 2013-10-24 — End: 2013-10-25
  Administered 2013-10-24: 3 mL via INTRAVENOUS

## 2013-10-24 MED ORDER — CARVEDILOL 3.125 MG PO TABS
3.1250 mg | ORAL_TABLET | Freq: Two times a day (BID) | ORAL | Status: DC
  Administered 2013-10-24 – 2013-10-29 (×5): 3.125 mg via ORAL
  Filled 2013-10-24 (×13): qty 1

## 2013-10-24 MED ORDER — ONDANSETRON HCL 4 MG PO TABS: 4.0000 mg | ORAL_TABLET | Freq: Four times a day (QID) | ORAL | Status: DC | PRN

## 2013-10-24 MED ORDER — POTASSIUM CHLORIDE 10 MEQ/100ML IV SOLN
10.0000 meq | INTRAVENOUS | Status: AC
  Administered 2013-10-24 (×3): 10 meq via INTRAVENOUS
  Filled 2013-10-24 (×3): qty 100

## 2013-10-24 NOTE — H&P (Signed)
[facility] Admission History and Physical       [name] GBT:[id number] DOB: 18-Sep-1945 DOA: 10/23/2013  Referring physician:  EDP PCP: Shirline Frees, MD  Specialists:   Chief Complaint: Nausea, Diarrhea and Weakness  HPI: [name] is a 68 y.o. female with a history of ESRD on PD, CAD, DM2, HTN who presents tot he ED with complaints of nausea and diarrhea x 1 month and worse over the past 5 days.   She denies any fevers or chills.  She does report having indigestion like symptoms and heartburn.    She reports having a poor appetite as well.    Review of Systems:  Constitutional: No Weight Loss, No Weight Gain, Night Sweats, Fevers, Chills, Dizziness, Fatigue, or Generalized Weakness HEENT: No Headaches, Difficulty Swallowing,Tooth/Dental Problems,Sore Throat,  No Sneezing, Rhinitis, Ear Ache, Nasal Congestion, or Post Nasal Drip,  Cardio-vascular:  No Chest pain, Orthopnea, PND, Edema in Lower Extremities, Anasarca, Dizziness, Palpitations  Resp: No Dyspnea, No DOE, No Cough, No Hemoptysis, No Wheezing.    GI: +Heartburn, +Indigestion, Abdominal Pain, +Nausea, Vomiting, +Diarrhea, Hematemesis, Hematochezia, Melena, Change in Bowel Habits,  Loss of Appetite  GU: No Dysuria, Change in Color of Urine, No Urgency or Frequency, No Flank pain.  Musculoskeletal: No Joint Pain or Swelling, No Decreased Range of Motion, No Back Pain.  Neurologic: No Syncope, No Seizures, Muscle Weakness, Paresthesia, Vision Disturbance or Loss, No Diplopia, No Vertigo, No Difficulty Walking,  Skin: No Rash or Lesions. Psych: No Change in Mood or Affect, No Depression or Anxiety, No Memory loss, No Confusion, or Hallucinations   Past Medical History  Diagnosis Date  . Diabetes mellitus   . Hypertension   . Hyperlipidemia   . Coronary artery disease   . Dialysis patient     T/Th/Sat [facility]-Dr Hinda Lenis, stops hemodialysis 11-28-2012, will do peritoneal dialysis  starting 12-01-2012   .  Carpal tunnel syndrome   . Anemia   . Ovarian cancer 1995    [name]  . Colon polyps 01/2012    Per colonoscopy, Dr. Gala Romney  . CKD (chronic kidney disease)     peritoneal dialysis at home nightly.  . Transfusion history 03-04-13    Transfusion x1 unit a month ago-[facility]  . Stroke 2010    2010-"brief weakness,tongue thickness,incoherent"-no residual affects.  . Mitral regurgitation   . Myocardial infarction 2009  . Peritonitis 06/23/2013     Past Surgical History  Procedure Laterality Date  . Complete abdominal hysterectomy  1995  . Abdominal hysterectomy    . Colonoscopy with esophagogastroduodenoscopy (egd)  01/27/2012    Dr. Gala Romney. Small hiatal hernia, abnormal second portion of the duodenum with questionable extrinsic compression. Single left sided colonic diverticulum, 2 colon polyps. Hamartomatous colon polyp  . Eus  04/09/2012    Dr. Owens Loffler: Nonspecific edema/thickening of the periampullary duodenum. Unremarkable biopsy  . Tonsillectomy      child  . Peritoneal catheter insertion  10/30/2012    for peritoneal dialysis  . Back surgery  2009    x3 level fusion-Dr. Louanne Skye  . Cardiac catheterization      x2 stents-'09- Dr. Joellen Jersey  . Esophagogastroduodenoscopy (egd) with propofol N/A 03/11/2013    Dr. Owens Loffler, small duodenal diverticulum. Edematous duodenal mucosa with friability on biopsy (pathology negative)  . Three-vessel cabg, mitral valve annuloplasty, may ease  06/17/2013    [facility]  . Coronary artery bypass graft    . Esophagogastroduodenoscopy N/A 07/02/2013    SLF:GI BLEED  DUE TO LARGE DUODENAL ULCER/Small hiatal hernia/PYLORIC CHANNEL ULCER/Multiple CLEANED BASED ulcers in the duodenal bulb and 2nd part of the duodenum  . Givens capsule study N/A 07/02/2013    Procedure: GIVENS CAPSULE STUDY;  Surgeon: Danie Binder, MD;  [facility];  Service: Endoscopy;  Laterality: N/A;  possible deployment in EGD negative    . Radiology with anesthesia N/A 08/19/2013    Procedure: RADIOLOGY WITH ANESTHESIA;  Surgeon: Rob Hickman, MD;  Location: [place];  Service: Radiology;  Laterality: N/A;      Prior to Admission medications   Medication Sig Start Date End Date Taking? Authorizing Provider  ALPRAZolam Duanne Moron) 0.5 MG tablet Take 0.5 mg by mouth at bedtime as needed for anxiety.   Yes Historical Provider, MD  carvedilol (COREG) 3.125 MG tablet Take 3.125 mg by mouth 2 (two) times daily with a meal.   Yes Historical Provider, MD  doxycycline (VIBRA-TABS) 100 MG tablet Take 100 mg by mouth 2 (two) times daily. 10/16/13 10/25/13 Yes Historical Provider, MD  insulin aspart protamine- aspart (NOVOLOG MIX 70/30) (70-30) 100 UNIT/ML injection Inject 10-20 Units into the skin See admin instructions. Take 20 units in the morning and 10 units at night.   Yes Historical Provider, MD  meclizine (ANTIVERT) 25 MG tablet Take 50 mg by mouth 2 (two) times daily as needed for dizziness.   Yes Historical Provider, MD  multivitamin (RENA-VIT) TABS tablet Take 1 tablet by mouth every morning.   Yes Historical Provider, MD  pantoprazole (PROTONIX) 40 MG tablet Take 1 tablet (40 mg total) by mouth 2 (two) times daily. 09/03/13  Yes Milus Banister, MD  EPINEPHrine 0.3 mg/0.3 mL IJ SOAJ injection Inject 0.3 mg into the muscle daily as needed (allergic reaction).    Historical Provider, MD     Allergies  Allergen Reactions  . Lisinopril Swelling    Social History:  reports that she quit smoking about 55 years ago. She has never used smokeless tobacco. She reports that she does not drink alcohol or use illicit drugs.     Family History  Problem Relation Age of Onset  . Diabetes Mother       Physical Exam:  GEN:  Pleasant Elderly  68 y.o. Caucasian female examined and in no acute distress; cooperative with exam Filed Vitals:   10/24/13 0123 10/24/13 0130 10/24/13 0200 10/24/13 0230  BP: 139/72 142/56 130/56 117/57  Pulse:  71 69 69 67  Temp:      TempSrc:      Resp: 22 25 21 18   SpO2: 98% 99% 97% 98%   Blood pressure 117/57, pulse 67, temperature 98 F (36.7 C), temperature source Oral, resp. rate 18, SpO2 98.00%. PSYCH: SHe is alert and oriented x4; does not appear anxious does not appear depressed; affect is normal HEENT: Normocephalic and Atraumatic, Mucous membranes pink; PERRLA; EOM intact; Fundi:  Benign;  No scleral icterus, Nares: Patent, Oropharynx: Clear,  Fair Dentition,    Neck:  FROM, No Cervical Lymphadenopathy nor Thyromegaly or Carotid Bruit; No JVD; Breasts:: Not examined CHEST WALL: No tenderness CHEST: Normal respiration, clear to auscultation bilaterally HEART: Regular rate and rhythm; no murmurs rubs or gallops BACK: No kyphosis or scoliosis; No CVA tenderness ABDOMEN: Positive Bowel Sounds, Soft Non-Tender; No Masses, No Organomegaly Rectal Exam: Not done EXTREMITIES: No Cyanosis, Clubbing, or Edema; No Ulcerations. Genitalia: not examined PULSES: 2+ and symmetric SKIN: Normal hydration no rash or ulceration CNS:  Alert and Oriented x 4,  No focal Deficits.   Vascular: pulses palpable throughout    Labs on Admission:  Basic Metabolic Panel:  Recent Labs Lab 10/23/13 2055  NA 137  K 2.9*  CL 92*  CO2 27  GLUCOSE 99  BUN 25*  CREATININE 7.92*  CALCIUM 8.6   Liver Function Tests:  Recent Labs Lab 10/23/13 2055  AST 19  ALT 15  ALKPHOS 86  BILITOT 0.4  PROT 7.1  ALBUMIN 2.3*   No results found for this basename: LIPASE, AMYLASE,  in the last 168 hours No results found for this basename: AMMONIA,  in the last 168 hours CBC:  Recent Labs Lab 10/23/13 2055  WBC 5.4  NEUTROABS 4.4  HGB 11.6*  HCT 37.4  MCV 97.7  PLT 179   Cardiac Enzymes: No results found for this basename: CKTOTAL, CKMB, CKMBINDEX, TROPONINI,  in the last 168 hours  BNP (last 3 results) No results found for this basename: PROBNP,  in the last 8760 hours CBG: No results found for  this basename: GLUCAP,  in the last 168 hours  Radiological Exams on Admission: Dg Chest 2 View  10/24/2013   CLINICAL DATA:  Shortness of breath.  EXAM: CHEST  2 VIEW  COMPARISON:  Portable chest x-rays 08/22/2013 dating back to 08/19/2013. Two-view chest x-ray 06/27/2013.  FINDINGS: Prior sternotomy for aortic valve replacement. Cardiac silhouette markedly enlarged but stable. Mild diffuse interstitial pulmonary edema. Bilateral pleural effusions, moderately large on the right and small on the left. Associated consolidation in the lower lobes.  IMPRESSION: Mild CHF, with stable marked cardiomegaly, mild diffuse interstitial pulmonary edema and bilateral pleural effusions. Associated passive atelectasis in the lower lobes.   Electronically Signed   By: Evangeline Dakin M.D.   On: 10/24/2013 00:59       Assessment/Plan:   68 y.o. female with   Principal Problem:   1.   Diarrhea    Send Stool for C+S, and C.diff PCR to evaluate for Infectious cause  Active Problems:   2.   Hypokalemia- Due to #1    Replacement given orally and by IV of K+   Check Magnesium level.          3.   CAD, NATIVE VESSEL   stable     4.   Chronic diastolic heart failure   Mild CHF on Chest X-ray, Resolve with Dialysis Rx     5.   ESRD (end stage renal disease)   On Peritoneal Dialysis, Renal Consulted by EDP , Dr Jonnie Finner.      6.   Type II or unspecified type diabetes mellitus with renal manifestations, uncontrolled   SSI coverage PRN,a dn check HbA1c.        7.   Peritoneal dialysis status    Same as #5.   PD per Renal Team     8.   Pulmonary edema, noncardiac   Same as #4.       9.   Appetite loss- due to Illness, #1.      Supportive Rx, monitor Sxs.          10. Nausea with vomiting   Anti-Emetics PRN.       11. DVT Prophylaxis   Lovenox     Code Status:  FULL CODE   Family Communication:    Family at bedside Disposition Plan:       Inpatient  Time spent:  [facility] Pager [phone]   If [place]  Please Contact the Day Rounding Team MD for [facility]  If 7PM-7AM, Please Contact night-coverage  [email or web address] Password [email or web address] 10/24/2013, 3:05 AM

## 2013-10-24 NOTE — ED Notes (Signed)
Patient transported to X-ray 

## 2013-10-24 NOTE — ED Notes (Signed)
Dr. Jenkins at bedside. 

## 2013-10-24 NOTE — Consult Note (Signed)
Renal Service Consult Note [name]  [name] [name] Requesting Physician:  Dr Wyline Copas  Reason for Consult:  ESRD patient with nausea and diarrhea x 1 month, worse over the past 5 days HPI: The patient is a 68 y.o. year-old with hx of HTN, DM, anemia and ESRD .  She if f/b Dr Hinda Lenis.  She has had CABG this year in March somewhere else then was admitted here in April with a large multifocal stroke treated with tPA with some mild post-tPA bleed by CT.  She has been in a SNF i believe. She presented with the above complaints.   She was on HD for about 5 years then they suggested she try PD since she was independent and liked to travel.  She started PD October last year.  She presents now with FTT, vol overload, gen weakness, N/V and diarrhea.  Family minimizes the duration today, but the ED notes say N/V have been there for a month   Chart review: 02/08 - syncope, bradycardia, high K , A/CRF, CAD, HTN, uncont DM2, anemia L shoulder pain. Solitary L kidney, echo 65% EF and apical infarct by Myoview w no ischemia. DC creat 3 02/08 - NSTEMI, pulm edema, cardiac arrest, acute on CRF, solitary kidney, UTI, PNA, DM2, anemia. Pt collapsed in ED and arrested requiring resuscitation, had pulm edema. Required acute HD, creat was 6, high K. Had cath and PCI with two stents placed, placement of tunneled HD cath and AV fistula, initiation of HD.  10/08 - SBO treated conseratively, ESRD on HD, DM 2 12/08 - Laminectomy L5-S1, DM 2, HTN, HL, CAD w hx stents, CKD, hx ovarian Ca treated with TAH-BSO 05/12 - acute ischemic CVA L temporal lobe w slurred speech, also ESRD, DM and CAD 11/13 - weakness and anemia, Hb 7.7, heme+ stool > rec'd transfusion, HD , dc'd home. 06/14 - tongue swelling c/w angioedema due to ACEi; rx'd with epi, benadryl, IV steroids and H2 blocker. Improved and dc'd home. Got PD in [name] 10/14 - gen weakness > dc'd with UTI and anemia Hb 7-8 range.  ECOli UTI, DM2, ESRD on PD 04/15 - anemia, GIB with large duodenal ulcer. ASA plavix were held, 3u prbcs given, PPI, etc.  dc'd back to SNF. Had openheart surgery in March apparently 05/15 - slurred speech, aphasia, altered MS > acute L frontal and parietal lobe MCA infarcts. Rec'd tPA.  Thin post tPA asymptomatic hemorrhage along the falx w/o mass effect. New onset afib. DC'd on low dose ASA with goal to change to coumadin once hemorrhage isodense on CT. Also resp failure , required vent support, COPD on home O2, anxiety, hx CABG , anemia, recent GIB as above, ESRD on PD.     Past Medical History  Past Medical History  Diagnosis Date  . Diabetes mellitus   . Hypertension   . Hyperlipidemia   . Coronary artery disease   . Dialysis patient     T/[facility]-Dr Hinda Lenis, stops hemodialysis 11-28-2012, will do peritoneal dialysis  starting 12-01-2012   . Carpal tunnel syndrome   . Anemia   . Ovarian cancer 1995    [name]  . Colon polyps 01/2012    Per colonoscopy, Dr. Gala Romney  . CKD (chronic kidney disease)     peritoneal dialysis at home nightly.  . Transfusion history 03-04-13    Transfusion x1 unit a month ago-[facility]  . Stroke 2010    2010-"brief weakness,tongue thickness,incoherent"-no residual affects.  Marland Kitchen  Mitral regurgitation   . Myocardial infarction 2009  . Peritonitis 06/23/2013   Past Surgical History  Past Surgical History  Procedure Laterality Date  . Complete abdominal hysterectomy  1995  . Abdominal hysterectomy    . Colonoscopy with esophagogastroduodenoscopy (egd)  01/27/2012    Dr. Gala Romney. Small hiatal hernia, abnormal second portion of the duodenum with questionable extrinsic compression. Single left sided colonic diverticulum, 2 colon polyps. Hamartomatous colon polyp  . Eus  04/09/2012    Dr. Owens Loffler: Nonspecific edema/thickening of the periampullary duodenum. Unremarkable biopsy  . Tonsillectomy      child  . Peritoneal catheter  insertion  10/30/2012    for peritoneal dialysis  . Back surgery  2009    x3 level fusion-Dr. Louanne Skye  . Cardiac catheterization      x2 stents-'09- Dr. Joellen Jersey  . Esophagogastroduodenoscopy (egd) with propofol N/A 03/11/2013    Dr. Owens Loffler, small duodenal diverticulum. Edematous duodenal mucosa with friability on biopsy (pathology negative)  . Three-vessel cabg, mitral valve annuloplasty, may ease  06/17/2013    [facility]  . Coronary artery bypass graft    . Esophagogastroduodenoscopy N/A 07/02/2013    SLF:GI BLEED DUE TO LARGE DUODENAL ULCER/Small hiatal hernia/PYLORIC CHANNEL ULCER/Multiple CLEANED BASED ulcers in the duodenal bulb and 2nd part of the duodenum  . Givens capsule study N/A 07/02/2013    Procedure: GIVENS CAPSULE STUDY;  Surgeon: Danie Binder, MD;  [facility];  Service: Endoscopy;  Laterality: N/A;  possible deployment in EGD negative  . Radiology with anesthesia N/A 08/19/2013    Procedure: RADIOLOGY WITH ANESTHESIA;  Surgeon: Rob Hickman, MD;  Location: [place];  Service: Radiology;  Laterality: N/A;   Family History  Family History  Problem Relation Age of Onset  . Diabetes Mother    Social History  reports that she quit smoking about 55 years ago. She has never used smokeless tobacco. She reports that she does not drink alcohol or use illicit drugs. Allergies  Allergies  Allergen Reactions  . Lisinopril Swelling   Home medications Prior to Admission medications   Medication Sig Start Date End Date Taking? Authorizing Provider  ALPRAZolam Duanne Moron) 0.5 MG tablet Take 0.5 mg by mouth at bedtime as needed for anxiety.   Yes Historical Provider, MD  carvedilol (COREG) 3.125 MG tablet Take 3.125 mg by mouth 2 (two) times daily with a meal.   Yes Historical Provider, MD  doxycycline (VIBRA-TABS) 100 MG tablet Take 100 mg by mouth 2 (two) times daily. 10/16/13 10/25/13 Yes Historical Provider, MD  insulin aspart protamine- aspart (NOVOLOG MIX  70/30) (70-30) 100 UNIT/ML injection Inject 10-20 Units into the skin See admin instructions. Take 20 units in the morning and 10 units at night.   Yes Historical Provider, MD  meclizine (ANTIVERT) 25 MG tablet Take 50 mg by mouth 2 (two) times daily as needed for dizziness.   Yes Historical Provider, MD  multivitamin (RENA-VIT) TABS tablet Take 1 tablet by mouth every morning.   Yes Historical Provider, MD  pantoprazole (PROTONIX) 40 MG tablet Take 1 tablet (40 mg total) by mouth 2 (two) times daily. 09/03/13  Yes Milus Banister, MD  EPINEPHrine 0.3 mg/0.3 mL IJ SOAJ injection Inject 0.3 mg into the muscle daily as needed (allergic reaction).    Historical Provider, MD   Liver Function Tests  Recent Labs Lab 10/23/13 2055  AST 19  ALT 15  ALKPHOS 86  BILITOT 0.4  PROT 7.1  ALBUMIN 2.3*  No results found for this basename: LIPASE, AMYLASE,  in the last 168 hours CBC  Recent Labs Lab 10/23/13 2055 10/24/13 0548  WBC 5.4 5.9  NEUTROABS 4.4  --   HGB 11.6* 10.5*  HCT 37.4 34.4*  MCV 97.7 96.1  PLT 179 784   Basic Metabolic Panel  Recent Labs Lab 10/23/13 2055 10/24/13 0548  NA 137 137  K 2.9* 3.6*  CL 92* 94*  CO2 27 27  GLUCOSE 99 99  BUN 25* 27*  CREATININE 7.92* 8.60*  CALCIUM 8.6 8.2*    Filed Vitals:   10/24/13 0130 10/24/13 0200 10/24/13 0230 10/24/13 0311  BP: 142/56 130/56 117/57 136/81  Pulse: 69 69 67 70  Temp:    97.8 F (36.6 C)  TempSrc:    Oral  Resp: 25 21 18 16   Height:    5\' 5"  (1.651 m)  Weight:    72.4 kg (159 lb 9.8 oz)  SpO2: 99% 97% 98% 96%   Exam Chronically ill appearing elderly female, doesn't speak up much, lying flat, not in distress No rash, cyanosis or gangrene Sclera anicteric, throat clear No jvd Chest bibasilar rales RRR no MRG Abd soft, NTND, no ascites Bilat LE edema 2+ thighs and lower legs Neuro is gen weak, alert, minimally verbal sluggish to respond physically   CXR bilat effusions , CM and +CHF mild-mod  PD  orders: No daytime or pause exchanges, they hang two 5-liter bags and do 5 cycles overnight starting about 10 pm and finishing about 7-8 am. Fill volume is 2000. She is dry during the day.  Assessment: 1 Nausea / vomiting / diarrhea- suspect this is development of uremia from underdialysis after switching from HD to PD last fall.  She has a lot of symptoms which i think are more chronic than acute, and she is also quite volume overloaded with CHF on cxr and significant leg edema bilat up to the hips.  They have been doing PD at home as prescribed. For now we will use HD to treat as I'm not sure the PD is reliable. If with HD her GI symptoms, fatigue, anorexia, etc show signs of improvement, then the use of PD should be reconsidered as a modality.   2 ESRD as above 3 Hx CABG in Mar 2015 4 CVA x 2 (2012, May 2015) 5 Anemia Hb 10.5 6 Vol excess as above 7 Hypokalemia - improving   Plan- HD today and tomorrow, max UF and max solute removal  Kelly Splinter MD (pgr) [phone]    [phone] [name], 8:18 AM

## 2013-10-24 NOTE — Progress Notes (Signed)
[facility] PROGRESS NOTE  [name] ZOX:[id number] DOB: [birth year] DOA: 10/23/2013 PCP: Shirline Frees, MD  Assessment/Plan: 1. Diarrhea  - Pending Stool for C+S, and C.diff PCR to evaluate for Infectious cause  - Will check ct abd/pelvis  2. Hypokalemia- Due to #1  - Replacement given orally and by IV of K+ , however pt refused remaining IV K secondary to pain - Mg 1.5 - will replace  3. CAD, NATIVE VESSEL  - stable   4. Chronic diastolic heart failure  - Mild CHF on Chest X-ray, Resolve with Dialysis Rx   5. ESRD (end stage renal disease)  - On Peritoneal Dialysis, Renal Consulted by EDP , Dr Jonnie Finner.   6. Type II or unspecified type diabetes mellitus with renal manifestations, uncontrolled  - SSI coverage PRN,a dn check HbA1c.   7. Peritoneal dialysis status  - Per above   8. Pulmonary edema, noncardiac  - Per above  9. Appetite loss- due to Illness, #1.  - Supportive Rx, monitor Sxs.   10. Nausea with vomiting  - Anti-Emetics PRN.   11. DVT Prophylaxis  - Lovenox  Code Status: Full Family Communication: Pt and family at bedside (indicate person spoken with, relationship, and if by phone, the number) Disposition Plan: Pending   Consultants:  Nephrology  Procedures:    Antibiotics:    HPI/Subjective: Continues with decreased appetite and diarrhea  Objective: Filed Vitals:   10/24/13 0200 10/24/13 0230 10/24/13 0311 10/24/13 1017  BP: 130/56 117/57 136/81 140/80  Pulse: 69 67 70 70  Temp:   97.8 F (36.6 C) 97.2 F (36.2 C)  TempSrc:   Oral Oral  Resp: 21 18 16 16   Height:   5\' 5"  (1.651 m)   Weight:   72.4 kg (159 lb 9.8 oz)   SpO2: 97% 98% 96% 96%    Intake/Output Summary (Last 24 hours) at 10/24/13 1332 Last data filed at 10/24/13 0900  Gross per 24 hour  Intake      0 ml  Output      1 ml  Net     -1 ml   Filed Weights   10/24/13 0311  Weight: 72.4 kg (159 lb 9.8 oz)    Exam:   General:  Awake, in  nad  Cardiovascular: regular, s1, s2  Respiratory: normal resp effort, no wheezing  Abdomen: soft, nondistended  Musculoskeletal: perfused, no clubbing   Data Reviewed: Basic Metabolic Panel:  Recent Labs Lab 10/23/13 2055 10/24/13 0548  NA 137 137  K 2.9* 3.6*  CL 92* 94*  CO2 27 27  GLUCOSE 99 99  BUN 25* 27*  CREATININE 7.92* 8.60*  CALCIUM 8.6 8.2*  MG  --  1.5   Liver Function Tests:  Recent Labs Lab 10/23/13 2055  AST 19  ALT 15  ALKPHOS 86  BILITOT 0.4  PROT 7.1  ALBUMIN 2.3*   No results found for this basename: LIPASE, AMYLASE,  in the last 168 hours No results found for this basename: AMMONIA,  in the last 168 hours CBC:  Recent Labs Lab 10/23/13 2055 10/24/13 0548  WBC 5.4 5.9  NEUTROABS 4.4  --   HGB 11.6* 10.5*  HCT 37.4 34.4*  MCV 97.7 96.1  PLT 179 180   Cardiac Enzymes: No results found for this basename: CKTOTAL, CKMB, CKMBINDEX, TROPONINI,  in the last 168 hours BNP (last 3 results) No results found for this basename: PROBNP,  in the last 8760 hours CBG:  Recent Labs Lab  10/24/13 0405 10/24/13 0736 10/24/13 1214  GLUCAP 97 211* 232*    No results found for this or any previous visit (from the past 240 hour(s)).   Studies: Dg Chest 2 View  10/24/2013   CLINICAL DATA:  Shortness of breath.  EXAM: CHEST  2 VIEW  COMPARISON:  Portable chest x-rays 08/22/2013 dating back to 08/19/2013. Two-view chest x-ray 06/27/2013.  FINDINGS: Prior sternotomy for aortic valve replacement. Cardiac silhouette markedly enlarged but stable. Mild diffuse interstitial pulmonary edema. Bilateral pleural effusions, moderately large on the right and small on the left. Associated consolidation in the lower lobes.  IMPRESSION: Mild CHF, with stable marked cardiomegaly, mild diffuse interstitial pulmonary edema and bilateral pleural effusions. Associated passive atelectasis in the lower lobes.   Electronically Signed   By: Evangeline Dakin M.D.   On:  10/24/2013 00:59    Scheduled Meds: . carvedilol  3.125 mg Oral BID WC  . enoxaparin (LOVENOX) injection  30 mg Subcutaneous Q24H  . insulin aspart  0-9 Units Subcutaneous 6 times per day  . multivitamin  1 tablet Oral [id number]  . pantoprazole  40 mg Oral BID  . sodium chloride  3 mL Intravenous Q12H  . sodium chloride  3 mL Intravenous Q12H   Continuous Infusions:   Principal Problem:   Diarrhea Active Problems:   CAD, NATIVE VESSEL   Chronic diastolic heart failure   ESRD (end stage renal disease)   Type II or unspecified type diabetes mellitus with renal manifestations, uncontrolled   Hypokalemia   Peritoneal dialysis status   Pulmonary edema, noncardiac   Appetite loss   Nausea with vomiting  Time spent: 53min  Kamirah Shugrue, [place] [facility] Pager [phone]. If 7PM-7AM, please contact night-coverage at [email or web address], password [facility] 10/24/2013, 1:32 PM  LOS: 1 day

## 2013-10-24 NOTE — ED Notes (Signed)
1 attempt to call report to floor; RN to call back  

## 2013-10-24 NOTE — ED Provider Notes (Signed)
CSN: [id number]     Arrival date & time 10/23/13  2025 History   First MD Initiated Contact with Patient 10/23/13 2301     Chief Complaint  Patient presents with  . Diarrhea  . Nausea     (Consider location/radiation/quality/duration/timing/severity/associated sxs/prior Treatment) HPI 68 year old female presents to the emergency department from home with complaint of loss of appetite, nausea vomiting and diarrhea.  Patient has had loss of appetite since having heart surgery in March.  Diarrhea ongoing for the last month.  Nausea started about a week ago, soon after starting a new medication for her loss of appetite.  Patient has history of diabetes, hypertension hyperlipidemia coronary disease status post CABG in March.  Patient also with GI bleed from duodenal ulcer, has not yet had repeat followup with EGD.  Patient also has end-stage renal disease and does peritoneal dialysis.  After her heart catheterization, she was receiving dialysis 3 times a week, and the family felt that they were pulling more fluid off of her at that time.  She restarted her peritoneal dialysis after discharge home from rehabilitation stay.  They have been slowly increasing her PD fluid shifts, and has noticed some improvement in her dyspnea and leg swelling.  He was concerned as patient has only had a few bites to eat each day and has been vomiting most of it up.  They feel that she is getting weaker over the last several weeks. Past Medical History  Diagnosis Date  . Diabetes mellitus   . Hypertension   . Hyperlipidemia   . Coronary artery disease   . Dialysis patient     T/Th/[facility]-Dr Hinda Lenis, stops hemodialysis 11-28-2012, will do peritoneal dialysis  starting 12-01-2012   . Carpal tunnel syndrome   . Anemia   . Ovarian cancer 1995    [name]  . Colon polyps 01/2012    Per colonoscopy, Dr. Gala Romney  . CKD (chronic kidney disease)     peritoneal dialysis at home nightly.  . Transfusion  history 03-04-13    Transfusion x1 unit a month ago-[facility]  . Stroke 2010    2010-"brief weakness,tongue thickness,incoherent"-no residual affects.  . Mitral regurgitation   . Myocardial infarction 2009  . Peritonitis 06/23/2013   Past Surgical History  Procedure Laterality Date  . Complete abdominal hysterectomy  1995  . Abdominal hysterectomy    . Colonoscopy with esophagogastroduodenoscopy (egd)  01/27/2012    Dr. Gala Romney. Small hiatal hernia, abnormal second portion of the duodenum with questionable extrinsic compression. Single left sided colonic diverticulum, 2 colon polyps. Hamartomatous colon polyp  . Eus  04/09/2012    Dr. Owens Loffler: Nonspecific edema/thickening of the periampullary duodenum. Unremarkable biopsy  . Tonsillectomy      child  . Peritoneal catheter insertion  10/30/2012    for peritoneal dialysis  . Back surgery  2009    x3 level fusion-Dr. Louanne Skye  . Cardiac catheterization      x2 stents-'09- Dr. Joellen Jersey  . Esophagogastroduodenoscopy (egd) with propofol N/A 03/11/2013    Dr. Owens Loffler, small duodenal diverticulum. Edematous duodenal mucosa with friability on biopsy (pathology negative)  . Three-vessel cabg, mitral valve annuloplasty, may ease  06/17/2013    [facility]  . Coronary artery bypass graft    . Esophagogastroduodenoscopy N/A 07/02/2013    SLF:GI BLEED DUE TO LARGE DUODENAL ULCER/Small hiatal hernia/PYLORIC CHANNEL ULCER/Multiple CLEANED BASED ulcers in the duodenal bulb and 2nd part of the duodenum  . Givens capsule study N/A 07/02/2013  Procedure: GIVENS CAPSULE STUDY;  Surgeon: Danie Binder, MD;  Location: [facility];  Service: Endoscopy;  Laterality: N/A;  possible deployment in EGD negative  . Radiology with anesthesia N/A 08/19/2013    Procedure: RADIOLOGY WITH ANESTHESIA;  Surgeon: Rob Hickman, MD;  Location: [place];  Service: Radiology;  Laterality: N/A;   Family History  Problem Relation Age of Onset  . Diabetes Mother     History  Substance Use Topics  . Smoking status: Former Smoker -- 0.50 packs/day for 15 years    Quit date: 03/25/1958  . Smokeless tobacco: Never Used  . Alcohol Use: No   OB History   Grav Para Term Preterm Abortions TAB SAB Ect Mult Living                 Review of Systems  See History of Present Illness; otherwise all other systems are reviewed and negative   Allergies  Lisinopril  Home Medications   Prior to Admission medications   Medication Sig Start Date End Date Taking? Authorizing Provider  ALPRAZolam Duanne Moron) 0.5 MG tablet Take 0.5 mg by mouth at bedtime as needed for anxiety.    Historical Provider, MD  carvedilol (COREG) 3.125 MG tablet Take 3.125 mg by mouth 2 (two) times daily with a meal.    Historical Provider, MD  EPINEPHrine 0.3 mg/0.3 mL IJ SOAJ injection Inject 0.3 mg into the muscle daily as needed (allergic reaction).    Historical Provider, MD  Insulin Isophane & Regular (HUMULIN 70/30 [place]) Inject 10-20 Units into the skin 2 (two) times daily. 20 units in the morning   10 units at night    Historical Provider, MD  meclizine (ANTIVERT) 25 MG tablet Take 50 mg by mouth 2 (two) times daily as needed for dizziness.    Historical Provider, MD  multivitamin (RENA-VIT) TABS tablet Take 1 tablet by mouth every morning.    Historical Provider, MD  pantoprazole (PROTONIX) 40 MG tablet Take 1 tablet (40 mg total) by mouth 2 (two) times daily. 09/03/13   Milus Banister, MD  simvastatin (ZOCOR) 40 MG tablet Take 40 mg by mouth at bedtime.    Historical Provider, MD   BP 153/75  Pulse 73  Temp(Src) 98 F (36.7 C) (Oral)  Resp 22  SpO2 98% Physical Exam  Nursing note and vitals reviewed. Constitutional: She is oriented to person, place, and time. She appears well-developed and well-nourished.  Elderly frail female in no acute distress  HENT:  Head: Normocephalic and atraumatic.  Nose: Nose normal.  Mouth/Throat: Oropharynx is clear and moist.  Eyes:  Conjunctivae and EOM are normal. Pupils are equal, round, and reactive to light.  Neck: Normal range of motion. Neck supple. No JVD present. No tracheal deviation present. No thyromegaly present.  Cardiovascular: Normal rate, regular rhythm, normal heart sounds and intact distal pulses.  Exam reveals no gallop and no friction rub.   No murmur heard. Pulmonary/Chest: Effort normal. No stridor. No respiratory distress. She has no wheezes. She has rales. She exhibits no tenderness.  Patient with tachypnea, noted sats in the mid-80s, reports she usually wears 2 L at night  Abdominal: Soft. Bowel sounds are normal. She exhibits no distension and no mass. There is no tenderness. There is no rebound and no guarding.  Musculoskeletal: Normal range of motion. She exhibits edema ( trace edema in the calves). She exhibits no tenderness.  Lymphadenopathy:    She has no cervical adenopathy.  Neurological: She is  alert and oriented to person, place, and time. She exhibits normal muscle tone. Coordination normal.  Skin: Skin is warm and dry. No rash noted. No erythema. No pallor.  Psychiatric: She has a normal mood and affect. Her behavior is normal. Judgment and thought content normal.    ED Course  Procedures (including critical care time) Labs Review Labs Reviewed  COMPREHENSIVE METABOLIC PANEL - Abnormal; Notable for the following:    Potassium 2.9 (*)    Chloride 92 (*)    BUN 25 (*)    Creatinine, Ser 7.92 (*)    Albumin 2.3 (*)    GFR calc non Af Amer 5 (*)    GFR calc Af Amer 5 (*)    Anion gap 18 (*)    All other components within normal limits  CBC WITH DIFFERENTIAL - Abnormal; Notable for the following:    RBC 3.83 (*)    Hemoglobin 11.6 (*)    RDW 17.1 (*)    Neutrophils Relative % 80 (*)    Lymphs Abs 0.6 (*)    All other components within normal limits  GLUCOSE, CAPILLARY  GI PATHOGEN PANEL BY PCR, STOOL  HEMOGLOBIN P2R  BASIC METABOLIC PANEL  CBC    Imaging Review Dg Chest  2 View  10/24/2013   CLINICAL DATA:  Shortness of breath.  EXAM: CHEST  2 VIEW  COMPARISON:  Portable chest x-rays 08/22/2013 dating back to 08/19/2013. Two-view chest x-ray 06/27/2013.  FINDINGS: Prior sternotomy for aortic valve replacement. Cardiac silhouette markedly enlarged but stable. Mild diffuse interstitial pulmonary edema. Bilateral pleural effusions, moderately large on the right and small on the left. Associated consolidation in the lower lobes.  IMPRESSION: Mild CHF, with stable marked cardiomegaly, mild diffuse interstitial pulmonary edema and bilateral pleural effusions. Associated passive atelectasis in the lower lobes.   Electronically Signed   By: Evangeline Dakin M.D.   On: 10/24/2013 00:59     EKG Interpretation None      MDM   Final diagnoses:  Hypokalemia  Pulmonary edema, noncardiac  Nausea and vomiting, vomiting of unspecified type  Diarrhea  ESRD (end stage renal disease) on dialysis  Peritoneal dialysis status    68 year old female with poor appetite for several months, diarrhea for one month, and nausea since starting mirtazapine.  Family stop medication 2 days ago, the patient still nauseated and having difficulties eating.  Patient noted to be hypokalemic.  Patient reports increased dyspnea, will check chest x-ray.  Will replete potassium, may need admission multiple comorbidities and persistent diarrhea.   1:26 AM Pt with cxr showing pulmonary edema, c/w exam with tachypnea, increased work of breathing.  Will d/w hospitalist for admission, probable renal consult.   Kalman Drape, MD 10/24/13 [phone]

## 2013-10-25 DIAGNOSIS — N186 End stage renal disease: Secondary | ICD-10-CM

## 2013-10-25 LAB — HEPATITIS B SURFACE ANTIGEN: Hepatitis B Surface Ag: NEGATIVE

## 2013-10-25 LAB — CBC
HCT: 32.3 % — ABNORMAL LOW (ref 36.0–46.0)
Hemoglobin: 9.8 g/dL — ABNORMAL LOW (ref 12.0–15.0)
MCH: 29.5 pg (ref 26.0–34.0)
MCHC: 30.3 g/dL (ref 30.0–36.0)
MCV: 97.3 fL (ref 78.0–100.0)
PLATELETS: 152 10*3/uL (ref 150–400)
RBC: 3.32 MIL/uL — AB (ref 3.87–5.11)
RDW: 17.7 % — ABNORMAL HIGH (ref 11.5–15.5)
WBC: 4.6 10*3/uL (ref 4.0–10.5)

## 2013-10-25 LAB — BASIC METABOLIC PANEL
Anion gap: 15 (ref 5–15)
BUN: 29 mg/dL — ABNORMAL HIGH (ref 6–23)
CALCIUM: 7.9 mg/dL — AB (ref 8.4–10.5)
CO2: 27 mEq/L (ref 19–32)
CREATININE: 8.31 mg/dL — AB (ref 0.50–1.10)
Chloride: 95 mEq/L — ABNORMAL LOW (ref 96–112)
GFR, EST AFRICAN AMERICAN: 5 mL/min — AB (ref >90)
GFR, EST NON AFRICAN AMERICAN: 4 mL/min — AB (ref >90)
Glucose, Bld: 135 mg/dL — ABNORMAL HIGH (ref 70–99)
Potassium: 3.2 mEq/L — ABNORMAL LOW (ref 3.7–5.3)
Sodium: 137 mEq/L (ref 137–147)

## 2013-10-25 LAB — GLUCOSE, CAPILLARY
GLUCOSE-CAPILLARY: 174 mg/dL — AB (ref 70–99)
Glucose-Capillary: 282 mg/dL — ABNORMAL HIGH (ref 70–99)
Glucose-Capillary: 140 mg/dL — ABNORMAL HIGH (ref 70–99)
Glucose-Capillary: 98 mg/dL (ref 70–99)
Glucose-Capillary: 175 mg/dL — ABNORMAL HIGH (ref 70–99)

## 2013-10-25 MED ORDER — INSULIN ASPART 100 UNIT/ML ~~LOC~~ SOLN
0.0000 [IU] | Freq: Three times a day (TID) | SUBCUTANEOUS | Status: DC
  Administered 2013-10-28: 3 [IU] via SUBCUTANEOUS
  Administered 2013-10-29: 2 [IU] via SUBCUTANEOUS

## 2013-10-25 MED ORDER — INSULIN ASPART 100 UNIT/ML ~~LOC~~ SOLN
0.0000 [IU] | Freq: Every day | SUBCUTANEOUS | Status: DC
  Administered 2013-10-28: 2 [IU] via SUBCUTANEOUS

## 2013-10-25 NOTE — Clinical Documentation Improvement (Signed)
Presents with Nausea, Vomiting and Diarrhea; has ESRD. Conflicting documentation as to the underlying cause of admission.  Please clarify cause of admit: Volume Overload (Renal)                    Volume Overload with CHF - no diuretics ordered; treated with PD? (Renal)                    Acute Pulmonary Edema          Diarrhea         Other Condition      Thank you! Zoila Shutter ,RN Clinical Documentation Specialist:  [place] [employer]

## 2013-10-25 NOTE — Progress Notes (Signed)
[facility] PROGRESS NOTE  [name] FAO:[id number] DOB: September 30, 1945 DOA: 10/23/2013 PCP: Shirline Frees, MD  Assessment/Plan: Diarrhea  - Pending Stool for C+S, and C.diff PCR to evaluate for Infectious cause  - ct abd/pelvis unremarkable   Hypokalemia- Due to #1  - replete - Mg 1.5 - replaced  CAD, NATIVE VESSEL  - stable   Chronic diastolic heart failure  - Mild CHF on Chest X-ray, Resolve with Dialysis Rx  -wean O2 as tolerated- wears 2L at night only  ESRD (end stage renal disease)  - On Peritoneal Dialysis, Renal Consulted by EDP , Dr Jonnie Finner.  -for HD today  Type II or unspecified type diabetes mellitus with renal manifestations, uncontrolled  - SSI coverage PRN,   -HbA1c. 7.3   Peritoneal dialysis status  - Per above   Pulmonary edema, noncardiac  - Per above  Appetite loss- due to Illness, #1.  - Supportive Rx, monitor Sxs.  -still NPO- will give diet and advance as tolerated  Nausea with vomiting  - Anti-Emetics PRN.     Code Status: Full Family Communication: Pt and family at bedside  Disposition Plan: home 1-2 days   Consultants:  Nephrology  Procedures:    Antibiotics:    HPI/Subjective: Asking for food   Objective: Filed Vitals:   10/24/13 1702 10/24/13 2109 10/25/13 0010 10/25/13 0449  BP: 134/79 139/89 136/80 98/64  Pulse: 79 55 58 64  Temp: 97.6 F (36.4 C) 97.5 F (36.4 C)  97.9 F (36.6 C)  TempSrc: Oral Oral  Oral  Resp: 16 18  16   Height:      Weight:      SpO2: 96% 98%  94%    Intake/Output Summary (Last 24 hours) at 10/25/13 0758 Last data filed at 10/24/13 1700  Gross per 24 hour  Intake      0 ml  Output      0 ml  Net      0 ml   Filed Weights   10/24/13 0311  Weight: 72.4 kg (159 lb 9.8 oz)    Exam:   General:  Awake, in nad  Cardiovascular: regular, s1, s2  Respiratory: normal resp effort, no wheezing  Abdomen: soft, nondistended  Musculoskeletal: perfused, no clubbing   Data  Reviewed: Basic Metabolic Panel:  Recent Labs Lab 10/23/13 2055 10/24/13 0548  NA 137 137  K 2.9* 3.6*  CL 92* 94*  CO2 27 27  GLUCOSE 99 99  BUN 25* 27*  CREATININE 7.92* 8.60*  CALCIUM 8.6 8.2*  MG  --  1.5   Liver Function Tests:  Recent Labs Lab 10/23/13 2055  AST 19  ALT 15  ALKPHOS 86  BILITOT 0.4  PROT 7.1  ALBUMIN 2.3*   No results found for this basename: LIPASE, AMYLASE,  in the last 168 hours No results found for this basename: AMMONIA,  in the last 168 hours CBC:  Recent Labs Lab 10/23/13 2055 10/24/13 0548  WBC 5.4 5.9  NEUTROABS 4.4  --   HGB 11.6* 10.5*  HCT 37.4 34.4*  MCV 97.7 96.1  PLT 179 180   Cardiac Enzymes: No results found for this basename: CKTOTAL, CKMB, CKMBINDEX, TROPONINI,  in the last 168 hours BNP (last 3 results) No results found for this basename: PROBNP,  in the last 8760 hours CBG:  Recent Labs Lab 10/24/13 0736 10/24/13 1214 10/24/13 1657 10/24/13 2101 10/25/13 0442  GLUCAP 211* 232* 265* 130* 282*    No results found for this  or any previous visit (from the past 240 hour(s)).   Studies: Ct Abdomen Pelvis Wo Contrast  10/24/2013   CLINICAL DATA:  Suspected colitis.  EXAM: CT ABDOMEN AND PELVIS WITHOUT CONTRAST  TECHNIQUE: Multidetector CT imaging of the abdomen and pelvis was performed following the standard protocol without IV contrast.  COMPARISON:  CT abdomen 02/04/2012  FINDINGS: Small bilateral pleural effusions with bibasilar atelectasis. Cardiac enlargement with extensive coronary artery calcification.  Mild pneumoperitoneum felt to be related to peritoneal dialysis. Peritoneal dialysis catheter coiled in the right lower quadrant. Small amount of free fluid in the abdomen consistent with dialysis.  Liver spleen and pancreas are normal. Left kidney is absent. Extensive right renal artery calcification. Atherosclerotic aorta without aneurysm.  Negative for bowel obstruction. No bowel thickening or inflammation  identified. No mass or adenopathy is present. Lumbar fusion L5-S1.  IMPRESSION: Mild pneumoperitoneum consistent with peritoneal dialysis.  Negative for bowel obstruction or bowel thickening. No evidence of colonic thickening.   Electronically Signed   By: Franchot Gallo M.D.   On: 10/24/2013 20:39   Dg Chest 2 View  10/24/2013   CLINICAL DATA:  Shortness of breath.  EXAM: CHEST  2 VIEW  COMPARISON:  Portable chest x-rays 08/22/2013 dating back to 08/19/2013. Two-view chest x-ray 06/27/2013.  FINDINGS: Prior sternotomy for aortic valve replacement. Cardiac silhouette markedly enlarged but stable. Mild diffuse interstitial pulmonary edema. Bilateral pleural effusions, moderately large on the right and small on the left. Associated consolidation in the lower lobes.  IMPRESSION: Mild CHF, with stable marked cardiomegaly, mild diffuse interstitial pulmonary edema and bilateral pleural effusions. Associated passive atelectasis in the lower lobes.   Electronically Signed   By: Evangeline Dakin M.D.   On: 10/24/2013 00:59    Scheduled Meds: . carvedilol  3.125 mg Oral BID WC  . enoxaparin (LOVENOX) injection  30 mg Subcutaneous Q24H  . insulin aspart  0-9 Units Subcutaneous 6 times per day  . multivitamin  1 tablet Oral [id number]  . pantoprazole  40 mg Oral BID  . sodium chloride  3 mL Intravenous Q12H  . sodium chloride  3 mL Intravenous Q12H   Continuous Infusions:   Principal Problem:   Diarrhea Active Problems:   CAD, NATIVE VESSEL   Chronic diastolic heart failure   ESRD on peritoneal dialysis   Type II or unspecified type diabetes mellitus with renal manifestations, uncontrolled   Hypokalemia   Pulmonary edema, noncardiac   Appetite loss   Nausea with vomiting  Time spent: 21min  [name], [facility] Pager [phone]. If 7PM-7AM, please contact night-coverage at [email or web address], password [facility] 10/25/2013, 7:58 AM  LOS: 2 days

## 2013-10-25 NOTE — Progress Notes (Signed)
Inpatient Diabetes Program Recommendations  AACE/ADA: New Consensus Statement on Inpatient Glycemic Control (2013)  Target Ranges:  Prepandial:   less than 140 mg/dL      Peak postprandial:   less than 180 mg/dL (1-2 hours)      Critically ill patients:  140 - 180 mg/dL   Results for [name], [name] (MRN [id number]) as of 10/25/2013 10:07  Ref. Range 10/24/2013 07:36 10/24/2013 12:14 10/24/2013 16:57 10/24/2013 21:01 10/25/2013 04:42 10/25/2013 08:01  Glucose-Capillary Latest Range: 70-99 mg/dL 211 (H) 232 (H) 265 (H) 130 (H) 282 (H) 140 (H)    Diabetes history: DM2 Outpatient Diabetes medications: 70/30 20 units QAM, 70/30 10 units QPM Current orders for Inpatient glycemic control: Novolog 0-9 units Q4H  Inpatient Diabetes Program Recommendations Insulin - Basal: Please consider ordering low dose basal insulin. Recommend starting with Lantus 7 units daily (starting now; based on 72 kg x 0.1 units).   Thanks, Barnie Alderman, RN, MSN, CCRN Diabetes Coordinator Inpatient Diabetes Program [phone] (Team Pager) [phone] ([facility] office) [phone] [facility] office)

## 2013-10-25 NOTE — Procedures (Signed)
Pt seen on HD  Ap 170  Vp 170.  BFR 400.  Says diarrhea is better.  I suggested she convert to HD at least for a few weeks and then be re assessed by her nephrologist.

## 2013-10-25 NOTE — Progress Notes (Signed)
Pt. Went to hemodialysis today.  CAPD orders still entered.  Paged [facility] on-call.  Dr. Marval Regal returned the page.  MD advised to hold CAPD for this evening & check with Dr. Mercy Moore in the AM.  Jillyn Ledger, MBA, BS, RN

## 2013-10-26 DIAGNOSIS — Z992 Dependence on renal dialysis: Secondary | ICD-10-CM

## 2013-10-26 LAB — GLUCOSE, CAPILLARY
GLUCOSE-CAPILLARY: 112 mg/dL — AB (ref 70–99)
GLUCOSE-CAPILLARY: 127 mg/dL — AB (ref 70–99)
GLUCOSE-CAPILLARY: 200 mg/dL — AB (ref 70–99)
Glucose-Capillary: 161 mg/dL — ABNORMAL HIGH (ref 70–99)
Glucose-Capillary: 177 mg/dL — ABNORMAL HIGH (ref 70–99)

## 2013-10-26 LAB — GI PATHOGEN PANEL BY PCR, STOOL
C difficile toxin A/B: NEGATIVE
CRYPTOSPORIDIUM BY PCR: NEGATIVE
Campylobacter by PCR: NEGATIVE
E COLI (ETEC) LT/ST: NEGATIVE
E COLI 0157 BY PCR: NEGATIVE
E coli (STEC): NEGATIVE
G lamblia by PCR: NEGATIVE
Norovirus GI/GII: NEGATIVE
Rotavirus A by PCR: NEGATIVE
SALMONELLA BY PCR: NEGATIVE
SHIGELLA BY PCR: NEGATIVE

## 2013-10-26 MED ORDER — DARBEPOETIN ALFA-POLYSORBATE 60 MCG/0.3ML IJ SOLN
60.0000 ug | INTRAMUSCULAR | Status: DC
Start: 2013-10-27 — End: 2013-10-29
  Administered 2013-10-27: 60 ug via INTRAVENOUS
  Filled 2013-10-26: qty 0.3

## 2013-10-26 MED ORDER — NYSTATIN 100000 UNIT/ML MT SUSP
5.0000 mL | Freq: Four times a day (QID) | OROMUCOSAL | Status: DC
  Administered 2013-10-26 – 2013-10-28 (×6): 500000 [IU] via ORAL
  Filled 2013-10-26 (×17): qty 5

## 2013-10-26 NOTE — Progress Notes (Signed)
Utilization review completed.  

## 2013-10-26 NOTE — Evaluation (Signed)
Clinical/Bedside Swallow Evaluation Patient Details  Name: [name] MRN: [id number] Date of Birth: Aug 31, 1945  Today's Date: 10/26/2013 Time: 1545-1600 SLP Time Calculation (min): 15 min  Past Medical History:  Past Medical History  Diagnosis Date  . Diabetes mellitus   . Hypertension   . Hyperlipidemia   . Coronary artery disease   . Carpal tunnel syndrome   . Anemia   . Ovarian cancer 1995    [name]  . Colon polyps 01/2012    Per colonoscopy, Dr. Gala Romney  . ESRD (end stage renal disease)     Started dialysis in 2009 with hemodialysis and did HD for about 5 years using a R arm AVF.  Changed to PD due to pt likes to travel and is independent.  Her nephrologist is Dr Hinda Lenis in [place], [place].     . Transfusion history 03-04-13    Transfusion x1 unit a month ago-[facility]  . Stroke 2010    2010-"brief weakness,tongue thickness,incoherent"-no residual affects.  . Mitral regurgitation   . Myocardial infarction 2009  . Peritonitis 06/23/2013   Past Surgical History:  Past Surgical History  Procedure Laterality Date  . Complete abdominal hysterectomy  1995  . Abdominal hysterectomy    . Colonoscopy with esophagogastroduodenoscopy (egd)  01/27/2012    Dr. Gala Romney. Small hiatal hernia, abnormal second portion of the duodenum with questionable extrinsic compression. Single left sided colonic diverticulum, 2 colon polyps. Hamartomatous colon polyp  . Eus  04/09/2012    Dr. Owens Loffler: Nonspecific edema/thickening of the periampullary duodenum. Unremarkable biopsy  . Tonsillectomy      child  . Peritoneal catheter insertion  10/30/2012    for peritoneal dialysis  . Back surgery  2009    x3 level fusion-Dr. Louanne Skye  . Cardiac catheterization      x2 stents-'09- Dr. Joellen Jersey  . Esophagogastroduodenoscopy (egd) with propofol N/A 03/11/2013    Dr. Owens Loffler, small duodenal diverticulum. Edematous duodenal mucosa with friability on biopsy (pathology  negative)  . Three-vessel cabg, mitral valve annuloplasty, may ease  06/17/2013    [facility]  . Coronary artery bypass graft    . Esophagogastroduodenoscopy N/A 07/02/2013    SLF:GI BLEED DUE TO LARGE DUODENAL ULCER/Small hiatal hernia/PYLORIC CHANNEL ULCER/Multiple CLEANED BASED ulcers in the duodenal bulb and 2nd part of the duodenum  . Givens capsule study N/A 07/02/2013    Procedure: GIVENS CAPSULE STUDY;  Surgeon: Danie Binder, MD;  Location[facility];  Service: Endoscopy;  Laterality: N/A;  possible deployment in EGD negative  . Radiology with anesthesia N/A 08/19/2013    Procedure: RADIOLOGY WITH ANESTHESIA;  Surgeon: Rob Hickman, MD;  Location: [place];  Service: Radiology;  Laterality: N/A;   HPI:  [name] is a 68 y.o. female with a history of ESRD on PD, CAD, DM2, HTN who presents tot he ED with complaints of nausea and diarrhea x 1 month and worse over the past 5 days. She denies any fevers or chills. She does report having indigestion like symptoms and heartburn. She reports having a poor appetite as well. Pt being treated for thrush.    Assessment / Plan / Recommendation Clinical Impression  Pt demonstrates normal swallow function without evidence of oropharyngeal or esophageal impairment. She does endorse early satiety and reports she felt the same when when she had ulcers and a resulting GI bleed earlier this year. Pt may continue current diet and advance per MD. Will share pt report with MD. No SLP f/u  needed.     Aspiration Risk  Mild    Diet Recommendation Regular;Thin liquid   Liquid Administration via: Cup;Straw Medication Administration: Whole meds with liquid Supervision: Patient able to self feed Compensations: Slow rate;Small sips/bites Postural Changes and/or Swallow Maneuvers: Seated upright 90 degrees    Other  Recommendations Recommended Consults: Consider GI evaluation Oral Care Recommendations: Oral care BID   Follow Up Recommendations   None    Frequency and Duration        Pertinent Vitals/Pain NA    SLP Swallow Goals     Swallow Study Prior Functional Status       General HPI: [name] is a 68 y.o. female with a history of ESRD on PD, CAD, DM2, HTN who presents tot he ED with complaints of nausea and diarrhea x 1 month and worse over the past 5 days. She denies any fevers or chills. She does report having indigestion like symptoms and heartburn. She reports having a poor appetite as well. Pt being treated for thrush.  Previous Swallow Assessment: BSE 08/22/13 - regular thin Diet Prior to this Study: Thin liquids Temperature Spikes Noted: No Respiratory Status: Nasal cannula History of Recent Intubation: No Behavior/Cognition: Alert;Cooperative;Pleasant mood Oral Cavity - Dentition: Dentures, top;Dentures, bottom Self-Feeding Abilities: Able to feed self Patient Positioning: Upright in bed Baseline Vocal Quality: Clear Volitional Cough: Strong Volitional Swallow: Able to elicit    Oral/Motor/Sensory Function Overall Oral Motor/Sensory Function: Appears within functional limits for tasks assessed   Ice Chips     Thin Liquid Thin Liquid: Within functional limits Presentation: Cup;Straw;Self Fed    Nectar Thick Nectar Thick Liquid: Not tested   Honey Thick Honey Thick Liquid: Not tested   Puree Puree: Within functional limits   Solid   GO    Solid: Within functional limits      [facility], MA CCC-SLP [phone]  [name] 10/26/2013,4:10 PM

## 2013-10-26 NOTE — Progress Notes (Signed)
10/26/2013 4:00 PM Hemodialysis Outpatient Note; this patient has been accepted at the [facility] center on a Tuesday, Thursday and Saturday schedule. Patient has a 10:45 AM chair time. Thank you. [name]

## 2013-10-26 NOTE — Progress Notes (Signed)
[facility] PROGRESS NOTE  MAUD RUBENDALL UTM:[id number] DOB: 01-21-1946 DOA: 10/23/2013 PCP: Shirline Frees, MD  Assessment/Plan: Diarrhea - resolved - Pending Stool for C+S, and C.diff PCR to evaluate for Infectious cause  - ct abd/pelvis unremarkable  Thrush -nysatin swish and swallow   Hypokalemia - replete - Mg 1.5 - replaced  CAD, NATIVE VESSEL  - stable   Chronic diastolic heart failure  - Mild CHF on Chest X-ray, Resolve with Dialysis Rx  -wean O2 as tolerated- wears 2L at night only  ESRD (end stage renal disease)  - renal -PD changed to HD -s/p HD   Type II or unspecified type diabetes mellitus with renal manifestations, uncontrolled  - SSI coverage PRN,   -HbA1c. 7.3   Peritoneal dialysis status  - Per above   Pulmonary edema, noncardiac  - Per above  Appetite loss- due to possible thrush.  - Supportive Rx, monitor Sxs.  -will get SLP in AM if no improvement   Nausea with vomiting  - Anti-Emetics PRN.     Code Status: Full Family Communication: Pt and family at bedside  Disposition Plan: home 1-2 days   Consultants:  Nephrology  Procedures:    Antibiotics:    HPI/Subjective: Still with no appetite Feeling like food gets stuck when she swallow   Objective: Filed Vitals:   10/25/13 1351 10/25/13 1841 10/25/13 1945 10/26/13 0545  BP: 100/61 105/53 99/62 108/68  Pulse: 68 57 63 58  Temp: 98.7 F (37.1 C) 97.9 F (36.6 C) 99.2 F (37.3 C) 98.8 F (37.1 C)  TempSrc: Oral Oral    Resp: 21 23 17 17   Height:      Weight:   71.3 kg (157 lb 3 oz)   SpO2: 94% 98% 99% 97%    Intake/Output Summary (Last 24 hours) at 10/26/13 0819 Last data filed at 10/25/13 1700  Gross per 24 hour  Intake    120 ml  Output   1000 ml  Net   -880 ml   Filed Weights   10/24/13 0311 10/25/13 1945  Weight: 72.4 kg (159 lb 9.8 oz) 71.3 kg (157 lb 3 oz)    Exam:   General:  Awake, in nad  Cardiovascular: regular, s1, s2  Respiratory:  normal resp effort, no wheezing  Abdomen: soft, nondistended  Musculoskeletal: perfused, no clubbing   Data Reviewed: Basic Metabolic Panel:  Recent Labs Lab 10/23/13 2055 10/24/13 0548 10/25/13 0800  NA 137 137 137  K 2.9* 3.6* 3.2*  CL 92* 94* 95*  CO2 27 27 27   GLUCOSE 99 99 135*  BUN 25* 27* 29*  CREATININE 7.92* 8.60* 8.31*  CALCIUM 8.6 8.2* 7.9*  MG  --  1.5  --    Liver Function Tests:  Recent Labs Lab 10/23/13 2055  AST 19  ALT 15  ALKPHOS 86  BILITOT 0.4  PROT 7.1  ALBUMIN 2.3*   No results found for this basename: LIPASE, AMYLASE,  in the last 168 hours No results found for this basename: AMMONIA,  in the last 168 hours CBC:  Recent Labs Lab 10/23/13 2055 10/24/13 0548 10/25/13 0800  WBC 5.4 5.9 4.6  NEUTROABS 4.4  --   --   HGB 11.6* 10.5* 9.8*  HCT 37.4 34.4* 32.3*  MCV 97.7 96.1 97.3  PLT 179 180 152   Cardiac Enzymes: No results found for this basename: CKTOTAL, CKMB, CKMBINDEX, TROPONINI,  in the last 168 hours BNP (last 3 results) No results found for this basename:  PROBNP,  in the last 8760 hours CBG:  Recent Labs Lab 10/25/13 0801 10/25/13 1347 10/25/13 1642 10/25/13 1945 10/26/13 0726  GLUCAP 140* 98 175* 174* 112*    Recent Results (from the past 240 hour(s))  STOOL CULTURE     Status: None   Collection Time    10/24/13  7:55 AM      Result Value Ref Range Status   Specimen Description PERIRECTAL   Final   Special Requests Normal   Final   Culture     Final   Value: Culture reincubated for better growth     Performed at [facility]   Report Status PENDING   Incomplete     Studies: Ct Abdomen Pelvis Wo Contrast  10/24/2013   CLINICAL DATA:  Suspected colitis.  EXAM: CT ABDOMEN AND PELVIS WITHOUT CONTRAST  TECHNIQUE: Multidetector CT imaging of the abdomen and pelvis was performed following the standard protocol without IV contrast.  COMPARISON:  CT abdomen 02/04/2012  FINDINGS: Small bilateral pleural  effusions with bibasilar atelectasis. Cardiac enlargement with extensive coronary artery calcification.  Mild pneumoperitoneum felt to be related to peritoneal dialysis. Peritoneal dialysis catheter coiled in the right lower quadrant. Small amount of free fluid in the abdomen consistent with dialysis.  Liver spleen and pancreas are normal. Left kidney is absent. Extensive right renal artery calcification. Atherosclerotic aorta without aneurysm.  Negative for bowel obstruction. No bowel thickening or inflammation identified. No mass or adenopathy is present. Lumbar fusion L5-S1.  IMPRESSION: Mild pneumoperitoneum consistent with peritoneal dialysis.  Negative for bowel obstruction or bowel thickening. No evidence of colonic thickening.   Electronically Signed   By: Franchot Gallo M.D.   On: 10/24/2013 20:39    Scheduled Meds: . carvedilol  3.125 mg Oral BID WC  . enoxaparin (LOVENOX) injection  30 mg Subcutaneous Q24H  . insulin aspart  0-5 Units Subcutaneous QHS  . insulin aspart  0-9 Units Subcutaneous TID WC  . multivitamin  1 tablet Oral BH-q7a  . pantoprazole  40 mg Oral BID  . sodium chloride  3 mL Intravenous Q12H   Continuous Infusions:   Principal Problem:   Diarrhea Active Problems:   CAD, NATIVE VESSEL   Chronic diastolic heart failure   ESRD on peritoneal dialysis   Type II or unspecified type diabetes mellitus with renal manifestations, uncontrolled   Hypokalemia   Pulmonary edema, noncardiac   Appetite loss   Nausea with vomiting  Time spent: 23min  Jakari Sada, [place] [facility] Pager [phone]. If 7PM-7AM, please contact night-coverage at [email or web address], password [facility] 10/26/2013, 8:19 AM  LOS: 3 days

## 2013-10-26 NOTE — Progress Notes (Addendum)
S:Appetite still poor O:BP 115/66  Pulse 59  Temp(Src) 98.6 F (37 C) (Oral)  Resp 18  Ht 5\' 5"  (1.651 m)  Wt 71.3 kg (157 lb 3 oz)  BMI 26.16 kg/m2  SpO2 99%  Intake/Output Summary (Last 24 hours) at 10/26/13 0939 Last data filed at 10/25/13 1700  Gross per 24 hour  Intake    120 ml  Output   1000 ml  Net   -880 ml   Weight change:  DJT:TSVXB and alert CVS:RRR Resp:basilar crackles Abd:+ BS NTND Ext: 1+ edema  RUA AVF + bruit NEURO:CNI no asterixis   . carvedilol  3.125 mg Oral BID WC  . enoxaparin (LOVENOX) injection  30 mg Subcutaneous Q24H  . insulin aspart  0-5 Units Subcutaneous QHS  . insulin aspart  0-9 Units Subcutaneous TID WC  . multivitamin  1 tablet Oral BH-q7a  . nystatin  5 mL Oral QID  . pantoprazole  40 mg Oral BID  . sodium chloride  3 mL Intravenous Q12H   Ct Abdomen Pelvis Wo Contrast  10/24/2013   CLINICAL DATA:  Suspected colitis.  EXAM: CT ABDOMEN AND PELVIS WITHOUT CONTRAST  TECHNIQUE: Multidetector CT imaging of the abdomen and pelvis was performed following the standard protocol without IV contrast.  COMPARISON:  CT abdomen 02/04/2012  FINDINGS: Small bilateral pleural effusions with bibasilar atelectasis. Cardiac enlargement with extensive coronary artery calcification.  Mild pneumoperitoneum felt to be related to peritoneal dialysis. Peritoneal dialysis catheter coiled in the right lower quadrant. Small amount of free fluid in the abdomen consistent with dialysis.  Liver spleen and pancreas are normal. Left kidney is absent. Extensive right renal artery calcification. Atherosclerotic aorta without aneurysm.  Negative for bowel obstruction. No bowel thickening or inflammation identified. No mass or adenopathy is present. Lumbar fusion L5-S1.  IMPRESSION: Mild pneumoperitoneum consistent with peritoneal dialysis.  Negative for bowel obstruction or bowel thickening. No evidence of colonic thickening.   Electronically Signed   By: Franchot Gallo M.D.   On:  10/24/2013 20:39   BMET    Component Value Date/Time   NA 137 10/25/2013 0800   K 3.2* 10/25/2013 0800   CL 95* 10/25/2013 0800   CO2 27 10/25/2013 0800   GLUCOSE 135* 10/25/2013 0800   BUN 29* 10/25/2013 0800   BUN 42* 01/02/2012 0919   CREATININE 8.31* 10/25/2013 0800   CREATININE 5.80 01/02/2012 0919   CALCIUM 7.9* 10/25/2013 0800   CALCIUM 8.5 01/12/2007 0545   GFRNONAA 4* 10/25/2013 0800   GFRAA 5* 10/25/2013 0800   CBC    Component Value Date/Time   WBC 4.6 10/25/2013 0800   RBC 3.32* 10/25/2013 0800   RBC 2.25* 01/10/2013 1437   HGB 9.8* 10/25/2013 0800   HCT 32.3* 10/25/2013 0800   PLT 152 10/25/2013 0800   MCV 97.3 10/25/2013 0800   MCH 29.5 10/25/2013 0800   MCHC 30.3 10/25/2013 0800   RDW 17.7* 10/25/2013 0800   LYMPHSABS 0.6* 10/23/2013 2055   MONOABS 0.4 10/23/2013 2055   EOSABS 0.0 10/23/2013 2055   BASOSABS 0.0 10/23/2013 2055     Impression: 1. N/V/D  Suspect N/V due to uremia.  Nausea is better but still present and appetite still poor.  Diarrhea much improved 2. Vol overload sec poor dialysis 3. ESRD 4. Anemia start aranesp  Plan: 1. HD in AM.  I think she should be on HD for at least 2-3 weeks and then re assess appropriateness of returning to PD with her nephrologist.  I spoke to her PD today and in fact her adequacies had been low and she was to do an extra exchange per day but did not do that.  She can be DC when we have an outpt spot and she is able to eat. 2. Start aranesp   [name] T

## 2013-10-27 LAB — CBC
HEMATOCRIT: 31 % — AB (ref 36.0–46.0)
Hemoglobin: 9.5 g/dL — ABNORMAL LOW (ref 12.0–15.0)
MCH: 30.1 pg (ref 26.0–34.0)
MCHC: 30.6 g/dL (ref 30.0–36.0)
MCV: 98.1 fL (ref 78.0–100.0)
Platelets: 109 10*3/uL — ABNORMAL LOW (ref 150–400)
RBC: 3.16 MIL/uL — AB (ref 3.87–5.11)
RDW: 18 % — ABNORMAL HIGH (ref 11.5–15.5)
WBC: 3.3 10*3/uL — AB (ref 4.0–10.5)

## 2013-10-27 LAB — RENAL FUNCTION PANEL
ANION GAP: 12 (ref 5–15)
Albumin: 2.1 g/dL — ABNORMAL LOW (ref 3.5–5.2)
BUN: 20 mg/dL (ref 6–23)
CHLORIDE: 96 meq/L (ref 96–112)
CO2: 26 meq/L (ref 19–32)
Calcium: 7.7 mg/dL — ABNORMAL LOW (ref 8.4–10.5)
Creatinine, Ser: 5.78 mg/dL — ABNORMAL HIGH (ref 0.50–1.10)
GFR, EST AFRICAN AMERICAN: 8 mL/min — AB (ref >90)
GFR, EST NON AFRICAN AMERICAN: 7 mL/min — AB (ref >90)
Glucose, Bld: 143 mg/dL — ABNORMAL HIGH (ref 70–99)
POTASSIUM: 4 meq/L (ref 3.7–5.3)
Phosphorus: 5.3 mg/dL — ABNORMAL HIGH (ref 2.3–4.6)
Sodium: 134 mEq/L — ABNORMAL LOW (ref 137–147)

## 2013-10-27 LAB — GLUCOSE, CAPILLARY
GLUCOSE-CAPILLARY: 95 mg/dL (ref 70–99)
GLUCOSE-CAPILLARY: 111 mg/dL — AB (ref 70–99)
Glucose-Capillary: 154 mg/dL — ABNORMAL HIGH (ref 70–99)

## 2013-10-27 MED ORDER — METOCLOPRAMIDE HCL 5 MG/ML IJ SOLN
5.0000 mg | Freq: Three times a day (TID) | INTRAMUSCULAR | Status: DC
Start: 2013-10-27 — End: 2013-10-29
  Administered 2013-10-27 – 2013-10-29 (×6): 5 mg via INTRAVENOUS
  Filled 2013-10-27 (×2): qty 2
  Filled 2013-10-27 (×6): qty 1

## 2013-10-27 MED ORDER — DARBEPOETIN ALFA-POLYSORBATE 60 MCG/0.3ML IJ SOLN
INTRAMUSCULAR | Status: AC
  Filled 2013-10-27: qty 0.3

## 2013-10-27 NOTE — Progress Notes (Signed)
[facility] PROGRESS NOTE  [name] VOZ:[id number] DOB: 1945-10-31 DOA: 10/23/2013 PCP: Shirline Frees, MD  Assessment/Plan: Diarrhea - resolved - negative culture  - ct abd/pelvis unremarkable  Thrush -nysatin swish and swallow -swallowing improved   Hypokalemia - replete - Mg 1.5 - replaced  CAD, NATIVE VESSEL  - stable   Chronic diastolic heart failure  - Mild CHF on Chest X-ray, Resolve with Dialysis Rx  -wean O2 as tolerated- wears 2L at night only  ESRD (end stage renal disease)  - renal -PD changed to HD -s/p HD   Type II or unspecified type diabetes mellitus with renal manifestations, uncontrolled  - SSI coverage PRN,   -HbA1c. 7.3   Peritoneal dialysis status  - on hold for HD  Pulmonary edema, noncardiac from under PD - Per above  Appetite loss- due to possible thrush.  - Supportive Rx, monitor Sxs.  -ask GI to see for possible EGD-needed surveillance EGD about 1 month ago and c/o same symptoms -will make NPO for now  Nausea with vomiting  - Anti-Emetics PRN.     Code Status: Full Family Communication: Pt and family at bedside  Disposition Plan: home once eating   Consultants:  Nephrology  Procedures:    Antibiotics:    HPI/Subjective: Swallowing better but still with pain that feels like her ulcer did in April   Objective: Filed Vitals:   10/27/13 0730 10/27/13 0800 10/27/13 0830 10/27/13 0900  BP: 89/42 95/51 91/40  108/50  Pulse: 60 57 60 63  Temp:      TempSrc:      Resp:  17    Height:      Weight:      SpO2:       No intake or output data in the 24 hours ending 10/27/13 0931 Filed Weights   10/26/13 2148 10/27/13 0500 10/27/13 0650  Weight: 70.9 kg (156 lb 4.9 oz) 70.9 kg (156 lb 4.9 oz) 71.5 kg (157 lb 10.1 oz)    Exam:   General:  Awake, in nad  Cardiovascular: regular, s1, s2  Respiratory: normal resp effort, no wheezing  Abdomen: soft, nondistended  Musculoskeletal: perfused, no clubbing    Data Reviewed: Basic Metabolic Panel:  Recent Labs Lab 10/23/13 2055 10/24/13 0548 10/25/13 0800 10/27/13 0645  NA 137 137 137 134*  K 2.9* 3.6* 3.2* 4.0  CL 92* 94* 95* 96  CO2 27 27 27 26   GLUCOSE 99 99 135* 143*  BUN 25* 27* 29* 20  CREATININE 7.92* 8.60* 8.31* 5.78*  CALCIUM 8.6 8.2* 7.9* 7.7*  MG  --  1.5  --   --   PHOS  --   --   --  5.3*   Liver Function Tests:  Recent Labs Lab 10/23/13 2055 10/27/13 0645  AST 19  --   ALT 15  --   ALKPHOS 86  --   BILITOT 0.4  --   PROT 7.1  --   ALBUMIN 2.3* 2.1*   No results found for this basename: LIPASE, AMYLASE,  in the last 168 hours No results found for this basename: AMMONIA,  in the last 168 hours CBC:  Recent Labs Lab 10/23/13 2055 10/24/13 0548 10/25/13 0800 10/27/13 0645  WBC 5.4 5.9 4.6 3.3*  NEUTROABS 4.4  --   --   --   HGB 11.6* 10.5* 9.8* 9.5*  HCT 37.4 34.4* 32.3* 31.0*  MCV 97.7 96.1 97.3 98.1  PLT 179 180 152 109*   Cardiac Enzymes: No  results found for this basename: CKTOTAL, CKMB, CKMBINDEX, TROPONINI,  in the last 168 hours BNP (last 3 results) No results found for this basename: PROBNP,  in the last 8760 hours CBG:  Recent Labs Lab 10/26/13 0726 10/26/13 1138 10/26/13 1637 10/26/13 1937 10/26/13 2145  GLUCAP 112* 127* 161* 177* 200*    Recent Results (from the past 240 hour(s))  STOOL CULTURE     Status: None   Collection Time    10/24/13  7:55 AM      Result Value Ref Range Status   Specimen Description PERIRECTAL   Final   Special Requests Normal   Final   Culture     Final   Value: Culture reincubated for better growth     Performed at [facility] PEND[facility]   Incomplete     Studies: No results found.  Scheduled Meds: . carvedilol  3.125 mg Oral BID WC  . darbepoetin      . darbepoetin (ARANESP) injection - DIALYSIS  60 mcg Intravenous Q Wed-HD  . enoxaparin (LOVENOX) injection  30 mg Subcutaneous Q24H  . insulin aspart  0-5 Units  Subcutaneous QHS  . insulin aspart  0-9 Units Subcutaneous TID WC  . multivitamin  1 tablet Oral BH-q7a  . nystatin  5 mL Oral QID  . pantoprazole  40 mg Oral BID  . sodium chloride  3 mL Intravenous Q12H   Continuous Infusions:   Principal Problem:   Diarrhea Active Problems:   CAD, NATIVE VESSEL   Chronic diastolic heart failure   ESRD on peritoneal dialysis   Type II or unspecified type diabetes mellitus with renal manifestations, uncontrolled   Hypokalemia   Pulmonary edema, noncardiac   Appetite loss   Nausea with vomiting  Time spent: 39min  [name], [place] [facility] Pager [phone]. If 7PM-7AM, please contact night-coverage at [email or web address], password [facility] 10/27/2013, 9:31 AM  LOS: 4 days

## 2013-10-27 NOTE — Clinical Documentation Improvement (Signed)
Presents with Diarrhea, ESRD, Pulmonary Edema (Acuity not documented).  Patient with abnormal lab values:   Leukopenic WBC = 3.3  Anemic = H&H 9.5 / 31  Thrombocytopenic = Platelets 109  Please provide a diagnosis associated with the above abnormal lab values and document findings in next Progress Note and Discharge Summary to enhance Risk of Mortality Scores.  Thank You, Zoila Shutter ,RN Clinical Documentation Specialist:  [facility]

## 2013-10-27 NOTE — Procedures (Signed)
Pt seen on HD.  Ap 180 Vp 150.  BFR 400.  Appetite remains poor.  She has an outpt spot at [facility] TTS 2nd shift.  Will plan HD again tomorrow to get on TTS schedule and to see if more HD will improve appetite. Once she can eat then she could be DC'd

## 2013-10-28 LAB — STOOL CULTURE: SPECIAL REQUESTS: NORMAL

## 2013-10-28 LAB — GLUCOSE, CAPILLARY
GLUCOSE-CAPILLARY: 107 mg/dL — AB (ref 70–99)
GLUCOSE-CAPILLARY: 203 mg/dL — AB (ref 70–99)
GLUCOSE-CAPILLARY: 213 mg/dL — AB (ref 70–99)

## 2013-10-28 LAB — POTASSIUM: Potassium: 3.8 mEq/L (ref 3.7–5.3)

## 2013-10-28 NOTE — Progress Notes (Signed)
Received notice from [facility] stating that there is an increase in frequency of bigeminy PVCs; paged Dr. Eliseo Squires to make her aware. Pt not experiencing any issues. Lab draws ordered by Dr. Eliseo Squires. Will continue to monitor.

## 2013-10-28 NOTE — Procedures (Signed)
Pt seen on HD.  Ap 170 Vp 140.  BFR 400. On 2k.  Says she feels a little better.  Will need to make sure her PD cath is flushed once a week.

## 2013-10-28 NOTE — Progress Notes (Addendum)
[facility] PROGRESS NOTE  LUDELL ZACARIAS UKG:[id number] DOB: [birth year] DOA: 10/23/2013 PCP: Shirline Frees, MD  [name] is a 68 y.o. female with a history of ESRD on PD, CAD, DM2, HTN admitted with complaints of nausea and diarrhea x 1 month and worse over the past 5 days. She reports having a poor appetite as well. This was suspected due to under-PD and uremia.  Patient was placed on HD but appetite was slow to improve.  Patient did improve once thrush was treated.  Plan to d/c on several weeks of HD  Assessment/Plan: Diarrhea - resolved - negative culture  - ct abd/pelvis unremarkable  Thrush -nysatin swish and swallow -swallowing improved, appetite improved per patient   Hypokalemia - replete - Mg 1.5 - replaced  CAD, NATIVE VESSEL  - stable   Chronic diastolic heart failure  - Mild CHF on Chest X-ray, Resolve with Dialysis Rx  -wean O2 as tolerated- wears 2L at night only  ESRD (end stage renal disease)  - renal -PD changed to HD -s/p HD : outpt spot at [facility] TTS 2nd shift   Type II or unspecified type diabetes mellitus with renal manifestations, uncontrolled  - SSI coverage PRN,   -HbA1c. 7.3   Peritoneal dialysis status  - on hold for HD  Pulmonary edema, noncardiac from under PD - Per above  Appetite loss- due to possible thrush.  - Supportive Rx, monitor Sxs.  -spoke with Dr. Collene Mares about possible EGD while in hospital, she recommend to referral back to [facility] and Dr. Oneida Alar for surveillance EGD from previous gastric ulcer  Nausea with vomiting  - Anti-Emetics PRN.  -added reglan- low dose   Code Status: Full Family Communication: Pt and family at bedside  Disposition Plan: home once eating- home for AM?   Consultants:  Nephrology  Procedures:    Antibiotics:    HPI/Subjective: thinks appetite has improved   Objective: Filed Vitals:   10/28/13 0745 10/28/13 0800 10/28/13 0830 10/28/13 0900  BP: 117/53  105/40 105/43 90/44  Pulse: 63 69 62 64  Temp:      TempSrc:      Resp: 25 28    Height:      Weight:      SpO2:        Intake/Output Summary (Last 24 hours) at 10/28/13 0907 Last data filed at 10/28/13 7628  Gross per 24 hour  Intake    120 ml  Output   2000 ml  Net  -1880 ml   Filed Weights   10/27/13 2100 10/28/13 0500 10/28/13 0740  Weight: 70.308 kg (155 lb) 70.308 kg (155 lb) 69.4 kg (153 lb)    Exam:   General:  Awake, in nad  Cardiovascular: regular, s1, s2  Respiratory: normal resp effort, no wheezing  Abdomen: soft, nondistended  Musculoskeletal: perfused, no clubbing   Data Reviewed: Basic Metabolic Panel:  Recent Labs Lab 10/23/13 2055 10/24/13 0548 10/25/13 0800 10/27/13 0645  NA 137 137 137 134*  K 2.9* 3.6* 3.2* 4.0  CL 92* 94* 95* 96  CO2 27 27 27 26   GLUCOSE 99 99 135* 143*  BUN 25* 27* 29* 20  CREATININE 7.92* 8.60* 8.31* 5.78*  CALCIUM 8.6 8.2* 7.9* 7.7*  MG  --  1.5  --   --   PHOS  --   --   --  5.3*   Liver Function Tests:  Recent Labs Lab 10/23/13 2055 10/27/13 0645  AST 19  --  ALT 15  --   ALKPHOS 86  --   BILITOT 0.4  --   PROT 7.1  --   ALBUMIN 2.3* 2.1*   No results found for this basename: LIPASE, AMYLASE,  in the last 168 hours No results found for this basename: AMMONIA,  in the last 168 hours CBC:  Recent Labs Lab 10/23/13 2055 10/24/13 0548 10/25/13 0800 10/27/13 0645  WBC 5.4 5.9 4.6 3.3*  NEUTROABS 4.4  --   --   --   HGB 11.6* 10.5* 9.8* 9.5*  HCT 37.4 34.4* 32.3* 31.0*  MCV 97.7 96.1 97.3 98.1  PLT 179 180 152 109*   Cardiac Enzymes: No results found for this basename: CKTOTAL, CKMB, CKMBINDEX, TROPONINI,  in the last 168 hours BNP (last 3 results) No results found for this basename: PROBNP,  in the last 8760 hours CBG:  Recent Labs Lab 10/26/13 1937 10/26/13 2145 10/27/13 1152 10/27/13 1719 10/27/13 2108  GLUCAP 177* 200* 95 111* 154*    Recent Results (from the past 240  hour(s))  STOOL CULTURE     Status: None   Collection Time    10/24/13  7:55 AM      Result Value Ref Range Status   Specimen Description PERIRECTAL   Final   Special Requests Normal   Final   Culture     Final   Value: NO SALMONELLA, SHIGELLA, CAMPYLOBACTER, YERSINIA, OR E.COLI 0157:H7 ISOLATED     Performed at [facility]   Report Status 10/28/2013 FINAL   Final     Studies: No results found.  Scheduled Meds: . carvedilol  3.125 mg Oral BID WC  . darbepoetin (ARANESP) injection - DIALYSIS  60 mcg Intravenous Q Wed-HD  . enoxaparin (LOVENOX) injection  30 mg Subcutaneous Q24H  . insulin aspart  0-5 Units Subcutaneous QHS  . insulin aspart  0-9 Units Subcutaneous TID WC  . metoCLOPramide (REGLAN) injection  5 mg Intravenous 3 times per day  . multivitamin  1 tablet Oral BH-q7a  . nystatin  5 mL Oral QID  . pantoprazole  40 mg Oral BID  . sodium chloride  3 mL Intravenous Q12H   Continuous Infusions:   Principal Problem:   Diarrhea Active Problems:   CAD, NATIVE VESSEL   Chronic diastolic heart failure   ESRD on peritoneal dialysis   Type II or unspecified type diabetes mellitus with renal manifestations, uncontrolled   Hypokalemia   Pulmonary edema, noncardiac   Appetite loss   Nausea with vomiting  Time spent: 71min  Tyquarius Paglia, [place] [facility] Pager [phone]. If 7PM-7AM, please contact night-coverage at [email or web address], password [facility] 10/28/2013, 9:07 AM  LOS: 5 days

## 2013-10-29 LAB — CBC
HCT: 32.6 % — ABNORMAL LOW (ref 36.0–46.0)
HEMOGLOBIN: 9.7 g/dL — AB (ref 12.0–15.0)
MCH: 30.2 pg (ref 26.0–34.0)
MCHC: 29.8 g/dL — AB (ref 30.0–36.0)
MCV: 101.6 fL — AB (ref 78.0–100.0)
Platelets: 124 10*3/uL — ABNORMAL LOW (ref 150–400)
RBC: 3.21 MIL/uL — AB (ref 3.87–5.11)
RDW: 18.5 % — ABNORMAL HIGH (ref 11.5–15.5)
WBC: 4.5 10*3/uL (ref 4.0–10.5)

## 2013-10-29 LAB — BASIC METABOLIC PANEL
Anion gap: 12 (ref 5–15)
BUN: 5 mg/dL — AB (ref 6–23)
CALCIUM: 7.5 mg/dL — AB (ref 8.4–10.5)
CO2: 27 meq/L (ref 19–32)
Chloride: 99 mEq/L (ref 96–112)
Creatinine, Ser: 2.26 mg/dL — ABNORMAL HIGH (ref 0.50–1.10)
GFR calc Af Amer: 24 mL/min — ABNORMAL LOW (ref >90)
GFR calc non Af Amer: 21 mL/min — ABNORMAL LOW (ref >90)
Glucose, Bld: 152 mg/dL — ABNORMAL HIGH (ref 70–99)
Potassium: 4 mEq/L (ref 3.7–5.3)
Sodium: 138 mEq/L (ref 137–147)

## 2013-10-29 LAB — GLUCOSE, CAPILLARY
Glucose-Capillary: 168 mg/dL — ABNORMAL HIGH (ref 70–99)
Glucose-Capillary: 115 mg/dL — ABNORMAL HIGH (ref 70–99)

## 2013-10-29 LAB — MAGNESIUM: Magnesium: 1.8 mg/dL (ref 1.5–2.5)

## 2013-10-29 MED ORDER — ONDANSETRON HCL 4 MG PO TABS: 4.0000 mg | ORAL_TABLET | Freq: Four times a day (QID) | ORAL | Status: DC | PRN

## 2013-10-29 MED ORDER — INSULIN ASPART PROT & ASPART (70-30 MIX) 100 UNIT/ML ~~LOC~~ SUSP: 5.0000 [IU] | Freq: Every day | SUBCUTANEOUS | Status: DC

## 2013-10-29 MED ORDER — INSULIN ASPART PROT & ASPART (70-30 MIX) 100 UNIT/ML ~~LOC~~ SUSP: 10.0000 [IU] | Freq: Every day | SUBCUTANEOUS | Status: DC

## 2013-10-29 MED ORDER — DOXYCYCLINE HYCLATE 100 MG PO TABS: 100.0000 mg | ORAL_TABLET | Freq: Two times a day (BID) | ORAL | Status: AC

## 2013-10-29 MED ORDER — NYSTATIN 100000 UNIT/ML MT SUSP: 5.0000 mL | Freq: Four times a day (QID) | OROMUCOSAL | Status: DC

## 2013-10-29 NOTE — Discharge Summary (Signed)
Physician Discharge Summary  [name] JYN:[id number] DOB: 11-Jan-1946 DOA: 10/23/2013  PCP: Shirline Frees, MD  Admit date: 10/23/2013 Discharge date: 10/29/2013  Recommendations for Outpatient Follow-up:  1. Pt will need to follow up with PCP in 1-2 weeks post discharge 2. Please obtain BMP to evaluate electrolytes and kidney function 3. Please also check CBC to evaluate Hg and Hct levels   Discharge Diagnoses:  Principal Problem:   Appetite loss Active Problems:   CAD, NATIVE VESSEL   Chronic diastolic heart failure   ESRD on peritoneal dialysis   Type II or unspecified type diabetes mellitus with renal manifestations, uncontrolled   Hypokalemia   Diarrhea   Pulmonary edema, noncardiac   Nausea with vomiting   Discharge Condition: Stable  Diet recommendation: Heart healthy diet   History of present illness:  From H&P by Dr. Michela Pitcher is a 68 y.o. female with a history of ESRD on PD, CAD, DM2, HTN who presents tot he ED with complaints of nausea and diarrhea x 1 month and worse over the past 5 days. She denies any fevers or chills. She does report having indigestion like symptoms and heartburn. She reports having a poor appetite as well.    Hospital Course:   Appetite loss/Nausea with vomiting with thrush: Appetite loss was multifactorial, thought to be due to thrush and uremia from under dialysis with peritoneal dialysis.  She was started on HD while in house and she slowly improved.  She was also started on nystatin swish and swallow and briefly on megace and all of these interventions helped improve her appetite.  On day of discharge, she was close to baseline and wished to return home to continue to recuperate.  The case was discussed with GI, however, it was felt more appropriate to have outpatient evaluation and return to her outpatient GI doctor for further EGD if necessary.  She was discharged on nystatin s/sw for 7 more days and an anti-emetic as needed.    Hypokalemia: possibly due to low appetite, was repleted and back to baseline on discharge.  Her magnesium was also low and this was replaced.   ESRD on peritoneal dialysis: Nephrology was consulted and she was transitioned to HD.  She tolerated this procedure well and was set up with outpatient HD on TTS in [place].  Per nephrology notes, she can be on HD for 2-3 weeks, then reconsidered for PD.      Chronic diastolic heart failure: mild pulmonary edema on xray, thought to be more related to renal function.  Improved with HD.    Type II or unspecified type diabetes mellitus with renal manifestations, uncontrolled: Was on SSI while in the hospital.  On return home, she was initiated on half of her home insulin dose, 10 of NPH in the morning and 5 at night.  This was due to her low appetite.  This can quickly be uptitrated back to her normal dose when her eating returns completely to baseline.     Diarrhea: Resolved while in house, culture was negative and a CT of her abdomen was unremarkable for a cause.     Pulmonary edema, noncardiac: Improved with transition to HD.     Procedures/Studies: Ct Abdomen Pelvis Wo Contrast  10/24/2013   CLINICAL DATA:  Suspected colitis.  EXAM: CT ABDOMEN AND PELVIS WITHOUT CONTRAST  TECHNIQUE: Multidetector CT imaging of the abdomen and pelvis was performed following the standard protocol without IV contrast.  COMPARISON:  CT abdomen 02/04/2012  FINDINGS: Small bilateral pleural effusions with bibasilar atelectasis. Cardiac enlargement with extensive coronary artery calcification.  Mild pneumoperitoneum felt to be related to peritoneal dialysis. Peritoneal dialysis catheter coiled in the right lower quadrant. Small amount of free fluid in the abdomen consistent with dialysis.  Liver spleen and pancreas are normal. Left kidney is absent. Extensive right renal artery calcification. Atherosclerotic aorta without aneurysm.  Negative for bowel obstruction. No bowel  thickening or inflammation identified. No mass or adenopathy is present. Lumbar fusion L5-S1.  IMPRESSION: Mild pneumoperitoneum consistent with peritoneal dialysis.  Negative for bowel obstruction or bowel thickening. No evidence of colonic thickening.   Electronically Signed   By: Franchot Gallo M.D.   On: 10/24/2013 20:39   Dg Chest 2 View  10/24/2013   CLINICAL DATA:  Shortness of breath.  EXAM: CHEST  2 VIEW  COMPARISON:  Portable chest x-rays 08/22/2013 dating back to 08/19/2013. Two-view chest x-ray 06/27/2013.  FINDINGS: Prior sternotomy for aortic valve replacement. Cardiac silhouette markedly enlarged but stable. Mild diffuse interstitial pulmonary edema. Bilateral pleural effusions, moderately large on the right and small on the left. Associated consolidation in the lower lobes.  IMPRESSION: Mild CHF, with stable marked cardiomegaly, mild diffuse interstitial pulmonary edema and bilateral pleural effusions. Associated passive atelectasis in the lower lobes.   Electronically Signed   By: Evangeline Dakin M.D.   On: 10/24/2013 00:59    Consultations:  Nephrology  Antibiotics:  none  Discharge Exam: Filed Vitals:   10/29/13 1000  BP: 114/77  Pulse: 75  Temp: 97.5 F (36.4 C)  Resp: 20   Filed Vitals:   10/28/13 2106 10/29/13 0548 10/29/13 0824 10/29/13 1000  BP: 106/59 102/57 114/63 114/77  Pulse: 72 62 66 75  Temp: 98.7 F (37.1 C) 98.2 F (36.8 C)  97.5 F (36.4 C)  TempSrc: Oral Oral  Oral  Resp: 20 20  20   Height:      Weight: 147 lb 4.3 oz (66.801 kg)     SpO2: 94% 95%  95%    General: Thin woman, NAD, alert and oriented  Cardiovascular: Regular rate and rhythm, S1/S2  Respiratory: Clear to auscultation bilaterally, no wheezing, minimal crackles at bases  Abdomen: Soft, non tender, non distended, bowel sounds present, PD catheter in place  Extremities: No edema or pain, fistula in RUE with good bruit and thrill  Neuro: Grossly nonfocal   Discharge  Instructions     Medication List         ALPRAZolam 0.5 MG tablet  Commonly known as:  XANAX  Take 0.5 mg by mouth at bedtime as needed for anxiety.     carvedilol 3.125 MG tablet  Commonly known as:  COREG  Take 3.125 mg by mouth 2 (two) times daily with a meal.     doxycycline 100 MG tablet  Commonly known as:  VIBRA-TABS  Take 1 tablet (100 mg total) by mouth 2 (two) times daily. Resume previous outpatient prescription     EPINEPHrine 0.3 mg/0.3 mL Soaj injection  Commonly known as:  EPI-PEN  Inject 0.3 mg into the muscle daily as needed (allergic reaction).     insulin aspart protamine- aspart (70-30) 100 UNIT/ML injection  Commonly known as:  NOVOLOG MIX 70/30  Inject 0.1 mLs (10 Units total) into the skin daily with breakfast.     insulin aspart protamine- aspart (70-30) 100 UNIT/ML injection  Commonly known as:  NOVOLOG MIX 70/30  Inject 0.05 mLs (5 Units total) into the skin daily  with supper.     meclizine 25 MG tablet  Commonly known as:  ANTIVERT  Take 50 mg by mouth 2 (two) times daily as needed for dizziness.     multivitamin Tabs tablet  Take 1 tablet by mouth every morning.     nystatin 100000 UNIT/ML suspension  Commonly known as:  MYCOSTATIN  Take 5 mLs (500,000 Units total) by mouth 4 (four) times daily. For 7 days     ondansetron 4 MG tablet  Commonly known as:  ZOFRAN  Take 1 tablet (4 mg total) by mouth every 6 (six) hours as needed for nausea.     pantoprazole 40 MG tablet  Commonly known as:  PROTONIX  Take 1 tablet (40 mg total) by mouth 2 (two) times daily.       Follow-up Information   Follow up with Shirline Frees, MD. Call in 3 days.   Specialty:  Family Medicine   Contact information:   [place] [address] [phone]       Follow up with Milus Banister, MD. Call in 3 days. (GI appointment. )    Specialty:  Gastroenterology   Contact information:   [address] [place]  [address] [phone]        The results of significant diagnostics from this hospitalization (including imaging, microbiology, ancillary and laboratory) are listed below for reference.     Microbiology: Recent Results (from the past 240 hour(s))  STOOL CULTURE     Status: None   Collection Time    10/24/13  7:55 AM      Result Value Ref Range Status   Specimen Description PERIRECTAL   Final   Special Requests Normal   Final   Culture     Final   Value: NO SALMONELLA, SHIGELLA, CAMPYLOBACTER, YERSINIA, OR E.COLI 0157:H7 ISOLATED     Performed at [facility]   Report Status 10/28/2013 FINAL   Final     Labs: Basic Metabolic Panel:  Recent Labs Lab 10/23/13 2055 10/24/13 0548 10/25/13 0800 10/27/13 0645 10/28/13 1639 10/29/13 0025  NA 137 137 137 134*  --  138  K 2.9* 3.6* 3.2* 4.0 3.8 4.0  CL 92* 94* 95* 96  --  99  CO2 27 27 27 26   --  27  GLUCOSE 99 99 135* 143*  --  152*  BUN 25* 27* 29* 20  --  5*  CREATININE 7.92* 8.60* 8.31* 5.78*  --  2.26*  CALCIUM 8.6 8.2* 7.9* 7.7*  --  7.5*  MG  --  1.5  --   --   --  1.8  PHOS  --   --   --  5.3*  --   --    Liver Function Tests:  Recent Labs Lab 10/23/13 2055 10/27/13 0645  AST 19  --   ALT 15  --   ALKPHOS 86  --   BILITOT 0.4  --   PROT 7.1  --   ALBUMIN 2.3* 2.1*   No results found for this basename: LIPASE, AMYLASE,  in the last 168 hours No results found for this basename: AMMONIA,  in the last 168 hours CBC:  Recent Labs Lab 10/23/13 2055 10/24/13 0548 10/25/13 0800 10/27/13 0645 10/29/13 0025  WBC 5.4 5.9 4.6 3.3* 4.5  NEUTROABS 4.4  --   --   --   --   HGB 11.6* 10.5* 9.8* 9.5* 9.7*  HCT 37.4 34.4* 32.3* 31.0*  32.6*  MCV 97.7 96.1 97.3 98.1 101.6*  PLT 179 180 152 109* 124*   Cardiac Enzymes: No results found for this basename: CKTOTAL, CKMB, CKMBINDEX, TROPONINI,  in the last 168 hours BNP: BNP (last 3 results) No results found for this basename: PROBNP,  in the last 8760  hours CBG:  Recent Labs Lab 10/28/13 1315 10/28/13 1652 10/28/13 2120 10/29/13 0745 10/29/13 1202  GLUCAP 107* 203* 213* 168* 115*     SIGNED: Time coordinating discharge: 40 minutes  Nima Kemppainen, MD  [facility] 10/29/2013, 5:38 PM Pager [phone]  If 7PM-7AM, please contact night-coverage [email or web address] Password [email or web address]

## 2013-10-29 NOTE — Progress Notes (Signed)
Patient to be discharged to home. Discharge instructions and medications reviewed with patient. PIV removed. Telemetry box #25 removed and returned to nurse's station. All belongings returned to patient.  Joellen Jersey, RN.

## 2013-10-29 NOTE — Care Management Note (Signed)
CARE MANAGEMENT NOTE 10/29/2013  Patient:  [name],[name]   Account Number:  [id number]  Date Initiated:  10/29/2013  Documentation initiated by:  Dajahnae Vondra  Subjective/Objective Assessment:     Action/Plan:   Met with pt and husband no d/c needs identified.   Anticipated DC Date:  10/29/2013   Anticipated DC Plan:  HOME/SELF CARE         Choice offered to / List presented to:             Status of service:  Completed, signed off Medicare Important Message given?  YES (If response is "NO", the following Medicare IM given date fields will be blank) Date Medicare IM given:  10/29/2013 Medicare IM given by:  Teleshia Lemere Date Additional Medicare IM given:   Additional Medicare IM given by:    Discharge Disposition:  HOME/SELF CARE  Per UR Regulation:    If discussed at Long Length of Stay Meetings, dates discussed:    Comments:

## 2013-10-29 NOTE — Discharge Instructions (Addendum)
Ms. Bostic - -   You were admitted for nausea, vomiting and worsening renal function.  You were started on hemodialysis in place of your peritoneal dialysis.  You were also found to have thrush (fungal infection in your throat) which was making it difficult to swallow.  Since being started on  Hemodialysis, your kidney function is improved.  You have an anti-fungal medication, nystatin, which has improved your thrush as well.   When you go home, please decrease your insulin dose to half of what you were taking until you see your primary care provider Dr. Kenton Kingfisher.  Please only take 10 units in the morning and 5 units at night.    Please make an appointment with the GI doctor you have seen before to discuss having an EGD (scope of your throat and esophagus).    You will be given prescriptions for nystatin swish and swallow, take this for another 7 days or until your PCP tells you you don't need to take it.  You will also be given a anti nausea medication.    You will have Hemodialysis on Tuesday, Thursday and Saturday in [place].  Your first session should be tomorrow.   Please return to the hospital for fevers, chills, worsening nausea, vomiting, severe diarrhea, severe pain, chest pain.

## 2013-10-29 NOTE — Progress Notes (Signed)
[facility] PROGRESS NOTE  [name] XVQ:[id number] DOB: Jun 02, 1945 DOA: 10/23/2013 PCP: [name] Frees, MD  Brief narrative: [name] is a 68 y.o. female with a history of ESRD on PD, CAD, DM2, HTN admitted with complaints of nausea and diarrhea x 1 month and worse over the preceding 5 days. She reports having a poor appetite as well. This was thought to be due to under-PD and uremia. Patient was placed on HD but appetite has been slow to improve. Patient did start to improve once thrush was treated. Plan to d/c on several weeks of HD when ready.  Assessment/Plan  Appetite loss with N/V with thrush - Nystatin sw/sw, can continue as an outpatient for 1 week (7- 14 days total) - Dr. Eliseo [name] spoke with Dr. Collene [name] (GI) about possible EGD while in hospital, she recommended referral back to [facility] and Dr. Oneida [name] for surveillance EGD from previous gastric ulcer, patient and family aware of need to schedule - Anti-Emetics PRN.  - Low dose reglan, while in house, will not get on D/C  ESRD on peritoneal dialysis, now on HD with associated anemia - renal following - s/p HD : outpt spot at [facility] 2nd shift - PD catheter needs to be flushed weekly  Type II or unspecified type diabetes mellitus with renal manifestations, uncontrolled - Has been using SSI prn while in house - A1C 7.3 while in house - CBG ranging from 160s to low 200s - She is on novolog 70/30 at home with 20 units in the AM and 10 units at night.  - Will d/c on 10  Units in the AM and 5 units at night until her PO intake is back to normal and she sees her primary care provider  Hypokalemia - Resolved, K is 4 today  Diarrhea - Resolved, negative culture - CT abd/pelvis on 10/24/13 unremarkable  CAD, NATIVE VESSEL - Stable, no chest pain    Chronic diastolic heart failure - Stable, she had some pulmonary edema, but this was from under PD and has improved with HD  Thrombocytopenia/Leukopenia -  Leukopenia improved to baseline today, possibly due to error in lab - Thrombocytopenia also improved today, possibly due to HD, course does not seem related to heparin use; she is on lovenox for prophylaxis. Will need repeat CBC in outpatient setting to further evaluate.  (low probability of HIT based on 4T's score of 2)    Consultants:  Nephrology  Procedures/Studies:  No new  Antibiotics:  Nystatin swish and swallow  Code Status: Full Family Communication: Pt and family at bedside Disposition Plan: Home when medically stable, likely today  HPI/Subjective: No events overnight.  She stated that she ate her breakfast well and feels better today.   Objective: Filed Vitals:   10/28/13 1755 10/28/13 2106 10/29/13 0548 10/29/13 0824  BP: 122/50 106/59 102/57 114/63  Pulse: 62 72 62 66  Temp: 98 F (36.7 C) 98.7 F (37.1 C) 98.2 F (36.8 C)   TempSrc: Oral Oral Oral   Resp: 22 20 20    Height:      Weight:  147 lb 4.3 oz (66.801 kg)    SpO2: 100% 94% 95%     Intake/Output Summary (Last 24 hours) at 10/29/13 0846 Last data filed at 10/28/13 2011  Gross per 24 hour  Intake    720 ml  Output   1000 ml  Net   -280 ml    Exam:   General: Thin woman, NAD, alert and oriented  Cardiovascular: Regular rate and rhythm, S1/S2  Respiratory: Clear to auscultation bilaterally, no wheezing, minimal crackles at bases  Abdomen: Soft, non tender, non distended, bowel sounds present, PD catheter in place  Extremities: No edema or pain, fistula in RUE with good bruit and thrill  Neuro: Grossly nonfocal  Data Reviewed: Basic Metabolic Panel:  Recent Labs Lab 10/23/13 2055 10/24/13 0548 10/25/13 0800 10/27/13 0645 10/28/13 1639 10/29/13 0025  NA 137 137 137 134*  --  138  K 2.9* 3.6* 3.2* 4.0 3.8 4.0  CL 92* 94* 95* 96  --  99  CO2 27 27 27 26   --  27  GLUCOSE 99 99 135* 143*  --  152*  BUN 25* 27* 29* 20  --  5*  CREATININE 7.92* 8.60* 8.31* 5.78*  --  2.26*   CALCIUM 8.6 8.2* 7.9* 7.7*  --  7.5*  MG  --  1.5  --   --   --  1.8  PHOS  --   --   --  5.3*  --   --    Liver Function Tests:  Recent Labs Lab 10/23/13 2055 10/27/13 0645  AST 19  --   ALT 15  --   ALKPHOS 86  --   BILITOT 0.4  --   PROT 7.1  --   ALBUMIN 2.3* 2.1*   No results found for this basename: LIPASE, AMYLASE,  in the last 168 hours No results found for this basename: AMMONIA,  in the last 168 hours CBC:  Recent Labs Lab 10/23/13 2055 10/24/13 0548 10/25/13 0800 10/27/13 0645 10/29/13 0025  WBC 5.4 5.9 4.6 3.3* 4.5  NEUTROABS 4.4  --   --   --   --   HGB 11.6* 10.5* 9.8* 9.5* 9.7*  HCT 37.4 34.4* 32.3* 31.0* 32.6*  MCV 97.7 96.1 97.3 98.1 101.6*  PLT 179 180 152 109* 124*   Cardiac Enzymes: No results found for this basename: CKTOTAL, CKMB, CKMBINDEX, TROPONINI,  in the last 168 hours BNP: No components found with this basename: POCBNP,  CBG:  Recent Labs Lab 10/27/13 1719 10/27/13 2108 10/28/13 1315 10/28/13 1652 10/28/13 2120  GLUCAP 111* 154* 107* 203* 213*    Recent Results (from the past 240 hour(s))  STOOL CULTURE     Status: None   Collection Time    10/24/13  7:55 AM      Result Value Ref Range Status   Specimen Description PERIRECTAL   Final   Special Requests Normal   Final   Culture     Final   Value: NO SALMONELLA, SHIGELLA, CAMPYLOBACTER, YERSINIA, OR E.COLI 0157:H7 ISOLATED     Performed at [facility]   Report Status 10/28/2013 FINAL   Final     Scheduled Meds: . carvedilol  3.125 mg Oral BID WC  . darbepoetin (ARANESP) injection - DIALYSIS  60 mcg Intravenous Q Wed-HD  . enoxaparin (LOVENOX) injection  30 mg Subcutaneous Q24H  . insulin aspart  0-5 Units Subcutaneous QHS  . insulin aspart  0-9 Units Subcutaneous TID WC  . metoCLOPramide (REGLAN) injection  5 mg Intravenous 3 times per day  . multivitamin  1 tablet Oral BH-q7a  . nystatin  5 mL Oral QID  . pantoprazole  40 mg Oral BID  . sodium chloride  3  mL Intravenous Q12H   Continuous Infusions:    [name], MD  [place] Pager [phone]  If 7PM-7AM, please contact night-coverage [email or web address] Password [facility] 10/29/2013, 8:46 AM  LOS: 6 days

## 2013-10-29 NOTE — Progress Notes (Signed)
S: feels much better,  Now eating.  Wants to go home O:BP 114/63  Pulse 66  Temp(Src) 98.2 F (36.8 C) (Oral)  Resp 20  Ht 5\' 5"  (1.651 m)  Wt 66.801 kg (147 lb 4.3 oz)  BMI 24.51 kg/m2  SpO2 95%  Intake/Output Summary (Last 24 hours) at 10/29/13 1030 Last data filed at 10/28/13 2011  Gross per 24 hour  Intake    720 ml  Output   1000 ml  Net   -280 ml   Weight change: 0.3 kg (10.6 oz) BJS:EGBTD and alert CVS:RRR Resp:few basilar crackles Abd:+ BS NTND + PD cath Ext: no edema  RUA AVF + bruit NEURO:CNI OX3 no asterixis   . carvedilol  3.125 mg Oral BID WC  . darbepoetin (ARANESP) injection - DIALYSIS  60 mcg Intravenous Q Wed-HD  . enoxaparin (LOVENOX) injection  30 mg Subcutaneous Q24H  . insulin aspart  0-5 Units Subcutaneous QHS  . insulin aspart  0-9 Units Subcutaneous TID WC  . metoCLOPramide (REGLAN) injection  5 mg Intravenous 3 times per day  . multivitamin  1 tablet Oral BH-q7a  . nystatin  5 mL Oral QID  . pantoprazole  40 mg Oral BID  . sodium chloride  3 mL Intravenous Q12H   No results found. BMET    Component Value Date/Time   NA 138 10/29/2013 0025   K 4.0 10/29/2013 0025   CL 99 10/29/2013 0025   CO2 27 10/29/2013 0025   GLUCOSE 152* 10/29/2013 0025   BUN 5* 10/29/2013 0025   BUN 42* 01/02/2012 0919   CREATININE 2.26* 10/29/2013 0025   CREATININE 5.80 01/02/2012 0919   CALCIUM 7.5* 10/29/2013 0025   CALCIUM 8.5 01/12/2007 0545   GFRNONAA 21* 10/29/2013 0025   GFRAA 24* 10/29/2013 0025   CBC    Component Value Date/Time   WBC 4.5 10/29/2013 0025   RBC 3.21* 10/29/2013 0025   RBC 2.25* 01/10/2013 1437   HGB 9.7* 10/29/2013 0025   HCT 32.6* 10/29/2013 0025   PLT 124* 10/29/2013 0025   MCV 101.6* 10/29/2013 0025   MCH 30.2 10/29/2013 0025   MCHC 29.8* 10/29/2013 0025   RDW 18.5* 10/29/2013 0025   LYMPHSABS 0.6* 10/23/2013 2055   MONOABS 0.4 10/23/2013 2055   EOSABS 0.0 10/23/2013 2055   BASOSABS 0.0 10/23/2013 2055     Impression: 1. N/V/D  Suspect N/V due to uremia,  improved 2. Vol overload sec poor dialysis, improved 3. ESRD 4. Anemia on aranesp  Plan: 1. Will notify her dialysis unit of DC and plan for HD in AM   Amori Cooperman T

## 2013-11-01 ENCOUNTER — Telehealth: Payer: Self-pay | Admitting: Gastroenterology

## 2013-11-02 NOTE — Telephone Encounter (Signed)
Message left for the pt to call and schedule a follow up procedure to discuss EGD

## 2013-11-05 ENCOUNTER — Telehealth: Payer: Self-pay | Admitting: Gastroenterology

## 2013-11-05 NOTE — Telephone Encounter (Signed)
Pt has been scheduled for ROV to discuss EGD

## 2013-11-24 ENCOUNTER — Encounter: Payer: Self-pay | Admitting: Nurse Practitioner

## 2013-11-24 ENCOUNTER — Ambulatory Visit (INDEPENDENT_AMBULATORY_CARE_PROVIDER_SITE_OTHER): Payer: Medicare Other | Admitting: Nurse Practitioner

## 2013-11-24 VITALS — BP 136/65 | HR 87 | Temp 97.8°F | Ht 65.0 in | Wt 150.0 lb

## 2013-11-24 DIAGNOSIS — I6340 Cerebral infarction due to embolism of unspecified cerebral artery: Secondary | ICD-10-CM

## 2013-11-24 DIAGNOSIS — I6350 Cerebral infarction due to unspecified occlusion or stenosis of unspecified cerebral artery: Secondary | ICD-10-CM

## 2013-11-24 DIAGNOSIS — I2510 Atherosclerotic heart disease of native coronary artery without angina pectoris: Secondary | ICD-10-CM

## 2013-11-24 NOTE — Patient Instructions (Addendum)
We need the GI doctor to clear you to take an aspirin 81 mg orally every day for secondary stroke prevention.  If agreeable, your cardiologist may recommend a more powerful anticoagulant such as Coumadin, Xarelto, Eliquis or Pradaxa to prevent stroke from your irregular heart rhythm, called atrial fibrillation. Maintain strict control of hypertension with blood pressure goal below 140/90, diabetes with hemoglobin A1c goal below 7% and lipids with LDL cholesterol goal below 70 mg/dL. Followup in the future in 3 months with Dr. Erlinda Hong, sooner as needed.  Stroke Prevention Some medical conditions and behaviors are associated with an increased chance of having a stroke. You may prevent a stroke by making healthy choices and managing medical conditions. HOW CAN I REDUCE MY RISK OF HAVING A STROKE?   Stay physically active. Get at least 30 minutes of activity on most or all days.  Do not smoke. It may also be helpful to avoid exposure to secondhand smoke.  Limit alcohol use. Moderate alcohol use is considered to be:  No more than 2 drinks per day for men.  No more than 1 drink per day for nonpregnant women.  Eat healthy foods. This involves:  Eating 5 or more servings of fruits and vegetables a day.  Making dietary changes that address high blood pressure (hypertension), high cholesterol, diabetes, or obesity.  Manage your cholesterol levels.  Making food choices that are high in fiber and low in saturated fat, trans fat, and cholesterol may control cholesterol levels.  Take any prescribed medicines to control cholesterol as directed by your health care provider.  Manage your diabetes.  Controlling your carbohydrate and sugar intake is recommended to manage diabetes.  Take any prescribed medicines to control diabetes as directed by your health care provider.  Control your hypertension.  Making food choices that are low in salt (sodium), saturated fat, trans fat, and cholesterol is  recommended to manage hypertension.  Take any prescribed medicines to control hypertension as directed by your health care provider.  Maintain a healthy weight.  Reducing calorie intake and making food choices that are low in sodium, saturated fat, trans fat, and cholesterol are recommended to manage weight.  Stop drug abuse.  Avoid taking birth control pills.  Talk to your health care provider about the risks of taking birth control pills if you are over 24 years old, smoke, get migraines, or have ever had a blood clot.  Get evaluated for sleep disorders (sleep apnea).  Talk to your health care provider about getting a sleep evaluation if you snore a lot or have excessive sleepiness.  Take medicines only as directed by your health care provider.  For some people, aspirin or blood thinners (anticoagulants) are helpful in reducing the risk of forming abnormal blood clots that can lead to stroke. If you have the irregular heart rhythm of atrial fibrillation, you should be on a blood thinner unless there is a good reason you cannot take them.  Understand all your medicine instructions.  Make sure that other conditions (such as anemia or atherosclerosis) are addressed. SEEK IMMEDIATE MEDICAL CARE IF:   You have sudden weakness or numbness of the face, arm, or leg, especially on one side of the body.  Your face or eyelid droops to one side.  You have sudden confusion.  You have trouble speaking (aphasia) or understanding.  You have sudden trouble seeing in one or both eyes.  You have sudden trouble walking.  You have dizziness.  You have a loss of balance  or coordination.  You have a sudden, severe headache with no known cause.  You have new chest pain or an irregular heartbeat. Any of these symptoms may represent a serious problem that is an emergency. Do not wait to see if the symptoms will go away. Get medical help at once. Call your local emergency services (911 in [place]  Do not drive yourself to the hospital. Document Released: 04/18/2004 Document Revised: 07/26/2013 Document Reviewed: 09/11/2012 [facility] Patient Information 2015 [place], [place]. This information is not intended to replace advice given to you by your health care provider. Make sure you discuss any questions you have with your health care provider.

## 2013-11-24 NOTE — Progress Notes (Signed)
PATIENT: [name] DOB: 1945-09-30  REASON FOR VISIT: routine follow up for stroke HISTORY FROM: patient, husband  HISTORY OF PRESENT ILLNESS: [name] is a 68 y.o. female who comes to the office for first hospital follow up post hospital discharge for stroke. She has a history of stroke in 2010, no residual deficits. The night of 08/18/2013 she had some slurred speech. Speech subsequently resolved, but then as they're getting into bed again she had garbled speech and husband decided to bring her into the emergency room. She was brought into the ER, and in the ER she was noticed to get significantly worse and became unresponsive for a brief period of time. She became globally aphasic and there is concern briefly that she was not protecting her airway, but her mental status once again improved to the point where she was not intubated. She had open heart surgery done in early March, she had some GI bleeding in early April due to ulcers which were cauterized. Husband reports no issues with bleeding since that time and her hemoglobin here is significantly higher than at discharge. She undergoes peritoneal dialysis. The need for heart surgery in March was apparently found on evaluation for kidney transplant surgery and this surgery was undertaken in anticipation of kidney transplantation. 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 Patient was administered TPA, which did not dissolve clot. She was taken to neuro intervention where she had near complete revascularization of L MCA M2-M3 branches of the superior division of L MCA with IA tPA. MRI imaging confirms Multi focal left frontal and parietal lobes MCA infarcts with a thin post tPA asymptomatic hemorrhage along the falx without significant mass effect. Patient also with small vessel disease. Infarct embolic secondary to new onset atrial fibrillation seen on admitting EKG. On no antithrombotics prior to admission.  Discharged on low dose aspirin, which was stopped since by GI physician, Dr. Buford Dresser. She has repeat EGD scheduled for later this month to follow up on stomach ulcers. She states that she feels that she has fully recovered from stroke.  She gets out of breath with walking short distances, which she states has occurred since her heart surgery earlier this year.  REVIEW OF SYSTEMS: Full 14 system review of systems performed and notable only for:  No complaints.  ALLERGIES: Allergies  Allergen Reactions  . Lisinopril Swelling    HOME MEDICATIONS: Outpatient Prescriptions Prior to Visit  Medication Sig Dispense Refill  . ALPRAZolam (XANAX) 0.5 MG tablet Take 0.5 mg by mouth at bedtime as needed for anxiety.      . carvedilol (COREG) 3.125 MG tablet Take 3.125 mg by mouth 2 (two) times daily with a meal.      . EPINEPHrine 0.3 mg/0.3 mL IJ SOAJ injection Inject 0.3 mg into the muscle daily as needed (allergic reaction).      . insulin aspart protamine- aspart (NOVOLOG MIX 70/30) (70-30) 100 UNIT/ML injection Inject 0.1 mLs (10 Units total) into the skin daily with breakfast.  10 mL  11  . insulin aspart protamine- aspart (NOVOLOG MIX 70/30) (70-30) 100 UNIT/ML injection Inject 0.05 mLs (5 Units total) into the skin daily with supper.  10 mL  11  . meclizine (ANTIVERT) 25 MG tablet Take 50 mg by mouth 2 (two) times daily as needed for dizziness.      . multivitamin (RENA-VIT) TABS tablet Take 1 tablet by mouth every morning.      Marland Kitchen  nystatin (MYCOSTATIN) 100000 UNIT/ML suspension Take 5 mLs (500,000 Units total) by mouth 4 (four) times daily. For 7 days  60 mL  0  . ondansetron (ZOFRAN) 4 MG tablet Take 1 tablet (4 mg total) by mouth every 6 (six) hours as needed for nausea.  20 tablet  0  . pantoprazole (PROTONIX) 40 MG tablet Take 1 tablet (40 mg total) by mouth 2 (two) times daily.  180 tablet  3   No facility-administered medications prior to visit.    PHYSICAL EXAM Filed Vitals:   11/24/13  1539  BP: 136/65  Pulse: 87  Temp: 97.8 F (36.6 C)  TempSrc: Oral  Height: 5\' 5"  (1.651 m)  Weight: 150 lb (68.04 kg)   Body mass index is 24.96 kg/(m^2).  Visual Acuity Screening   Right eye Left eye Both eyes  Without correction: 20/50  20/40   With correction:      Generalized: Well developed, elderly Caucasian female in no acute distress  Head: normocephalic and atraumatic. Oropharynx benign  Neck: Supple, no carotid bruits  Cardiac: Regular rate rhythm, no murmur  Musculoskeletal: No deformity   Neurological examination  Mentation: Alert oriented to time, place, history taking. Follows all commands speech and language fluent Cranial nerve II-XII: Fundoscopic exam not done. Pupils were equal round reactive to light extraocular movements were full, visual field were full on confrontational test. Facial sensation and strength were normal. hearing was intact to finger rubbing bilaterally. Uvula tongue midline. head turning and shoulder shrug and were normal and symmetric.Tongue protrusion into cheek strength was normal. Motor: The motor testing reveals 5 over 5 strength of all 4 extremities. Good symmetric motor tone is noted throughout. Diminished fine finger movements on the right. Orbits left over right upper extremity.  Sensory: Sensory testing is intact to soft touch on all 4 extremities. No evidence of extinction is noted.  Coordination: Cerebellar testing reveals good finger-nose-finger and heel-to-shin bilaterally.  Gait and station: Gait is normal. Tandem gait is normal. Romberg is negative. Reflexes: Deep tendon reflexes are symmetric and normal bilaterally.  NIHSS:0 MRs: 0  ASSESSMENT: 68 y.o. year old female who suffered a left MCA infarct on 08/18/2013. MRI imaging confirms multi focal left frontal and parietal lobes MCA infarcts with a thin post tPA asymptomatic hemorrhage along the falx. She was taken to neuro intervention where she had near complete revascularization  of L MCA M2-M3 branches of the superior division of L MCA with IA tPA. Infarct embolic secondary to new onset atrial fibrillation seen on admitting EKG. She was discharged on aspirin 81 mg daily due to history of recent GI bleed requiring cauterization.  This was discontinued by her Gastroenterologist, who plans a repeat EGD at the end of this month.  PLAN: I had a long discussion with the patient and husband regarding her recent stroke, discussed results of evaluation in the hospital and answered questions. Dr. Gala Romney will need to clear you to take an aspirin 81 mg orally every day or an anticoagulant for secondary stroke prevention in the setting of atrial fibrillation.  If agreeable, your cardiologist may recommend an anticoagulant such as Coumadin, Xarelto, Eliquis or Pradaxa to prevent stroke from atrial fibrillation, which was documented in the hospital at time of stroke. This will have to assessed as Risk vs. Benefit.  Maintain strict control of hypertension with blood pressure goal below 140/90, maintain strict control of hypertension with blood pressure goal below 140/90, diabetes with hemoglobin A1c goal below 7% and lipids with  LDL cholesterol goal below 70 mg/dL. Followup in the future in 3 months with Dr. Erlinda Hong, sooner as needed.  Rudi Rummage Olivene Cookston, MSN, FNP-BC, A/GNP-C 11/24/2013, 3:43 PM [facility] [address] [place], [place] [address] [phone]  Note: This document was prepared with digital dictation and possible smart phrase technology. Any transcriptional errors that result from this process are unintentional.

## 2013-11-25 NOTE — Progress Notes (Signed)
I agree with the above plan 

## 2013-12-02 ENCOUNTER — Inpatient Hospital Stay (HOSPITAL_COMMUNITY)
Admission: EM | Admit: 2013-12-02 | Discharge: 2013-12-08 | DRG: 291 | Disposition: A | Discharge status: Home Health | Payer: Medicare Other | Attending: Family Medicine | Admitting: Family Medicine

## 2013-12-02 ENCOUNTER — Emergency Department (HOSPITAL_COMMUNITY): Payer: Medicare Other

## 2013-12-02 ENCOUNTER — Encounter (HOSPITAL_COMMUNITY): Payer: Self-pay | Admitting: Emergency Medicine

## 2013-12-02 DIAGNOSIS — Z794 Long term (current) use of insulin: Secondary | ICD-10-CM

## 2013-12-02 DIAGNOSIS — N186 End stage renal disease: Secondary | ICD-10-CM | POA: Diagnosis present

## 2013-12-02 DIAGNOSIS — Z833 Family history of diabetes mellitus: Secondary | ICD-10-CM | POA: Diagnosis not present

## 2013-12-02 DIAGNOSIS — I4891 Unspecified atrial fibrillation: Secondary | ICD-10-CM | POA: Diagnosis present

## 2013-12-02 DIAGNOSIS — J189 Pneumonia, unspecified organism: Secondary | ICD-10-CM

## 2013-12-02 DIAGNOSIS — E1165 Type 2 diabetes mellitus with hyperglycemia: Secondary | ICD-10-CM

## 2013-12-02 DIAGNOSIS — J9600 Acute respiratory failure, unspecified whether with hypoxia or hypercapnia: Secondary | ICD-10-CM | POA: Diagnosis present

## 2013-12-02 DIAGNOSIS — Z992 Dependence on renal dialysis: Secondary | ICD-10-CM | POA: Diagnosis not present

## 2013-12-02 DIAGNOSIS — E876 Hypokalemia: Secondary | ICD-10-CM

## 2013-12-02 DIAGNOSIS — I509 Heart failure, unspecified: Secondary | ICD-10-CM | POA: Diagnosis present

## 2013-12-02 DIAGNOSIS — D631 Anemia in chronic kidney disease: Secondary | ICD-10-CM | POA: Diagnosis present

## 2013-12-02 DIAGNOSIS — I2510 Atherosclerotic heart disease of native coronary artery without angina pectoris: Secondary | ICD-10-CM | POA: Diagnosis present

## 2013-12-02 DIAGNOSIS — Z8719 Personal history of other diseases of the digestive system: Secondary | ICD-10-CM

## 2013-12-02 DIAGNOSIS — J811 Chronic pulmonary edema: Secondary | ICD-10-CM

## 2013-12-02 DIAGNOSIS — N058 Unspecified nephritic syndrome with other morphologic changes: Secondary | ICD-10-CM | POA: Diagnosis present

## 2013-12-02 DIAGNOSIS — E785 Hyperlipidemia, unspecified: Secondary | ICD-10-CM

## 2013-12-02 DIAGNOSIS — Z951 Presence of aortocoronary bypass graft: Secondary | ICD-10-CM

## 2013-12-02 DIAGNOSIS — Z8673 Personal history of transient ischemic attack (TIA), and cerebral infarction without residual deficits: Secondary | ICD-10-CM | POA: Diagnosis not present

## 2013-12-02 DIAGNOSIS — D6959 Other secondary thrombocytopenia: Secondary | ICD-10-CM | POA: Diagnosis present

## 2013-12-02 DIAGNOSIS — Z954 Presence of other heart-valve replacement: Secondary | ICD-10-CM | POA: Diagnosis not present

## 2013-12-02 DIAGNOSIS — I120 Hypertensive chronic kidney disease with stage 5 chronic kidney disease or end stage renal disease: Secondary | ICD-10-CM | POA: Diagnosis present

## 2013-12-02 DIAGNOSIS — Z87898 Personal history of other specified conditions: Secondary | ICD-10-CM

## 2013-12-02 DIAGNOSIS — Z9119 Patient's noncompliance with other medical treatment and regimen: Secondary | ICD-10-CM | POA: Diagnosis not present

## 2013-12-02 DIAGNOSIS — J9601 Acute respiratory failure with hypoxia: Secondary | ICD-10-CM

## 2013-12-02 DIAGNOSIS — I5032 Chronic diastolic (congestive) heart failure: Secondary | ICD-10-CM

## 2013-12-02 DIAGNOSIS — J90 Pleural effusion, not elsewhere classified: Secondary | ICD-10-CM

## 2013-12-02 DIAGNOSIS — I6340 Cerebral infarction due to embolism of unspecified cerebral artery: Secondary | ICD-10-CM

## 2013-12-02 DIAGNOSIS — I252 Old myocardial infarction: Secondary | ICD-10-CM | POA: Diagnosis not present

## 2013-12-02 DIAGNOSIS — I482 Chronic atrial fibrillation, unspecified: Secondary | ICD-10-CM

## 2013-12-02 DIAGNOSIS — R197 Diarrhea, unspecified: Secondary | ICD-10-CM

## 2013-12-02 DIAGNOSIS — I10 Essential (primary) hypertension: Secondary | ICD-10-CM

## 2013-12-02 DIAGNOSIS — Z8543 Personal history of malignant neoplasm of ovary: Secondary | ICD-10-CM

## 2013-12-02 DIAGNOSIS — Z91199 Patient's noncompliance with other medical treatment and regimen due to unspecified reason: Secondary | ICD-10-CM | POA: Diagnosis not present

## 2013-12-02 DIAGNOSIS — I5033 Acute on chronic diastolic (congestive) heart failure: Principal | ICD-10-CM | POA: Diagnosis present

## 2013-12-02 DIAGNOSIS — E1129 Type 2 diabetes mellitus with other diabetic kidney complication: Secondary | ICD-10-CM | POA: Diagnosis present

## 2013-12-02 DIAGNOSIS — Z87891 Personal history of nicotine dependence: Secondary | ICD-10-CM | POA: Diagnosis not present

## 2013-12-02 DIAGNOSIS — R0602 Shortness of breath: Secondary | ICD-10-CM | POA: Diagnosis present

## 2013-12-02 DIAGNOSIS — N039 Chronic nephritic syndrome with unspecified morphologic changes: Secondary | ICD-10-CM

## 2013-12-02 DIAGNOSIS — R630 Anorexia: Secondary | ICD-10-CM

## 2013-12-02 LAB — COMPREHENSIVE METABOLIC PANEL
ALT: 14 U/L (ref 0–35)
AST: 22 U/L (ref 0–37)
Albumin: 2.4 g/dL — ABNORMAL LOW (ref 3.5–5.2)
Alkaline Phosphatase: 75 U/L (ref 39–117)
Anion gap: 15 (ref 5–15)
BUN: 6 mg/dL (ref 6–23)
CALCIUM: 8.4 mg/dL (ref 8.4–10.5)
CO2: 26 mEq/L (ref 19–32)
Chloride: 99 mEq/L (ref 96–112)
Creatinine, Ser: 2.6 mg/dL — ABNORMAL HIGH (ref 0.50–1.10)
GFR calc Af Amer: 20 mL/min — ABNORMAL LOW (ref >90)
GFR calc non Af Amer: 18 mL/min — ABNORMAL LOW (ref >90)
Glucose, Bld: 134 mg/dL — ABNORMAL HIGH (ref 70–99)
Potassium: 3.2 mEq/L — ABNORMAL LOW (ref 3.7–5.3)
SODIUM: 140 meq/L (ref 137–147)
TOTAL PROTEIN: 7 g/dL (ref 6.0–8.3)
Total Bilirubin: 0.5 mg/dL (ref 0.3–1.2)

## 2013-12-02 LAB — CBC WITH DIFFERENTIAL/PLATELET
BASOS ABS: 0 10*3/uL (ref 0.0–0.1)
BASOS PCT: 0 % (ref 0–1)
EOS ABS: 0.2 10*3/uL (ref 0.0–0.7)
EOS PCT: 2 % (ref 0–5)
HCT: 32 % — ABNORMAL LOW (ref 36.0–46.0)
Hemoglobin: 9.7 g/dL — ABNORMAL LOW (ref 12.0–15.0)
Lymphocytes Relative: 12 % (ref 12–46)
Lymphs Abs: 0.8 10*3/uL (ref 0.7–4.0)
MCH: 31.5 pg (ref 26.0–34.0)
MCHC: 30.3 g/dL (ref 30.0–36.0)
MCV: 103.9 fL — ABNORMAL HIGH (ref 78.0–100.0)
Monocytes Absolute: 0.7 10*3/uL (ref 0.1–1.0)
Monocytes Relative: 10 % (ref 3–12)
Neutro Abs: 4.9 10*3/uL (ref 1.7–7.7)
Neutrophils Relative %: 76 % (ref 43–77)
PLATELETS: 137 10*3/uL — AB (ref 150–400)
RBC: 3.08 MIL/uL — ABNORMAL LOW (ref 3.87–5.11)
RDW: 18.8 % — AB (ref 11.5–15.5)
WBC: 6.5 10*3/uL (ref 4.0–10.5)

## 2013-12-02 LAB — PRO B NATRIURETIC PEPTIDE: Pro B Natriuretic peptide (BNP): 36660 pg/mL — ABNORMAL HIGH (ref 0–125)

## 2013-12-02 LAB — TROPONIN I: Troponin I: <0.3 ng/mL (ref <0.30)

## 2013-12-02 MED ORDER — DEXTROSE 5 % IV SOLN
2.0000 g | Freq: Once | INTRAVENOUS | Status: AC
  Administered 2013-12-02: 2 g via INTRAVENOUS
  Filled 2013-12-02: qty 2

## 2013-12-02 MED ORDER — DEXTROSE 5 % IV SOLN: 1.0000 g | Freq: Three times a day (TID) | INTRAVENOUS | Status: DC

## 2013-12-02 MED ORDER — HEPARIN SODIUM (PORCINE) 5000 UNIT/ML IJ SOLN
5000.0000 [IU] | Freq: Three times a day (TID) | INTRAMUSCULAR | Status: DC
  Administered 2013-12-02 – 2013-12-08 (×13): 5000 [IU] via SUBCUTANEOUS
  Filled 2013-12-02 (×13): qty 1

## 2013-12-02 MED ORDER — CARVEDILOL 3.125 MG PO TABS
3.1250 mg | ORAL_TABLET | Freq: Two times a day (BID) | ORAL | Status: DC
  Administered 2013-12-03 – 2013-12-08 (×9): 3.125 mg via ORAL
  Filled 2013-12-02 (×10): qty 1

## 2013-12-02 MED ORDER — ALPRAZOLAM 0.5 MG PO TABS
0.5000 mg | ORAL_TABLET | Freq: Every evening | ORAL | Status: DC | PRN
  Administered 2013-12-03 – 2013-12-06 (×4): 0.5 mg via ORAL
  Filled 2013-12-02 (×4): qty 1

## 2013-12-02 MED ORDER — VANCOMYCIN HCL IN DEXTROSE 1-5 GM/200ML-% IV SOLN
1000.0000 mg | Freq: Once | INTRAVENOUS | Status: AC
  Administered 2013-12-02: 1000 mg via INTRAVENOUS
  Filled 2013-12-02: qty 200

## 2013-12-02 MED ORDER — INSULIN ASPART PROT & ASPART (70-30 MIX) 100 UNIT/ML ~~LOC~~ SUSP
16.0000 [IU] | Freq: Every day | SUBCUTANEOUS | Status: DC
  Administered 2013-12-03: 16 [IU] via SUBCUTANEOUS
  Filled 2013-12-02: qty 10

## 2013-12-02 MED ORDER — INSULIN ASPART 100 UNIT/ML ~~LOC~~ SOLN
0.0000 [IU] | Freq: Three times a day (TID) | SUBCUTANEOUS | Status: DC
  Administered 2013-12-03: 1 [IU] via SUBCUTANEOUS
  Administered 2013-12-05 (×2): 2 [IU] via SUBCUTANEOUS
  Administered 2013-12-07 – 2013-12-08 (×2): 1 [IU] via SUBCUTANEOUS

## 2013-12-02 MED ORDER — ONDANSETRON HCL 4 MG PO TABS: 4.0000 mg | ORAL_TABLET | Freq: Four times a day (QID) | ORAL | Status: DC | PRN

## 2013-12-02 MED ORDER — DEXTROSE 5 % IV SOLN: 2.0000 g | Freq: Once | INTRAVENOUS | Status: DC

## 2013-12-02 MED ORDER — IOHEXOL 300 MG/ML  SOLN
80.0000 mL | Freq: Once | INTRAMUSCULAR | Status: AC | PRN
  Administered 2013-12-02: 80 mL via INTRAVENOUS

## 2013-12-02 MED ORDER — MECLIZINE HCL 12.5 MG PO TABS: 12.5000 mg | ORAL_TABLET | Freq: Two times a day (BID) | ORAL | Status: DC | PRN

## 2013-12-02 MED ORDER — PANTOPRAZOLE SODIUM 40 MG PO TBEC
40.0000 mg | DELAYED_RELEASE_TABLET | Freq: Two times a day (BID) | ORAL | Status: DC
  Administered 2013-12-02 – 2013-12-08 (×12): 40 mg via ORAL
  Filled 2013-12-02 (×12): qty 1

## 2013-12-02 MED ORDER — MIRTAZAPINE 30 MG PO TABS
15.0000 mg | ORAL_TABLET | Freq: Every day | ORAL | Status: DC
  Administered 2013-12-02 – 2013-12-07 (×4): 15 mg via ORAL
  Filled 2013-12-02 (×5): qty 1

## 2013-12-02 MED ORDER — INSULIN ASPART PROT & ASPART (70-30 MIX) 100 UNIT/ML ~~LOC~~ SUSP
8.0000 [IU] | Freq: Every day | SUBCUTANEOUS | Status: DC
  Administered 2013-12-03 – 2013-12-05 (×2): 8 [IU] via SUBCUTANEOUS

## 2013-12-02 NOTE — ED Notes (Signed)
Sob today, dialysis today, No NVD. Denies pain

## 2013-12-02 NOTE — ED Provider Notes (Signed)
CSN: [id number]     Arrival date & time 12/02/13  1600 History   First MD Initiated Contact with Patient 12/02/13 1644     Chief Complaint  Patient presents with  . Shortness of Breath      HPI  Patient presents with a chief complaint of shortness of breath fatigue and weakness.  History of end-stage renal disease insulin-dependent diabetes. She underwent coronary bypass grafting and mitral valve repair in March. Recovered uneventfully, however she states she's felt poorly with weakness and fatigue since that time. In July of this year she had a stroke. Had IV tPA, then guided catheter therapy. Had excellent results and also his discharge without neuro deficits. Admitted in August for diarrhea and weakness.  She states she was has not felt well since March when she had her bypass surgery 6 years fatigue she has no energy level. Doesn't seem to matter on dialysis days, before, or after. Back was dialyzed today and states she feels worse. Husband states that she sleeps "great" sitting in her chair with oxygen on. However she cannot lay flat with or without her oxygen without marked shortness of breath. She denies fever shakes chills. She does not feel depressed. She sleeps well sitting upright. Her appetite is poor. No dependent edema. No chest pain  Past Medical History  Diagnosis Date  . Diabetes mellitus   . Hypertension   . Hyperlipidemia   . Coronary artery disease   . Carpal tunnel syndrome   . Anemia   . Colon polyps 01/2012    Per colonoscopy, Dr. Gala Romney  . Transfusion history 03-04-13    Transfusion x1 unit a month ago-[facility]  . Stroke 2010    2010-"brief weakness,tongue thickness,incoherent"-no residual affects.  . Mitral regurgitation   . Myocardial infarction 2009  . Peritonitis 06/23/2013  . Ovarian cancer 1995    [name]  . ESRD (end stage renal disease)     Started dialysis in 2009 with hemodialysis and did HD for about 5 years using a R arm AVF.   Changed to PD due to pt likes to travel and is independent.  Her nephrologist is Dr Hinda Lenis in [place], [place].      Past Surgical History  Procedure Laterality Date  . Complete abdominal hysterectomy  1995  . Abdominal hysterectomy    . Colonoscopy with esophagogastroduodenoscopy (egd)  01/27/2012    Dr. Gala Romney. Small hiatal hernia, abnormal second portion of the duodenum with questionable extrinsic compression. Single left sided colonic diverticulum, 2 colon polyps. Hamartomatous colon polyp  . Eus  04/09/2012    Dr. Owens Loffler: Nonspecific edema/thickening of the periampullary duodenum. Unremarkable biopsy  . Tonsillectomy      child  . Peritoneal catheter insertion  10/30/2012    for peritoneal dialysis  . Back surgery  2009    x3 level fusion-Dr. Louanne Skye  . Cardiac catheterization      x2 stents-'09- Dr. Joellen Jersey  . Esophagogastroduodenoscopy (egd) with propofol N/A 03/11/2013    Dr. Owens Loffler, small duodenal diverticulum. Edematous duodenal mucosa with friability on biopsy (pathology negative)  . Three-vessel cabg, mitral valve annuloplasty, may ease  06/17/2013    [facility]  . Coronary artery bypass graft    . Esophagogastroduodenoscopy N/A 07/02/2013    [facility]:GI BLEED DUE TO LARGE DUODENAL ULCER/Small hiatal hernia/PYLORIC CHANNEL ULCER/Multiple CLEANED BASED ulcers in the duodenal bulb and 2nd part of the duodenum  . Givens capsule study N/A 07/02/2013    Procedure: GIVENS CAPSULE STUDY;  Surgeon:  Danie Binder, MD;  Location: [facility];  Service: Endoscopy;  Laterality: N/A;  possible deployment in EGD negative  . Radiology with anesthesia N/A 08/19/2013    Procedure: RADIOLOGY WITH ANESTHESIA;  Surgeon: Rob Hickman, MD;  Location: [facility];  Service: Radiology;  Laterality: N/A;   Family History  Problem Relation Age of Onset  . Diabetes Mother    History  Substance Use Topics  . Smoking status: Former Smoker -- 0.50 packs/day for 15 years    Quit date:  03/25/1958  . Smokeless tobacco: Never Used  . Alcohol Use: No   OB History   Grav Para Term Preterm Abortions TAB SAB Ect Mult Living                 Review of Systems  Constitutional: Positive for fatigue. Negative for fever, chills, diaphoresis and appetite change.  HENT: Negative for mouth sores, sore throat and trouble swallowing.   Eyes: Negative for visual disturbance.  Respiratory: Positive for cough and shortness of breath. Negative for chest tightness and wheezing.   Cardiovascular: Negative for chest pain.  Gastrointestinal: Negative for nausea, vomiting, abdominal pain, diarrhea and abdominal distention.  Endocrine: Negative for polydipsia, polyphagia and polyuria.  Genitourinary: Negative for dysuria, frequency and hematuria.  Musculoskeletal: Negative for gait problem.  Skin: Negative for color change, pallor and rash.  Neurological: Positive for weakness. Negative for dizziness, syncope, light-headedness and headaches.  Hematological: Does not bruise/bleed easily.  Psychiatric/Behavioral: Negative for behavioral problems and confusion.      Allergies  Lisinopril and Ezetimibe  Home Medications   Prior to Admission medications   Medication Sig Start Date End Date Taking? Authorizing Provider  ALPRAZolam Duanne Moron) 0.5 MG tablet Take 0.5 mg by mouth at bedtime as needed for anxiety.   Yes Historical Provider, MD  carvedilol (COREG) 3.125 MG tablet Take 3.125 mg by mouth 2 (two) times daily with a meal.   Yes Historical Provider, MD  EPINEPHrine 0.3 mg/0.3 mL IJ SOAJ injection Inject 0.3 mg into the muscle daily as needed (allergic reaction).   Yes Historical Provider, MD  insulin aspart protamine- aspart (NOVOLOG MIX 70/30) (70-30) 100 UNIT/ML injection Inject 10-20 Units into the skin daily as needed (20 units in the morning and 10 units at bedtime as needed for blood sugar levels).   Yes Historical Provider, MD  meclizine (ANTIVERT) 25 MG tablet Take 12.5 mg by mouth  2 (two) times daily as needed for dizziness (Vertigo).    Yes Historical Provider, MD  mirtazapine (REMERON) 15 MG tablet Take 15 mg by mouth at bedtime.   Yes Historical Provider, MD  multivitamin (RENA-VIT) TABS tablet Take 1 tablet by mouth every morning.   Yes Historical Provider, MD  ondansetron (ZOFRAN) 4 MG tablet Take 1 tablet (4 mg total) by mouth every 6 (six) hours as needed for nausea. 10/29/13  Yes Sid Falcon, MD  pantoprazole (PROTONIX) 40 MG tablet Take 1 tablet (40 mg total) by mouth 2 (two) times daily. 09/03/13  Yes Milus Banister, MD   BP 119/35  Pulse 78  Temp(Src) 99.2 F (37.3 C) (Oral)  Resp 27  Ht 5\' 5"  (1.651 m)  SpO2 94% Physical Exam  Constitutional: She is oriented to person, place, and time. She appears well-developed and well-nourished. No distress.  HENT:  Head: Normocephalic.  Eyes: Conjunctivae are normal. Pupils are equal, round, and reactive to light. No scleral icterus.  Neck: Normal range of motion. Neck supple. No thyromegaly  present.  No JVD at 45.  Cardiovascular: Normal rate and regular rhythm.  Exam reveals gallop. Exam reveals no friction rub.   No murmur heard. Sinus rhythm. Noted that she was in atrial fibrillation during her acute stroke.   Pulmonary/Chest: Effort normal and breath sounds normal. No respiratory distress. She has no wheezes. She has no rales.  Diminished bibasilar breath sounds. Dosed potassium diminished breath sounds from the mid scapula inferiorly in the right chest.  No crackles.  Abdominal: Soft. Bowel sounds are normal. She exhibits no distension. There is no tenderness. There is no rebound.  Musculoskeletal: Normal range of motion.  Neurological: She is alert and oriented to person, place, and time.  Skin: Skin is warm and dry. No rash noted.  Psychiatric: She has a normal mood and affect. Her behavior is normal.    ED Course  Procedures (including critical care time) Labs Review Labs Reviewed  CBC WITH  DIFFERENTIAL - Abnormal; Notable for the following:    RBC 3.08 (*)    Hemoglobin 9.7 (*)    HCT 32.0 (*)    MCV 103.9 (*)    RDW 18.8 (*)    Platelets 137 (*)    All other components within normal limits  COMPREHENSIVE METABOLIC PANEL - Abnormal; Notable for the following:    Potassium 3.2 (*)    Glucose, Bld 134 (*)    Creatinine, Ser 2.60 (*)    Albumin 2.4 (*)    GFR calc non Af Amer 18 (*)    GFR calc Af Amer 20 (*)    All other components within normal limits  PRO B NATRIURETIC PEPTIDE - Abnormal; Notable for the following:    Pro B Natriuretic peptide (BNP) 36660.0 (*)    All other components within normal limits  CULTURE, BLOOD (SINGLE)  TROPONIN I  TSH    Imaging Review Dg Chest 2 View  12/02/2013   CLINICAL DATA:  Shortness of breath. Weakness. Coronary artery disease. Ovarian carcinoma.  EXAM: CHEST  2 VIEW  COMPARISON:  10/24/2013  FINDINGS: Cardiomegaly is stable. Moderate right pleural effusion shows mild increase in size. Small left pleural effusion shows no significant change. Pulmonary interstitial prominence again seen bilaterally, suspicious for mild interstitial edema. 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  IMPRESSION: Stable cardiomegaly and probable diffuse interstitial edema.  Bilateral pleural effusions and bibasilar atelectasis, with mild increase in the right lung base.   Electronically Signed   By: Earle Gell M.D.   On: 12/02/2013 18:15   Ct Chest W Contrast  12/02/2013   CLINICAL DATA:  Shortness of breath  EXAM: CT CHEST WITH CONTRAST  TECHNIQUE: Multidetector CT imaging of the chest was performed during intravenous contrast administration.  CONTRAST:  49mL OMNIPAQUE IOHEXOL 300 MG/ML  SOLN  COMPARISON:  Chest x-ray from earlier in the same day  FINDINGS: Consolidation is noted in the right middle lobe and right lower lobe. Patchy infiltrate is noted in the left lower lobe. A large right-sided pleural effusion is seen similar to  that noted on prior chest x-ray. No parenchymal nodule or mass lesion is seen. The thoracic aorta and its branches are were within normal limits. The cardiac shadow is significantly enlarged. Mitral valve replacement is noted. Heavy coronary calcifications are seen is both changes of prior coronary bypass grafting. No significant hilar or mediastinal adenopathy is noted. The visualized upper abdomen is within normal limits. No acute bony abnormality is seen.  IMPRESSION: Bibasilar consolidation worse on  the right than the left. Large right-sided pleural effusion is noted. Although not mentioned in the body air the report there is some generalized increased density within the lungs likely related to underlying pulmonary edema.   Electronically Signed   By: Inez Catalina M.D.   On: 12/02/2013 19:39     EKG Interpretation   Date/Time:  Thursday December 02 2013 16:43:46 EDT Ventricular Rate:  78 PR Interval:  163 QRS Duration: 96 QT Interval:  429 QTC Calculation: 489 R Axis:   -91 Text Interpretation:  Sinus rhythm Multiple ventricular premature  complexes Inferior infarct, old Abnormal lateral Q waves Anterior infarct,  old Confirmed by Jeneen Rinks  MD, [place] ([id number]) on 12/02/2013 7:01:01 PM      MDM   Final diagnoses:  Healthcare-associated pneumonia    Patient on initial x-ray shows bilateral pleural effusions. Right greater than left. In review of her previous x-ray she showed persistence of her right pleural effusion. This may be due in part to her volume status and her hemodialysis and end-stage renal disease. Liver, there is in appearance regular x-ray of infiltrate. CT of her chest was obtained and confirmed right middle and lower lobe infiltrates and right para-pneumonic effusion.  BNP is elevated, but pt without peripheral edema, Gallop rhythm, JVD, or crackles.   Discussed with Hospitalist.  BLC obtained, and Vanco/Maxepime ordered.  Pt with 93% saturations on 2lNC O2.     Tanna Furry,  MD 12/02/13 2033

## 2013-12-02 NOTE — Progress Notes (Signed)
[place] for Vancomycin and Zosyn  Indication: pneumonia  Allergies  Allergen Reactions  . Lisinopril Swelling  . Ezetimibe Swelling    ZETIA: Tongue swelling    Patient Measurements: Height: 5\' 5"  (165.1 cm) IBW/kg (Calculated) : 57  Vital Signs: Temp: 99.2 F (37.3 C) (09/10 1613) Temp src: Oral (09/10 1613) BP: 119/35 mmHg (09/10 2000) Pulse Rate: 78 (09/10 2000) Intake/Output from previous day:   Intake/Output from this shift:    Labs:  Recent Labs  12/02/13 1726  WBC 6.5  HGB 9.7*  PLT 137*  CREATININE 2.60*   The CrCl is unknown because both a height and weight (above a minimum accepted value) are required for this calculation. No results found for this basename: VANCOTROUGH, VANCOPEAK, VANCORANDOM, GENTTROUGH, GENTPEAK, GENTRANDOM, TOBRATROUGH, TOBRAPEAK, TOBRARND, AMIKACINPEAK, AMIKACINTROU, AMIKACIN,  in the last 72 hours   Microbiology: No results found for this or any previous visit (from the past 720 hour(s)).  Anti-infectives   Start     Dose/Rate Route Frequency Ordered Stop   12/02/13 2200  ceFEPIme (MAXIPIME) 1 g in dextrose 5 % 50 mL IVPB  Status:  Discontinued     1 g 100 mL/hr over 30 Minutes Intravenous 3 times per day 12/02/13 2034 12/02/13 2048   12/02/13 2030  ceFEPIme (MAXIPIME) 2 g in dextrose 5 % 50 mL IVPB     2 g 100 mL/hr over 30 Minutes Intravenous  Once 12/02/13 2023     12/02/13 2030  ceFEPIme (MAXIPIME) 2 g in dextrose 5 % 50 mL IVPB  Status:  Discontinued     2 g 100 mL/hr over 30 Minutes Intravenous  Once 12/02/13 2024 12/02/13 2048      Assessment: OK for protocol, ESRD (end stage renal disease)  Started dialysis in 2009 with hemodialysis and did HD for about 5 years using a R arm AVF. Changed to PD due to pt likes to travel and is independent.   Goal of Therapy:  Pre-Hemodialysis Vancomycin level goal range =15-25 mcg/ml (if transition to HD while hospitalized).  Plan:  Cefepime 2gm  IV x 1. Vancomycin 1gm IV x 1. Further doses pending nephrology decisions. Measure antibiotic drug levels at steady state Follow up culture results  Pricilla Larsson 12/02/2013,8:49 PM

## 2013-12-02 NOTE — H&P (Addendum)
History and Physical    [name] FYB:[id number] DOB: December 31, 1945 DOA: 12/02/2013  Referring physician: Dr. Jeneen Rinks PCP: Shirline Frees, MD  Specialists: Nephrology  Chief Complaint: Shortness of breath  HPI: [name] is a 68 y.o. 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 She presents to the emergency room with a chief complaint of shortness of breath. She's been feeling short-winded for the past one to 2 days and is unable to lay flat without getting dyspneic. She went to get her hemodialysis today, and she tells me that she has no fluid pulled all of and in fact she got 1 or 1.5 L. She doesn't her dry weight to 68.5 kg. She denies any fever or chills. She denies any cough or chest congestion. She has no chest pain. She denies any lightheadedness or dizziness. She has no abdominal pain, nausea vomiting or diarrhea. In the emergency room, she had a chest x-ray and a CT scan which showed possible healthcare associated pneumonia as well as large right-sided pleural effusion. In the emergency room she is breathing comfortable on 2 L nasal cannula (which he uses at home at nighttime only), satting in the mid 90s. Hospitalist asked for admission.  Review of Systems: As per history of present illness, otherwise  Past Medical History  Diagnosis Date  . Diabetes mellitus   . Hypertension   . Hyperlipidemia   . Coronary artery disease   . Carpal tunnel syndrome   . Anemia   . Colon polyps 01/2012    Per colonoscopy, Dr. Gala Romney  . Transfusion history 03-04-13    Transfusion x1 unit a month ago-[facility]  . Stroke 2010    2010-"brief weakness,tongue thickness,incoherent"-no residual affects.  . Mitral regurgitation   .  Myocardial infarction 2009  . Peritonitis 06/23/2013  . Ovarian cancer 1995    [name]  . ESRD (end stage renal disease)     Started dialysis in 2009 with hemodialysis and did HD for about 5 years using a R arm AVF.  Changed to PD due to pt likes to travel and is independent.  Her nephrologist is Dr Hinda Lenis in [place], [place].      Past Surgical History  Procedure Laterality Date  . Complete abdominal hysterectomy  1995  . Abdominal hysterectomy    . Colonoscopy with esophagogastroduodenoscopy (egd)  01/27/2012    Dr. Gala Romney. Small hiatal hernia, abnormal second portion of the duodenum with questionable extrinsic compression. Single left sided colonic diverticulum, 2 colon polyps. Hamartomatous colon polyp  . Eus  04/09/2012    Dr. Owens Loffler: Nonspecific edema/thickening of the periampullary duodenum. Unremarkable biopsy  . Tonsillectomy      child  . Peritoneal catheter insertion  10/30/2012    for peritoneal dialysis  . Back surgery  2009    x3 level fusion-Dr. Louanne Skye  . Cardiac catheterization      x2 stents-'09- Dr. Joellen Jersey  . Esophagogastroduodenoscopy (egd) with propofol N/A 03/11/2013    Dr. Owens Loffler, small duodenal diverticulum. Edematous duodenal mucosa with friability on biopsy (pathology negative)  . Three-vessel cabg, mitral valve annuloplasty, may ease  06/17/2013    [facility]  . Coronary artery bypass graft    . Esophagogastroduodenoscopy N/A 07/02/2013    [facility]:GI BLEED DUE TO LARGE  DUODENAL ULCER/Small hiatal hernia/PYLORIC CHANNEL ULCER/Multiple CLEANED BASED ulcers in the duodenal bulb and 2nd part of the duodenum  . Givens capsule study N/A 07/02/2013    Procedure: GIVENS CAPSULE STUDY;  Surgeon: Danie Binder, MD;  Location: [facility];  Service: Endoscopy;  Laterality: N/A;  possible deployment in EGD negative  . Radiology with anesthesia N/A 08/19/2013    Procedure: RADIOLOGY WITH ANESTHESIA;  Surgeon: Rob Hickman, MD;   Location: [facility];  Service: Radiology;  Laterality: N/A;   Social History:  reports that she quit smoking about 55 years ago. She has never used smokeless tobacco. She reports that she does not drink alcohol or use illicit drugs.  Allergies  Allergen Reactions  . Lisinopril Swelling  . Ezetimibe Swelling    ZETIA: Tongue swelling    Family History  Problem Relation Age of Onset  . Diabetes Mother     Prior to Admission medications   Medication Sig Start Date End Date Taking? Authorizing Provider  ALPRAZolam Duanne Moron) 0.5 MG tablet Take 0.5 mg by mouth at bedtime as needed for anxiety.   Yes Historical Provider, MD  carvedilol (COREG) 3.125 MG tablet Take 3.125 mg by mouth 2 (two) times daily with a meal.   Yes Historical Provider, MD  EPINEPHrine 0.3 mg/0.3 mL IJ SOAJ injection Inject 0.3 mg into the muscle daily as needed (allergic reaction).   Yes Historical Provider, MD  insulin aspart protamine- aspart (NOVOLOG MIX 70/30) (70-30) 100 UNIT/ML injection Inject 10-20 Units into the skin daily as needed (20 units in the morning and 10 units at bedtime as needed for blood sugar levels).   Yes Historical Provider, MD  meclizine (ANTIVERT) 25 MG tablet Take 12.5 mg by mouth 2 (two) times daily as needed for dizziness (Vertigo).    Yes Historical Provider, MD  mirtazapine (REMERON) 15 MG tablet Take 15 mg by mouth at bedtime.   Yes Historical Provider, MD  multivitamin (RENA-VIT) TABS tablet Take 1 tablet by mouth every morning.   Yes Historical Provider, MD  ondansetron (ZOFRAN) 4 MG tablet Take 1 tablet (4 mg total) by mouth every 6 (six) hours as needed for nausea. 10/29/13  Yes Sid Falcon, MD  pantoprazole (PROTONIX) 40 MG tablet Take 1 tablet (40 mg total) by mouth 2 (two) times daily. 09/03/13  Yes Milus Banister, MD   Physical Exam: Filed Vitals:   12/02/13 1830 12/02/13 1904 12/02/13 1908 12/02/13 2000  BP: 126/51  112/46 119/35  Pulse: 81  81 78  Temp:      TempSrc:      Resp:  28  25 27   Height:      SpO2: 97% 94% 95% 94%     General:  NAD  Eyes: no scleral icterus  ENT: moist oropharynx  Neck: supple, no JVD  Cardiovascular: regular rate without MRG; 2+ peripheral pulses  Respiratory: decreased breath sounds right, coarse breath sounds throughout, no wheezing, no crackles  Abdomen: soft, non tender to palpation, positive bowel sounds  Skin: no rashes  Musculoskeletal: no peripheral edema  Neurologic: non focal  Labs on Admission:  Basic Metabolic Panel:  Recent Labs Lab 12/02/13 1726  NA 140  K 3.2*  CL 99  CO2 26  GLUCOSE 134*  BUN 6  CREATININE 2.60*  CALCIUM 8.4   Liver Function Tests:  Recent Labs Lab 12/02/13 1726  AST 22  ALT 14  ALKPHOS 75  BILITOT 0.5  PROT 7.0  ALBUMIN 2.4*  CBC:  Recent Labs Lab 12/02/13 1726  WBC 6.5  NEUTROABS 4.9  HGB 9.7*  HCT 32.0*  MCV 103.9*  PLT 137*   Cardiac Enzymes:  Recent Labs Lab 12/02/13 1726  TROPONINI <0.30    BNP (last 3 results)  Recent Labs  12/02/13 1726  PROBNP 36660.0*   Radiological Exams on Admission: Dg Chest 2 View  12/02/2013   CLINICAL DATA:  Shortness of breath. Weakness. Coronary artery disease. Ovarian carcinoma.  EXAM: CHEST  2 VIEW  COMPARISON:  10/24/2013  FINDINGS: Cardiomegaly is stable. Moderate right pleural effusion shows mild increase in size. Small left pleural effusion shows no significant change. Pulmonary interstitial prominence again seen bilaterally, suspicious for mild interstitial edema. 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  IMPRESSION: Stable cardiomegaly and probable diffuse interstitial edema.  Bilateral pleural effusions and bibasilar atelectasis, with mild increase in the right lung base.   Electronically Signed   By: Earle Gell M.D.   On: 12/02/2013 18:15   Ct Chest W Contrast  12/02/2013   CLINICAL DATA:  Shortness of breath  EXAM: CT CHEST WITH CONTRAST  TECHNIQUE: Multidetector CT imaging  of the chest was performed during intravenous contrast administration.  CONTRAST:  62mL OMNIPAQUE IOHEXOL 300 MG/ML  SOLN  COMPARISON:  Chest x-ray from earlier in the same day  FINDINGS: Consolidation is noted in the right middle lobe and right lower lobe. Patchy infiltrate is noted in the left lower lobe. A large right-sided pleural effusion is seen similar to that noted on prior chest x-ray. No parenchymal nodule or mass lesion is seen. The thoracic aorta and its branches are were within normal limits. The cardiac shadow is significantly enlarged. Mitral valve replacement is noted. Heavy coronary calcifications are seen is both changes of prior coronary bypass grafting. No significant hilar or mediastinal adenopathy is noted. The visualized upper abdomen is within normal limits. No acute bony abnormality is seen.  IMPRESSION: Bibasilar consolidation worse on the right than the left. Large right-sided pleural effusion is noted. Although not mentioned in the body air the report there is some generalized increased density within the lungs likely related to underlying pulmonary edema.   Electronically Signed   By: Inez Catalina M.D.   On: 12/02/2013 19:39    EKG: Independently reviewed.  Assessment/Plan Active Problems:   HYPERLIPIDEMIA-MIXED   HYPERTENSION   CAD, NATIVE VESSEL   Type II or unspecified type diabetes mellitus with renal manifestations, uncontrolled   Pneumonia   HCAP (healthcare-associated pneumonia)   Acute on chronic diastolic heart failure   #1 HCAP - start broad-spectrum vancomycin and cefepime per pharmacy. - Obtain blood cultures, urine Legionella and strep pneumo antigens - Oxygen as needed  #2 End-stage renal disease - consult nephrology. May need dialysis in am  #3 right-sided pleural effusion - may be related to acute on chronic heart failure, ?parapneumonic. May need HD, defer to renal. Repeat CXR after HD and consider thoracentesis if persistent.  #4 acute on chronic  diastolic heart failure - with right sided pleural effusion, she is anuric. Her respiratory status is stable, avoid IVF and nephrology consult in am.   #5 CAD - stable, continue home medications  #6 DM - resume home insulin, decrease dose by 20%  #7, 8 Anemia / Thrombocytopenia - due to CKD  Diet: Renal with fluid restriction Fluids: none  DVT Prophylaxis: heparin  Code Status: Full  Family Communication: d/w husband  Disposition Plan: inpatient  Time spent: 68  This note has been created with Surveyor, quantity. Any transcriptional errors are unintentional.   Mertis Mosher M. Cruzita Lederer, MD [facility] Pager [phone]  If 7PM-7AM, please contact night-coverage [email or web address] Password [facility] 12/02/2013, 8:37 PM

## 2013-12-02 NOTE — ED Notes (Signed)
Lab unable to collect TSH; Dr. Jeneen Rinks notified.

## 2013-12-02 NOTE — ED Notes (Signed)
Pt on 2.5L O2 nasal cannula. O2 saturation 96%.

## 2013-12-03 DIAGNOSIS — E1129 Type 2 diabetes mellitus with other diabetic kidney complication: Secondary | ICD-10-CM

## 2013-12-03 DIAGNOSIS — I5032 Chronic diastolic (congestive) heart failure: Secondary | ICD-10-CM

## 2013-12-03 DIAGNOSIS — Z992 Dependence on renal dialysis: Secondary | ICD-10-CM

## 2013-12-03 DIAGNOSIS — E1165 Type 2 diabetes mellitus with hyperglycemia: Secondary | ICD-10-CM

## 2013-12-03 DIAGNOSIS — J90 Pleural effusion, not elsewhere classified: Secondary | ICD-10-CM

## 2013-12-03 DIAGNOSIS — J9601 Acute respiratory failure with hypoxia: Secondary | ICD-10-CM | POA: Diagnosis present

## 2013-12-03 DIAGNOSIS — N186 End stage renal disease: Secondary | ICD-10-CM

## 2013-12-03 DIAGNOSIS — I10 Essential (primary) hypertension: Secondary | ICD-10-CM

## 2013-12-03 LAB — GLUCOSE, CAPILLARY
GLUCOSE-CAPILLARY: 226 mg/dL — AB (ref 70–99)
Glucose-Capillary: 135 mg/dL — ABNORMAL HIGH (ref 70–99)
Glucose-Capillary: 76 mg/dL (ref 70–99)
Glucose-Capillary: 69 mg/dL — ABNORMAL LOW (ref 70–99)
Glucose-Capillary: 77 mg/dL (ref 70–99)
Glucose-Capillary: 143 mg/dL — ABNORMAL HIGH (ref 70–99)

## 2013-12-03 LAB — CBC
HEMATOCRIT: 31.4 % — AB (ref 36.0–46.0)
Hemoglobin: 9.4 g/dL — ABNORMAL LOW (ref 12.0–15.0)
MCH: 31.4 pg (ref 26.0–34.0)
MCHC: 29.9 g/dL — AB (ref 30.0–36.0)
MCV: 105 fL — ABNORMAL HIGH (ref 78.0–100.0)
PLATELETS: 202 10*3/uL (ref 150–400)
RBC: 2.99 MIL/uL — ABNORMAL LOW (ref 3.87–5.11)
RDW: 19.2 % — AB (ref 11.5–15.5)
WBC: 5.9 10*3/uL (ref 4.0–10.5)

## 2013-12-03 LAB — RENAL FUNCTION PANEL
Albumin: 2.3 g/dL — ABNORMAL LOW (ref 3.5–5.2)
Anion gap: 12 (ref 5–15)
BUN: 9 mg/dL (ref 6–23)
CALCIUM: 8.4 mg/dL (ref 8.4–10.5)
CO2: 29 meq/L (ref 19–32)
Chloride: 98 mEq/L (ref 96–112)
Creatinine, Ser: 3.33 mg/dL — ABNORMAL HIGH (ref 0.50–1.10)
GFR calc Af Amer: 15 mL/min — ABNORMAL LOW (ref >90)
GFR calc non Af Amer: 13 mL/min — ABNORMAL LOW (ref >90)
GLUCOSE: 161 mg/dL — AB (ref 70–99)
Phosphorus: 4.2 mg/dL (ref 2.3–4.6)
Potassium: 3.3 mEq/L — ABNORMAL LOW (ref 3.7–5.3)
Sodium: 139 mEq/L (ref 137–147)

## 2013-12-03 LAB — MRSA PCR SCREENING: MRSA BY PCR: NEGATIVE

## 2013-12-03 LAB — HIV ANTIBODY (ROUTINE TESTING W REFLEX): HIV 1&2 Ab, 4th Generation: NONREACTIVE

## 2013-12-03 LAB — PHOSPHORUS: Phosphorus: 4.2 mg/dL (ref 2.3–4.6)

## 2013-12-03 LAB — MAGNESIUM: Magnesium: 1.8 mg/dL (ref 1.5–2.5)

## 2013-12-03 LAB — TSH: TSH: 2.22 u[IU]/mL (ref 0.350–4.500)

## 2013-12-03 MED ORDER — BUDESONIDE 0.25 MG/2ML IN SUSP
0.2500 mg | Freq: Two times a day (BID) | RESPIRATORY_TRACT | Status: DC
  Administered 2013-12-03 – 2013-12-08 (×11): 0.25 mg via RESPIRATORY_TRACT
  Filled 2013-12-03 (×15): qty 2

## 2013-12-03 MED ORDER — VANCOMYCIN HCL IN DEXTROSE 750-5 MG/150ML-% IV SOLN
750.0000 mg | INTRAVENOUS | Status: DC
  Administered 2013-12-04: 750 mg via INTRAVENOUS
  Filled 2013-12-03: qty 150

## 2013-12-03 MED ORDER — INSULIN ASPART PROT & ASPART (70-30 MIX) 100 UNIT/ML ~~LOC~~ SUSP
SUBCUTANEOUS | Status: AC
  Filled 2013-12-03: qty 10

## 2013-12-03 MED ORDER — POTASSIUM CHLORIDE CRYS ER 20 MEQ PO TBCR
40.0000 meq | EXTENDED_RELEASE_TABLET | Freq: Once | ORAL | Status: AC
  Administered 2013-12-03: 40 meq via ORAL
  Filled 2013-12-03: qty 2

## 2013-12-03 MED ORDER — DEXTROSE 5 % IV SOLN
2.0000 g | INTRAVENOUS | Status: DC
  Administered 2013-12-04: 2 g via INTRAVENOUS
  Filled 2013-12-03: qty 2

## 2013-12-03 NOTE — Consult Note (Signed)
Reason for Consult: End-stage renal disease Referring Physician: Dr. Jasmine Pang is an 68 y.o. female.  HPI: She is a patient who has history of hypertension, diabetes, coronary artery disease status post CABG and history of mitral valve repair presently came with complaints of weakness, difficulty increasing. Patient was recently converted from peritoneal dialysis because of inadequate dialysis related to her noncompliance with regimen. She was also sign off from dialysis however show some improvement the last couple of weeks. Patient however continued to feel weak and tired. She came to dialysis yesterday complaining of difficulty breathing. She denies any cough, fever chills or sweating. An attempt was made to remove fluid however unable to ultrafiltrate because of hypotension. When she was evaluated in emergency room patient was found to have bibasilar consultation and large right-sided pleural effusion. Presently she says that she's feeling much better once  she was put on oxygen. Still she denies any cough no sputum production.  Past Medical History  Diagnosis Date  . Diabetes mellitus   . Hypertension   . Hyperlipidemia   . Coronary artery disease   . Carpal tunnel syndrome   . Anemia   . Colon polyps 01/2012    Per colonoscopy, Dr. Gala Romney  . Transfusion history 03-04-13    Transfusion x1 unit a month ago-[facility]  . Stroke 2010    2010-"brief weakness,tongue thickness,incoherent"-no residual affects.  . Mitral regurgitation   . Myocardial infarction 2009  . Peritonitis 06/23/2013  . Ovarian cancer 1995    [name]  . ESRD (end stage renal disease)     Started dialysis in 2009 with hemodialysis and did HD for about 5 years using a R arm AVF.  Changed to PD due to pt likes to travel and is independent.  Her nephrologist is Dr Hinda Lenis in [place], [place].       Past Surgical History  Procedure Laterality Date  . Complete abdominal hysterectomy  1995  .  Abdominal hysterectomy    . Colonoscopy with esophagogastroduodenoscopy (egd)  01/27/2012    Dr. Gala Romney. Small hiatal hernia, abnormal second portion of the duodenum with questionable extrinsic compression. Single left sided colonic diverticulum, 2 colon polyps. Hamartomatous colon polyp  . Eus  04/09/2012    Dr. Owens Loffler: Nonspecific edema/thickening of the periampullary duodenum. Unremarkable biopsy  . Tonsillectomy      child  . Peritoneal catheter insertion  10/30/2012    for peritoneal dialysis  . Back surgery  2009    x3 level fusion-Dr. Louanne Skye  . Cardiac catheterization      x2 stents-'09- Dr. Joellen Jersey  . Esophagogastroduodenoscopy (egd) with propofol N/A 03/11/2013    Dr. Owens Loffler, small duodenal diverticulum. Edematous duodenal mucosa with friability on biopsy (pathology negative)  . Three-vessel cabg, mitral valve annuloplasty, may ease  06/17/2013    [facility]  . Coronary artery bypass graft    . Esophagogastroduodenoscopy N/A 07/02/2013    [facility]:GI BLEED DUE TO LARGE DUODENAL ULCER/Small hiatal hernia/PYLORIC CHANNEL ULCER/Multiple CLEANED BASED ulcers in the duodenal bulb and 2nd part of the duodenum  . Givens capsule study N/A 07/02/2013    Procedure: GIVENS CAPSULE STUDY;  Surgeon: Danie Binder, MD;  Location: [facility];  Service: Endoscopy;  Laterality: N/A;  possible deployment in EGD negative  . Radiology with anesthesia N/A 08/19/2013    Procedure: RADIOLOGY WITH ANESTHESIA;  Surgeon: Rob Hickman, MD;  Location: [facility];  Service: Radiology;  Laterality: N/A;    Family History  Problem Relation Age of Onset  . Diabetes Mother     Social History:  reports that she quit smoking about 55 years ago. She has never used smokeless tobacco. She reports that she does not drink alcohol or use illicit drugs.  Allergies:  Allergies  Allergen Reactions  . Lisinopril Swelling  . Ezetimibe Swelling    ZETIA: Tongue swelling    Medications: I have  reviewed the patient's current medications.  Results for orders placed during the hospital encounter of 12/02/13 (from the past 48 hour(s))  TSH     Status: None   Collection Time    12/02/13  5:26 PM      Result Value Ref Range   TSH 2.220  0.350 - 4.500 uIU/mL   Comment: Performed at [facility]  CBC WITH DIFFERENTIAL     Status: Abnormal   Collection Time    12/02/13  5:26 PM      Result Value Ref Range   WBC 6.5  4.0 - 10.5 K/uL   RBC 3.08 (*) 3.87 - 5.11 MIL/uL   Hemoglobin 9.7 (*) 12.0 - 15.0 g/dL   HCT 32.0 (*) 36.0 - 46.0 %   MCV 103.9 (*) 78.0 - 100.0 fL   MCH 31.5  26.0 - 34.0 pg   MCHC 30.3  30.0 - 36.0 g/dL   RDW 18.8 (*) 11.5 - 15.5 %   Platelets 137 (*) 150 - 400 K/uL   Comment: SPECIMEN CHECKED FOR CLOTS     PLATELET COUNT CONFIRMED BY SMEAR   Neutrophils Relative % 76  43 - 77 %   Neutro Abs 4.9  1.7 - 7.7 K/uL   Lymphocytes Relative 12  12 - 46 %   Lymphs Abs 0.8  0.7 - 4.0 K/uL   Monocytes Relative 10  3 - 12 %   Monocytes Absolute 0.7  0.1 - 1.0 K/uL   Eosinophils Relative 2  0 - 5 %   Eosinophils Absolute 0.2  0.0 - 0.7 K/uL   Basophils Relative 0  0 - 1 %   Basophils Absolute 0.0  0.0 - 0.1 K/uL  COMPREHENSIVE METABOLIC PANEL     Status: Abnormal   Collection Time    12/02/13  5:26 PM      Result Value Ref Range   Sodium 140  137 - 147 mEq/L   Potassium 3.2 (*) 3.7 - 5.3 mEq/L   Chloride 99  96 - 112 mEq/L   CO2 26  19 - 32 mEq/L   Glucose, Bld 134 (*) 70 - 99 mg/dL   BUN 6  6 - 23 mg/dL   Creatinine, Ser 2.60 (*) 0.50 - 1.10 mg/dL   Calcium 8.4  8.4 - 10.5 mg/dL   Total Protein 7.0  6.0 - 8.3 g/dL   Albumin 2.4 (*) 3.5 - 5.2 g/dL   AST 22  0 - 37 U/L   ALT 14  0 - 35 U/L   Alkaline Phosphatase 75  39 - 117 U/L   Total Bilirubin 0.5  0.3 - 1.2 mg/dL   GFR calc non Af Amer 18 (*) >90 mL/min   GFR calc Af Amer 20 (*) >90 mL/min   Comment: (NOTE)     The eGFR has been calculated using the CKD EPI equation.     This calculation has not  been validated in all clinical situations.     eGFR's persistently <90 mL/min signify possible Chronic Kidney     Disease.   Anion  gap 15  5 - 15  TROPONIN I     Status: None   Collection Time    12/02/13  5:26 PM      Result Value Ref Range   Troponin I <0.30  <0.30 ng/mL   Comment:            Due to the release kinetics of cTnI,     a negative result within the first hours     of the onset of symptoms does not rule out     myocardial infarction with certainty.     If myocardial infarction is still suspected,     repeat the test at appropriate intervals.  PRO B NATRIURETIC PEPTIDE     Status: Abnormal   Collection Time    12/02/13  5:26 PM      Result Value Ref Range   Pro B Natriuretic peptide (BNP) 36660.0 (*) 0 - 125 pg/mL  CULTURE, BLOOD (ROUTINE X 2)     Status: None   Collection Time    12/02/13  9:06 PM      Result Value Ref Range   Specimen Description BLOOD LEFT HAND     Special Requests       Value: BOTTLES DRAWN AEROBIC AND ANAEROBIC AEB 5CC ANA 2CC   Culture PENDING     Report Status PENDING    CULTURE, BLOOD (ROUTINE X 2)     Status: None   Collection Time    12/02/13  9:06 PM      Result Value Ref Range   Specimen Description BLOOD LEFT HAND     Special Requests BOTTLES DRAWN AEROBIC AND ANAEROBIC 2CC EACH     Culture PENDING     Report Status PENDING    GLUCOSE, CAPILLARY     Status: Abnormal   Collection Time    12/03/13  1:39 AM      Result Value Ref Range   Glucose-Capillary 226 (*) 70 - 99 mg/dL  CBC     Status: Abnormal   Collection Time    12/03/13  5:44 AM      Result Value Ref Range   WBC 5.9  4.0 - 10.5 K/uL   RBC 2.99 (*) 3.87 - 5.11 MIL/uL   Hemoglobin 9.4 (*) 12.0 - 15.0 g/dL   HCT 31.4 (*) 36.0 - 46.0 %   MCV 105.0 (*) 78.0 - 100.0 fL   MCH 31.4  26.0 - 34.0 pg   MCHC 29.9 (*) 30.0 - 36.0 g/dL   RDW 19.2 (*) 11.5 - 15.5 %   Platelets 202  150 - 400 K/uL   Comment: DELTA CHECK NOTED  MAGNESIUM     Status: None   Collection Time     12/03/13  5:44 AM      Result Value Ref Range   Magnesium 1.8  1.5 - 2.5 mg/dL  PHOSPHORUS     Status: None   Collection Time    12/03/13  5:44 AM      Result Value Ref Range   Phosphorus 4.2  2.3 - 4.6 mg/dL  RENAL FUNCTION PANEL     Status: Abnormal   Collection Time    12/03/13  5:44 AM      Result Value Ref Range   Sodium 139  137 - 147 mEq/L   Potassium 3.3 (*) 3.7 - 5.3 mEq/L   Chloride 98  96 - 112 mEq/L   CO2 29  19 - 32 mEq/L   Glucose, Bld 161 (*)  70 - 99 mg/dL   BUN 9  6 - 23 mg/dL   Creatinine, Ser 3.33 (*) 0.50 - 1.10 mg/dL   Calcium 8.4  8.4 - 10.5 mg/dL   Phosphorus 4.2  2.3 - 4.6 mg/dL   Albumin 2.3 (*) 3.5 - 5.2 g/dL   GFR calc non Af Amer 13 (*) >90 mL/min   GFR calc Af Amer 15 (*) >90 mL/min   Comment: (NOTE)     The eGFR has been calculated using the CKD EPI equation.     This calculation has not been validated in all clinical situations.     eGFR's persistently <90 mL/min signify possible Chronic Kidney     Disease.   Anion gap 12  5 - 15  GLUCOSE, CAPILLARY     Status: Abnormal   Collection Time    12/03/13  7:31 AM      Result Value Ref Range   Glucose-Capillary 135 (*) 70 - 99 mg/dL   Comment 1 Documented in Chart     Comment 2 Notify RN      Dg Chest 2 View  12/02/2013   CLINICAL DATA:  Shortness of breath. Weakness. Coronary artery disease. Ovarian carcinoma.  EXAM: CHEST  2 VIEW  COMPARISON:  10/24/2013  FINDINGS: Cardiomegaly is stable. Moderate right pleural effusion shows mild increase in size. Small left pleural effusion shows no significant change. Pulmonary interstitial prominence again seen bilaterally, suspicious for mild interstitial edema. 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  IMPRESSION: Stable cardiomegaly and probable diffuse interstitial edema.  Bilateral pleural effusions and bibasilar atelectasis, with mild increase in the right lung base.   Electronically Signed   By: Earle Gell M.D.   On: 12/02/2013  18:15   Ct Chest W Contrast  12/02/2013   CLINICAL DATA:  Shortness of breath  EXAM: CT CHEST WITH CONTRAST  TECHNIQUE: Multidetector CT imaging of the chest was performed during intravenous contrast administration.  CONTRAST:  2m OMNIPAQUE IOHEXOL 300 MG/ML  SOLN  COMPARISON:  Chest x-ray from earlier in the same day  FINDINGS: Consolidation is noted in the right middle lobe and right lower lobe. Patchy infiltrate is noted in the left lower lobe. A large right-sided pleural effusion is seen similar to that noted on prior chest x-ray. No parenchymal nodule or mass lesion is seen. The thoracic aorta and its branches are were within normal limits. The cardiac shadow is significantly enlarged. Mitral valve replacement is noted. Heavy coronary calcifications are seen is both changes of prior coronary bypass grafting. No significant hilar or mediastinal adenopathy is noted. The visualized upper abdomen is within normal limits. No acute bony abnormality is seen.  IMPRESSION: Bibasilar consolidation worse on the right than the left. Large right-sided pleural effusion is noted. Although not mentioned in the body air the report there is some generalized increased density within the lungs likely related to underlying pulmonary edema.   Electronically Signed   By: MInez CatalinaM.D.   On: 12/02/2013 19:39    Review of Systems  Constitutional: Positive for malaise/fatigue.  Respiratory: Positive for shortness of breath. Negative for cough and sputum production.   Cardiovascular: Negative for orthopnea and leg swelling.  Gastrointestinal: Negative for nausea and vomiting.       She has poor appetite  Neurological: Positive for weakness.   Blood pressure 122/52, pulse 78, temperature 98.6 F (37 C), temperature source Oral, resp. rate 14, height '5\' 5"'  (1.651 m), weight 68.8 kg (151 lb  10.8 oz), SpO2 96.00%. Physical Exam  Constitutional: She is oriented to person, place, and time. No distress.  Eyes: No scleral  icterus.  Neck: No JVD present.  Cardiovascular: Normal rate and regular rhythm.   Respiratory: She has no wheezes. She has no rales.  Decreased breath sound bilaterally right greater than left  GI: She exhibits no distension.  Musculoskeletal: She exhibits no edema.  Neurological: She is alert and oriented to person, place, and time.    Assessment/Plan: Problem #1 bilateral pneumonia: Presently patient is on antibiotics and she is a febrile. Her white blood cell count is also normal. Problem #2 right pleural effusion: Possibly parapneumonic and may be the reason for her difficulty in breathing. Problem #3 hypertension her blood pressure seems to be reasonably controlled Problem #4 coronary artery disease: Patient does not have a chest pain Problem #5 anemia: Her hemoglobin and hematocrit is low. Patient with history of recurrent GI bleeding. Problem #6 end-stage renal disease: She status post hemodialysis yesterday. Her appetite is still poor but she doesn't have any nausea vomiting. A part of it may be secondary to inadequate dialysis. Problem #7 metabolic bone disease: Her calcium is range. Plan: We'll make arrangements for patient to get dialysis tomorrow. We'll use 4K./2.5 calcium bath. We'll Epogen 10,000 units IV after each dialysis Possibly patient may require thoracentesis for right pleural effusion. We'll check her basic metabolic panel, phosphorus and CBC in the morning.   Betsey Sossamon S 12/03/2013, 8:22 AM

## 2013-12-03 NOTE — Progress Notes (Signed)
ANTIBIOTIC CONSULT NOTE - follow up  Pharmacy Consult for Vancomycin and Cefepime Indication: pneumonia  Allergies  Allergen Reactions  . Lisinopril Swelling  . Ezetimibe Swelling    ZETIA: Tongue swelling   Patient Measurements: Height: 5\' 5"  (165.1 cm) Weight: 151 lb 10.8 oz (68.8 kg) IBW/kg (Calculated) : 57  Vital Signs: Temp: 97.7 F (36.5 C) (09/11 0850) BP: 115/49 mmHg (09/11 0850) Intake/Output from previous day:   Intake/Output from this shift:    Labs:  Recent Labs  12/02/13 1726 12/03/13 0544  WBC 6.5 5.9  HGB 9.7* 9.4*  PLT 137* 202  CREATININE 2.60* 3.33*   Estimated Creatinine Clearance: 15.3 ml/min (by C-G formula based on Cr of 3.33). No results found for this basename: VANCOTROUGH, Corlis Leak, VANCORANDOM, GENTTROUGH, GENTPEAK, GENTRANDOM, TOBRATROUGH, TOBRAPEAK, TOBRARND, AMIKACINPEAK, AMIKACINTROU, AMIKACIN,  in the last 72 hours   Microbiology: Recent Results (from the past 720 hour(s))  CULTURE, BLOOD (ROUTINE X 2)     Status: None   Collection Time    12/02/13  9:06 PM      Result Value Ref Range Status   Specimen Description BLOOD LEFT HAND   Final   Special Requests     Final   Value: BOTTLES DRAWN AEROBIC AND ANAEROBIC AEB 5CC ANA 2CC   Culture PENDING   Incomplete   Report Status PENDING   Incomplete  CULTURE, BLOOD (ROUTINE X 2)     Status: None   Collection Time    12/02/13  9:06 PM      Result Value Ref Range Status   Specimen Description BLOOD LEFT HAND   Final   Special Requests BOTTLES DRAWN AEROBIC AND ANAEROBIC Devol   Final   Culture PENDING   Incomplete   Report Status PENDING   Incomplete  MRSA PCR SCREENING     Status: None   Collection Time    12/03/13  5:03 AM      Result Value Ref Range Status   MRSA by PCR NEGATIVE  NEGATIVE Final   Comment:            The GeneXpert MRSA Assay (FDA     approved for NASAL specimens     only), is one component of a     comprehensive MRSA colonization     surveillance  program. It is not     intended to diagnose MRSA     infection nor to guide or     monitor treatment for     MRSA infections.    Anti-infectives   Start     Dose/Rate Route Frequency Ordered Stop   12/04/13 1200  ceFEPIme (MAXIPIME) 2 g in dextrose 5 % 50 mL IVPB     2 g 100 mL/hr over 30 Minutes Intravenous Every T-Th-Sa (Hemodialysis) 12/03/13 1056     12/04/13 1200  vancomycin (VANCOCIN) IVPB 750 mg/150 ml premix     750 mg 150 mL/hr over 60 Minutes Intravenous Every T-Th-Sa (Hemodialysis) 12/03/13 1056     12/02/13 2200  ceFEPIme (MAXIPIME) 1 g in dextrose 5 % 50 mL IVPB  Status:  Discontinued     1 g 100 mL/hr over 30 Minutes Intravenous 3 times per day 12/02/13 2034 12/02/13 2048   12/02/13 2100  vancomycin (VANCOCIN) IVPB 1000 mg/200 mL premix     1,000 mg 200 mL/hr over 60 Minutes Intravenous  Once 12/02/13 2051 12/02/13 2344   12/02/13 2030  ceFEPIme (MAXIPIME) 2 g in dextrose 5 % 50 mL IVPB  2 g 100 mL/hr over 30 Minutes Intravenous  Once 12/02/13 2023 12/02/13 2154   12/02/13 2030  ceFEPIme (MAXIPIME) 2 g in dextrose 5 % 50 mL IVPB  Status:  Discontinued     2 g 100 mL/hr over 30 Minutes Intravenous  Once 12/02/13 2024 12/02/13 2120     Assessment: OK for protocol, ESRD (end stage renal disease) requiring dialysis. Started dialysis in 2009 with hemodialysis and did HD for about 5 years using a R arm AVF. Changed to PD due to pt likes to travel and is independent.  Goal of Therapy:  Pre-Hemodialysis Vancomycin level goal range =15-25 mcg/ml (if transition to HD while hospitalized).  Plan:  Cefepime 2gm IV after each dialysis (plan for T-Th-Sa) Vancomycin 750mg  IV after dialysis (T-Th-Sa) Measure antibiotic drug levels at steady state Follow up culture results  Hart Robinsons A 12/03/2013,10:57 AM

## 2013-12-03 NOTE — Progress Notes (Signed)
Patient seen and examined. Feeling better and breathing easier. Case has been discussed with NP and a/P elaborated under my supervision. Agree with findings on her note.  Plan: -Continue IV antibiotics -maximize volume extraction with HD in am -will repeat CXR after HD -start flutter valve and pulmicort -will follow clinical response -non-toxic and improving.  Barton Dubois [phone]

## 2013-12-03 NOTE — Progress Notes (Signed)
[facility] PROGRESS NOTE  [name] ZWC:[id number] DOB: Aug 04, 1945 DOA: 12/02/2013 PCP: Shirline Frees, MD  Assessment/Plan:  Acute respiratory failure with hypoxia: sats 88% on room air on admission. Likely multifactorial i.e. Right sided pleural effusion, HCAP and acute on chronic heart failure. Respiratory effort improved this am. sats 95% on 2.5L. ProBNP [id number]. Dialysis scheduled for tomorrow. Consider repeat chest xray tomorrow after dialysis to evaluation right pleural effusion. If no improvement may consider thoracentesis. Continue vancomycin and cefepime day #2. Blood cultures no growth to date. Await urine legionell and strep pneumo antigens as patient makes "very little" urine. She is afebrile and nontoxic appearing   #1 HCAP - see #1. vancomycin and cefepime day #2. She is non-toxic appearing and hemodynamically stable.    #2 End-stage renal disease - appreciate nephrology input. dialysis in am   #3 right-sided pleural effusion - may be related to acute on chronic heart failure, ?parapneumonic. Consider repeat  CXR after HD and consider thoracentesis if persistent.   #4 acute on chronic diastolic heart failure - with right sided pleural effusion, she is anuric. Her respiratory status has improved over night. See above   #5 CAD - stable. No chest pain. continue home medications   #6 DM - resume home insulin, decrease dose by 20%. CBG range 135-226. Monitor   #7, 8 Anemia / Thrombocytopenia - due to CKD. Stable at baseline. On Epogen  Code Status: full Family Communication: none present Disposition Plan: home   Consultants:  Dr Lowanda Foster nephrology  Procedures:  none  Antibiotics:  Vancomycin 12/02/13>>  Cefepime 12/02/13>>  HPI/Subjective: Awake but somewhat sleepy. Reports "breathing much easier" denies cough/pain/discomfort.   Objective: Filed Vitals:   12/03/13 0850  BP: 115/49  Pulse:   Temp: 97.7 F (36.5 C)  Resp:     Intake/Output  Summary (Last 24 hours) at 12/03/13 0953 Last data filed at 12/03/13 0800  Gross per 24 hour  Intake      0 ml  Output      0 ml  Net      0 ml   Filed Weights   12/03/13 0300  Weight: 68.8 kg (151 lb 10.8 oz)    Exam:   General:  Appears calm and comfortable  Cardiovascular: RRR, no MGR No LE edema PPP  Respiratory: normal effort with conversation, BS diminished particularly right base. No wheeze  Abdomen: flat soft +BS non-tender to palpation  Musculoskeletal: fair muscle tone, no clubbing or cyanosis   Data Reviewed: Basic Metabolic Panel:  Recent Labs Lab 12/02/13 1726 12/03/13 0544  NA 140 139  K 3.2* 3.3*  CL 99 98  CO2 26 29  GLUCOSE 134* 161*  BUN 6 9  CREATININE 2.60* 3.33*  CALCIUM 8.4 8.4  MG  --  1.8  PHOS  --  4.2  4.2   Liver Function Tests:  Recent Labs Lab 12/02/13 1726 12/03/13 0544  AST 22  --   ALT 14  --   ALKPHOS 75  --   BILITOT 0.5  --   PROT 7.0  --   ALBUMIN 2.4* 2.3*   No results found for this basename: LIPASE, AMYLASE,  in the last 168 hours No results found for this basename: AMMONIA,  in the last 168 hours CBC:  Recent Labs Lab 12/02/13 1726 12/03/13 0544  WBC 6.5 5.9  NEUTROABS 4.9  --   HGB 9.7* 9.4*  HCT 32.0* 31.4*  MCV 103.9* 105.0*  PLT 137* 202   Cardiac  Enzymes:  Recent Labs Lab 12/02/13 1726  TROPONINI <0.30   BNP (last 3 results)  Recent Labs  12/02/13 1726  PROBNP 36660.0*   CBG:  Recent Labs Lab 12/03/13 0139 12/03/13 0731  GLUCAP 226* 135*    Recent Results (from the past 240 hour(s))  CULTURE, BLOOD (ROUTINE X 2)     Status: None   Collection Time    12/02/13  9:06 PM      Result Value Ref Range Status   Specimen Description BLOOD LEFT HAND   Final   Special Requests     Final   Value: BOTTLES DRAWN AEROBIC AND ANAEROBIC AEB 5CC ANA 2CC   Culture PENDING   Incomplete   Report Status PENDING   Incomplete  CULTURE, BLOOD (ROUTINE X 2)     Status: None   Collection Time     12/02/13  9:06 PM      Result Value Ref Range Status   Specimen Description BLOOD LEFT HAND   Final   Special Requests BOTTLES DRAWN AEROBIC AND ANAEROBIC [facility]   Final   Culture PENDING   Incomplete   Report Status PENDING   Incomplete     Studies: Dg Chest 2 View  12/02/2013   CLINICAL DATA:  Shortness of breath. Weakness. Coronary artery disease. Ovarian carcinoma.  EXAM: CHEST  2 VIEW  COMPARISON:  10/24/2013  FINDINGS: Cardiomegaly is stable. Moderate right pleural effusion shows mild increase in size. Small left pleural effusion shows no significant change. Pulmonary interstitial prominence again seen bilaterally, suspicious for mild interstitial edema. 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  IMPRESSION: Stable cardiomegaly and probable diffuse interstitial edema.  Bilateral pleural effusions and bibasilar atelectasis, with mild increase in the right lung base.   Electronically Signed   By: Earle Gell M.D.   On: 12/02/2013 18:15   Ct Chest W Contrast  12/02/2013   CLINICAL DATA:  Shortness of breath  EXAM: CT CHEST WITH CONTRAST  TECHNIQUE: Multidetector CT imaging of the chest was performed during intravenous contrast administration.  CONTRAST:  58mL OMNIPAQUE IOHEXOL 300 MG/ML  SOLN  COMPARISON:  Chest x-ray from earlier in the same day  FINDINGS: Consolidation is noted in the right middle lobe and right lower lobe. Patchy infiltrate is noted in the left lower lobe. A large right-sided pleural effusion is seen similar to that noted on prior chest x-ray. No parenchymal nodule or mass lesion is seen. The thoracic aorta and its branches are were within normal limits. The cardiac shadow is significantly enlarged. Mitral valve replacement is noted. Heavy coronary calcifications are seen is both changes of prior coronary bypass grafting. No significant hilar or mediastinal adenopathy is noted. The visualized upper abdomen is within normal limits. No acute bony  abnormality is seen.  IMPRESSION: Bibasilar consolidation worse on the right than the left. Large right-sided pleural effusion is noted. Although not mentioned in the body air the report there is some generalized increased density within the lungs likely related to underlying pulmonary edema.   Electronically Signed   By: Inez Catalina M.D.   On: 12/02/2013 19:39    Scheduled Meds: . budesonide (PULMICORT) nebulizer solution  0.25 mg Nebulization BID  . carvedilol  3.125 mg Oral BID WC  . heparin  5,000 Units Subcutaneous 3 times per day  . insulin aspart  0-9 Units Subcutaneous TID WC  . insulin aspart protamine- aspart  16 Units Subcutaneous Q breakfast  . insulin aspart protamine- aspart  8 Units Subcutaneous QHS  . mirtazapine  15 mg Oral QHS  . pantoprazole  40 mg Oral BID   Continuous Infusions:   Principal Problem:   Acute respiratory failure with hypoxia Active Problems:   HYPERLIPIDEMIA-MIXED   HYPERTENSION   CAD, NATIVE VESSEL   Type II or unspecified type diabetes mellitus with renal manifestations, uncontrolled   Pneumonia   HCAP (healthcare-associated pneumonia)   Acute on chronic diastolic heart failure    Time spent: 35 minutes    [facility] Pager [phone]. If 7PM-7AM, please contact night-coverage at [email or web address], password [facility] 12/03/2013, 9:53 AM  LOS: 1 day

## 2013-12-03 NOTE — Progress Notes (Signed)
Hypoglycemic Event  CBG: 69  Treatment: Patient requested apple juice. Patient had 4oz of apple juice  Symptoms: Patient had no complaints  Follow-up CBG: Time:1656 CBG Result:77  Comments/MD notified: Yes Dr. Morrie Sheldon, Venita Sheffield  Remember to initiate Hypoglycemia Order Set & complete

## 2013-12-04 DIAGNOSIS — I5033 Acute on chronic diastolic (congestive) heart failure: Principal | ICD-10-CM

## 2013-12-04 DIAGNOSIS — I4891 Unspecified atrial fibrillation: Secondary | ICD-10-CM

## 2013-12-04 LAB — CBC
HEMATOCRIT: 30 % — AB (ref 36.0–46.0)
HEMOGLOBIN: 9.1 g/dL — AB (ref 12.0–15.0)
MCH: 31.8 pg (ref 26.0–34.0)
MCHC: 30.3 g/dL (ref 30.0–36.0)
MCV: 104.9 fL — AB (ref 78.0–100.0)
Platelets: 205 10*3/uL (ref 150–400)
RBC: 2.86 MIL/uL — ABNORMAL LOW (ref 3.87–5.11)
RDW: 19.2 % — ABNORMAL HIGH (ref 11.5–15.5)
WBC: 4.4 10*3/uL (ref 4.0–10.5)

## 2013-12-04 LAB — GLUCOSE, CAPILLARY
GLUCOSE-CAPILLARY: 95 mg/dL (ref 70–99)
Glucose-Capillary: 147 mg/dL — ABNORMAL HIGH (ref 70–99)
Glucose-Capillary: 97 mg/dL (ref 70–99)

## 2013-12-04 LAB — BASIC METABOLIC PANEL
ANION GAP: 12 (ref 5–15)
BUN: 15 mg/dL (ref 6–23)
CHLORIDE: 97 meq/L (ref 96–112)
CO2: 28 mEq/L (ref 19–32)
Calcium: 8.5 mg/dL (ref 8.4–10.5)
Creatinine, Ser: 4.61 mg/dL — ABNORMAL HIGH (ref 0.50–1.10)
GFR calc Af Amer: 10 mL/min — ABNORMAL LOW (ref >90)
GFR calc non Af Amer: 9 mL/min — ABNORMAL LOW (ref >90)
GLUCOSE: 108 mg/dL — AB (ref 70–99)
Potassium: 3.7 mEq/L (ref 3.7–5.3)
Sodium: 137 mEq/L (ref 137–147)

## 2013-12-04 LAB — HEPATITIS B SURFACE ANTIGEN: Hepatitis B Surface Ag: NEGATIVE

## 2013-12-04 LAB — PHOSPHORUS: Phosphorus: 4.8 mg/dL — ABNORMAL HIGH (ref 2.3–4.6)

## 2013-12-04 MED ORDER — LIDOCAINE HCL (PF) 1 % IJ SOLN
5.0000 mL | INTRAMUSCULAR | Status: DC | PRN
Start: 2013-12-04 — End: 2013-12-06

## 2013-12-04 MED ORDER — SODIUM CHLORIDE 0.9 % IV SOLN: 100.0000 mL | INTRAVENOUS | Status: DC | PRN

## 2013-12-04 MED ORDER — LIDOCAINE-PRILOCAINE 2.5-2.5 % EX CREA: 1.0000 "application " | TOPICAL_CREAM | CUTANEOUS | Status: DC | PRN

## 2013-12-04 MED ORDER — INSULIN ASPART PROT & ASPART (70-30 MIX) 100 UNIT/ML ~~LOC~~ SUSP
10.0000 [IU] | Freq: Every day | SUBCUTANEOUS | Status: DC
  Administered 2013-12-05 – 2013-12-06 (×2): 10 [IU] via SUBCUTANEOUS
  Filled 2013-12-04: qty 10

## 2013-12-04 MED ORDER — NEPRO/CARBSTEADY PO LIQD
237.0000 mL | ORAL | Status: DC | PRN
  Filled 2013-12-04: qty 237

## 2013-12-04 MED ORDER — HEPARIN SODIUM (PORCINE) 1000 UNIT/ML DIALYSIS
1000.0000 [IU] | INTRAMUSCULAR | Status: DC | PRN
  Filled 2013-12-04: qty 1

## 2013-12-04 MED ORDER — PENTAFLUOROPROP-TETRAFLUOROETH EX AERO
1.0000 "application " | INHALATION_SPRAY | CUTANEOUS | Status: DC | PRN
  Filled 2013-12-04: qty 30

## 2013-12-04 MED ORDER — ALTEPLASE 2 MG IJ SOLR
2.0000 mg | Freq: Once | INTRAMUSCULAR | Status: AC | PRN
  Filled 2013-12-04: qty 2

## 2013-12-04 NOTE — Progress Notes (Signed)
Subjective: Interval History: has no complaint of nausea or vomiting. Difficulty in breathing is also getting better. She has occasional cough but non-productive.  Objective: Vital signs in last 24 hours: Temp:  [97.9 F (36.6 C)-98.5 F (36.9 C)] 98.5 F (36.9 C) (09/12 0703) Pulse Rate:  [65-96] 96 (09/12 0703) Resp:  [20] 20 (09/12 0703) BP: (92-109)/(42-55) 102/42 mmHg (09/12 0703) SpO2:  [94 %-98 %] 97 % (09/12 0815) Weight:  [68.13 kg (150 lb 3.2 oz)-69.7 kg (153 lb 10.6 oz)] 69.7 kg (153 lb 10.6 oz) (09/12 0703) Weight change: -0.67 kg (-1 lb 7.6 oz)  Intake/Output from previous day: 09/11 0701 - 09/12 0700 In: 360 [P.O.:360] Out: -  Intake/Output this shift:    General appearance: alert, cooperative and no distress Resp: diminished breath sounds posterior - right Cardio: regular rate and rhythm, S1, S2 normal, no murmur, click, rub or gallop GI: soft, non-tender; bowel sounds normal; no masses,  no organomegaly Extremities: edema trace edema  Lab Results:  Recent Labs  12/03/13 0544 12/04/13 0552  WBC 5.9 4.4  HGB 9.4* 9.1*  HCT 31.4* 30.0*  PLT 202 205   BMET:  Recent Labs  12/03/13 0544 12/04/13 0552  NA 139 137  K 3.3* 3.7  CL 98 97  CO2 29 28  GLUCOSE 161* 108*  BUN 9 15  CREATININE 3.33* 4.61*  CALCIUM 8.4 8.5   No results found for this basename: PTH,  in the last 72 hours Iron Studies: No results found for this basename: IRON, TIBC, TRANSFERRIN, FERRITIN,  in the last 72 hours  Studies/Results: Dg Chest 2 View  12/02/2013   CLINICAL DATA:  Shortness of breath. Weakness. Coronary artery disease. Ovarian carcinoma.  EXAM: CHEST  2 VIEW  COMPARISON:  10/24/2013  FINDINGS: Cardiomegaly is stable. Moderate right pleural effusion shows mild increase in size. Small left pleural effusion shows no significant change. Pulmonary interstitial prominence again seen bilaterally, suspicious for mild interstitial edema. Patient has undergone previous CABG and  mitral valve replacement.  IMPRESSION: Stable cardiomegaly and probable diffuse interstitial edema.  Bilateral pleural effusions and bibasilar atelectasis, with mild increase in the right lung base.   Electronically Signed   By: Earle Gell M.D.   On: 12/02/2013 18:15   Ct Chest W Contrast  12/02/2013   CLINICAL DATA:  Shortness of breath  EXAM: CT CHEST WITH CONTRAST  TECHNIQUE: Multidetector CT imaging of the chest was performed during intravenous contrast administration.  CONTRAST:  74mL OMNIPAQUE IOHEXOL 300 MG/ML  SOLN  COMPARISON:  Chest x-ray from earlier in the same day  FINDINGS: Consolidation is noted in the right middle lobe and right lower lobe. Patchy infiltrate is noted in the left lower lobe. A large right-sided pleural effusion is seen similar to that noted on prior chest x-ray. No parenchymal nodule or mass lesion is seen. The thoracic aorta and its branches are were within normal limits. The cardiac shadow is significantly enlarged. Mitral valve replacement is noted. Heavy coronary calcifications are seen is both changes of prior coronary bypass grafting. No significant hilar or mediastinal adenopathy is noted. The visualized upper abdomen is within normal limits. No acute bony abnormality is seen.  IMPRESSION: Bibasilar consolidation worse on the right than the left. Large right-sided pleural effusion is noted. Although not mentioned in the body air the report there is some generalized increased density within the lungs likely related to underlying pulmonary edema.   Electronically Signed   By: Linus Mako.D.  On: 12/02/2013 19:39    I have reviewed the patient's current medications.  Assessment/Plan: Problem #1 difficulty in breathing: A combination of pneumonia/pleural effusion/CHF. Presently she is on oxygen and she's feeling much better. Problem #2 hypertension: Her blood pressure seems to be somewhat low normal Problem #3 right pleural effusion Problem #4 anemia: Her  hemoglobin is below range. Presently she is on Epogen. Patient has previous history of GI bleeding. Presently no history of change in the color of her stool. Her hemoglobin also didn't show significant change. Problem #5 coronary artery disease: Patient does not have any chest pain Problem #6 atrial fibrillation: Her heart rate is controlled Problem #7 hypokalemia: Potassium has corrected Problem #8 metabolic bone disease: Her calcium and phosphorus is range. Plan: We'll make arrangements for patient to get dialysis today We'll use 4K./2.5 calcium bath for 3 hours and 45 minutes We'll use low dialysate temperature because of recurrent hypotension and difficulty in removing fluid We'll try to remove about 21/2 to 3 L if her blood pressure tolerates.     LOS: 2 days   [facility] Kleinsasser S 12/04/2013,9:30 AM

## 2013-12-04 NOTE — Procedures (Signed)
   HEMODIALYSIS TREATMENT NOTE:  3.5 hour heparin-free dialysis completed via right upper arm AVF (16g antegrade).  Goal met: Tolerated removal of 3 liters without interruption in ultrafiltration.  Pt received Vancomycin and cefepime with HD.  All blood was reinfused and hemostasis was achieved within 10 minutes.  Report called to [facility], LPN  Charlott Holler. Sharin Altidor, RN, CDN

## 2013-12-04 NOTE — Progress Notes (Signed)
[facility] PROGRESS NOTE  [name] OHY:[id number] DOB: 08-26-1945 DOA: 12/02/2013 PCP: Shirline Frees, MD  Assessment/Plan:  #1 Acute respiratory failure with hypoxia: sats 88% on room air on admission. Likely multifactorial i.e. Right sided pleural effusion, HCAP and acute on chronic heart failure. Respiratory effort continue improving.  sats 95%-96 on 2L. ProBNP [id number]. Dialysis scheduled for today. Will repeat CXR after HD to evaluate status of pleural effusion; If no improvement may consider thoracentesis. Continue vancomycin and cefepime day #3. Blood cultures no growth to date. Await urine legionell and strep pneumo antigens as patient makes "very little" urine. She is afebrile and nontoxic appearing. Will continue pulmicort, flutter valve and supportive care.  #2 HCAP - continue vancomycin and cefepime day #3. She is non-toxic appearing and hemodynamically stable.    #3 End-stage renal disease - appreciate nephrology input. dialysis T-T-S  #4 right-sided pleural effusion - may be related to acute on chronic heart failure, ?parapneumonic. Consider repeat  CXR after HD and consider thoracentesis if persistent.   #5 acute on chronic diastolic heart failure - with right sided pleural effusion, she is anuric. Her respiratory status continue to improved with treatment for presumed HCAP. Will repeat CXR after HD.   #6 CAD - stable. No chest pain. continue home medications   #7 DM - resume home insulin, decrease dose a little bit more, patient appetite is not at its best and has had hypoglycemia. Will Monitor   #8 Anemia / Thrombocytopenia - due to CKD. Stable at baseline. On Epogen. -Hgb 9.1 and platelets 205 (9/12)  Code Status: full Family Communication: husband at bedside Disposition Plan: home   Consultants:  Dr Lowanda Foster nephrology  Procedures:  none  Antibiotics:  Vancomycin 12/02/13>>  Cefepime 12/02/13>>  HPI/Subjective: Reports "breathing much easier"  denies cough/chest pain/abd discomfort.  No fever  Objective: Filed Vitals:   12/04/13 1540  BP: 122/60  Pulse: 77  Temp: 97.6 F (36.4 C)  Resp: 16    Intake/Output Summary (Last 24 hours) at 12/04/13 1735 Last data filed at 12/04/13 1528  Gross per 24 hour  Intake    120 ml  Output   3000 ml  Net  -2880 ml   Filed Weights   12/04/13 0703 12/04/13 1145 12/04/13 1540  Weight: 69.7 kg (153 lb 10.6 oz) 68.8 kg (151 lb 10.8 oz) 67.8 kg (149 lb 7.6 oz)    Exam:   General:  Appears calm and comfortable, reports improvement in her breathing. No fever  Cardiovascular: RRR, no MGR No LE edema PPP  Respiratory: normal effort with conversation, BS diminished particularly right base. No wheezing  Abdomen: flat soft +BS non-tender to palpation  Musculoskeletal: fair muscle tone, no clubbing or cyanosis   Data Reviewed: Basic Metabolic Panel:  Recent Labs Lab 12/02/13 1726 12/03/13 0544 12/04/13 0552  NA 140 139 137  K 3.2* 3.3* 3.7  CL 99 98 97  CO2 26 29 28   GLUCOSE 134* 161* 108*  BUN 6 9 15   CREATININE 2.60* 3.33* 4.61*  CALCIUM 8.4 8.4 8.5  MG  --  1.8  --   PHOS  --  4.2  4.2 4.8*   Liver Function Tests:  Recent Labs Lab 12/02/13 1726 12/03/13 0544  AST 22  --   ALT 14  --   ALKPHOS 75  --   BILITOT 0.5  --   PROT 7.0  --   ALBUMIN 2.4* 2.3*   CBC:  Recent Labs Lab 12/02/13 1726 12/03/13 0544  12/04/13 0552  WBC 6.5 5.9 4.4  NEUTROABS 4.9  --   --   HGB 9.7* 9.4* 9.1*  HCT 32.0* 31.4* 30.0*  MCV 103.9* 105.0* 104.9*  PLT 137* 202 205   Cardiac Enzymes:  Recent Labs Lab 12/02/13 1726  TROPONINI <0.30   BNP (last 3 results)  Recent Labs  12/02/13 1726  PROBNP 36660.0*   CBG:  Recent Labs Lab 12/03/13 1656 12/03/13 2130 12/04/13 0725 12/04/13 1102 12/04/13 1649  GLUCAP 77 143* 95 147* 97    Recent Results (from the past 240 hour(s))  CULTURE, BLOOD (SINGLE)     Status: None   Collection Time    12/02/13  6:45 PM       Result Value Ref Range Status   Specimen Description BLOOD LEFT ANTECUBITAL COLLECTED BY DOCTOR MJ   Final   Special Requests BOTTLES DRAWN AEROBIC AND ANAEROBIC 12CC   Final   Culture     Final   Value: GRAM POSITIVE COCCI IN CLUSTERS     Gram Stain Report Called to,Read Back By and Verified With: J. LAVINDER AT 2012 ON 12/03/13 BY S.VANHOORNE   Report Status PENDING   Incomplete  CULTURE, BLOOD (ROUTINE X 2)     Status: None   Collection Time    12/02/13  9:06 PM      Result Value Ref Range Status   Specimen Description BLOOD LEFT HAND   Final   Special Requests     Final   Value: BOTTLES DRAWN AEROBIC AND ANAEROBIC AEB 5CC ANA 2CC   Culture PENDING   Incomplete   Report Status PENDING   Incomplete  CULTURE, BLOOD (ROUTINE X 2)     Status: None   Collection Time    12/02/13  9:06 PM      Result Value Ref Range Status   Specimen Description BLOOD LEFT HAND   Final   Special Requests BOTTLES DRAWN AEROBIC AND ANAEROBIC 2CC EACH   Final   Culture PENDING   Incomplete   Report Status PENDING   Incomplete  MRSA PCR SCREENING     Status: None   Collection Time    12/03/13  5:03 AM      Result Value Ref Range Status   MRSA by PCR NEGATIVE  NEGATIVE Final   Comment:            The GeneXpert MRSA Assay (FDA     approved for NASAL specimens     only), is one component of a     comprehensive MRSA colonization     surveillance program. It is not     intended to diagnose MRSA     infection nor to guide or     monitor treatment for     MRSA infections.     Studies: Dg Chest 2 View  12/02/2013   CLINICAL DATA:  Shortness of breath. Weakness. Coronary artery disease. Ovarian carcinoma.  EXAM: CHEST  2 VIEW  COMPARISON:  10/24/2013  FINDINGS: Cardiomegaly is stable. Moderate right pleural effusion shows mild increase in size. Small left pleural effusion shows no significant change. Pulmonary interstitial prominence again seen bilaterally, suspicious for mild interstitial edema. Patient  has undergone previous CABG and mitral valve replacement.  IMPRESSION: Stable cardiomegaly and probable diffuse interstitial edema.  Bilateral pleural effusions and bibasilar atelectasis, with mild increase in the right lung base.   Electronically Signed   By: Earle Gell M.D.   On: 12/02/2013 18:15   Ct Chest W  Contrast  12/02/2013   CLINICAL DATA:  Shortness of breath  EXAM: CT CHEST WITH CONTRAST  TECHNIQUE: Multidetector CT imaging of the chest was performed during intravenous contrast administration.  CONTRAST:  73mL OMNIPAQUE IOHEXOL 300 MG/ML  SOLN  COMPARISON:  Chest x-ray from earlier in the same day  FINDINGS: Consolidation is noted in the right middle lobe and right lower lobe. Patchy infiltrate is noted in the left lower lobe. A large right-sided pleural effusion is seen similar to that noted on prior chest x-ray. No parenchymal nodule or mass lesion is seen. The thoracic aorta and its branches are were within normal limits. The cardiac shadow is significantly enlarged. Mitral valve replacement is noted. Heavy coronary calcifications are seen is both changes of prior coronary bypass grafting. No significant hilar or mediastinal adenopathy is noted. The visualized upper abdomen is within normal limits. No acute bony abnormality is seen.  IMPRESSION: Bibasilar consolidation worse on the right than the left. Large right-sided pleural effusion is noted. Although not mentioned in the body air the report there is some generalized increased density within the lungs likely related to underlying pulmonary edema.   Electronically Signed   By: Inez Catalina M.D.   On: 12/02/2013 19:39    Scheduled Meds: . budesonide (PULMICORT) nebulizer solution  0.25 mg Nebulization BID  . carvedilol  3.125 mg Oral BID WC  . ceFEPime (MAXIPIME) IV  2 g Intravenous Q T,Th,Sa-HD  . heparin  5,000 Units Subcutaneous 3 times per day  . insulin aspart  0-9 Units Subcutaneous TID WC  . [START ON 12/05/2013] insulin aspart  protamine- aspart  10 Units Subcutaneous Q breakfast  . insulin aspart protamine- aspart  8 Units Subcutaneous QHS  . mirtazapine  15 mg Oral QHS  . pantoprazole  40 mg Oral BID  . vancomycin  750 mg Intravenous Q T,Th,Sa-HD   Continuous Infusions:   Principal Problem:   Acute respiratory failure with hypoxia Active Problems:   HYPERLIPIDEMIA-MIXED   HYPERTENSION   CAD, NATIVE VESSEL   Type II or unspecified type diabetes mellitus with renal manifestations, uncontrolled   Pneumonia   HCAP (healthcare-associated pneumonia)   Acute on chronic diastolic heart failure    Time spent: < 35 minutes    Barton Dubois  [facility] Pager [phone]. If 7PM-7AM, please contact night-coverage at [email or web address], password [facility] 12/04/2013, 5:35 PM  LOS: 2 days

## 2013-12-04 NOTE — Progress Notes (Signed)
Utilization review Completed Othello Sgroi RN BSN   

## 2013-12-05 ENCOUNTER — Inpatient Hospital Stay (HOSPITAL_COMMUNITY): Payer: Medicare Other

## 2013-12-05 LAB — GLUCOSE, CAPILLARY
GLUCOSE-CAPILLARY: 151 mg/dL — AB (ref 70–99)
GLUCOSE-CAPILLARY: 55 mg/dL — AB (ref 70–99)
Glucose-Capillary: 187 mg/dL — ABNORMAL HIGH (ref 70–99)
Glucose-Capillary: 104 mg/dL — ABNORMAL HIGH (ref 70–99)
Glucose-Capillary: 198 mg/dL — ABNORMAL HIGH (ref 70–99)

## 2013-12-05 LAB — CULTURE, BLOOD (SINGLE)

## 2013-12-05 LAB — BASIC METABOLIC PANEL
ANION GAP: 10 (ref 5–15)
BUN: 10 mg/dL (ref 6–23)
CHLORIDE: 96 meq/L (ref 96–112)
CO2: 29 meq/L (ref 19–32)
Calcium: 8.3 mg/dL — ABNORMAL LOW (ref 8.4–10.5)
Creatinine, Ser: 3.46 mg/dL — ABNORMAL HIGH (ref 0.50–1.10)
GFR calc Af Amer: 14 mL/min — ABNORMAL LOW (ref >90)
GFR calc non Af Amer: 12 mL/min — ABNORMAL LOW (ref >90)
GLUCOSE: 181 mg/dL — AB (ref 70–99)
Potassium: 4.2 mEq/L (ref 3.7–5.3)
SODIUM: 135 meq/L — AB (ref 137–147)

## 2013-12-05 MED ORDER — ALTEPLASE 2 MG IJ SOLR
2.0000 mg | Freq: Once | INTRAMUSCULAR | Status: AC | PRN
  Filled 2013-12-05: qty 2

## 2013-12-05 MED ORDER — SODIUM CHLORIDE 0.9 % IV SOLN: 100.0000 mL | INTRAVENOUS | Status: DC | PRN

## 2013-12-05 NOTE — Progress Notes (Signed)
Pt lost IV access and refuses to be stuck again. She is a dialysis pt and says she is a very hard stick; currently not receiving and IV meds. MD notified; no orders received.

## 2013-12-05 NOTE — Progress Notes (Signed)
Subjective: Interval History: Patient is getting better . She denies any nausea or vomiting. Her appetite is still poor but nausea or vomitng  Objective: Vital signs in last 24 hours: Temp:  [97.5 F (36.4 C)-98.3 F (36.8 C)] 97.8 F (36.6 C) (09/13 0616) Pulse Rate:  [57-77] 64 (09/13 0725) Resp:  [16-20] 20 (09/13 0616) BP: (97-125)/(37-64) 112/37 mmHg (09/13 0725) SpO2:  [94 %-98 %] 96 % (09/13 0806) Weight:  [65.635 kg (144 lb 11.2 oz)-68.8 kg (151 lb 10.8 oz)] 65.635 kg (144 lb 11.2 oz) (09/13 0616) Weight change: 1.57 kg (3 lb 7.4 oz)  Intake/Output from previous day: 09/12 0701 - 09/13 0700 In: 520 [P.O.:320; IV Piggyback:200] Out: 3000  Intake/Output this shift:    General appearance: alert, cooperative and no distress Resp: diminished breath sounds posterior - right Cardio: regular rate and rhythm, S1, S2 normal, no murmur, click, rub or gallop GI: soft, non-tender; bowel sounds normal; no masses,  no organomegaly Extremities: edema trace edema  Lab Results:  Recent Labs  12/03/13 0544 12/04/13 0552  WBC 5.9 4.4  HGB 9.4* 9.1*  HCT 31.4* 30.0*  PLT 202 205   BMET:   Recent Labs  12/04/13 0552 12/05/13 0545  NA 137 135*  K 3.7 4.2  CL 97 96  CO2 28 29  GLUCOSE 108* 181*  BUN 15 10  CREATININE 4.61* 3.46*  CALCIUM 8.5 8.3*   No results found for this basename: PTH,  in the last 72 hours Iron Studies: No results found for this basename: IRON, TIBC, TRANSFERRIN, FERRITIN,  in the last 72 hours  Studies/Results: Dg Chest 1 View  12/05/2013   CLINICAL DATA:  Acute respiratory failure, heart failure.  EXAM: CHEST - 1 VIEW  COMPARISON:  12/02/2013 CT and chest radiographs.  FINDINGS: Cardiomegaly, cardiac valve replacement changes again noted.  Continued bilateral lower lung consolidations/ atelectasis and pleural effusions noted.  Slightly increasing interstitial opacities are noted likely represent increasing interstitial edema.  There is no evidence of  pneumothorax.  IMPRESSION: Increasing interstitial pulmonary edema.  Continued bilateral lower lung consolidations/atelectasis pleural effusions.   Electronically Signed   By: Hassan Rowan M.D.   On: 12/05/2013 08:40    I have reviewed the patient's current medications.  Assessment/Plan: Problem #1 difficulty in breathing: A combination of pneumonia/pleural effusion/CHF. She is s/p dialysis with ufr of 3 liters feels better. Her chest xray still with signs of fluid over load and bilateral effusion Problem #2 hypertension: Her blood pressure seems to be somewhat low normal Problem #3 right pleural effusion Problem #4 anemia: Her hemoglobin is below but stable Problem #5 coronary artery disease: Patient does not have any chest pain Problem #6 atrial fibrillation: Her heart rate is controlled Problem #7 hypokalemia: Potassium has corrected Problem #8 metabolic bone disease: Her calcium and phosphorus is range. Plan: We'll make arrangements for patient to get dialysis today We'll use 4K./2.5 calcium bath for 31/4 hours to see if her pleural effusion improves. Patient was was under dialyzed for a couple of months because of none complains. Possibly remove about 11/2 liters today Check her basic metabolic panel and phosphorus in am We will continue to hold her binder.     LOS: 3 days   Redonna Wilbert S 12/05/2013,9:40 AM

## 2013-12-05 NOTE — Procedures (Signed)
   HEMODIALYSIS TREATMENT NOTE:  3.25 hour heparin-free dialysis completed via right upper arm AVF (16g/antegrade).  Goal met:  Tolerated removal of 1.5 liters despite 15 minutes of interrupted ultrafiltration time for SBP 86-88.  All blood was reinfused.  Hemostasis was achieved within 10 minutes.  Report called to [facility], RN.  Adrieanna Boteler L. Ronnesha Mester, RN, CDN

## 2013-12-05 NOTE — Progress Notes (Signed)
pts blood sugar 55 @ 1725 when she returned from dialysis. She drank 1 cup of juice; sugar was 104 @ 1801 when rechecked. Will continue to monitor.

## 2013-12-05 NOTE — Progress Notes (Signed)
[facility] PROGRESS NOTE  [name] [facility]:[id number] DOB: 05-23-1945 DOA: 12/02/2013 PCP: Shirline Frees, MD  Assessment/Plan:  #1 Acute respiratory failure with hypoxia: sats 88% on room air on admission. Likely multifactorial i.e. Right sided pleural effusion, HCAP and acute on chronic heart failure. Respiratory effort continue improving.  sats 95%-96 on 2L. ProBNP [id number]. Dialysis done today again, given abnormal findings on repeated CXR (worsening vascular congestion). Will continue vancomycin and cefepime day #4. Blood cultures no growth to date. She is afebrile and nontoxic appearing. Will continue pulmicort, flutter valve and supportive care.  #2 HCAP - continue vancomycin and cefepime day #4. She is non-toxic appearing and hemodynamically stable. -will switch to PO antibiotics in am (9/14) if she continue non-toxic.    #3 End-stage renal disease - appreciate nephrology input. dialysis schedule as an outpatient T-T-S -plan is for daily HD X 3 days in order to maximize fluid removal  #4 right-sided pleural effusion - may be related to acute on chronic heart failure, ?parapneumonic. Plan is for daily HD at least for 3 consecutive days in order to maximize fluid removal. -continue treatment with abx's.  #5 acute on chronic diastolic heart failure - with right sided pleural effusion, she is anuric. Her respiratory status continue to improved with treatment for presumed HCAP. Repeated CXR after HD demonstrating worsneing vascular congestion and bilat pleural effusion. -continue aggressive fluid removal with HD   #6 CAD - stable. No chest pain. continue home medications   #7 DM - continue current regimen. No further episodes of hypoglycemia.   #8 Anemia / Thrombocytopenia - due to CKD. Stable at baseline. On Epogen. -will monitor -no need for transfusion  Code Status: full Family Communication: husband at bedside Disposition Plan: home   Consultants:  Dr Lowanda Foster  (nephrology)  Procedures:  none  Antibiotics:  Vancomycin 12/02/13>>  Cefepime 12/02/13>>  HPI/Subjective: Appears calm and comfortable, feeling better and breathing better; no fever. Appetite is poor.  Objective: Filed Vitals:   12/05/13 1530  BP: 123/56  Pulse: 67  Temp:   Resp:     Intake/Output Summary (Last 24 hours) at 12/05/13 1552 Last data filed at 12/05/13 0800  Gross per 24 hour  Intake    440 ml  Output      0 ml  Net    440 ml   Filed Weights   12/04/13 1540 12/05/13 0616 12/05/13 1320  Weight: 67.8 kg (149 lb 7.6 oz) 65.635 kg (144 lb 11.2 oz) 66.6 kg (146 lb 13.2 oz)    Exam:   General:  Appears calm and comfortable, feeling better and breathing better; no fever. Appetite is poor.  Cardiovascular: RRR, no MGR No JVD  Respiratory: normal effort; no wheezing, BS diminished at bases (R > L). No frank crackles appreciated on exam.  Abdomen: flat soft +BS non-tender to palpation  Musculoskeletal: fair muscle tone, no clubbing or cyanosis   Data Reviewed: Basic Metabolic Panel:  Recent Labs Lab 12/02/13 1726 12/03/13 0544 12/04/13 0552 12/05/13 0545  NA 140 139 137 135*  K 3.2* 3.3* 3.7 4.2  CL 99 98 97 96  CO2 26 29 28 29   GLUCOSE 134* 161* 108* 181*  BUN 6 9 15 10   CREATININE 2.60* 3.33* 4.61* 3.46*  CALCIUM 8.4 8.4 8.5 8.3*  MG  --  1.8  --   --   PHOS  --  4.2  4.2 4.8*  --    Liver Function Tests:  Recent Labs Lab 12/02/13 1726 12/03/13  0544  AST 22  --   ALT 14  --   ALKPHOS 75  --   BILITOT 0.5  --   PROT 7.0  --   ALBUMIN 2.4* 2.3*   CBC:  Recent Labs Lab 12/02/13 1726 12/03/13 0544 12/04/13 0552  WBC 6.5 5.9 4.4  NEUTROABS 4.9  --   --   HGB 9.7* 9.4* 9.1*  HCT 32.0* 31.4* 30.0*  MCV 103.9* 105.0* 104.9*  PLT 137* 202 205   Cardiac Enzymes:  Recent Labs Lab 12/02/13 1726  TROPONINI <0.30   BNP (last 3 results)  Recent Labs  12/02/13 1726  PROBNP 36660.0*   CBG:  Recent Labs Lab  12/04/13 0725 12/04/13 1102 12/04/13 1649 12/05/13 0846 12/05/13 1122  GLUCAP 95 147* 97 151* 187*    Recent Results (from the past 240 hour(s))  CULTURE, BLOOD (SINGLE)     Status: None   Collection Time    12/02/13  6:45 PM      Result Value Ref Range Status   Specimen Description BLOOD LEFT ANTECUBITAL COLLECTED BY DOCTOR MJ   Final   Special Requests BOTTLES DRAWN AEROBIC AND ANAEROBIC 12CC   Final   Culture  Setup Time     Final   Value: 12/03/2013 22:00     Performed at [facility]   Culture     Final   Value: STAPHYLOCOCCUS SPECIES (COAGULASE NEGATIVE)     Note: THE SIGNIFICANCE OF ISOLATING THIS ORGANISM FROM A SINGLE VENIPUNCTURE CANNOT BE PREDICTED WITHOUT FURTHER CLINICAL AND CULTURE CORRELATION. SUSCEPTIBILITIES AVAILABLE ONLY ON REQUEST.     Note: Gram Stain Report Called to,Read Back By and Verified With: J. LAVINDER AT 2012 ON 12/03/13 BY Ellwood Handler Performed at [facility]     Performed at [facility]   Report Status 12/05/2013 FINAL   Final  CULTURE, BLOOD (ROUTINE X 2)     Status: None   Collection Time    12/02/13  9:06 PM      Result Value Ref Range Status   Specimen Description BLOOD LEFT HAND   Final   Special Requests     Final   Value: BOTTLES DRAWN AEROBIC AND ANAEROBIC AEB 5CC ANA 2CC   Culture NO GROWTH 3 DAYS   Final   Report Status PENDING   Incomplete  CULTURE, BLOOD (ROUTINE X 2)     Status: None   Collection Time    12/02/13  9:06 PM      Result Value Ref Range Status   Specimen Description BLOOD LEFT HAND   Final   Special Requests BOTTLES DRAWN AEROBIC AND ANAEROBIC 2CC EACH   Final   Culture NO GROWTH 3 DAYS   Final   Report Status PENDING   Incomplete  MRSA PCR SCREENING     Status: None   Collection Time    12/03/13  5:03 AM      Result Value Ref Range Status   MRSA by PCR NEGATIVE  NEGATIVE Final   Comment:            The GeneXpert MRSA Assay (FDA     approved for NASAL specimens     only), is one  component of a     comprehensive MRSA colonization     surveillance program. It is not     intended to diagnose MRSA     infection nor to guide or     monitor treatment for     MRSA infections.  Studies: Dg Chest 1 View  12/05/2013   CLINICAL DATA:  Acute respiratory failure, heart failure.  EXAM: CHEST - 1 VIEW  COMPARISON:  12/02/2013 CT and chest radiographs.  FINDINGS: Cardiomegaly, cardiac valve replacement changes again noted.  Continued bilateral lower lung consolidations/ atelectasis and pleural effusions noted.  Slightly increasing interstitial opacities are noted likely represent increasing interstitial edema.  There is no evidence of pneumothorax.  IMPRESSION: Increasing interstitial pulmonary edema.  Continued bilateral lower lung consolidations/atelectasis pleural effusions.   Electronically Signed   By: Hassan Rowan M.D.   On: 12/05/2013 08:40    Scheduled Meds: . budesonide (PULMICORT) nebulizer solution  0.25 mg Nebulization BID  . carvedilol  3.125 mg Oral BID WC  . ceFEPime (MAXIPIME) IV  2 g Intravenous Q T,Th,Sa-HD  . heparin  5,000 Units Subcutaneous 3 times per day  . insulin aspart  0-9 Units Subcutaneous TID WC  . insulin aspart protamine- aspart  10 Units Subcutaneous Q breakfast  . insulin aspart protamine- aspart  8 Units Subcutaneous QHS  . mirtazapine  15 mg Oral QHS  . pantoprazole  40 mg Oral BID  . vancomycin  750 mg Intravenous Q T,Th,Sa-HD   Continuous Infusions:   Principal Problem:   Acute respiratory failure with hypoxia Active Problems:   HYPERLIPIDEMIA-MIXED   HYPERTENSION   CAD, NATIVE VESSEL   Type II or unspecified type diabetes mellitus with renal manifestations, uncontrolled   Pneumonia   HCAP (healthcare-associated pneumonia)   Acute on chronic diastolic heart failure    Time spent: < 35 minutes    Barton Dubois  [facility] Pager [phone]. If 7PM-7AM, please contact night-coverage at [email or web address], password  [facility] 12/05/2013, 3:52 PM  LOS: 3 days

## 2013-12-06 LAB — BASIC METABOLIC PANEL
Anion gap: 9 (ref 5–15)
BUN: 7 mg/dL (ref 6–23)
CHLORIDE: 100 meq/L (ref 96–112)
CO2: 31 meq/L (ref 19–32)
Calcium: 8.6 mg/dL (ref 8.4–10.5)
Creatinine, Ser: 2.81 mg/dL — ABNORMAL HIGH (ref 0.50–1.10)
GFR calc Af Amer: 19 mL/min — ABNORMAL LOW (ref >90)
GFR calc non Af Amer: 16 mL/min — ABNORMAL LOW (ref >90)
GLUCOSE: 124 mg/dL — AB (ref 70–99)
POTASSIUM: 4 meq/L (ref 3.7–5.3)
SODIUM: 140 meq/L (ref 137–147)

## 2013-12-06 LAB — GLUCOSE, CAPILLARY
GLUCOSE-CAPILLARY: 85 mg/dL (ref 70–99)
GLUCOSE-CAPILLARY: 168 mg/dL — AB (ref 70–99)
GLUCOSE-CAPILLARY: 101 mg/dL — AB (ref 70–99)
Glucose-Capillary: 35 mg/dL — CL (ref 70–99)
Glucose-Capillary: 44 mg/dL — CL (ref 70–99)
Glucose-Capillary: 216 mg/dL — ABNORMAL HIGH (ref 70–99)
Glucose-Capillary: 235 mg/dL — ABNORMAL HIGH (ref 70–99)
Glucose-Capillary: 135 mg/dL — ABNORMAL HIGH (ref 70–99)

## 2013-12-06 LAB — PHOSPHORUS: Phosphorus: 2.9 mg/dL (ref 2.3–4.6)

## 2013-12-06 MED ORDER — SODIUM CHLORIDE 0.9 % IV SOLN: 100.0000 mL | INTRAVENOUS | Status: DC | PRN

## 2013-12-06 MED ORDER — ALTEPLASE 2 MG IJ SOLR
2.0000 mg | Freq: Once | INTRAMUSCULAR | Status: DC | PRN
  Filled 2013-12-06: qty 2

## 2013-12-06 MED ORDER — GLUCOSE 40 % PO GEL
1.0000 | ORAL | Status: DC | PRN
  Administered 2013-12-06: 37.5 g via ORAL
  Filled 2013-12-06: qty 1

## 2013-12-06 MED ORDER — INSULIN ASPART PROT & ASPART (70-30 MIX) 100 UNIT/ML ~~LOC~~ SUSP: 7.0000 [IU] | Freq: Every day | SUBCUTANEOUS | Status: DC

## 2013-12-06 MED ORDER — INSULIN ASPART PROT & ASPART (70-30 MIX) 100 UNIT/ML ~~LOC~~ SUSP: 5.0000 [IU] | Freq: Every day | SUBCUTANEOUS | Status: DC

## 2013-12-06 MED ORDER — INSULIN ASPART PROT & ASPART (70-30 MIX) 100 UNIT/ML ~~LOC~~ SUSP
10.0000 [IU] | Freq: Every day | SUBCUTANEOUS | Status: DC
  Administered 2013-12-07 – 2013-12-08 (×2): 10 [IU] via SUBCUTANEOUS
  Filled 2013-12-06: qty 10

## 2013-12-06 MED ORDER — AMOXICILLIN-POT CLAVULANATE 500-125 MG PO TABS
1.0000 | ORAL_TABLET | Freq: Three times a day (TID) | ORAL | Status: DC
  Administered 2013-12-06 – 2013-12-07 (×4): 500 mg via ORAL
  Filled 2013-12-06 (×4): qty 1

## 2013-12-06 NOTE — Procedures (Signed)
   HEMODIALYSIS TREATMENT NOTE:  3.25 hour heparin-free dialysis completed via right upper arm AVF (15g/antegrade).  Goal met:  Tolerated removal of 1 liter despite 20 minutes of interrupted ultrafiltration time d/t SBP high 80s.  All blood was reinfused and hemostasis was achieved within 10 minutes.  Report given to Tedd Sias, RN.  Eloise Mula L. Mansour Balboa, RN, CDN

## 2013-12-06 NOTE — Progress Notes (Signed)
[facility] PROGRESS NOTE  [name]:[id number] DOB: 04/04/1945 DOA: 12/02/2013 PCP: Shirline Frees, MD  Assessment/Plan: #1 Acute respiratory failure with hypoxia: sats 88% on room air on admission. Likely multifactorial i.e. Right sided pleural effusion, HCAP and acute on chronic heart failure. Mild decrease sats last evening on room air. sats >98% on 2L.  Encourage IS. Mobilize. Attempt to wean oxygen.  Will continue vancomycin and cefepime day #5. Will transition to oral antibiotics.  Blood cultures no growth to date. She remains afebrile and nontoxic appearing. Will continue pulmicort, flutter valve and supportive care.   #2 HCAP - continue vancomycin and cefepime day #5. She is non-toxic appearing and hemodynamically stable. Transition to  PO antibiotics.   #3 End-stage renal disease - appreciate nephrology input. dialysis schedule as an outpatient T-T-S. Plan is for daily HD X 3 days in order to maximize fluid removal   #4 right-sided pleural effusion - may be related to acute on chronic heart failure,parapneumonic. Plan is for daily HD at least for 3 consecutive days in order to maximize fluid removal.-continue treatment with abx's.   #5 acute on chronic diastolic heart failure - with right sided pleural effusion, she is anuric. Her respiratory status stable. continue aggressive fluid removal with HD. See above  #6 CAD - stable. No chest pain. continue home medications   #7 DM - episode of hypoglycemia this am. Will decrease bedtime.   #8 Anemia / Thrombocytopenia - due to CKD. Stable at baseline. On Epogen.  Monitor. No need for transfusion     Code Status: full Family Communication: husband at bedside  Disposition Plan: home hopefully tomorrow   Consultants:  nephrology  Procedures:  Dialysis 9/12, 9/13 and 9/14  Antibiotics: Vancomycin 12/02/13>> 9/14 Cefepime 12/02/13>>9/14 augmentin 12/06/13>>   HPI/Subjective: Somewhat lethargic this am. Will  arouse to verbal stimuli. Denies pain/discomfort  Objective: Filed Vitals:   12/06/13 0509  BP: 124/52  Pulse: 73  Temp: 98.4 F (36.9 C)  Resp: 16    Intake/Output Summary (Last 24 hours) at 12/06/13 0915 Last data filed at 12/06/13 0500  Gross per 24 hour  Intake    480 ml  Output   1955 ml  Net  -1475 ml   Filed Weights   12/05/13 1320 12/05/13 1700 12/06/13 0509  Weight: 66.6 kg (146 lb 13.2 oz) 64.6 kg (142 lb 6.7 oz) 68.1 kg (150 lb 2.1 oz)    Exam:   General:  Well nourished somewhat lethargic  Cardiovascular: RRR No MGR No LE edema  Respiratory: normal effort Somewhat shallow BS distant but clear no wheeze  Abdomen: soft +BS non-tender to palpation  Musculoskeletal: no clubbing or cyanosis   Data Reviewed: Basic Metabolic Panel:  Recent Labs Lab 12/02/13 1726 12/03/13 0544 12/04/13 0552 12/05/13 0545 12/06/13 0657  NA 140 139 137 135* 140  K 3.2* 3.3* 3.7 4.2 4.0  CL 99 98 97 96 100  CO2 26 29 28 29 31   GLUCOSE 134* 161* 108* 181* 124*  BUN 6 9 15 10 7   CREATININE 2.60* 3.33* 4.61* 3.46* 2.81*  CALCIUM 8.4 8.4 8.5 8.3* 8.6  MG  --  1.8  --   --   --   PHOS  --  4.2  4.2 4.8*  --  2.9   Liver Function Tests:  Recent Labs Lab 12/02/13 1726 12/03/13 0544  AST 22  --   ALT 14  --   ALKPHOS 75  --   BILITOT 0.5  --  PROT 7.0  --   ALBUMIN 2.4* 2.3*   No results found for this basename: LIPASE, AMYLASE,  in the last 168 hours No results found for this basename: AMMONIA,  in the last 168 hours CBC:  Recent Labs Lab 12/02/13 1726 12/03/13 0544 12/04/13 0552  WBC 6.5 5.9 4.4  NEUTROABS 4.9  --   --   HGB 9.7* 9.4* 9.1*  HCT 32.0* 31.4* 30.0*  MCV 103.9* 105.0* 104.9*  PLT 137* 202 205   Cardiac Enzymes:  Recent Labs Lab 12/02/13 1726  TROPONINI <0.30   BNP (last 3 results)  Recent Labs  12/02/13 1726  PROBNP 36660.0*   CBG:  Recent Labs Lab 12/06/13 0409 12/06/13 0434 12/06/13 0508 12/06/13 0734  12/06/13 0856  GLUCAP 35* 44* 85 168* 216*    Recent Results (from the past 240 hour(s))  CULTURE, BLOOD (SINGLE)     Status: None   Collection Time    12/02/13  6:45 PM      Result Value Ref Range Status   Specimen Description BLOOD LEFT ANTECUBITAL COLLECTED BY DOCTOR MJ   Final   Special Requests BOTTLES DRAWN AEROBIC AND ANAEROBIC 12CC   Final   Culture  Setup Time     Final   Value: 12/03/2013 22:00     Performed at [facility]   Culture     Final   Value: STAPHYLOCOCCUS SPECIES (COAGULASE NEGATIVE)     Note: THE SIGNIFICANCE OF ISOLATING THIS ORGANISM FROM A SINGLE VENIPUNCTURE CANNOT BE PREDICTED WITHOUT FURTHER CLINICAL AND CULTURE CORRELATION. SUSCEPTIBILITIES AVAILABLE ONLY ON REQUEST.     Note: Gram Stain Report Called to,Read Back By and Verified With: J. LAVINDER AT 2012 ON 12/03/13 BY Ellwood Handler Performed at [facility]     Performed at [facility]   Report Status 12/05/2013 FINAL   Final  CULTURE, BLOOD (ROUTINE X 2)     Status: None   Collection Time    12/02/13  9:06 PM      Result Value Ref Range Status   Specimen Description BLOOD LEFT HAND   Final   Special Requests     Final   Value: BOTTLES DRAWN AEROBIC AND ANAEROBIC AEB 5CC ANA 2CC   Culture NO GROWTH 3 DAYS   Final   Report Status PENDING   Incomplete  CULTURE, BLOOD (ROUTINE X 2)     Status: None   Collection Time    12/02/13  9:06 PM      Result Value Ref Range Status   Specimen Description BLOOD LEFT HAND   Final   Special Requests BOTTLES DRAWN AEROBIC AND ANAEROBIC 2CC EACH   Final   Culture NO GROWTH 3 DAYS   Final   Report Status PENDING   Incomplete  MRSA PCR SCREENING     Status: None   Collection Time    12/03/13  5:03 AM      Result Value Ref Range Status   MRSA by PCR NEGATIVE  NEGATIVE Final   Comment:            The GeneXpert MRSA Assay (FDA     approved for NASAL specimens     only), is one component of a     comprehensive MRSA colonization      surveillance program. It is not     intended to diagnose MRSA     infection nor to guide or     monitor treatment for     MRSA  infections.     Studies: Dg Chest 1 View  12/05/2013   CLINICAL DATA:  Acute respiratory failure, heart failure.  EXAM: CHEST - 1 VIEW  COMPARISON:  12/02/2013 CT and chest radiographs.  FINDINGS: Cardiomegaly, cardiac valve replacement changes again noted.  Continued bilateral lower lung consolidations/ atelectasis and pleural effusions noted.  Slightly increasing interstitial opacities are noted likely represent increasing interstitial edema.  There is no evidence of pneumothorax.  IMPRESSION: Increasing interstitial pulmonary edema.  Continued bilateral lower lung consolidations/atelectasis pleural effusions.   Electronically Signed   By: Hassan Rowan M.D.   On: 12/05/2013 08:40    Scheduled Meds: . budesonide (PULMICORT) nebulizer solution  0.25 mg Nebulization BID  . carvedilol  3.125 mg Oral BID WC  . ceFEPime (MAXIPIME) IV  2 g Intravenous Q T,Th,Sa-HD  . heparin  5,000 Units Subcutaneous 3 times per day  . insulin aspart  0-9 Units Subcutaneous TID WC  . [START ON 12/07/2013] insulin aspart protamine- aspart  10 Units Subcutaneous Q breakfast  . insulin aspart protamine- aspart  5 Units Subcutaneous QHS  . mirtazapine  15 mg Oral QHS  . pantoprazole  40 mg Oral BID  . vancomycin  750 mg Intravenous Q T,Th,Sa-HD   Continuous Infusions:   Principal Problem:   Acute respiratory failure with hypoxia Active Problems:   HYPERLIPIDEMIA-MIXED   HYPERTENSION   CAD, NATIVE VESSEL   Type II or unspecified type diabetes mellitus with renal manifestations, uncontrolled   Pneumonia   HCAP (healthcare-associated pneumonia)   Acute on chronic diastolic heart failure    Time spent: 35 minutes    [facility] Pager [phone]. If 7PM-7AM, please contact night-coverage at [email or web address], password [facility] 12/06/2013, 9:15 AM  LOS: 4 days

## 2013-12-06 NOTE — Progress Notes (Signed)
Subjective: Interval History: She offers no complaint. Denies any difficulty in breathing  Objective: Vital signs in last 24 hours: Temp:  [97.6 F (36.4 C)-98.4 F (36.9 C)] 98.4 F (36.9 C) (09/14 0509) Pulse Rate:  [65-73] 73 (09/14 0509) Resp:  [16-17] 16 (09/14 0509) BP: (86-138)/(41-60) 124/52 mmHg (09/14 0509) SpO2:  [83 %-100 %] 100 % (09/14 0738) Weight:  [64.6 kg (142 lb 6.7 oz)-68.1 kg (150 lb 2.1 oz)] 68.1 kg (150 lb 2.1 oz) (09/14 0509) Weight change: -3.1 kg (-6 lb 13.4 oz)  Intake/Output from previous day: 09/13 0701 - 09/14 0700 In: 720 [P.O.:720] Out: 1955 [Urine:400] Intake/Output this shift:   Generally patient is sleepy but arousable. Patient states that she did not sleep last night that is why she is sleeping this morning Chest: Decrease breath sound and no wheezing  Heart RRR Abdomen: soft none tender No edema   Lab Results:  Recent Labs  12/04/13 0552  WBC 4.4  HGB 9.1*  HCT 30.0*  PLT 205   BMET:   Recent Labs  12/05/13 0545 12/06/13 0657  NA 135* 140  K 4.2 4.0  CL 96 100  CO2 29 31  GLUCOSE 181* 124*  BUN 10 7  CREATININE 3.46* 2.81*  CALCIUM 8.3* 8.6   No results found for this basename: PTH,  in the last 72 hours Iron Studies: No results found for this basename: IRON, TIBC, TRANSFERRIN, FERRITIN,  in the last 72 hours  Studies/Results: Dg Chest 1 View  12/05/2013   CLINICAL DATA:  Acute respiratory failure, heart failure.  EXAM: CHEST - 1 VIEW  COMPARISON:  12/02/2013 CT and chest radiographs.  FINDINGS: Cardiomegaly, cardiac valve replacement changes again noted.  Continued bilateral lower lung consolidations/ atelectasis and pleural effusions noted.  Slightly increasing interstitial opacities are noted likely represent increasing interstitial edema.  There is no evidence of pneumothorax.  IMPRESSION: Increasing interstitial pulmonary edema.  Continued bilateral lower lung consolidations/atelectasis pleural effusions.    Electronically Signed   By: Hassan Rowan M.D.   On: 12/05/2013 08:40    I have reviewed the patient's current medications.  Assessment/Plan: Problem #1 difficulty in breathing: A combination of pneumonia/pleural effusion/CHF. She is s/p dialysis with 11/2 liters fluid removal yesteday Problem #2 hypertension: Her blood pressure seems to be somewhat low normal and fluctuating and had episode of hypotension on Dialysis yesterday Problem #3 r pleural effusion: Bilatral right greater than left. Patient is started on daily dialysis for the last 2 days for possible uremic pleural dffusion Problem #4 anemia: Her hemoglobin is below but stable. On epogen Problem #5 coronary artery disease: Patient does not have any chest pain Problem #6 atrial fibrillation: Her heart rate is controlled Problem #7 hypokalemia: Potassium has corrected Problem #8 metabolic bone disease: Her calcium and phosphorus is range. Plan: We'll make arrangements for patient to get dialysis today We'll use 4K./2.5 calcium bath for 31/4 hours remove 1 liter today Check her basic metabolic panel and phosphorus in am We will continue to hold her binder.     LOS: 4 days   Mattie Novosel S 12/06/2013,9:26 AM

## 2013-12-06 NOTE — Progress Notes (Signed)
Patient seen and examined. Feeling better and breathing easier. O2 sat on RA in 88% range. Will continue o2 supplementation and repeat CXR in am. Continue course of daily HD to maximize negative volume status. Change antibiotics to PO. No fever and WBC's WNL. Case discussed with NP. Agree with assessment, plan and findings on her note.  Barton Dubois [phone]

## 2013-12-06 NOTE — Progress Notes (Signed)
Patients states she felt like her sugar was dropping. Obtained CBG. Blood sugar 35. Patient able to take PO so she received juice. CBG rechecked after 15 minutes. Blood sugar now 44. Glucose gel given. After 15 mins, blood sugar now 85. Will continue to monitor.

## 2013-12-06 NOTE — Progress Notes (Signed)
ANTIBIOTIC CONSULT NOTE - follow up  Pharmacy Consult for Vancomycin and Cefepime Indication: pneumonia  Allergies  Allergen Reactions  . Lisinopril Swelling  . Ezetimibe Swelling    ZETIA: Tongue swelling   Patient Measurements: Height: 5\' 5"  (165.1 cm) Weight: 150 lb 2.1 oz (68.1 kg) IBW/kg (Calculated) : 57  Vital Signs: Temp: 98.4 F (36.9 C) (09/14 0509) Temp src: Oral (09/14 0509) BP: 124/52 mmHg (09/14 0509) Pulse Rate: 73 (09/14 0509) Intake/Output from previous day: 09/13 0701 - 09/14 0700 In: 720 [P.O.:720] Out: 1955 [Urine:400] Intake/Output from this shift:    Labs:  Recent Labs  12/04/13 0552 12/05/13 0545 12/06/13 0657  WBC 4.4  --   --   HGB 9.1*  --   --   PLT 205  --   --   CREATININE 4.61* 3.46* 2.81*   Estimated Creatinine Clearance: 16.8 ml/min (by C-G formula based on Cr of 2.81). No results found for this basename: VANCOTROUGH, VANCOPEAK, VANCORANDOM, GENTTROUGH, GENTPEAK, GENTRANDOM, TOBRATROUGH, TOBRAPEAK, TOBRARND, AMIKACINPEAK, AMIKACINTROU, AMIKACIN,  in the last 72 hours   Microbiology: Recent Results (from the past 720 hour(s))  CULTURE, BLOOD (SINGLE)     Status: None   Collection Time    12/02/13  6:45 PM      Result Value Ref Range Status   Specimen Description BLOOD LEFT ANTECUBITAL COLLECTED BY DOCTOR MJ   Final   Special Requests BOTTLES DRAWN AEROBIC AND ANAEROBIC 12CC   Final   Culture  Setup Time     Final   Value: 12/03/2013 22:00     Performed at [facility]   Culture     Final   Value: STAPHYLOCOCCUS SPECIES (COAGULASE NEGATIVE)     Note: THE SIGNIFICANCE OF ISOLATING THIS ORGANISM FROM A SINGLE VENIPUNCTURE CANNOT BE PREDICTED WITHOUT FURTHER CLINICAL AND CULTURE CORRELATION. SUSCEPTIBILITIES AVAILABLE ONLY ON REQUEST.     Note: Gram Stain Report Called to,Read Back By and Verified With: J. LAVINDER AT 2012 ON 12/03/13 BY Ellwood Handler Performed at [facility]     Performed at [facility]   Report Status 12/05/2013 FINAL   Final  CULTURE, BLOOD (ROUTINE X 2)     Status: None   Collection Time    12/02/13  9:06 PM      Result Value Ref Range Status   Specimen Description BLOOD LEFT HAND   Final   Special Requests     Final   Value: BOTTLES DRAWN AEROBIC AND ANAEROBIC AEB 5CC ANA 2CC   Culture NO GROWTH 3 DAYS   Final   Report Status PENDING   Incomplete  CULTURE, BLOOD (ROUTINE X 2)     Status: None   Collection Time    12/02/13  9:06 PM      Result Value Ref Range Status   Specimen Description BLOOD LEFT HAND   Final   Special Requests BOTTLES DRAWN AEROBIC AND ANAEROBIC 2CC EACH   Final   Culture NO GROWTH 3 DAYS   Final   Report Status PENDING   Incomplete  MRSA PCR SCREENING     Status: None   Collection Time    12/03/13  5:03 AM      Result Value Ref Range Status   MRSA by PCR NEGATIVE  NEGATIVE Final   Comment:            The GeneXpert MRSA Assay (FDA     approved for NASAL specimens     only), is one component of  a     comprehensive MRSA colonization     surveillance program. It is not     intended to diagnose MRSA     infection nor to guide or     monitor treatment for     MRSA infections.    Anti-infectives   Start     Dose/Rate Route Frequency Ordered Stop   12/04/13 1200  ceFEPIme (MAXIPIME) 2 g in dextrose 5 % 50 mL IVPB     2 g 100 mL/hr over 30 Minutes Intravenous Every T-Th-Sa (Hemodialysis) 12/03/13 1056     12/04/13 1200  vancomycin (VANCOCIN) IVPB 750 mg/150 ml premix     750 mg 150 mL/hr over 60 Minutes Intravenous Every T-Th-Sa (Hemodialysis) 12/03/13 1056     12/02/13 2200  ceFEPIme (MAXIPIME) 1 g in dextrose 5 % 50 mL IVPB  Status:  Discontinued     1 g 100 mL/hr over 30 Minutes Intravenous 3 times per day 12/02/13 2034 12/02/13 2048   12/02/13 2100  vancomycin (VANCOCIN) IVPB 1000 mg/200 mL premix     1,000 mg 200 mL/hr over 60 Minutes Intravenous  Once 12/02/13 2051 12/02/13 2344   12/02/13 2030  ceFEPIme (MAXIPIME) 2 g in dextrose  5 % 50 mL IVPB     2 g 100 mL/hr over 30 Minutes Intravenous  Once 12/02/13 2023 12/02/13 2154   12/02/13 2030  ceFEPIme (MAXIPIME) 2 g in dextrose 5 % 50 mL IVPB  Status:  Discontinued     2 g 100 mL/hr over 30 Minutes Intravenous  Once 12/02/13 2024 12/02/13 2120     Assessment: OK for protocol, ESRD (end stage renal disease) requiring dialysis. Started dialysis in 2009 with hemodialysis and did HD for about 5 years using a R arm AVF. Changed to PD due to pt likes to travel and is independent.  Goal of Therapy:  Pre-Hemodialysis Vancomycin level goal range =15-25 mcg/ml (if transition to HD while hospitalized).  Plan:  Cefepime 2gm IV after each dialysis (plan for T-Th-Sa) Vancomycin 750mg  IV after dialysis (T-Th-Sa) F/U for any changes to dialysis schedule Measure antibiotic drug levels at steady state Follow up culture results  Hart Robinsons A 12/06/2013,9:02 AM

## 2013-12-07 ENCOUNTER — Inpatient Hospital Stay (HOSPITAL_COMMUNITY): Payer: Medicare Other

## 2013-12-07 LAB — BASIC METABOLIC PANEL
ANION GAP: 9 (ref 5–15)
BUN: 6 mg/dL (ref 6–23)
CO2: 31 meq/L (ref 19–32)
CREATININE: 2.54 mg/dL — AB (ref 0.50–1.10)
Calcium: 8.2 mg/dL — ABNORMAL LOW (ref 8.4–10.5)
Chloride: 100 mEq/L (ref 96–112)
GFR calc Af Amer: 21 mL/min — ABNORMAL LOW (ref >90)
GFR, EST NON AFRICAN AMERICAN: 18 mL/min — AB (ref >90)
GLUCOSE: 178 mg/dL — AB (ref 70–99)
Potassium: 4.2 mEq/L (ref 3.7–5.3)
Sodium: 140 mEq/L (ref 137–147)

## 2013-12-07 LAB — CULTURE, BLOOD (ROUTINE X 2)
Culture: NO GROWTH
Culture: NO GROWTH

## 2013-12-07 LAB — CBC
HCT: 29.6 % — ABNORMAL LOW (ref 36.0–46.0)
Hemoglobin: 8.9 g/dL — ABNORMAL LOW (ref 12.0–15.0)
MCH: 31.6 pg (ref 26.0–34.0)
MCHC: 30.1 g/dL (ref 30.0–36.0)
MCV: 105 fL — AB (ref 78.0–100.0)
PLATELETS: 167 10*3/uL (ref 150–400)
RBC: 2.82 MIL/uL — ABNORMAL LOW (ref 3.87–5.11)
RDW: 19.2 % — ABNORMAL HIGH (ref 11.5–15.5)
WBC: 3.9 10*3/uL — ABNORMAL LOW (ref 4.0–10.5)

## 2013-12-07 LAB — GLUCOSE, CAPILLARY
GLUCOSE-CAPILLARY: 111 mg/dL — AB (ref 70–99)
GLUCOSE-CAPILLARY: 75 mg/dL (ref 70–99)
GLUCOSE-CAPILLARY: 116 mg/dL — AB (ref 70–99)
Glucose-Capillary: 150 mg/dL — ABNORMAL HIGH (ref 70–99)

## 2013-12-07 MED ORDER — EPOETIN ALFA 4000 UNIT/ML IJ SOLN
8000.0000 [IU] | INTRAMUSCULAR | Status: DC
  Administered 2013-12-07: 8000 [IU] via INTRAVENOUS
  Filled 2013-12-07 (×2): qty 2

## 2013-12-07 MED ORDER — AMOXICILLIN-POT CLAVULANATE 500-125 MG PO TABS
1.0000 | ORAL_TABLET | Freq: Two times a day (BID) | ORAL | Status: DC
  Administered 2013-12-07 – 2013-12-08 (×2): 500 mg via ORAL
  Filled 2013-12-07 (×2): qty 1

## 2013-12-07 NOTE — Progress Notes (Signed)
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 Came to the hospital at this time complaining of shortness of breath. Patient was found to have bilateral pleural effusion and infiltrates. Admitted for treatment of healthcare associated pneumonia and acute on chronic diastolic heart failure. Patient is slowly improving but is still with fluid overload findings on chest x-ray requiring further hemodialysis treatment to maximize removal of the extra fluid. She is no longer febrile, toxic appearing or and in addition of wbc's. She is borderline normal oxygen saturations at 89-90% on room air.  Assessment/Plan: #1 Acute respiratory failure with hypoxia: sats 88% on room air on admission. Likely multifactorial i.e. Right sided pleural effusion, HCAP and acute on chronic heart failure. Resolved. Oxygen saturation level >95% on 2L. Will wean oxygen.  Encourage IS. Mobilize.  Antibiotics day #6/10.  Blood cultures no growth to date. Daily dialysis in effort to effect pleural effusion. She remains afebrile and nontoxic appearing. Will continue pulmicort, flutter valve and supportive care.   #2 HCAP - vancomycin and cefepime for five day. Transitioned to augmentin.  She remains non-toxic appearing and hemodynamically stable. Will finalize a total 10 days using Augmentin by mouth.   #3 End-stage renal disease - appreciate nephrology input. dialysis schedule as an outpatient T-T-S. Plan is for daily HD X 4 days in order to maximize fluid removal. Repeat chest xray, still demonstrated vascular congestion and  pleural effusion with just minimal changes.  #4 right-sided pleural effusion - may be related to acute on chronic heart failure,parapneumonic. Plan is for daily HD at least for 4 consecutive days in order to maximize fluid removal.-continue treatment with abx's.   #5 acute on chronic diastolic heart failure - with right sided pleural effusion, she is anuric. Her respiratory status stable. continue aggressive fluid removal with HD. See above   #6 CAD - stable. No chest pain. continue home medications   #7 DM - CBG range 135-150. Continue SSI and current dose of 70/30  #8 Anemia / Thrombocytopenia - due to CKD most likely. Stable at baseline. On Epogen. Monitor. No need for transfusion at this time   Code Status: full Family Communication: husband at bedside Disposition Plan: home hopefully tomorrow   Consultants:  nephrology  Procedures: Dialysis 9/12, 9/13, 9/14 and 9/15 See below for x-ray reports. Most recent chest x-ray still demonstrated vascular congestion, bibasilar infiltrates and bilateral pleural effusion (9/15)   Antibiotics: Vancomycin 12/02/13>> 9/14 Cefepime 12/02/13>>9/14  augmentin 12/06/13>> (plan is to complete a total of 10 days of antibiotics)   HPI/Subjective: Appears comfortable, not in distress; afebrile and breathing easier.  Objective: Filed Vitals:   12/07/13 0500  BP: 129/49  Pulse: 67  Temp: 98.6 F (37 C)  Resp: 14    Intake/Output Summary (Last 24 hours) at 12/07/13 1053 Last data filed at 12/06/13 2216  Gross per 24 hour  Intake    240 ml  Output   1007 ml  Net   -767 ml   Filed Weights   12/06/13 1200 12/06/13 1535 12/07/13 0500  Weight: 65.9 kg (145 lb 4.5 oz) 64.8 kg (142 lb 13.7 oz) 64.6 kg (142 lb 6.7 oz)    Exam:  General:  Appears comfortable, not in distress; afebrile and breathing easier.  Cardiovascular: RRR No m/g/r No LE edema PPP  Respiratory: normal effort with conversation. BS distant particularly in base on  right,  but clear  Abdomen: soft +BS non-tender to palpation no guarding  Musculoskeletal: joints without swelling/erythema   Data Reviewed: Basic Metabolic Panel:  Recent Labs Lab 12/03/13 0544 12/04/13 0552 12/05/13 0545 12/06/13 0657 12/07/13 0606  NA 139 137 135* 140 140  K 3.3* 3.7 4.2 4.0 4.2  CL 98 97 96 100 100  CO2 29 28 29 31 31   GLUCOSE 161* 108* 181* 124* 178*  BUN 9 15 10 7 6   CREATININE 3.33* 4.61* 3.46* 2.81* 2.54*  CALCIUM 8.4 8.5 8.3* 8.6 8.2*  MG 1.8  --   --   --   --   PHOS 4.2  4.2 4.8*  --  2.9  --    Liver Function Tests:  Recent Labs Lab 12/02/13 1726 12/03/13 0544  AST 22  --   ALT 14  --   ALKPHOS 75  --   BILITOT 0.5  --   PROT 7.0  --   ALBUMIN 2.4* 2.3*   CBC:  Recent Labs Lab 12/02/13 1726 12/03/13 0544 12/04/13 0552 12/07/13 0606  WBC 6.5 5.9 4.4 3.9*  NEUTROABS 4.9  --   --   --   HGB 9.7* 9.4* 9.1* 8.9*  HCT 32.0* 31.4* 30.0* 29.6*  MCV 103.9* 105.0* 104.9* 105.0*  PLT 137* 202 205 167   Cardiac Enzymes:  Recent Labs Lab 12/02/13 1726  TROPONINI <0.30   BNP (last 3 results)  Recent Labs  12/02/13 1726  PROBNP 36660.0*   CBG:  Recent Labs Lab 12/06/13 0856 12/06/13 1108 12/06/13 1633 12/06/13 2109 12/07/13 0732  GLUCAP 216* 235* 101* 135* 150*    Recent Results (from the past 240 hour(s))  CULTURE, BLOOD (SINGLE)     Status: None   Collection Time    12/02/13  6:45 PM      Result Value Ref Range Status   Specimen Description BLOOD LEFT ANTECUBITAL COLLECTED BY DOCTOR MJ   Final   Special Requests BOTTLES DRAWN AEROBIC AND ANAEROBIC 12CC   Final   Culture  Setup Time     Final   Value: 12/03/2013 22:00     Performed at [facility]   Culture     Final   Value: STAPHYLOCOCCUS SPECIES (COAGULASE NEGATIVE)     Note: THE SIGNIFICANCE OF ISOLATING THIS ORGANISM FROM A SINGLE VENIPUNCTURE CANNOT BE PREDICTED WITHOUT FURTHER CLINICAL AND CULTURE CORRELATION. SUSCEPTIBILITIES AVAILABLE ONLY ON  REQUEST.     Note: Gram Stain Report Called to,Read Back By and Verified With: J. LAVINDER AT 2012 ON 12/03/13 BY Ellwood Handler Performed at [facility]     Performed at [facility]   Report Status 12/05/2013 FINAL   Final  CULTURE, BLOOD (ROUTINE X 2)     Status: None   Collection Time    12/02/13  9:06 PM      Result Value Ref Range Status   Specimen Description BLOOD LEFT HAND   Final   Special Requests     Final   Value: BOTTLES DRAWN AEROBIC AND ANAEROBIC AEB=5CC ANA=2CC   Culture NO GROWTH 5 DAYS   Final   Report Status 12/07/2013 FINAL   Final  CULTURE, BLOOD (ROUTINE X 2)     Status: None   Collection Time    12/02/13  9:06  PM      Result Value Ref Range Status   Specimen Description BLOOD LEFT HAND   Final   Special Requests BOTTLES DRAWN AEROBIC AND ANAEROBIC 2CC EACH   Final   Culture NO GROWTH 5 DAYS   Final   Report Status 12/07/2013 FINAL   Final  MRSA PCR SCREENING     Status: None   Collection Time    12/03/13  5:03 AM      Result Value Ref Range Status   MRSA by PCR NEGATIVE  NEGATIVE Final   Comment:            The GeneXpert MRSA Assay (FDA     approved for NASAL specimens     only), is one component of a     comprehensive MRSA colonization     surveillance program. It is not     intended to diagnose MRSA     infection nor to guide or     monitor treatment for     MRSA infections.     Studies: No results found.  Scheduled Meds: . amoxicillin-clavulanate  1 tablet Oral BID  . budesonide (PULMICORT) nebulizer solution  0.25 mg Nebulization BID  . carvedilol  3.125 mg Oral BID WC  . epoetin alfa  8,000 Units Intravenous Q T,Th,Sa-HD  . heparin  5,000 Units Subcutaneous 3 times per day  . insulin aspart  0-9 Units Subcutaneous TID WC  . insulin aspart protamine- aspart  10 Units Subcutaneous Q breakfast  . insulin aspart protamine- aspart  5 Units Subcutaneous QHS  . mirtazapine  15 mg Oral QHS  . pantoprazole  40 mg Oral BID    Continuous Infusions:   Principal Problem:   Acute respiratory failure with hypoxia Active Problems:   HYPERLIPIDEMIA-MIXED   HYPERTENSION   CAD, NATIVE VESSEL   Type II or unspecified type diabetes mellitus with renal manifestations, uncontrolled   Pneumonia   HCAP (healthcare-associated pneumonia)   Acute on chronic diastolic heart failure   Time spent: 30 minutes    Barton Dubois [facility] Pager [phone]. If 7PM-7AM, please contact night-coverage at [email or web address], password [email or web address] 12/07/2013, 10:53 AM  LOS: 5 days

## 2013-12-07 NOTE — Progress Notes (Addendum)
CBG 116; 75 was the one before that. Notified Baltazar Najjar, [facility], requesting to hold Insulin Aspart 70/30 HS dose. Dr Baltazar Najjar said okay to hold night dose. Will continue to monitor pt. Family at bedside. Pt resting comfortably in bed @ this time in lowest position & call bell within reach. No complaints per pt. Will continue to monitor pt frequently.

## 2013-12-07 NOTE — Evaluation (Signed)
Physical Therapy Evaluation Patient Details Name: [name] MRN: [id number] DOB: [birth year] Today's Date: 12/07/2013   History of Present Illness  HPI: [name] is a 68 y.o. 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 She presents to the emergency room with a chief complaint of shortness of breath. She's been feeling short-winded for the past one to 2 days and is unable to lay flat without getting dyspneic. She went to get her hemodialysis today, and she tells me that she has no fluid pulled all of and in fact she got 1 or 1.5 L. She doesn't her dry weight to 68.5 kg. She denies any fever or chills. She denies any cough or chest congestion. She has no chest pain. She denies any lightheadedness or dizziness. She has no abdominal pain, nausea vomiting or diarrhea. In the emergency room, she had a chest x-ray and a CT scan which showed healthcare associated pneumonia as well as large right-sided pleural effusion. In the emergency room she is breathing comfortable on 2 L nasal cannula (which he uses at home at nighttime only), satting in the mid 90s. Hospitalist asked for admission.  Husband reports that pt was able to ambulate in the home with hand held assistance prior to admission.  Clinical Impression   Pt is found to be very somnolent in the bed, husband present.  He reports that she slept very poorly last night and the anticipation is that she will receive hemodialysis again this afternoon.  Pt was able to awaken for very short periods of time, only enough for muscle testing in the bed and minimal therapeutic exercise.  She is found to be generally weak and deconditioned, however she was probably not able to give full effort  due to drowsiness.  We were unable to transfer her to edge of bed or a chair.  Husband reports that she was able to stand at bedside earlier today with his assistance in order to reposition her in the bed.  Husband and family provide very good support at home and I would anticipate that she will be able to return home at d/c.  Hopefully we will be able to fully mobilize her tomorrow.    Follow Up Recommendations Home health PT;Supervision/Assistance - 24 hour    Equipment Recommendations  None recommended by PT    Recommendations for Other Services       Precautions / Restrictions Precautions Precautions: Fall Restrictions Weight Bearing Restrictions: No      Mobility  Bed Mobility Overal bed mobility: Needs Assistance Bed Mobility: Rolling Rolling: Min assist         General bed mobility comments: husband reports that he found pt sitting at edge of bed during the night...she was too fatigued to attempt to get out of bed this morining  Transfers Overall transfer level:  (not tested due to fatigue)                  Ambulation/Gait                Stairs            Wheelchair Mobility    Modified Rankin (Stroke Patients Only)       Balance Overall balance assessment:  (unable to test due to  fatigue)                                           Pertinent Vitals/Pain Pain Assessment: No/denies pain    Home Living Family/patient expects to be discharged to:: Private residence Living Arrangements: Spouse/significant other Available Help at Discharge: Family;Available 24 hours/day Type of Home: House Home Access: Stairs to enter Entrance Stairs-Rails: Psychiatric nurse of Steps: 5 Home Layout: One level Home Equipment: Walker - 4 wheels;Shower seat Additional Comments: in home, husband normally provides hand held assist for gait    Prior Function Level of Independence: Needs assistance   Gait / Transfers  Assistance Needed: used 4 wheeled walker outside of home, hand held assist in home...transferred with standby assist  ADL's / Homemaking Assistance Needed: min assist needed with ADLs        Hand Dominance   Dominant Hand: Right    Extremity/Trunk Assessment   Upper Extremity Assessment: Generalized weakness           Lower Extremity Assessment: Generalized weakness      Cervical / Trunk Assessment: Normal  Communication   Communication: No difficulties  Cognition Arousal/Alertness: Awake/alert Behavior During Therapy: WFL for tasks assessed/performed Overall Cognitive Status: Within Functional Limits for tasks assessed                      General Comments      Exercises General Exercises - Lower Extremity Ankle Circles/Pumps: AROM;Both;10 reps;Supine Short Arc Quad: AROM;Both;10 reps;Supine Heel Slides: AAROM;Both;10 reps;Supine Hip ABduction/ADduction: AAROM;Both;10 reps;Supine      Assessment/Plan    PT Assessment Patient needs continued PT services  PT Diagnosis Difficulty walking;Generalized weakness   PT Problem List Decreased strength;Decreased activity tolerance;Decreased mobility  PT Treatment Interventions Gait training;Functional mobility training;Therapeutic exercise   PT Goals (Current goals can be found in the Care Plan section) Acute Rehab PT Goals Patient Stated Goal: husband plans to have pt return home and get her walking PT Goal Formulation: With family Time For Goal Achievement: 12/21/13 Potential to Achieve Goals: Good    Frequency Min 3X/week   Barriers to discharge        Co-evaluation               End of Session Equipment Utilized During Treatment: Gait belt Activity Tolerance: Patient limited by fatigue Patient left: in bed;with call bell/phone within reach;with bed alarm set;with family/visitor present Nurse Communication: Mobility status         Time: 6384-5364 PT Time Calculation (min): 33  min   Charges:   PT Evaluation $Initial PT Evaluation Tier I: 1 Procedure     PT G CodesDemetrios Isaacs L 12/07/2013, 9:50 AM

## 2013-12-07 NOTE — Progress Notes (Signed)
Husband changed dressing to LLQ. He does this at home as well. Husband insisted that he do it. Dressing now clean, dry & intact.

## 2013-12-07 NOTE — Progress Notes (Signed)
Central Telemetry notified nurse regarding rhythm going in & out of A Fib. Notified [facility] d/t not having history noted of a fib. Right now, rhythm is NSR. Awaiting to hear from dr and will monitor pt frequently throughout night. Bed is in lowest position & call bell is within reach.

## 2013-12-07 NOTE — Progress Notes (Signed)
LOGYN DEDOMINICIS  MRN: [id number]  DOB/AGE: October 22, 1945 68 y.o.  Primary Care Physician:[name], Gwyndolyn Saxon, MD  Admit date: 12/02/2013  Chief Complaint:  Chief Complaint  Patient presents with  . Shortness of Breath    S-Pt presented on  12/02/2013 with  Chief Complaint  Patient presents with  . Shortness of Breath  .  Pt offers no new complaints.  Pt husband is present in the room and says that he feels her breathing is better.   Meds . amoxicillin-clavulanate  1 tablet Oral TID  . budesonide (PULMICORT) nebulizer solution  0.25 mg Nebulization BID  . carvedilol  3.125 mg Oral BID WC  . heparin  5,000 Units Subcutaneous 3 times per day  . insulin aspart  0-9 Units Subcutaneous TID WC  . insulin aspart protamine- aspart  10 Units Subcutaneous Q breakfast  . insulin aspart protamine- aspart  5 Units Subcutaneous QHS  . mirtazapine  15 mg Oral QHS  . pantoprazole  40 mg Oral BID      Physical Exam: Vital signs in last 24 hours: Temp:  [98.6 F (37 C)-98.7 F (37.1 C)] 98.6 F (37 C) (09/15 0500) Pulse Rate:  [58-79] 67 (09/15 0500) Resp:  [14-20] 14 (09/15 0500) BP: (88-129)/(42-96) 129/49 mmHg (09/15 0500) SpO2:  [93 %-100 %] 98 % (09/15 0726) Weight:  [142 lb 6.7 oz (64.6 kg)-145 lb 4.5 oz (65.9 kg)] 142 lb 6.7 oz (64.6 kg) (09/15 0500) Weight change: -1 lb 8.7 oz (-0.7 kg) Last BM Date: 12/06/13  Intake/Output from previous day: 09/14 0701 - 09/15 0700 In: 480 [P.O.:480] Out: 1007      Physical Exam: General- pt is awake,alert,follows comands Resp- No acute REsp distress, Decreased Bs at bases CVS- S1S2 regular in rate and rhythm GIT- BS+, soft, NT, ND EXT- NO LE Edema, Cyanosis Access Right AVF +  Lab Results: CBC  Recent Labs  12/07/13 0606  WBC 3.9*  HGB 8.9*  HCT 29.6*  PLT 167    BMET  Recent Labs  12/06/13 0657 12/07/13 0606  NA 140 140  K 4.0 4.2  CL 100 100  CO2 31 31  GLUCOSE 124* 178*  BUN 7 6  CREATININE 2.81* 2.54*   CALCIUM 8.6 8.2*    MICRO Recent Results (from the past 240 hour(s))  CULTURE, BLOOD (SINGLE)     Status: None   Collection Time    12/02/13  6:45 PM      Result Value Ref Range Status   Specimen Description BLOOD LEFT ANTECUBITAL COLLECTED BY DOCTOR MJ   Final   Special Requests BOTTLES DRAWN AEROBIC AND ANAEROBIC 12CC   Final   Culture  Setup Time     Final   Value: 12/03/2013 22:00     Performed at [facility]   Culture     Final   Value: STAPHYLOCOCCUS SPECIES (COAGULASE NEGATIVE)     Note: THE SIGNIFICANCE OF ISOLATING THIS ORGANISM FROM A SINGLE VENIPUNCTURE CANNOT BE PREDICTED WITHOUT FURTHER CLINICAL AND CULTURE CORRELATION. SUSCEPTIBILITIES AVAILABLE ONLY ON REQUEST.     Note: Gram Stain Report Called to,Read Back By and Verified With: J. LAVINDER AT 2012 ON 12/03/13 BY Ellwood Handler Performed at [facility]     Performed at [facility]   Report Status 12/05/2013 FINAL   Final  CULTURE, BLOOD (ROUTINE X 2)     Status: None   Collection Time    12/02/13  9:06 PM      Result Value  Ref Range Status   Specimen Description BLOOD LEFT HAND   Final   Special Requests     Final   Value: BOTTLES DRAWN AEROBIC AND ANAEROBIC AEB 5CC ANA 2CC   Culture NO GROWTH 4 DAYS   Final   Report Status PENDING   Incomplete  CULTURE, BLOOD (ROUTINE X 2)     Status: None   Collection Time    12/02/13  9:06 PM      Result Value Ref Range Status   Specimen Description BLOOD LEFT HAND   Final   Special Requests BOTTLES DRAWN AEROBIC AND ANAEROBIC 2CC EACH   Final   Culture NO GROWTH 4 DAYS   Final   Report Status PENDING   Incomplete  MRSA PCR SCREENING     Status: None   Collection Time    12/03/13  5:03 AM      Result Value Ref Range Status   MRSA by PCR NEGATIVE  NEGATIVE Final   Comment:            The GeneXpert MRSA Assay (FDA     approved for NASAL specimens     only), is one component of a     comprehensive MRSA colonization     surveillance program. It is  not     intended to diagnose MRSA     infection nor to guide or     monitor treatment for     MRSA infections.      Lab Results  Component Value Date   PTH 76.1* 01/12/2007   CALCIUM 8.2* 12/07/2013   CAION 1.07* 08/19/2013   PHOS 2.9 12/06/2013        Impression: 1)Renal  ESRD on HD On TTS schedule as outpt On Daily Dialysis as inpt sec to volume related issues. Will dialyze again today.   2)HTN Medication- On Alpha and beta Blockers  3)Anemia HGb just below goal (9--11) On epo  4)CKD Mineral-Bone Disorder  Phosphorus on lower side.  not on any binders Calcium at goal.  5)Resp- admitted with resp failure. HCAP on Antibiotucs Pleural effusion  PMD following  6)Electrolytes Normokalemic NOrmonatremic   7)Acid base Co2 at goal    Plan:  Will dialyze again today. Will ask for CXR in am to see the change in fluid status/pul congestion/pleural fluid .      Kynnedy Carreno S 12/07/2013, 8:58 AM

## 2013-12-07 NOTE — Procedures (Signed)
HEMODIALYSIS TREATMENT NOTE:  3.5 hour heparin-free dialysis completed via right upper arm AVF (15g/antegrade). Goal met: Tolerated removal of 2 liters.  UF interrupted x15 min for SBP high 80s/asymptomatic.  EPO 8Ku given.  All blood was reinfused and hemostasis was achieved within 10 minutes. Report given to Janace Aris, RN.  Rockwell Alexandria, RN, CDN

## 2013-12-08 ENCOUNTER — Telehealth: Payer: Self-pay | Admitting: Gastroenterology

## 2013-12-08 DIAGNOSIS — J9600 Acute respiratory failure, unspecified whether with hypoxia or hypercapnia: Secondary | ICD-10-CM

## 2013-12-08 LAB — CBC
HCT: 31.5 % — ABNORMAL LOW (ref 36.0–46.0)
HEMOGLOBIN: 9.3 g/dL — AB (ref 12.0–15.0)
MCH: 31.7 pg (ref 26.0–34.0)
MCHC: 29.5 g/dL — ABNORMAL LOW (ref 30.0–36.0)
MCV: 107.5 fL — ABNORMAL HIGH (ref 78.0–100.0)
PLATELETS: 158 10*3/uL (ref 150–400)
RBC: 2.93 MIL/uL — AB (ref 3.87–5.11)
RDW: 18.6 % — ABNORMAL HIGH (ref 11.5–15.5)
WBC: 4 10*3/uL (ref 4.0–10.5)

## 2013-12-08 LAB — GLUCOSE, CAPILLARY
GLUCOSE-CAPILLARY: 138 mg/dL — AB (ref 70–99)
GLUCOSE-CAPILLARY: 72 mg/dL (ref 70–99)

## 2013-12-08 LAB — BASIC METABOLIC PANEL
ANION GAP: 9 (ref 5–15)
BUN: 5 mg/dL — ABNORMAL LOW (ref 6–23)
CHLORIDE: 99 meq/L (ref 96–112)
CO2: 31 mEq/L (ref 19–32)
Calcium: 8.1 mg/dL — ABNORMAL LOW (ref 8.4–10.5)
Creatinine, Ser: 2.41 mg/dL — ABNORMAL HIGH (ref 0.50–1.10)
GFR calc Af Amer: 22 mL/min — ABNORMAL LOW (ref >90)
GFR calc non Af Amer: 19 mL/min — ABNORMAL LOW (ref >90)
Glucose, Bld: 139 mg/dL — ABNORMAL HIGH (ref 70–99)
Potassium: 4.5 mEq/L (ref 3.7–5.3)
SODIUM: 139 meq/L (ref 137–147)

## 2013-12-08 MED ORDER — AMOXICILLIN-POT CLAVULANATE 500-125 MG PO TABS: 1.0000 | ORAL_TABLET | Freq: Two times a day (BID) | ORAL | Status: DC

## 2013-12-08 MED ORDER — INSULIN ASPART PROT & ASPART (70-30 MIX) 100 UNIT/ML ~~LOC~~ SUSP: 10.0000 [IU] | Freq: Every day | SUBCUTANEOUS | Status: DC

## 2013-12-08 NOTE — Discharge Summary (Signed)
Physician Discharge Summary  HYDEIA MCATEE STM:[id number] DOB: 12-Nov-1945 DOA: 12/02/2013  PCP: Shirline Frees, MD  Admit date: 12/02/2013 Discharge date: 12/08/2013  Time spent: 40 minutes  Recommendations for Outpatient Follow-up:  1. Dr. Azalia Bilis PCP follow up 12/13/13. Evaluate respiratory status. Consider repeat chest xray to trend right pleural effusion. Also evaluate CBG readings as insulin regimen adjusted for low reading during hospitalization 2. Home Health PT 3. (include homehealth, outpatient follow-up instructions, specific recommendations for PCP to follow-up on, etc.)  Discharge Diagnoses:  Principal Problem:   Acute respiratory failure with hypoxia Active Problems:   HYPERLIPIDEMIA-MIXED   HYPERTENSION   CAD, NATIVE VESSEL   Type II or unspecified type diabetes mellitus with renal manifestations, uncontrolled   Pneumonia   HCAP (healthcare-associated pneumonia)   Acute on chronic diastolic heart failure   Discharge Condition: stable  Diet recommendation: carb nidified  Filed Weights   12/07/13 1455 12/07/13 1845 12/08/13 0500  Weight: 64.7 kg (142 lb 10.2 oz) 62.8 kg (138 lb 7.2 oz) 66.2 kg (145 lb 15.1 oz)    History of present illness:  [name] is a 68 y.o. 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 She presented to the emergency room on 12/02/13 with a chief complaint of shortness of breath. She'd been feeling short-winded for  one to 2 days and was unable to lay flat without getting dyspneic. She went to get her hemodialysis on day of admission and she reported  no fluid pulled of f and in fact she got 1 or 1.5 L.  She denied any fever or chills. She denied any cough or chest  congestion. She had no chest pain. She denied any lightheadedness or dizziness. She had no abdominal pain, nausea vomiting or diarrhea. In the emergency room, she had a chest x-ray and a CT scan which showed possible healthcare associated pneumonia as well as large right-sided pleural effusion. In the emergency room she was breathing comfortable on 2 L nasal cannula (which he uses at home at nighttime only), satting in the mid 90s.   Hospital Course:  #1 Acute respiratory failure with hypoxia: sats 88% on room air on admission. Likely multifactorial i.e. Right sided pleural effusion, HCAP and acute on chronic heart failure. Resolved at discharge. Oxygen saturation level >93% on room air at discharge.  Blood cultures no growth to date. Daily dialysis for 5 days in effort to effect pleural effusion with good results. Recommend repeat chest xray at PCP follow up visit on 12/13/13 to trend right pleural effusion.   She remained afebrile and nontoxic appearing. Augmentin for 5 more days to complete 10 day course.    #2 HCAP - vancomycin and cefepime for five day. Will discharge with  augmentin to complete 10 day courese. She remained non-toxic appearing and hemodynamically stable.    #3 End-stage renal disease - evaluated by  Nephrology who recommended daily dialysis for 5 days. Outpatient schedule T-T-S and this will be resumed at discharge.    #4 right-sided pleural effusion - may be related to acute on chronic heart failure,parapneumonic. See #1. Recommend repeat chest xray 12/13/13 to trend    #5 acute on chronic diastolic heart failure - with right sided pleural effusion, she is anuric. Her respiratory status stable at  discharge. Provided with  aggressive fluid removal with HD. See above   #6 CAD - stable. No chest pain.    #7 DM - home regimen adjusted due to low am CBG readings. Will discontinue night time insulin and reduce am 70/30 to 10 units.   #8 Anemia / Thrombocytopenia - due to CKD most  likely. Stable at baseline. On Epogen. Monitor.   #9 afib: hx of same. ? On tele night of 12/08/13. Tele yields SR. Chart review indicates no anticoagulation at this time waiting for GI evaluation.  Procedures: Dialysis 9/12, 9/13, 9/14 and 9/15  See below for x-ray reports. Most recent chest x-ray still demonstrated vascular congestion, bibasilar infiltrates and bilateral pleural effusion (9/15)   Consultations:  nephrology  Discharge Exam: Filed Vitals:   12/08/13 0500  BP: 109/55  Pulse: 62  Temp: 98.6 F (37 C)  Resp: 18    General: well nourished appears comfortable sitting on side of bed Cardiovascular: RRR No MGR No LE edema Respiratory: normal effort BS diminished on right but with improved air flow otherwise. No wheeze Abdomen: soft +BS non-tender to palpation  Discharge Instructions You were cared for by a hospitalist during your hospital stay. If you have any questions about your discharge medications or the care you received while you were in the hospital after you are discharged, you can call the unit and asked to speak with the hospitalist on call if the hospitalist that took care of you is not available. Once you are discharged, your primary care physician will handle any further medical issues. Please note that NO REFILLS for any discharge medications will be authorized once you are discharged, as it is imperative that you return to your primary care physician (or establish a relationship with a primary care physician if you do not have one) for your aftercare needs so that they can reassess your need for medications and monitor your lab values.  Discharge Instructions   Diet - low sodium heart healthy    Complete by:  As directed      Discharge instructions    Complete by:  As directed   Take medications as directed.  See PCP 12/13/13 at 12:15 as scheduled Dialysis schedule per usual     Increase activity slowly    Complete by:  As directed           Current  Discharge Medication List    START taking these medications   Details  amoxicillin-clavulanate (AUGMENTIN) 500-125 MG per tablet Take 1 tablet (500 mg total) by mouth 2 (two) times daily. Qty: 10 tablet, Refills: 0      CONTINUE these medications which have CHANGED   Details  insulin aspart protamine- aspart (NOVOLOG MIX 70/30) (70-30) 100 UNIT/ML injection Inject 0.1 mLs (10 Units total) into the skin daily with breakfast. Qty: 10 mL, Refills: 11      CONTINUE these medications which have NOT CHANGED   Details  ALPRAZolam (XANAX) 0.5 MG tablet Take 0.5 mg by mouth at bedtime as needed for anxiety.    carvedilol (COREG) 3.125 MG tablet Take 3.125 mg by mouth 2 (two) times daily with a meal.    EPINEPHrine 0.3 mg/0.3 mL IJ SOAJ injection Inject 0.3 mg into the muscle daily as needed (allergic reaction).    meclizine (ANTIVERT) 25 MG tablet Take 12.5 mg by mouth 2 (two) times daily as needed for dizziness (Vertigo).     mirtazapine (REMERON) 15 MG tablet Take 15 mg by mouth at  bedtime.    multivitamin (RENA-VIT) TABS tablet Take 1 tablet by mouth every morning.    ondansetron (ZOFRAN) 4 MG tablet Take 1 tablet (4 mg total) by mouth every 6 (six) hours as needed for nausea. Qty: 20 tablet, Refills: 0    pantoprazole (PROTONIX) 40 MG tablet Take 1 tablet (40 mg total) by mouth 2 (two) times daily. Qty: 180 tablet, Refills: 3       Allergies  Allergen Reactions  . Lisinopril Swelling  . Ezetimibe Swelling    ZETIA: Tongue swelling   Follow-up Information   Follow up with Shirline Frees, MD On 12/13/2013. (appointment at 12:15)    Specialty:  Family Medicine   Contact information:   [place] [address] [phone]        The results of significant diagnostics from this hospitalization (including imaging, microbiology, ancillary and laboratory) are listed below for reference.    Significant Diagnostic Studies: Dg Chest 1 View  12/05/2013    CLINICAL DATA:  Acute respiratory failure, heart failure.  EXAM: CHEST - 1 VIEW  COMPARISON:  12/02/2013 CT and chest radiographs.  FINDINGS: Cardiomegaly, cardiac valve replacement changes again noted.  Continued bilateral lower lung consolidations/ atelectasis and pleural effusions noted.  Slightly increasing interstitial opacities are noted likely represent increasing interstitial edema.  There is no evidence of pneumothorax.  IMPRESSION: Increasing interstitial pulmonary edema.  Continued bilateral lower lung consolidations/atelectasis pleural effusions.   Electronically Signed   By: Hassan Rowan M.D.   On: 12/05/2013 08:40   Dg Chest 2 View  12/02/2013   CLINICAL DATA:  Shortness of breath. Weakness. Coronary artery disease. Ovarian carcinoma.  EXAM: CHEST  2 VIEW  COMPARISON:  10/24/2013  FINDINGS: Cardiomegaly is stable. Moderate right pleural effusion shows mild increase in size. Small left pleural effusion shows no significant change. Pulmonary interstitial prominence again seen bilaterally, suspicious for mild interstitial edema. 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  IMPRESSION: Stable cardiomegaly and probable diffuse interstitial edema.  Bilateral pleural effusions and bibasilar atelectasis, with mild increase in the right lung base.   Electronically Signed   By: Earle Gell M.D.   On: 12/02/2013 18:15   Ct Chest W Contrast  12/02/2013   CLINICAL DATA:  Shortness of breath  EXAM: CT CHEST WITH CONTRAST  TECHNIQUE: Multidetector CT imaging of the chest was performed during intravenous contrast administration.  CONTRAST:  15mL OMNIPAQUE IOHEXOL 300 MG/ML  SOLN  COMPARISON:  Chest x-ray from earlier in the same day  FINDINGS: Consolidation is noted in the right middle lobe and right lower lobe. Patchy infiltrate is noted in the left lower lobe. A large right-sided pleural effusion is seen similar to that noted on prior chest x-ray. No parenchymal nodule or mass lesion is seen.  The thoracic aorta and its branches are were within normal limits. The cardiac shadow is significantly enlarged. Mitral valve replacement is noted. Heavy coronary calcifications are seen is both changes of prior coronary bypass grafting. No significant hilar or mediastinal adenopathy is noted. The visualized upper abdomen is within normal limits. No acute bony abnormality is seen.  IMPRESSION: Bibasilar consolidation worse on the right than the left. Large right-sided pleural effusion is noted. Although not mentioned in the body air the report there is some generalized increased density within the lungs likely related to underlying pulmonary edema.   Electronically Signed   By: Inez Catalina M.D.   On: 12/02/2013 19:39   Dg  Chest Port 1 View  12/07/2013   CLINICAL DATA:  Aspiration pneumonia  EXAM: PORTABLE CHEST - 1 VIEW  COMPARISON:  12/05/2013  FINDINGS: The cardiac shadow is again enlarged. Bibasilar infiltrates with associated effusions are seen right greater than left. The overall appearance is stable. Diffuse edema is again identified as well. No acute bony abnormality is seen.  IMPRESSION: No change in the interval from the prior exam.   Electronically Signed   By: Inez Catalina M.D.   On: 12/07/2013 14:39    Microbiology: Recent Results (from the past 240 hour(s))  CULTURE, BLOOD (SINGLE)     Status: None   Collection Time    12/02/13  6:45 PM      Result Value Ref Range Status   Specimen Description BLOOD LEFT ANTECUBITAL COLLECTED BY DOCTOR MJ   Final   Special Requests BOTTLES DRAWN AEROBIC AND ANAEROBIC 12CC   Final   Culture  Setup Time     Final   Value: 12/03/2013 22:00     Performed at [facility]   Culture     Final   Value: STAPHYLOCOCCUS SPECIES (COAGULASE NEGATIVE)     Note: THE SIGNIFICANCE OF ISOLATING THIS ORGANISM FROM A SINGLE VENIPUNCTURE CANNOT BE PREDICTED WITHOUT FURTHER CLINICAL AND CULTURE CORRELATION. SUSCEPTIBILITIES AVAILABLE ONLY ON REQUEST.     Note:  Gram Stain Report Called to,Read Back By and Verified With: J. LAVINDER AT 2012 ON 12/03/13 BY Ellwood Handler Performed at [facility]     Performed at [facility]   Report Status 12/05/2013 FINAL   Final  CULTURE, BLOOD (ROUTINE X 2)     Status: None   Collection Time    12/02/13  9:06 PM      Result Value Ref Range Status   Specimen Description BLOOD LEFT HAND   Final   Special Requests     Final   Value: BOTTLES DRAWN AEROBIC AND ANAEROBIC AEB=5CC ANA=2CC   Culture NO GROWTH 5 DAYS   Final   Report Status 12/07/2013 FINAL   Final  CULTURE, BLOOD (ROUTINE X 2)     Status: None   Collection Time    12/02/13  9:06 PM      Result Value Ref Range Status   Specimen Description BLOOD LEFT HAND   Final   Special Requests BOTTLES DRAWN AEROBIC AND ANAEROBIC 2CC EACH   Final   Culture NO GROWTH 5 DAYS   Final   Report Status 12/07/2013 FINAL   Final  MRSA PCR SCREENING     Status: None   Collection Time    12/03/13  5:03 AM      Result Value Ref Range Status   MRSA by PCR NEGATIVE  NEGATIVE Final   Comment:            The GeneXpert MRSA Assay (FDA     approved for NASAL specimens     only), is one component of a     comprehensive MRSA colonization     surveillance program. It is not     intended to diagnose MRSA     infection nor to guide or     monitor treatment for     MRSA infections.     Labs: Basic Metabolic Panel:  Recent Labs Lab 12/03/13 0544 12/04/13 0552 12/05/13 0545 12/06/13 0657 12/07/13 0606 12/08/13 0535  NA 139 137 135* 140 140 139  K 3.3* 3.7 4.2 4.0 4.2 4.5  CL 98 97 96 100  100 99  CO2 29 28 29 31 31 31   GLUCOSE 161* 108* 181* 124* 178* 139*  BUN 9 15 10 7 6  5*  CREATININE 3.33* 4.61* 3.46* 2.81* 2.54* 2.41*  CALCIUM 8.4 8.5 8.3* 8.6 8.2* 8.1*  MG 1.8  --   --   --   --   --   PHOS 4.2  4.2 4.8*  --  2.9  --   --    Liver Function Tests:  Recent Labs Lab 12/02/13 1726 12/03/13 0544  AST 22  --   ALT 14  --   ALKPHOS 75  --    BILITOT 0.5  --   PROT 7.0  --   ALBUMIN 2.4* 2.3*   No results found for this basename: LIPASE, AMYLASE,  in the last 168 hours No results found for this basename: AMMONIA,  in the last 168 hours CBC:  Recent Labs Lab 12/02/13 1726 12/03/13 0544 12/04/13 0552 12/07/13 0606 12/08/13 0535  WBC 6.5 5.9 4.4 3.9* 4.0  NEUTROABS 4.9  --   --   --   --   HGB 9.7* 9.4* 9.1* 8.9* 9.3*  HCT 32.0* 31.4* 30.0* 29.6* 31.5*  MCV 103.9* 105.0* 104.9* 105.0* 107.5*  PLT 137* 202 205 167 158   Cardiac Enzymes:  Recent Labs Lab 12/02/13 1726  TROPONINI <0.30   BNP: BNP (last 3 results)  Recent Labs  12/02/13 1726  PROBNP 36660.0*   CBG:  Recent Labs Lab 12/07/13 1136 12/07/13 1851 12/07/13 2037 12/08/13 0734 12/08/13 1132  GLUCAP 111* 75 116* 138* 72       Signed:  Tarik Teixeira M  [facility] 12/08/2013, 12:17 PM

## 2013-12-08 NOTE — Progress Notes (Signed)
Patients O2 stats on RA maintained at 96%,  When ambulating on RA, stats did not drop below 93%.    Patient states she felt "fine and did good" without wearing O2 while ambulating.  No need for continuous O2 noted.

## 2013-12-08 NOTE — Discharge Summary (Signed)
Patient seen, independently examined and chart reviewed. I agree with exam, assessment and plan discussed with Dyanne Carrel, NP.  68 year old woman admitted 9/10 with history of shortness of breath and orthopnea. Admitted for healthcare associated pneumonia, right-sided pleural effusion, acute on chronic diastolic heart failure. She was treated with empiric antibiotics with rapid improvement. Because of significant volume overload secondary to outpatient under dialysis, she was treated with a daily dialysis strategy with significant improvement (total 7.5 L removed).  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 No hypoxia on rest or with ambuation.  Question of atrial fibrillation on the monitor overnight.  Borderline low blood sugar.  Feels very well, wants to go home.  Objective: Afebrile, vital signs stable. No hypoxia.  Gen. Appears calm, comfortable. Speech fluent and clear.  Cardiovascular. Regular rate and rhythm. No murmur, rub gallop. No lower extremity edema.  Respiratory. Clear to auscultation bilaterally. No wheezes, rales or rhonchi. Normal respiratory effort.     I/O: -5.4 L since admission. Weights do not appear to be reliable.  Basic metabolic panel consistent with end-stage renal disease. Hemoglobin stable at 9.3.  ID: 1/3 bottle coag negative Staph, contaminant  Overall she appears much improved. Acute hypoxic respiratory failure has resolved at this point,  multifactorial in nature, favor predominantly from volume overload but also treated for healthcare associated pneumonia. Right-sided pleural effusion stable radiographically, however she has excellent air movement in the right and no hypoxia. She does use oxygen at night and is now stable for discharge. She will complete a course of oral antibiotics.  There was a question raised of atrial fibrillation overnight. I do not see any evidence of this on telemetry. She remains in sinus rhythm. It is of note that this would not be a new diagnosis as the patient was noted to have atrial fibrillation when she was admitted for stroke at the end of May. Her stroke was felt to be embolic from atrial fibrillation and anticoagulation was recommended in the future. She was recently seen by neurology September 2 at which time the plan was to wait for GI evaluation before restarting antiplatelet agents or anticoagulation. The patient confirms this knowledge.  In regard to her diabetes her blood sugar has been well controlled in a.m. long-acting insulin only. Plan to confirm her outpatient dosing regimen which she can continue to manage based on her oral intake. Would favor discharge home on long-acting insulin 10 units in the morning, withhold evening blood sugars and close outpatient followup.   Murray Hodgkins, MD [facility] [phone]

## 2013-12-08 NOTE — Progress Notes (Signed)
TAFFIE ECKMANN  MRN: [id number]  DOB/AGE: 10/02/1945 68 y.o.  Primary Care Physician:[name], Gwyndolyn Saxon, MD  Admit date: 12/02/2013  Chief Complaint:  Chief Complaint  Patient presents with  . Shortness of Breath    S-Pt presented on  12/02/2013 with  Chief Complaint  Patient presents with  . Shortness of Breath  .  Pt offers no new complaints.    Meds . amoxicillin-clavulanate  1 tablet Oral BID  . budesonide (PULMICORT) nebulizer solution  0.25 mg Nebulization BID  . carvedilol  3.125 mg Oral BID WC  . epoetin alfa  8,000 Units Intravenous Q T,Th,Sa-HD  . heparin  5,000 Units Subcutaneous 3 times per day  . insulin aspart  0-9 Units Subcutaneous TID WC  . insulin aspart protamine- aspart  10 Units Subcutaneous Q breakfast  . insulin aspart protamine- aspart  5 Units Subcutaneous QHS  . mirtazapine  15 mg Oral QHS  . pantoprazole  40 mg Oral BID      Physical Exam: Vital signs in last 24 hours: Temp:  [98.3 F (36.8 C)-98.8 F (37.1 C)] 98.8 F (37.1 C) (09/15 2040) Pulse Rate:  [57-75] 63 (09/15 2040) Resp:  [16-20] 20 (09/15 2040) BP: (88-123)/(38-61) 105/38 mmHg (09/15 2040) SpO2:  [95 %-100 %] 96 % (09/15 2040) Weight:  [138 lb 7.2 oz (62.8 kg)-142 lb 10.2 oz (64.7 kg)] 138 lb 7.2 oz (62.8 kg) (09/15 1845) Weight change: -2 lb 10.3 oz (-1.2 kg) Last BM Date: 12/06/13  Intake/Output from previous day: 09/15 0701 - 09/16 0700 In: 480 [P.O.:480] Out: 2003      Physical Exam: General- pt is awake,alert,follows comands Resp- No acute REsp distress, Decreased Bs at bases CVS- S1S2 regular in rate and rhythm GIT- BS+, soft, NT, ND EXT- NO LE Edema, Cyanosis Access Right AVF +  Lab Results: CBC  Recent Labs  12/07/13 0606  WBC 3.9*  HGB 8.9*  HCT 29.6*  PLT 167    BMET  Recent Labs  12/06/13 0657 12/07/13 0606  NA 140 140  K 4.0 4.2  CL 100 100  CO2 31 31  GLUCOSE 124* 178*  BUN 7 6  CREATININE 2.81* 2.54*  CALCIUM 8.6 8.2*     MICRO Recent Results (from the past 240 hour(s))  CULTURE, BLOOD (SINGLE)     Status: None   Collection Time    12/02/13  6:45 PM      Result Value Ref Range Status   Specimen Description BLOOD LEFT ANTECUBITAL COLLECTED BY DOCTOR MJ   Final   Special Requests BOTTLES DRAWN AEROBIC AND ANAEROBIC 12CC   Final   Culture  Setup Time     Final   Value: 12/03/2013 22:00     Performed at [facility]   Culture     Final   Value: STAPHYLOCOCCUS SPECIES (COAGULASE NEGATIVE)     Note: THE SIGNIFICANCE OF ISOLATING THIS ORGANISM FROM A SINGLE VENIPUNCTURE CANNOT BE PREDICTED WITHOUT FURTHER CLINICAL AND CULTURE CORRELATION. SUSCEPTIBILITIES AVAILABLE ONLY ON REQUEST.     Note: Gram Stain Report Called to,Read Back By and Verified With: J. LAVINDER AT 2012 ON 12/03/13 BY Ellwood Handler Performed at [facility]     Performed at [facility]   Report Status 12/05/2013 FINAL   Final  CULTURE, BLOOD (ROUTINE X 2)     Status: None   Collection Time    12/02/13  9:06 PM      Result Value Ref Range Status   Specimen  Description BLOOD LEFT HAND   Final   Special Requests     Final   Value: BOTTLES DRAWN AEROBIC AND ANAEROBIC AEB=5CC ANA=2CC   Culture NO GROWTH 5 DAYS   Final   Report Status 12/07/2013 FINAL   Final  CULTURE, BLOOD (ROUTINE X 2)     Status: None   Collection Time    12/02/13  9:06 PM      Result Value Ref Range Status   Specimen Description BLOOD LEFT HAND   Final   Special Requests BOTTLES DRAWN AEROBIC AND ANAEROBIC 2CC EACH   Final   Culture NO GROWTH 5 DAYS   Final   Report Status 12/07/2013 FINAL   Final  MRSA PCR SCREENING     Status: None   Collection Time    12/03/13  5:03 AM      Result Value Ref Range Status   MRSA by PCR NEGATIVE  NEGATIVE Final   Comment:            The GeneXpert MRSA Assay (FDA     approved for NASAL specimens     only), is one component of a     comprehensive MRSA colonization     surveillance program. It is not      intended to diagnose MRSA     infection nor to guide or     monitor treatment for     MRSA infections.      Lab Results  Component Value Date   PTH 76.1* 01/12/2007   CALCIUM 8.2* 12/07/2013   CAION 1.07* 08/19/2013   PHOS 2.9 12/06/2013        Impression: 1)Renal  ESRD on HD On TTS schedule as outpt On Daily Dialysis as inpt sec to volume related issues. Pt being  Dialyzed daily for past  4 days    2)HTN Medication- On Alpha and beta Blockers  3)Anemia HGb just below goal (9--11) On epo  4)CKD Mineral-Bone Disorder  Phosphorus on lower side.  not on any binders Calcium at goal.  5)Resp- admitted with resp failure. HCAP on Antibiotucs Pleural effusion  PMD following  6)Electrolytes Normokalemic NOrmonatremic   7)Acid base Co2 at goal    Plan:  Will continue current tx. If d/ced pt will follow up with outpt HD center.      BHUTANI,MANPREET S 12/08/2013, 5:19 AM

## 2013-12-08 NOTE — Progress Notes (Signed)
Physical Therapy Treatment Patient Details Name: [name] MRN: [id number] DOB: 10-22-1945 Today's Date: 12/08/2013    History of Present Illness HPI: [name] is a 68 y.o. 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 She presents to the emergency room with a chief complaint of shortness of breath. She's been feeling short-winded for the past one to 2 days and is unable to lay flat without getting dyspneic. She went to get her hemodialysis today, and she tells me that she has no fluid pulled all of and in fact she got 1 or 1.5 L. She doesn't her dry weight to 68.5 kg. She denies any fever or chills. She denies any cough or chest congestion. She has no chest pain. She denies any lightheadedness or dizziness. She has no abdominal pain, nausea vomiting or diarrhea. In the emergency room, she had a chest x-ray and a CT scan which showed possible healthcare associated pneumonia as well as large right-sided pleural effusion. In the emergency room she is breathing comfortable on 2 L nasal cannula (which he uses at home at nighttime only), satting in the mid 90s. Hospitalist asked for admission.    PT Comments    Pt seated at EOB when PT entered room after finishing breakfast.  Per pt/husband, she has been feeling much better and moving around easier.  Pt reports she was able to (I) transfer to EOB and has been going to restroom with assist of husband as needed today.  Pt was able to complete bed mobility skills (I) after PT requested to see mobility skills.  Pt demonstrated improved transfers with supervision and use of RW and gait with min guard/superivsion and use of RW; functional mobility was unable to be assessed during  evaluation secondary to fatigue/lethargy.  Pt attempted ambulation with use of std cane (as pt reports she does not use of AD at home for amb) without LOB, though pt reported unsteadiness.  Transferred pt to RW for gait and pt able to amb for 80 feet prior to seated rest break.  Recommended to pt to use RW at home initially for mobility skills for improved balance and gait.  Pt reported fatigue limiting gait, and requested O2 vitals checked.  O2 at 92% after gait of 80 feet, and quickly increased back to 95% after ~20 second of sitting at EOB without supplemental O2.  Pt reports she had open heart surgery in May 2015, and has been feeling deconditioned since that time.  Pt reports having HHPT services after surgery, though due to all the fluid she was retaining, pt/family report they did not feel this benefited pt.  Educated pt on HHPT vs. OOPT services, and family/pt requested OOPT services to improve strength and activity tolerance after discharge from hospital.  MMT assessed of LE with hip 4-/5, knee 4+/5, and ankle 4/5, indicating some strength deficits though primary deficiet is low activity tolerance/deconditioning.  Recommend continued PT while in the hospital to address functional mobility skills, with transition to [facility] services at discharge. Informed case manager of updated d/c recommendation, and updated goals as pt has met all initial PT goals.    Follow Up Recommendations  Outpatient PT ((cardiac rehab is able))      Equipment Recommendations  None recommended by PT  Mobility  Bed Mobility Overal bed mobility: Independent             General bed mobility comments: Pt sitting at EOB when PT entered room to eat breakfast.   Pt able to complete all bed mobility skills (I) once asked by PT to demonstrate skills.   Transfers Overall transfer level: Needs assistance Equipment used: Rolling walker (2 wheeled) Transfers: Sit to/from Stand Sit to Stand: Supervision             Ambulation/Gait Ambulation/Gait assistance: Supervision;Min guard Ambulation Distance (Feet): 80 Feet Assistive device: Rolling walker (2 wheeled) Gait Pattern/deviations: Step-through pattern;Decreased stride length   Gait velocity interpretation: Below normal speed for age/gender General Gait Details: Increased complaints of fatigue with gait, requesting O2 assessment (O2 at 92% after gait)        Cognition Arousal/Alertness: Awake/alert Behavior During Therapy: WFL for tasks assessed/performed Overall Cognitive Status: Within Functional Limits for tasks assessed                      Exercises General Exercises - Lower Extremity Ankle Circles/Pumps: 10 reps;AROM;Both;Supine Long Arc Quad: 10 reps;Strengthening;Both;Seated Heel Slides: 10 reps;AROM;Both;Supine Hip Flexion/Marching: 10 reps;Strengthening;Both;Seated        Pertinent Vitals/Pain Pain Assessment: No/denies pain           PT Goals (current goals can now be found in the care plan section) Progress towards PT goals: Goals met and updated - see care plan    Frequency  Min 3X/week    PT Plan Current plan remains appropriate;Discharge plan needs to be updated       End of Session Equipment Utilized During Treatment: Gait belt Activity Tolerance: Patient limited by fatigue Patient left: in bed;with call bell/phone within reach;with family/visitor present     Time: 1675-6125 PT Time Calculation (min): 31 min  Charges:  $Gait Training: 8-22 mins $Therapeutic Exercise: 8-22 mins                      [name] 12/08/2013, 10:07 AM

## 2013-12-08 NOTE — Progress Notes (Addendum)
Discharge information reviewed at bedside with husband present.  Reviewed new medications.  Patient and husband both verbalized understanding of the teaching.  Tele monitoring discontinued.  OP PT set up per CM.  Patient stable to DC home at this time, patient assisted downstairs to car via [place] with NT.   Kizzie Ide, RN

## 2013-12-09 NOTE — Telephone Encounter (Signed)
Dr Ardis Hughs this pt is scheduled to see you to discuss follow up EGD, she needs appt on a Mon, Wed, or Fri and you do not have any available at all.  Should she be following up with Dr Gala Romney or is the appointment with you correct?  Also, if she should see you can I put her on the extender schedule.  The pt was asking about seeing Dr Gala Romney instead of seeing an extender.  Please advise

## 2013-12-09 NOTE — Care Management Note (Signed)
    Page 1 of 1   12/09/2013     9:50:58 AM CARE MANAGEMENT NOTE 12/09/2013  Patient:  [facility]   Account Number:  [id number]  Date Initiated:  12/08/2013  Documentation initiated by:  Vladimir Creeks  Subjective/Objective Assessment:   Admitted with  PNA, CHF. Pt is from home with spouse, and will return home  at D/C.     Action/Plan:   PT recommending OP PT and pt would like to do this, and choses [facility] OP therapy which is set up for her and they will call her for an appt.   Anticipated DC Date:  12/08/2013   Anticipated DC Plan:  HOME/SELF CARE      DC Planning Services  CM consult      Choice offered to / List presented to:  C-1 Patient           Status of service:  Completed, signed off Medicare Important Message given?  YES (If response is "NO", the following Medicare IM given date fields will be blank) Date Medicare IM given:  12/08/2013 Medicare IM given by:  Vladimir Creeks Date Additional Medicare IM given:   Additional Medicare IM given by:    Discharge Disposition:  HOME/SELF CARE  Per UR Regulation:  Reviewed for med. necessity/level of care/duration of stay  If discussed at [place] of Stay Meetings, dates discussed:    Comments:  12/08/13 [place] RN/CM

## 2013-12-09 NOTE — Telephone Encounter (Signed)
The pt appt was rescheduled and was put on the wait list she is aware

## 2013-12-09 NOTE — Telephone Encounter (Signed)
It looks like she was put in for recall EGD by Dr. Oneida Alar to follow up duodenal ulcer noted 06/2013. She should contact their office about the recall.

## 2013-12-09 NOTE — Telephone Encounter (Signed)
Pt has been notified and appt with Dr Ardis Hughs has been cancelled

## 2013-12-22 ENCOUNTER — Ambulatory Visit
Admission: RE | Admit: 2013-12-22 | Discharge: 2013-12-22 | Disposition: A | Discharge status: Home / Self Care | Payer: Medicare Other | Source: Ambulatory Visit | Attending: Family Medicine | Admitting: Family Medicine

## 2013-12-22 ENCOUNTER — Other Ambulatory Visit: Payer: Self-pay | Admitting: Family Medicine

## 2013-12-22 DIAGNOSIS — J189 Pneumonia, unspecified organism: Secondary | ICD-10-CM

## 2013-12-28 ENCOUNTER — Encounter (HOSPITAL_COMMUNITY): Payer: Self-pay | Admitting: Emergency Medicine

## 2013-12-28 ENCOUNTER — Emergency Department (HOSPITAL_COMMUNITY)
Admission: EM | Admit: 2013-12-28 | Discharge: 2013-12-28 | Disposition: A | Discharge status: Home / Self Care | Payer: Medicare Other | Attending: Emergency Medicine | Admitting: Emergency Medicine

## 2013-12-28 ENCOUNTER — Emergency Department (HOSPITAL_COMMUNITY): Payer: Medicare Other

## 2013-12-28 DIAGNOSIS — E119 Type 2 diabetes mellitus without complications: Secondary | ICD-10-CM | POA: Diagnosis not present

## 2013-12-28 DIAGNOSIS — I2510 Atherosclerotic heart disease of native coronary artery without angina pectoris: Secondary | ICD-10-CM | POA: Diagnosis not present

## 2013-12-28 DIAGNOSIS — I120 Hypertensive chronic kidney disease with stage 5 chronic kidney disease or end stage renal disease: Secondary | ICD-10-CM | POA: Diagnosis not present

## 2013-12-28 DIAGNOSIS — Z794 Long term (current) use of insulin: Secondary | ICD-10-CM | POA: Diagnosis not present

## 2013-12-28 DIAGNOSIS — Z8543 Personal history of malignant neoplasm of ovary: Secondary | ICD-10-CM | POA: Insufficient documentation

## 2013-12-28 DIAGNOSIS — Z86010 Personal history of colonic polyps: Secondary | ICD-10-CM | POA: Insufficient documentation

## 2013-12-28 DIAGNOSIS — Y9389 Activity, other specified: Secondary | ICD-10-CM | POA: Diagnosis not present

## 2013-12-28 DIAGNOSIS — Z862 Personal history of diseases of the blood and blood-forming organs and certain disorders involving the immune mechanism: Secondary | ICD-10-CM | POA: Diagnosis not present

## 2013-12-28 DIAGNOSIS — I252 Old myocardial infarction: Secondary | ICD-10-CM | POA: Diagnosis not present

## 2013-12-28 DIAGNOSIS — Z79899 Other long term (current) drug therapy: Secondary | ICD-10-CM | POA: Insufficient documentation

## 2013-12-28 DIAGNOSIS — Z8673 Personal history of transient ischemic attack (TIA), and cerebral infarction without residual deficits: Secondary | ICD-10-CM | POA: Diagnosis not present

## 2013-12-28 DIAGNOSIS — Y9289 Other specified places as the place of occurrence of the external cause: Secondary | ICD-10-CM | POA: Diagnosis not present

## 2013-12-28 DIAGNOSIS — N186 End stage renal disease: Secondary | ICD-10-CM | POA: Diagnosis not present

## 2013-12-28 DIAGNOSIS — Z992 Dependence on renal dialysis: Secondary | ICD-10-CM | POA: Insufficient documentation

## 2013-12-28 DIAGNOSIS — W1809XA Striking against other object with subsequent fall, initial encounter: Secondary | ICD-10-CM | POA: Insufficient documentation

## 2013-12-28 DIAGNOSIS — S0101XA Laceration without foreign body of scalp, initial encounter: Secondary | ICD-10-CM | POA: Insufficient documentation

## 2013-12-28 DIAGNOSIS — Z87891 Personal history of nicotine dependence: Secondary | ICD-10-CM | POA: Insufficient documentation

## 2013-12-28 DIAGNOSIS — W19XXXA Unspecified fall, initial encounter: Secondary | ICD-10-CM

## 2013-12-28 NOTE — ED Notes (Signed)
Pts husband to desk requesting d/c papers so he can take pt to dialysis.  Requesting chest x-ray results be forwarded to PCP.  edp notified.

## 2013-12-28 NOTE — ED Notes (Signed)
Pt states she lost her balance at home and fell, hitting posterior head. A small laceration is present. Bleeding under control. Denies LOC.

## 2013-12-28 NOTE — ED Notes (Signed)
MD at bedside. 

## 2013-12-28 NOTE — ED Notes (Signed)
Pt fell today, hitting head on clock, denies LOC.  Pt has bruising to right flank from fall x 2 wks ago.  Pt reports increase in recent falls due to dizziness.

## 2013-12-28 NOTE — ED Provider Notes (Signed)
CSN: [id number]     Arrival date & time 12/28/13  1059 History  This chart was scribed for Nat Christen, MD, by Neta Ehlers, ED Scribe. This patient was seen in room APA03/[facility]03 and the patient's care was started at 11:42 AM.   None    Chief Complaint  Patient presents with  . Fall    The history is provided by the patient and the spouse. No language interpreter was used.   HPI Comments: [name] is a 68 y.o. female, with a h/o CAD and stroke, who presents to the Emergency Department complaining of a fall which occurred this morning when she became off-balanced while getting dressed. She states the fall was controlled, but she impacted her occiput against the edge of a grandfather clock. The pt denies LOC; her husband witnessed the fall and concurs no LOC. She  alsoreports a fall two weeks ago; she states increased falls due to dizziness. She denies taking blood thinners. The pt was diagnosed with pneumonia by her PCP, Dr. Kenton Kingfisher, approximately three days ago, she was admitted at [facility] for several days; she is currently taking antibiotics and reports improvement. The pt has a h/o ESRD; she is a Tuesday, Thursday, Saturday dialysis pt.   Past Medical History  Diagnosis Date  . Diabetes mellitus   . Hypertension   . Hyperlipidemia   . Coronary artery disease   . Carpal tunnel syndrome   . Anemia   . Colon polyps 01/2012    Per colonoscopy, Dr. Gala Romney  . Transfusion history 03-04-13    Transfusion x1 unit a month ago-[facility]  . Stroke 2010    2010-"brief weakness,tongue thickness,incoherent"-no residual affects.  . Mitral regurgitation   . Myocardial infarction 2009  . Peritonitis 06/23/2013  . Ovarian cancer 1995    [name]  . ESRD (end stage renal disease)     Started dialysis in 2009 with hemodialysis and did HD for about 5 years using a R arm AVF.  Changed to PD due to pt likes to travel and is independent.  Her nephrologist is Dr Hinda Lenis in  [place], [place].      Past Surgical History  Procedure Laterality Date  . Complete abdominal hysterectomy  1995  . Abdominal hysterectomy    . Colonoscopy with esophagogastroduodenoscopy (egd)  01/27/2012    Dr. Gala Romney. Small hiatal hernia, abnormal second portion of the duodenum with questionable extrinsic compression. Single left sided colonic diverticulum, 2 colon polyps. Hamartomatous colon polyp  . Eus  04/09/2012    Dr. Owens Loffler: Nonspecific edema/thickening of the periampullary duodenum. Unremarkable biopsy  . Tonsillectomy      child  . Peritoneal catheter insertion  10/30/2012    for peritoneal dialysis  . Back surgery  2009    x3 level fusion-Dr. Louanne Skye  . Cardiac catheterization      x2 stents-'09- Dr. Joellen Jersey  . Esophagogastroduodenoscopy (egd) with propofol N/A 03/11/2013    Dr. Owens Loffler, small duodenal diverticulum. Edematous duodenal mucosa with friability on biopsy (pathology negative)  . Three-vessel cabg, mitral valve annuloplasty, may ease  06/17/2013    [facility]  . Coronary artery bypass graft    . Esophagogastroduodenoscopy N/A 07/02/2013    [facility]:GI BLEED DUE TO LARGE DUODENAL ULCER/Small hiatal hernia/PYLORIC CHANNEL ULCER/Multiple CLEANED BASED ulcers in the duodenal bulb and 2nd part of the duodenum  . Givens capsule study N/A 07/02/2013    Procedure: GIVENS CAPSULE STUDY;  Surgeon: Danie Binder, MD;  Location[facility];  Service: Endoscopy;  Laterality: N/A;  possible deployment in EGD negative  . Radiology with anesthesia N/A 08/19/2013    Procedure: RADIOLOGY WITH ANESTHESIA;  Surgeon: Rob Hickman, MD;  Location: [place];  Service: Radiology;  Laterality: N/A;   Family History  Problem Relation Age of Onset  . Diabetes Mother    History  Substance Use Topics  . Smoking status: Former Smoker -- 0.50 packs/day for 15 years    Quit date: 03/25/1958  . Smokeless tobacco: Never Used  . Alcohol Use: No   No OB history  provided.  Review of Systems  A complete 10 system review of systems was obtained, and all systems were negative except where indicated in the HPI and PE.    Allergies  Lisinopril and Ezetimibe  Home Medications   Prior to Admission medications   Medication Sig Start Date End Date Taking? Authorizing Provider  ALPRAZolam Duanne Moron) 0.5 MG tablet Take 0.5 mg by mouth at bedtime as needed for anxiety.   Yes Historical Provider, MD  amoxicillin-clavulanate (AUGMENTIN) 875-125 MG per tablet Take 1 tablet by mouth 2 (two) times daily. Started on 12/22/2013   Yes Historical Provider, MD  carvedilol (COREG) 3.125 MG tablet Take 1.5625 mg by mouth 2 (two) times daily with a meal.    Yes Historical Provider, MD  insulin aspart protamine- aspart (NOVOLOG MIX 70/30) (70-30) 100 UNIT/ML injection Inject 10-20 Units into the skin 2 (two) times daily with a meal. Patient gets 20 units in the morning and 10 in the evening only when sugar is 140 or higher   Yes Historical Provider, MD  multivitamin (RENA-VIT) TABS tablet Take 1 tablet by mouth every morning.   Yes Historical Provider, MD  pantoprazole (PROTONIX) 40 MG tablet Take 1 tablet (40 mg total) by mouth 2 (two) times daily. 09/03/13  Yes Milus Banister, MD  EPINEPHrine 0.3 mg/0.3 mL IJ SOAJ injection Inject 0.3 mg into the muscle daily as needed (allergic reaction).    Historical Provider, MD  meclizine (ANTIVERT) 25 MG tablet Take 12.5 mg by mouth 2 (two) times daily as needed for dizziness (Vertigo).     Historical Provider, MD  ondansetron (ZOFRAN) 4 MG tablet Take 1 tablet (4 mg total) by mouth every 6 (six) hours as needed for nausea. 10/29/13   Sid Falcon, MD   Triage Vitals: BP 128/75  Pulse 81  Temp(Src) 97.7 F (36.5 C) (Oral)  Resp 20  Ht 5\' 5"  (1.651 m)  Wt 147 lb (66.679 kg)  BMI 24.46 kg/m2  SpO2 100%  Physical Exam  Nursing note and vitals reviewed. Constitutional: She is oriented to person, place, and time. She appears  well-developed and well-nourished. No distress.  HENT:  Head: Normocephalic and atraumatic.  Eyes: Conjunctivae and EOM are normal.  Neck: Neck supple. No tracheal deviation present.  Cardiovascular: Normal rate.   Pulmonary/Chest: Effort normal. No respiratory distress. She has wheezes.  Coughing during exam.   Musculoskeletal: Normal range of motion.  Neurological: She is alert and oriented to person, place, and time.  Skin: Skin is warm and dry.  Superficial laceration to occipital scalp, approximately 1 cm in diameter.   Psychiatric: She has a normal mood and affect. Her behavior is normal.    ED Course  Procedures (including critical care time)  DIAGNOSTIC STUDIES: Oxygen Saturation is 100% on [place], normal by my interpretation.    COORDINATION OF CARE:  11:57 AM- Discussed treatment plan with patient, and the patient agreed to  the plan. The pt requests a chest x-ray.   Labs Review Labs Reviewed - No data to display  Imaging Review Dg Chest 2 View  12/28/2013   CLINICAL DATA:  Fall today. Diabetes. Coronary artery disease. Cough and congestion x3 weeks.  EXAM: CHEST  2 VIEW  COMPARISON:  12/22/2013.  FINDINGS: Mediastinum and hilar structures normal. Cardiomegaly. Prior CABG and cardiac valve replacement. Pulmonary venous congestion. Bilateral interstitial prominence and pleural effusions noted previously consistent with congestive heart failure. No pneumothorax. No acute bony abnormality.  IMPRESSION: 1. Congestive heart failure with bilateral pulmonary interstitial edema and bilateral effusions. Superimposed pneumonia cannot be excluded. 2. Prior CABG and cardiac valve replacement .   Electronically Signed   By: Marcello Moores  Register   On: 12/28/2013 13:16     EKG Interpretation None      MDM   Final diagnoses:  Fall, initial encounter  Scalp laceration, initial encounter    Status post accidental mechanical fall. Patient was braced by her husband and hit the ground very  lightly. No loss of consciousness or neurological deficits. No evidence of a subdural hematoma. Occipital wound is minor. Patient requested chest x-ray prior to discharge, but did not want to stay for results. She has a known history of congestive heart failure and recent pneumonia. Pulse ox today 94% on 2-1/2 L which is her home oxygen rate I personally performed the services described in this documentation, which was scribed in my presence. The recorded information has been reviewed and is accurate.    Nat Christen, MD 12/28/13 1325

## 2013-12-28 NOTE — Discharge Instructions (Signed)
Simple dressing to scalp laceration.   Chest x-ray results are still pending

## 2014-01-03 ENCOUNTER — Ambulatory Visit (HOSPITAL_COMMUNITY)
Admission: RE | Admit: 2014-01-03 | Discharge: 2014-01-03 | Disposition: A | Discharge status: Home / Self Care | Payer: Medicare Other | Source: Ambulatory Visit | Attending: Family Medicine | Admitting: Family Medicine

## 2014-01-03 DIAGNOSIS — Z951 Presence of aortocoronary bypass graft: Secondary | ICD-10-CM | POA: Insufficient documentation

## 2014-01-03 DIAGNOSIS — R262 Difficulty in walking, not elsewhere classified: Secondary | ICD-10-CM | POA: Diagnosis not present

## 2014-01-03 DIAGNOSIS — R2689 Other abnormalities of gait and mobility: Secondary | ICD-10-CM

## 2014-01-03 DIAGNOSIS — R29898 Other symptoms and signs involving the musculoskeletal system: Secondary | ICD-10-CM

## 2014-01-03 DIAGNOSIS — Z5189 Encounter for other specified aftercare: Secondary | ICD-10-CM | POA: Diagnosis not present

## 2014-01-03 DIAGNOSIS — M6281 Muscle weakness (generalized): Secondary | ICD-10-CM | POA: Insufficient documentation

## 2014-01-03 NOTE — Evaluation (Addendum)
Physical Therapy Evaluation  Patient Details  Name: [name] MRN: [id number] Date of Birth: [birth year]  Today's Date: 01/03/2014 Time: 1400-1433 PT Time Calculation (min): 33 min Charge:  Evaluation              Visit#: 1 of 12  Re-eval: 02/02/14 Assessment Diagnosis: difficulty walking Prior Therapy: HH Precautions: falls, low 02 therefore Head must be elevated with exercises  Authorization: medicare    Past Medical History:  Past Medical History  Diagnosis Date  . Diabetes mellitus   . Hypertension   . Hyperlipidemia   . Coronary artery disease   . Carpal tunnel syndrome   . Anemia   . Colon polyps 01/2012    Per colonoscopy, Dr. Gala [name]  . Transfusion history 03-04-13    Transfusion x1 unit a month ago-[facility]  . Stroke 2010    2010-"brief weakness,tongue thickness,incoherent"-no residual affects.  . Mitral regurgitation   . Myocardial infarction 2009  . Peritonitis 06/23/2013  . Ovarian cancer 1995    De [name]-remission  . ESRD (end stage renal disease)     Started dialysis in 2009 with hemodialysis and did HD for about 5 years using a R arm AVF.  Changed to PD due to pt likes to travel and is independent.  Her nephrologist is Dr [name] in [place], [place].      Past Surgical History:  Past Surgical History  Procedure Laterality Date  . Complete abdominal hysterectomy  1995  . Abdominal hysterectomy    . Colonoscopy with esophagogastroduodenoscopy (egd)  01/27/2012    Dr. Gala [name]. Small hiatal hernia, abnormal second portion of the duodenum with questionable extrinsic compression. Single left sided colonic diverticulum, 2 colon polyps. Hamartomatous colon polyp  . Eus  04/09/2012    Dr. Owens [name]: Nonspecific edema/thickening of the periampullary duodenum. Unremarkable biopsy  . Tonsillectomy      child  . Peritoneal catheter insertion  10/30/2012    for peritoneal dialysis  . Back surgery  2009    x3 level fusion-Dr. Louanne [name]  . Cardiac  catheterization      x2 stents-'09- Dr. Joellen [name]  . Esophagogastroduodenoscopy (egd) with propofol N/A 03/11/2013    Dr. Owens [name], small duodenal diverticulum. Edematous duodenal mucosa with friability on biopsy (pathology negative)  . Three-vessel cabg, mitral valve annuloplasty, may ease  06/17/2013    [facility]  . Coronary artery bypass graft    . Esophagogastroduodenoscopy N/A 07/02/2013    SLF:GI BLEED DUE TO LARGE DUODENAL ULCER/Small hiatal hernia/PYLORIC CHANNEL ULCER/Multiple CLEANED BASED ulcers in the duodenal bulb and 2nd part of the duodenum  . Givens capsule study N/A 07/02/2013    Procedure: GIVENS CAPSULE STUDY;  Surgeon: [name] Binder, MD;  Location: [facility];  Service: Endoscopy;  Laterality: N/A;  possible deployment in EGD negative  . Radiology with anesthesia N/A 08/19/2013    Procedure: RADIOLOGY WITH ANESTHESIA;  Surgeon: [name] Hickman, MD;  Location: [facility];  Service: Radiology;  Laterality: N/A;    Subjective Symptoms/Limitations Symptoms: [name] is a 68 yo female who was dx with pnuemonia on9/09/2013 and admitted to the hospital.  She was discharged on 12/08/2013 to [facility] to try and improve her function but she states that they did not do anything for her.  .  She is now being referred to therapy to improve her functional status.   Since she has been home [name] states that she just gives out she states that she is out of breath.  She has been having to use a walker, (normally she uses no assistive device).     Pertinent History: DM, Renal failure,  Pt had triple bypass surgery in May and suffered a stroke.   How long can you sit comfortably?: no problem  How long can you stand comfortably?: tested by therapist 1 minute 20 seconds  How long can you walk comfortably?: She uses a walker and walks less than five minutes at a time.  Patient Stated Goals: I just want to get to normal again.   Pain Assessment Currently in Pain?:  No/denies  Precautions/Restrictions   falls   Due to SOB pt head must be elevated while exercising.  Balance Screening Balance Screen Has the patient fallen in the past 6 months: Yes How many times?: 3 Has the patient had a decrease in activity level because of a fear of falling? : Yes Is the patient reluctant to leave their home because of a fear of falling? : Yes  Prior Functioning   Pt I ambulating without an assistive device.  Driving active in community.l      Sensation/Coordination/Flexibility/Functional Tests Functional Tests Functional Tests: functional assessment questionare =2 Functional Tests: sit to stand 3 attempts with hands   Assessment RLE AROM (degrees) Right Ankle Dorsiflexion: -30 RLE Strength Right Hip Flexion: 3-/5 Right Hip Extension: 2+/5 Right Hip ABduction: 3-/5 Right Knee Flexion: 3/5 Right Knee Extension: 5/5 Right Ankle Dorsiflexion: 3-/5 LLE AROM (degrees) Left Ankle Dorsiflexion: -10 LLE Strength Left Hip Flexion: 3-/5 Left Hip Extension: 2+/5 Left Hip ABduction: 3-/5 Left Knee Flexion: 3/5 Left Knee Extension: 5/5 Left Ankle Dorsiflexion: 3+/5 (in available range)  Exercise/Treatments Mobility/Balance  Static Standing Balance Single Leg Stance - Right Leg: 0 Single Leg Stance - Left Leg: 0   Exercise: diaphragmic breathing Glut set Hip isometric adduction set     Physical Therapy Assessment and Plan PT Assessment and Plan Clinical Impression Statement: [name] is a 68 yo female who was ambulating without an assistive device and was active in the community until May of this year.  The patient had triple bypass surgery which was complicated by a stroke leaving her very weak and unable to walk without a walker.  The pt came down with pneumonia and was admitted into the hospital in September.   The patient went to SNF for a short peirod of time but did not feel they were hellping her, she stopped HH due to the fact that she did  not feel that they were helping.  She is now being referred to PT to improve her functioning status.  Evaluation shows a pt with accessory breathing, decreased balance, decreased strength and decreased ROM who will benefit from skilled PT to address these issues and maximize her functioning ability.   Pt will benefit from skilled therapeutic intervention in order to improve on the following deficits: Cardiopulmonary status limiting activity;Decreased activity tolerance;Decreased balance;Decreased mobility;Difficulty walking;Decreased strength;Decreased range of motion;Increased fascial restricitons Rehab Potential: Fair PT Frequency: Min 3X/week PT Duration: 8 weeks PT Treatment/Interventions: Gait training;Therapeutic activities;Therapeutic exercise;Balance training;Neuromuscular re-education;Patient/family education;Manual techniques PT Plan: Begin increasing ROM of ankles to allow normalized gait pattern, educate pt in diapharagmic breathing with activity to improve activity tolerance, Gt training with walker progressing to cane as able, strengthening and balance actiivity while mointoring 02 and HR.  HR should stay below 97 for the first severa weeks (65% of max)    Goals Home Exercise Program Pt/caregiver will Perform Home Exercise Program: For increased strengthening PT  Short Term Goals Time to Complete Short Term Goals: 2 weeks PT Short Term Goal 1: Pt to be able to verbalize the importance of diapharagmic breathing  PT Short Term Goal 2: Pt to be able to ambulate with walker for five minutes without stopping for improved safety  PT Short Term Goal 3: Pt to increase core strength to be able to stand for three minutes to be able to brush teeth at bathroom counter .  PT Short Term Goal 4: Pt to improve balance to  be able to SLS for five seconds to reduce risk of fall  PT Long Term Goals Time to Complete Long Term Goals: 4 weeks PT Long Term Goal 1: I in advance HEP PT Long Term Goal 2: PT  to be able to  Increase strength to  come from sit to stand on one attempt  Long Term Goal 3: Pt to be able to ambulate x 15 minutes with walker without stopping for better health habits  Long Term Goal 4: Pt to increase core strength to be able to stand for five minutes to be able to complete self grooming activity.   Problem List Patient Active Problem List   Diagnosis Date Noted  . Difficulty walking 01/03/2014  . Poor balance 01/03/2014  . Weakness of both legs 01/03/2014  . Acute respiratory failure with hypoxia 12/03/2013  . Pneumonia 12/02/2013  . HCAP (healthcare-associated pneumonia) 12/02/2013  . Acute on chronic diastolic heart failure 35/36/1443  . Hypokalemia 10/24/2013  . Diarrhea 10/24/2013  . Pulmonary edema, noncardiac 10/24/2013  . Appetite loss 10/24/2013  . Nausea with vomiting 10/24/2013  . Atrial fibrillation 08/23/2013  . 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 History of duodenal ulcer 07/16/2013  . Type II or unspecified type diabetes mellitus with renal manifestations, uncontrolled 06/29/2013  . History of angioedema 08/30/2012  . Anemia 01/24/2012  . HYPERLIPIDEMIA-MIXED 10/02/2008  . CAD, NATIVE VESSEL 10/02/2008  . Chronic diastolic heart failure 15/40/0867  . HYPERTENSION 09/17/2008  . ESRD on peritoneal dialysis 09/17/2008    PT - End of Session Activity Tolerance: Patient limited by fatigue PT Plan of Care PT Home Exercise Plan: given Will need a closed chained sheet once pt can tolerate.   GP Functional Assessment Tool Used: Functional assessment questionnaire Functional Limitation: Mobility: Walking and moving around Mobility: Walking and Moving Around Current Status [phone]): At least 40 percent but less than 60 percent impaired, limited or restricted Mobility: Walking and Moving Around Goal Status [phone]): At least 20 percent but less than 40 percent impaired, limited or restricted  Seamus Warehime,[name] 01/03/2014, 4:38  PM  Physician Documentation Your signature is required to indicate approval of the treatment plan as stated above.  Please sign and either send electronically or make a copy of this report for your files and return this physician signed original.   Please mark one 1.__approve of plan  2. ___approve of plan with the following conditions.   ______________________________                                                          _____________________ Physician Signature  Date  

## 2014-01-04 ENCOUNTER — Ambulatory Visit: Payer: Medicare Other | Admitting: Gastroenterology

## 2014-01-05 ENCOUNTER — Ambulatory Visit (HOSPITAL_COMMUNITY)
Admission: RE | Admit: 2014-01-05 | Discharge: 2014-01-05 | Disposition: A | Discharge status: Home / Self Care | Payer: Medicare Other | Source: Ambulatory Visit | Attending: Physical Therapy | Admitting: Physical Therapy

## 2014-01-05 DIAGNOSIS — Z5189 Encounter for other specified aftercare: Secondary | ICD-10-CM | POA: Diagnosis not present

## 2014-01-05 NOTE — Progress Notes (Signed)
Physical Therapy Treatment Patient Details  Name: [name] MRN: [id number] Date of Birth: 1945/05/11  Today's Date: 01/05/2014 Time: 1108-1150 PT Time Calculation (min): 42 min  Visit#: 2 of 12  Re-eval: 02/02/14 Authorization: medicare  Authorization Visit#: 2 of 10  Charges:  therex 40  Subjective: Symptoms/Limitations Symptoms: Pt states she has been practicing her diaphragmatic breathing. States she has been trying to increase her activity tolerance at home.  States she does not like to lay down. Pertinent History: DM, Renal failure,  Pt had triple bypass surgery in May and suffered a stroke.   Pain Assessment Currently in Pain?: No/denies   Exercise/Treatments Standing Knee Flexion: 5 reps Hip ADduction: 5 reps Gait Training:  30 feet X 2 with SPC Supine Bridges: 5 sets Prone  Other Prone Exercises: attempted prone exercises but unable to tolerate due to breathing     Physical Therapy Assessment and Plan PT Assessment and Plan Clinical Impression Statement: Oxygen and heart rate continuously monitored during session today.  O2 sats dropped quickly with minimal activity and patient unable to complete greater than 5 reps with each exercise before needing to rest.  O2 sats dropped to 83% with ambulation distance of 30 feet.   Heart rate remained 80-88 bpm during session. Attempted mat actvities for general strengthening, however patient unable to withstand being horizontal.  Discussed with evaulating therapist re: cardiac rehab vs PT and instructed to focus on patient's core strength.   Encouraged patient to continue increasing her activity at home and can ambulate with SPC indoors only at this point.  Pt verbalized understanding.   PT Plan: Continue to educate pt in diapharagmic breathing with activity to improve activity tolerance.  Progress Gt training using cane,  strengthening and balance actiivity while mointoring 02 and HR.  HR should stay below 97 for the first  severa weeks (65% of max).  Only complete standing exercises as patient cannot tolerate mat activities.     Problem List Patient Active Problem List   Diagnosis Date Noted  . Difficulty walking 01/03/2014  . Poor balance 01/03/2014  . Weakness of both legs 01/03/2014  . Acute respiratory failure with hypoxia 12/03/2013  . Pneumonia 12/02/2013  . HCAP (healthcare-associated pneumonia) 12/02/2013  . Acute on chronic diastolic heart failure 19/41/7408  . Hypokalemia 10/24/2013  . Diarrhea 10/24/2013  . Pulmonary edema, noncardiac 10/24/2013  . Appetite loss 10/24/2013  . Nausea with vomiting 10/24/2013  . Atrial fibrillation 08/23/2013  . 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 History of duodenal ulcer 07/16/2013  . Type II or unspecified type diabetes mellitus with renal manifestations, uncontrolled 06/29/2013  . History of angioedema 08/30/2012  . Anemia 01/24/2012  . HYPERLIPIDEMIA-MIXED 10/02/2008  . CAD, NATIVE VESSEL 10/02/2008  . Chronic diastolic heart failure 14/48/1856  . HYPERTENSION 09/17/2008  . ESRD on peritoneal dialysis 09/17/2008    PT - End of Session Activity Tolerance: Patient limited by fatigue   Teena Irani, PTA/CLT 01/05/2014, 12:23 PM

## 2014-01-07 ENCOUNTER — Ambulatory Visit (HOSPITAL_COMMUNITY)
Admission: RE | Admit: 2014-01-07 | Discharge: 2014-01-07 | Disposition: A | Discharge status: Home / Self Care | Payer: Medicare Other | Source: Ambulatory Visit | Attending: Physical Therapy | Admitting: Physical Therapy

## 2014-01-07 VITALS — BP 118/56 | HR 86

## 2014-01-07 DIAGNOSIS — R2689 Other abnormalities of gait and mobility: Secondary | ICD-10-CM

## 2014-01-07 DIAGNOSIS — Z5189 Encounter for other specified aftercare: Secondary | ICD-10-CM | POA: Diagnosis not present

## 2014-01-07 DIAGNOSIS — R262 Difficulty in walking, not elsewhere classified: Secondary | ICD-10-CM

## 2014-01-07 DIAGNOSIS — R29898 Other symptoms and signs involving the musculoskeletal system: Secondary | ICD-10-CM

## 2014-01-07 NOTE — Progress Notes (Signed)
Physical Therapy Treatment Patient Details  Name: [name] MRN: [id number] Date of Birth: Oct 13, 1945  Today's Date: 01/07/2014 Time: 1523-1606 PT Time Calculation (min): 43 min Charge: TE [id number]  Visit#: 3 of 12  Re-eval: 02/02/14 Assessment Diagnosis: difficulty walking Prior Therapy: falls  Authorization: medicare  Authorization Time Period:    Authorization Visit#: 3 of 10   Subjective: Symptoms/Limitations Symptoms: Pt stated she is tired today, entered dept with no AD.  Stated she has been practicing her diaphragmatic breathing.  Has not seen her heart surgon/MD since heart surgery in March. Pain Assessment Currently in Pain?: No/denies  Objective:   Exercise/Treatments Stretches Gastroc Stretch: 2 reps;3 reps;30 seconds;Limitations Gastroc Stretch Limitations: 2 sets slant board; 2 sets against wall HEP Standing Heel Raises: 2 sets;10 reps Knee Flexion: 5 reps Seated Other Seated Knee Exercises: scapular retraction 10x5"; ab sets Other Seated Knee Exercises: Diagraphmatic breathing multiple sets through session to increase O2 sat      Physical Therapy Assessment and Plan PT Assessment and Plan Clinical Impression Statement: Oxygen and heart rate continuously monitored during session today.  Primary focus today on instructing diagraphatic breathing to bring O2 sats.  Pt required frequent rest breaks due to fatigue.  Secondary focus on improving core strengthening and improving ankle mobility per evaluation PT.  Multimodal cueing required for ab sets to continue breathing during abdominal contraction to keep O2 sats within appropraite range and tactile cueing for appropriate muscle contraction.  Pt instructed and given HEP for gastroc stretch against wall.  Pt encouraged to continue ambulating with RW (arrived today with no AD) to reduce risk of falls. PT Plan: Continue to educate pt in diapharagmic breathing with activity to improve activity tolerance.   Progress Gt training using cane,  strengthening and balance actiivity while mointoring 02 and HR.  HR should stay below 97 for the first severa weeks (65% of max).  Only complete standing exercises as patient cannot tolerate mat activities.    Goals PT Short Term Goals PT Short Term Goal 1: Pt to be able to verbalize the importance of diapharagmic breathing  PT Short Term Goal 1 - Progress: Progressing toward goal PT Short Term Goal 2: Pt to be able to ambulate with walker for five minutes without stopping for improved safety  PT Short Term Goal 3: Pt to be able to stand for three minutes to be able to brush teeth at bathroom counter .  PT Short Term Goal 3 - Progress: Progressing toward goal PT Short Term Goal 4: Pt to be able to SLS for five seconds to reduce risk of fall  PT Long Term Goals PT Long Term Goal 1: I in advance HEP PT Long Term Goal 2: PT to be able to come from sit to stand on one attempt  Long Term Goal 3: Pt to be able to ambulate x 15 minutes with walker without stopping for better health habits  Long Term Goal 4: Pt to be able to stand for five minutes to be able to complete self grooming activity.   Problem List Patient Active Problem List   Diagnosis Date Noted  . Difficulty walking 01/03/2014  . Poor balance 01/03/2014  . Weakness of both legs 01/03/2014  . Acute respiratory failure with hypoxia 12/03/2013  . Pneumonia 12/02/2013  . HCAP (healthcare-associated pneumonia) 12/02/2013  . Acute on chronic diastolic heart failure 31/59/4585  . Hypokalemia 10/24/2013  . Diarrhea 10/24/2013  . Pulmonary edema, noncardiac 10/24/2013  . Appetite loss 10/24/2013  .  Nausea with vomiting 10/24/2013  . Atrial fibrillation 08/23/2013  . 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 History of duodenal ulcer 07/16/2013  . Type II or unspecified type diabetes mellitus with renal manifestations, uncontrolled 06/29/2013  . History of angioedema 08/30/2012  .  Anemia 01/24/2012  . HYPERLIPIDEMIA-MIXED 10/02/2008  . CAD, NATIVE VESSEL 10/02/2008  . Chronic diastolic heart failure 70/78/6754  . HYPERTENSION 09/17/2008  . ESRD on peritoneal dialysis 09/17/2008    PT - End of Session Equipment Utilized During Treatment: Gait belt Activity Tolerance: Patient limited by fatigue General Behavior During Therapy: [facility] for tasks assessed/performed  GP    Aldona Lento 01/07/2014, 6:55 PM

## 2014-01-10 ENCOUNTER — Ambulatory Visit (HOSPITAL_COMMUNITY): Payer: Medicare Other | Admitting: Physical Therapy

## 2014-01-12 ENCOUNTER — Ambulatory Visit: Payer: Self-pay | Admitting: Vascular Surgery

## 2014-01-12 ENCOUNTER — Ambulatory Visit (HOSPITAL_COMMUNITY)
Admission: RE | Admit: 2014-01-12 | Discharge: 2014-01-12 | Disposition: A | Discharge status: Home / Self Care | Payer: Medicare Other | Source: Ambulatory Visit | Attending: Family Medicine | Admitting: Family Medicine

## 2014-01-12 DIAGNOSIS — Z5189 Encounter for other specified aftercare: Secondary | ICD-10-CM | POA: Diagnosis not present

## 2014-01-12 DIAGNOSIS — R262 Difficulty in walking, not elsewhere classified: Secondary | ICD-10-CM

## 2014-01-12 DIAGNOSIS — R2689 Other abnormalities of gait and mobility: Secondary | ICD-10-CM

## 2014-01-12 DIAGNOSIS — R29898 Other symptoms and signs involving the musculoskeletal system: Secondary | ICD-10-CM

## 2014-01-12 LAB — CBC
HCT: 30.6 % — AB (ref 35.0–47.0)
HGB: 9.5 g/dL — ABNORMAL LOW (ref 12.0–16.0)
MCH: 33 pg (ref 26.0–34.0)
MCHC: 31.2 g/dL — ABNORMAL LOW (ref 32.0–36.0)
MCV: 106 fL — ABNORMAL HIGH (ref 80–100)
Platelet: 239 10*3/uL (ref 150–440)
RBC: 2.89 10*6/uL — ABNORMAL LOW (ref 3.80–5.20)
RDW: 18.5 % — AB (ref 11.5–14.5)
WBC: 3.5 10*3/uL — AB (ref 3.6–11.0)

## 2014-01-12 LAB — BASIC METABOLIC PANEL
ANION GAP: 6 — AB (ref 7–16)
BUN: 14 mg/dL (ref 7–18)
CALCIUM: 8.5 mg/dL (ref 8.5–10.1)
CHLORIDE: 107 mmol/L (ref 98–107)
Co2: 33 mmol/L — ABNORMAL HIGH (ref 21–32)
Creatinine: 3.96 mg/dL — ABNORMAL HIGH (ref 0.60–1.30)
GFR CALC AF AMER: 14 — AB
GFR CALC NON AF AMER: 12 — AB
Glucose: 151 mg/dL — ABNORMAL HIGH (ref 65–99)
Osmolality: 294 (ref 275–301)
Potassium: 3.8 mmol/L (ref 3.5–5.1)
Sodium: 146 mmol/L — ABNORMAL HIGH (ref 136–145)

## 2014-01-12 NOTE — Progress Notes (Signed)
Physical Therapy Treatment Patient Details  Name: [name] MRN: [id number] Date of Birth: May 31, 1945  Today's Date: 01/12/2014 Time: 1100-1145 PT Time Calculation (min): 45 min Charge: TE 1100-1145  Visit#: 4 of 12  Re-eval: 02/02/14    Authorization: medicare  Authorization Time Period:    Authorization Visit#: 4 of 10   Subjective: Symptoms/Limitations Symptoms: Pt states she is doing better she is walking 2-3 times at home for 5-10 minutes   Objective:  Exercise/Treatments Stretches Gastroc Stretch: 3 reps;30 seconds;Limitations Gastroc Stretch Limitations: 2 sets slant board; 2 sets against wall HEP Standing Other Standing Knee Exercises: 3 STS each foot behind from mat 20 1/2in Other Standing Knee Exercises: shoulder extension red tband 10x Seated Other Seated Knee Exercises: abduction with manual resistance, addusciton with pillow, seated rows with red tband, absets with cueing for diagraphagmatic breathing Other Seated Knee Exercises: Diagraphmatic breathing multiple sets through session to increase O2 sat      Physical Therapy Assessment and Plan PT Assessment and Plan Clinical Impression Statement: Azucena Freed, PT began session 11:00-11:20.  Oxygen and heart rate continuously monitored during session today  Pt continues to required cueing to improve diapgraphic breathing to bring O2 sats up,, improvoing with O2 sats range form 87-99% today.  Pt limited by fatigue thruogh session, requiring rest break between each activity.  Progressed postural strengthening with red tband rows and shoulder extension with proper form following cueing.  Reviewed proper technique with gastroc stretches, encouraged pt to be more compliant with HEP.  No reports of pain through sessoin.  PT Plan: Continue to educate pt in diapharagmic breathing with activity to improve activity tolerance.  Progress Gt training using cane,  strengthening and balance actiivity while mointoring 02 and  HR.  HR should stay below 97 for the first severa weeks (65% of max).  Only complete standing exercises as patient cannot tolerate mat activities.    Goals PT Short Term Goals PT Short Term Goal 1: Pt to be able to verbalize the importance of diapharagmic breathing  PT Short Term Goal 1 - Progress: Progressing toward goal PT Short Term Goal 2: Pt to be able to ambulate with walker for five minutes without stopping for improved safety  PT Short Term Goal 3: Pt to be able to stand for three minutes to be able to brush teeth at bathroom counter .  PT Short Term Goal 3 - Progress: Progressing toward goal PT Short Term Goal 4: Pt to be able to SLS for five seconds to reduce risk of fall  PT Long Term Goals PT Long Term Goal 1: I in advance HEP PT Long Term Goal 2: PT to be able to come from sit to stand on one attempt  PT Long Term Goal 2 - Progress: Progressing toward goal Long Term Goal 3: Pt to be able to ambulate x 15 minutes with walker without stopping for better health habits  Long Term Goal 4: Pt to be able to stand for five minutes to be able to complete self grooming activity.   Problem List Patient Active Problem List   Diagnosis Date Noted  . Difficulty walking 01/03/2014  . Poor balance 01/03/2014  . Weakness of both legs 01/03/2014  . Acute respiratory failure with hypoxia 12/03/2013  . Pneumonia 12/02/2013  . HCAP (healthcare-associated pneumonia) 12/02/2013  . Acute on chronic diastolic heart failure 61/95/0932  . Hypokalemia 10/24/2013  . Diarrhea 10/24/2013  . Pulmonary edema, noncardiac 10/24/2013  . Appetite loss 10/24/2013  .  Nausea with vomiting 10/24/2013  . Atrial fibrillation 08/23/2013  . 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 History of duodenal ulcer 07/16/2013  . Type II or unspecified type diabetes mellitus with renal manifestations, uncontrolled 06/29/2013  . History of angioedema 08/30/2012  . Anemia 01/24/2012  .  HYPERLIPIDEMIA-MIXED 10/02/2008  . CAD, NATIVE VESSEL 10/02/2008  . Chronic diastolic heart failure 76/80/8811  . HYPERTENSION 09/17/2008  . ESRD on peritoneal dialysis 09/17/2008    PT - End of Session Equipment Utilized During Treatment: Gait belt Activity Tolerance: Patient limited by fatigue General Behavior During Therapy: [facility] for tasks assessed/performed  GP    Aldona Lento 01/12/2014, 11:57 AM

## 2014-01-14 ENCOUNTER — Ambulatory Visit (HOSPITAL_COMMUNITY)
Admission: RE | Admit: 2014-01-14 | Payer: Medicare Other | Source: Ambulatory Visit | Attending: Family Medicine | Admitting: Family Medicine

## 2014-01-19 ENCOUNTER — Ambulatory Visit (HOSPITAL_COMMUNITY)
Admission: RE | Admit: 2014-01-19 | Discharge: 2014-01-19 | Disposition: A | Discharge status: Home / Self Care | Payer: Medicare Other | Source: Ambulatory Visit | Attending: Family Medicine | Admitting: Family Medicine

## 2014-01-19 DIAGNOSIS — Z5189 Encounter for other specified aftercare: Secondary | ICD-10-CM | POA: Diagnosis not present

## 2014-01-19 NOTE — Progress Notes (Signed)
Physical Therapy Treatment Patient Details  Name: [name] MRN: [id number] Date of Birth: 1945-11-10  Today's Date: 01/19/2014 Time: 4097-3532 PT Time Calculation (min): 44 min 8' NMR 36' TE  Visit#: 5 of 12  Re-eval: 02/02/14    Authorization: medicare  Authorization Time Period:    Authorization Visit#: 5 of 10   Subjective:  I don't feel very good today, didn't want to come. No c/o pain     Exercise/Treatments       Standing Exercises Neck Retraction: 10 reps;5 secs;Limitations (with towel)   Stretches Gastroc Stretch: 3 reps;30 seconds;Limitations   Standing Heel Raises: Limitations;10 reps (seated TR with 2# weight over toes) Rocker Board: 4 minutes;Limitations (2' each F/B L/R (assist with compensation of hips for DF)) Other Standing Knee Exercises: 3 STS each foot behind from mat 20 1/2in Other Standing Knee Exercises: shoulder extension red tband 10x Seated Other Seated Knee Exercises: abduction with manual resistance, addusciton with pillow, seated rows with red tband, absets with cueing for diagraphagmatic breathing Other Seated Knee Exercises: Diagraphmatic breathing multiple sets through session to increase O2 sat S    Balance Exercises Standing Other Standing Exercises: 62mins hip and shoulder peturbations;Min A; weight static weight shifting activities with verbal cues for weight shifts to maintain bal        Physical Therapy Assessment and Plan PT Assessment and Plan Clinical Impression Statement: Continual monitoring of O2 sats during session which ranged from 79-94%; corrected easily with diaphramic breathing cues throughout visit. Added rocker board for ankle mobiliy ina ll dierctions, assisted pt with full DF ROM by stepping on board to stretch gastroc. Patient performed standing shoulder and hip perturbations to improve ankle balance stratigies, verbal cueing for fwd weight shifting required to maintain balance at times.Patient also required  several seated rest breaks throughout session due to fatigue. Progressed HEP to include seated neck retraction isometrics. PT Plan: Continue with PT POC. Continue to educate pt in diapharagmic breathing with activity to improve activity tolerance.  Progress Gt training using cane,  strengthening and balance actiivity while mointoring 02 and HR.  HR should stay below 97 for the first severa weeks (65% of max).  Only complete standing exercises as patient cannot tolerate mat activities.    Goals    Problem List Patient Active Problem List   Diagnosis Date Noted  . Difficulty walking 01/03/2014  . Poor balance 01/03/2014  . Weakness of both legs 01/03/2014  . Acute respiratory failure with hypoxia 12/03/2013  . Pneumonia 12/02/2013  . HCAP (healthcare-associated pneumonia) 12/02/2013  . Acute on chronic diastolic heart failure 99/24/2683  . Hypokalemia 10/24/2013  . Diarrhea 10/24/2013  . Pulmonary edema, noncardiac 10/24/2013  . Appetite loss 10/24/2013  . Nausea with vomiting 10/24/2013  . Atrial fibrillation 08/23/2013  . 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 History of duodenal ulcer 07/16/2013  . Type II or unspecified type diabetes mellitus with renal manifestations, uncontrolled 06/29/2013  . History of angioedema 08/30/2012  . Anemia 01/24/2012  . HYPERLIPIDEMIA-MIXED 10/02/2008  . CAD, NATIVE VESSEL 10/02/2008  . Chronic diastolic heart failure 41/96/2229  . HYPERTENSION 09/17/2008  . ESRD on peritoneal dialysis 09/17/2008       GP    Ronnell Makarewicz, [place] 01/19/2014, 12:14 PM

## 2014-01-20 ENCOUNTER — Emergency Department (HOSPITAL_COMMUNITY)
Admission: EM | Admit: 2014-01-20 | Discharge: 2014-01-20 | Disposition: A | Discharge status: Home / Self Care | Payer: Medicare Other | Attending: Emergency Medicine | Admitting: Emergency Medicine

## 2014-01-20 ENCOUNTER — Telehealth: Payer: Self-pay | Admitting: Cardiovascular Disease

## 2014-01-20 ENCOUNTER — Encounter (HOSPITAL_COMMUNITY): Payer: Self-pay | Admitting: Emergency Medicine

## 2014-01-20 DIAGNOSIS — Z794 Long term (current) use of insulin: Secondary | ICD-10-CM | POA: Diagnosis not present

## 2014-01-20 DIAGNOSIS — S0181XA Laceration without foreign body of other part of head, initial encounter: Secondary | ICD-10-CM

## 2014-01-20 DIAGNOSIS — Z8543 Personal history of malignant neoplasm of ovary: Secondary | ICD-10-CM | POA: Insufficient documentation

## 2014-01-20 DIAGNOSIS — N186 End stage renal disease: Secondary | ICD-10-CM | POA: Diagnosis not present

## 2014-01-20 DIAGNOSIS — Z951 Presence of aortocoronary bypass graft: Secondary | ICD-10-CM | POA: Diagnosis not present

## 2014-01-20 DIAGNOSIS — Z8673 Personal history of transient ischemic attack (TIA), and cerebral infarction without residual deficits: Secondary | ICD-10-CM | POA: Diagnosis not present

## 2014-01-20 DIAGNOSIS — W1849XA Other slipping, tripping and stumbling without falling, initial encounter: Secondary | ICD-10-CM | POA: Insufficient documentation

## 2014-01-20 DIAGNOSIS — I252 Old myocardial infarction: Secondary | ICD-10-CM | POA: Insufficient documentation

## 2014-01-20 DIAGNOSIS — Y929 Unspecified place or not applicable: Secondary | ICD-10-CM | POA: Insufficient documentation

## 2014-01-20 DIAGNOSIS — Y9389 Activity, other specified: Secondary | ICD-10-CM | POA: Diagnosis not present

## 2014-01-20 DIAGNOSIS — Z87891 Personal history of nicotine dependence: Secondary | ICD-10-CM | POA: Insufficient documentation

## 2014-01-20 DIAGNOSIS — Z862 Personal history of diseases of the blood and blood-forming organs and certain disorders involving the immune mechanism: Secondary | ICD-10-CM | POA: Insufficient documentation

## 2014-01-20 DIAGNOSIS — Z86010 Personal history of colonic polyps: Secondary | ICD-10-CM | POA: Diagnosis not present

## 2014-01-20 DIAGNOSIS — E119 Type 2 diabetes mellitus without complications: Secondary | ICD-10-CM | POA: Insufficient documentation

## 2014-01-20 DIAGNOSIS — W19XXXA Unspecified fall, initial encounter: Secondary | ICD-10-CM

## 2014-01-20 DIAGNOSIS — Z79899 Other long term (current) drug therapy: Secondary | ICD-10-CM | POA: Diagnosis not present

## 2014-01-20 DIAGNOSIS — I120 Hypertensive chronic kidney disease with stage 5 chronic kidney disease or end stage renal disease: Secondary | ICD-10-CM | POA: Diagnosis not present

## 2014-01-20 DIAGNOSIS — I2510 Atherosclerotic heart disease of native coronary artery without angina pectoris: Secondary | ICD-10-CM | POA: Insufficient documentation

## 2014-01-20 DIAGNOSIS — Z9889 Other specified postprocedural states: Secondary | ICD-10-CM | POA: Diagnosis not present

## 2014-01-20 DIAGNOSIS — S01112A Laceration without foreign body of left eyelid and periocular area, initial encounter: Secondary | ICD-10-CM | POA: Diagnosis not present

## 2014-01-20 NOTE — Telephone Encounter (Signed)
New message    Surgery on 10/30. Waiting  on cardiac clearance.

## 2014-01-20 NOTE — ED Provider Notes (Signed)
CSN: [id number]     Arrival date & time 01/20/14  1024 History  This chart was scribed for Nat Christen, MD by Edison Simon, ED Scribe. This patient was seen in room APA08/APA08 and the patient's care was started at 10:41 AM.    Chief Complaint  Patient presents with  . Fall   The history is provided by the patient and the spouse. No language interpreter was used.    HPI Comments: [name] is a 68 y.o. female who presents to the Emergency Department complaining of laceration above her left eye status post falling after losing her balance while getting dressed just PTA. She denies losing consciousness. She denies confusion.  Past Medical History  Diagnosis Date  . Diabetes mellitus   . Hypertension   . Hyperlipidemia   . Coronary artery disease   . Carpal tunnel syndrome   . Anemia   . Colon polyps 01/2012    Per colonoscopy, Dr. Gala Romney  . Transfusion history 03-04-13    Transfusion x1 unit a month ago-[facility]  . Stroke 2010    2010-"brief weakness,tongue thickness,incoherent"-no residual affects.  . Mitral regurgitation   . Myocardial infarction 2009  . Peritonitis 06/23/2013  . Ovarian cancer 1995    [name]  . ESRD (end stage renal disease)     Started dialysis in 2009 with hemodialysis and did HD for about 5 years using a R arm AVF.  Changed to PD due to pt likes to travel and is independent.  Her nephrologist is Dr Hinda Lenis in [place], [place].      Past Surgical History  Procedure Laterality Date  . Complete abdominal hysterectomy  1995  . Abdominal hysterectomy    . Colonoscopy with esophagogastroduodenoscopy (egd)  01/27/2012    Dr. Gala Romney. Small hiatal hernia, abnormal second portion of the duodenum with questionable extrinsic compression. Single left sided colonic diverticulum, 2 colon polyps. Hamartomatous colon polyp  . Eus  04/09/2012    Dr. Owens Loffler: Nonspecific edema/thickening of the periampullary duodenum. Unremarkable biopsy  .  Tonsillectomy      child  . Peritoneal catheter insertion  10/30/2012    for peritoneal dialysis  . Back surgery  2009    x3 level fusion-Dr. Louanne Skye  . Cardiac catheterization      x2 stents-'09- Dr. Joellen Jersey  . Esophagogastroduodenoscopy (egd) with propofol N/A 03/11/2013    Dr. Owens Loffler, small duodenal diverticulum. Edematous duodenal mucosa with friability on biopsy (pathology negative)  . Three-vessel cabg, mitral valve annuloplasty, may ease  06/17/2013    [facility]  . Coronary artery bypass graft    . Esophagogastroduodenoscopy N/A 07/02/2013    SLF:GI BLEED DUE TO LARGE DUODENAL ULCER/Small hiatal hernia/PYLORIC CHANNEL ULCER/Multiple CLEANED BASED ulcers in the duodenal bulb and 2nd part of the duodenum  . Givens capsule study N/A 07/02/2013    Procedure: GIVENS CAPSULE STUDY;  Surgeon: Danie Binder, MD;  [facility];  Service: Endoscopy;  Laterality: N/A;  possible deployment in EGD negative  . Radiology with anesthesia N/A 08/19/2013    Procedure: RADIOLOGY WITH ANESTHESIA;  Surgeon: Rob Hickman, MD;  Location: [facility];  Service: Radiology;  Laterality: N/A;   Family History  Problem Relation Age of Onset  . Diabetes Mother    History  Substance Use Topics  . Smoking status: Former Smoker -- 0.50 packs/day for 15 years    Quit date: 03/25/1958  . Smokeless tobacco: Never Used  . Alcohol Use: No   OB  History   Grav Para Term Preterm Abortions TAB SAB Ect Mult Living                 Review of Systems A complete 10 system review of systems was obtained and all systems are negative except as noted in the HPI and PMH.    Allergies  Lisinopril and Ezetimibe  Home Medications   Prior to Admission medications   Medication Sig Start Date End Date Taking? Authorizing Provider  ALPRAZolam Duanne Moron) 0.5 MG tablet Take 0.5 mg by mouth at bedtime as needed for anxiety.   Yes Historical Provider, MD  calcitRIOL (ROCALTROL) 0.5 MCG capsule Take 0.5  mcg by mouth daily.   Yes Historical Provider, MD  carvedilol (COREG) 3.125 MG tablet Take 1.5625 mg by mouth 2 (two) times daily with a meal.    Yes Historical Provider, MD  insulin NPH-regular Human (HUMULIN 70/30) (70-30) 100 UNIT/ML injection Inject 10-20 Units into the skin 2 (two) times daily with a meal.   Yes Historical Provider, MD  meclizine (ANTIVERT) 25 MG tablet Take 12.5 mg by mouth 2 (two) times daily as needed for dizziness (Vertigo).    Yes Historical Provider, MD  multivitamin (RENA-VIT) TABS tablet Take 1 tablet by mouth every morning.   Yes Historical Provider, MD  pantoprazole (PROTONIX) 40 MG tablet Take 1 tablet (40 mg total) by mouth 2 (two) times daily. 09/03/13  Yes Milus Banister, MD  EPINEPHrine 0.3 mg/0.3 mL IJ SOAJ injection Inject 0.3 mg into the muscle daily as needed (allergic reaction).    Historical Provider, MD   BP 131/68  Pulse 80  Temp(Src) 97.9 F (36.6 C)  Resp 18  Ht 5\' 5"  (1.651 m)  Wt 140 lb (63.504 kg)  BMI 23.30 kg/m2  SpO2 99% Physical Exam  Nursing note and vitals reviewed. Constitutional: She is oriented to person, place, and time. She appears well-developed and well-nourished.  HENT:  Head: Normocephalic and atraumatic.  Eyes: Conjunctivae and EOM are normal. Pupils are equal, round, and reactive to light.  Neck: Normal range of motion. Neck supple.  Cardiovascular: Normal rate, regular rhythm and normal heart sounds.   Pulmonary/Chest: Effort normal and breath sounds normal.  Abdominal: Soft. Bowel sounds are normal.  Musculoskeletal: Normal range of motion.  Neurological: She is alert and oriented to person, place, and time.  Skin: Skin is warm and dry.  2cm oblique laceration over her left eyebrow  Psychiatric: She has a normal mood and affect. Her behavior is normal.    ED Course  LACERATION REPAIR Date/Time: 01/21/2014 7:27 AM Performed by: Nat Christen Authorized by: Nat Christen Comments: 11:15: 2 cm oblique laceration on  left eyebrow. Wound cleaned with normal saline.  Wound explored. No foreign body. 1/8 inch Steri-Strips applied. Patient tolerated procedure well   (including critical care time)  DIAGNOSTIC STUDIES: Oxygen Saturation is 96% on room air, normal by my interpretation.    COORDINATION OF CARE: 10:45 AM The patient states she does not want to get sutures, so I discussed with the patient my plan to apply Steri-Strips to her wound, which I believe will allow for proper healing. The patient agrees with the plan and has no further questions at this time.  Labs Review Labs Reviewed - No data to display  Imaging Review No results found.   EKG Interpretation None      MDM   Final diagnoses:  Fall, initial encounter  Laceration of face, initial encounter    Status  post fall with 2 cm laceration of left eyebrow. No neurological deficits. Wound cleaned, examined, and repaired with Steri-Strips. No imaging necessary  I personally performed the services described in this documentation, which was scribed in my presence. The recorded information has been reviewed and is accurate.    Nat Christen, MD 01/21/14 [phone]

## 2014-01-20 NOTE — ED Notes (Signed)
Pt tripped and fell on left side while getting dressed this am. Denies loc. Laceration above left eye.

## 2014-01-20 NOTE — Discharge Instructions (Signed)
Keep Steri-Strips on until they fall off.

## 2014-01-20 NOTE — Telephone Encounter (Signed)
Patient has a pedi catheter for peritoneal dialysis that needs to be changed. [facility] [place], and they are telling her she needs cardiac clearance before they will do anything. Since it is after 5 pm today, will call them to verify and make an appointment for patient here.

## 2014-01-20 NOTE — Telephone Encounter (Signed)
[name] is aware of pt's status; a copy of the last EKG done at [facility] on September 15 th 2015. [name] is aware.

## 2014-01-20 NOTE — Telephone Encounter (Signed)
[name] from [facility] called for surgical clearance for pt. [name] said that she had faxed a clearance request and an EKG for Dr. Angelena Form to read and compare with  the last EKG done here. According to our records pt has not been see in this office by Dr. Angelena Form since April 2013. Pt had an appointment here with Truitt Merle for May 13 th 2015 for post CABG's and Mitral Valve repair done at [facility]; there are no records that the patient came for that appointment. The last EKG that we have in our records was done in september 15 th 2015 in [facility]. I called [name] and left a detail message to call back.

## 2014-01-20 NOTE — Telephone Encounter (Signed)
New message     Pt needs to be cleared to have her pedi cather removed.  She is scheduled to have it removed tomorrow but now they decided she needs to be cleared.  Please call [phone]---they do not know the doctor's name.

## 2014-01-21 ENCOUNTER — Ambulatory Visit (HOSPITAL_COMMUNITY): Payer: Medicare Other | Admitting: Physical Therapy

## 2014-01-21 NOTE — Telephone Encounter (Signed)
Spoke with pt's husband. Appt made for pt to see Richardson Dopp, PA on January 24, 2014 at 3:40 for surgical clearance. Pt's husband will contact surgeon's office and have them fax [place] information.

## 2014-01-21 NOTE — Telephone Encounter (Signed)
She is switching from peritoneal to hemodyalisis. Hasn't been here in over a year due to scheduling mixup. Needs cardiac clearance before they( [facility]) will do anything. Can she she a PA  for this?

## 2014-01-24 ENCOUNTER — Ambulatory Visit (INDEPENDENT_AMBULATORY_CARE_PROVIDER_SITE_OTHER): Payer: Medicare Other | Admitting: Physician Assistant

## 2014-01-24 ENCOUNTER — Encounter: Payer: Self-pay | Admitting: Physician Assistant

## 2014-01-24 ENCOUNTER — Ambulatory Visit (HOSPITAL_COMMUNITY)
Admission: RE | Admit: 2014-01-24 | Discharge: 2014-01-24 | Disposition: A | Discharge status: Home / Self Care | Payer: Medicare Other | Source: Ambulatory Visit | Attending: Family Medicine | Admitting: Family Medicine

## 2014-01-24 ENCOUNTER — Encounter (HOSPITAL_COMMUNITY): Payer: Self-pay | Admitting: Physical Therapy

## 2014-01-24 VITALS — BP 140/70 | HR 91 | Ht 65.0 in | Wt 149.0 lb

## 2014-01-24 DIAGNOSIS — R262 Difficulty in walking, not elsewhere classified: Secondary | ICD-10-CM | POA: Insufficient documentation

## 2014-01-24 DIAGNOSIS — N186 End stage renal disease: Secondary | ICD-10-CM

## 2014-01-24 DIAGNOSIS — Z8719 Personal history of other diseases of the digestive system: Secondary | ICD-10-CM

## 2014-01-24 DIAGNOSIS — I2510 Atherosclerotic heart disease of native coronary artery without angina pectoris: Secondary | ICD-10-CM

## 2014-01-24 DIAGNOSIS — E785 Hyperlipidemia, unspecified: Secondary | ICD-10-CM

## 2014-01-24 DIAGNOSIS — I5032 Chronic diastolic (congestive) heart failure: Secondary | ICD-10-CM

## 2014-01-24 DIAGNOSIS — I10 Essential (primary) hypertension: Secondary | ICD-10-CM

## 2014-01-24 DIAGNOSIS — Z8673 Personal history of transient ischemic attack (TIA), and cerebral infarction without residual deficits: Secondary | ICD-10-CM

## 2014-01-24 DIAGNOSIS — Z5189 Encounter for other specified aftercare: Secondary | ICD-10-CM | POA: Insufficient documentation

## 2014-01-24 DIAGNOSIS — Z951 Presence of aortocoronary bypass graft: Secondary | ICD-10-CM | POA: Insufficient documentation

## 2014-01-24 DIAGNOSIS — I4891 Unspecified atrial fibrillation: Secondary | ICD-10-CM

## 2014-01-24 DIAGNOSIS — M6281 Muscle weakness (generalized): Secondary | ICD-10-CM | POA: Insufficient documentation

## 2014-01-24 DIAGNOSIS — Z01810 Encounter for preprocedural cardiovascular examination: Secondary | ICD-10-CM

## 2014-01-24 NOTE — Progress Notes (Signed)
Cardiology Office Note   Date:  01/24/2014   ID:  [name], DOB 1945/04/21, MRN [id number]  PCP:  Shirline Frees, MD  Cardiologist:  Dr. Lauree Chandler     History of Present Illness: [name] is a 68 y.o. female with a history of the ESRD on hemodialysis, CAD status post DES 2 to the circumflex in 2008 and known CTO of the LAD, preserved LV function, prior TIA, HTN, HL, diabetes. Previously followed by Dr. Olevia Perches. She established with Dr. Angelena Form in 06/2011. She has not been seen since that time.   She was ultimately seen at [facility] and underwent CABG 3 (SVG-OM, SVG-D1, LIMA-LAD), mitral valve repair, maze procedure and excision of the left atrial appendage in 05/2013 with Dr. Lunette Stands.    She was admitted for upper GI bleed secondary to duodenal ulcer in 06/2013.  She was admitted for left MCA CVA and 08/2013. Notes indicate that atrial fibrillation was documented on admission. However, the patient was not placed on Coumadin or antiplatelet therapy given her recent GI bleed. Apparently, her gastroenterologist has recommended that she not take anticoagulation therapy until cleared with a follow-up EGD.  She was admitted to [facility] 11/2013 with acute hypoxic respiratory failure in the setting of acute on chronic diastolic heart failure, right-sided pleural effusion and HCAP. Patient underwent dialysis for 5 days in a row with improvement in her volume status.  She returns for surgical clearance.  She needs her peritoneal dialysis catheter removed as it is been nonfunctional. She has surgery planned at [place] with Dr. Hortencia Pilar.  The patient denies chest discomfort. She denies significant dyspnea. She is NYHA 2-2b. She denies PND. She has chronic LE edema. She has slept in a recliner for years. She denies syncope or near-syncope.   Studies:  - LHC (2/08):  Proximal LAD occluded, D1 80%, OM 95 and 70%, RCA 50%, EF 45-50% with anterior and apical  hypokinesis >> PCI: Taxus DES 2 to the OM  - LHC (at [facility] - 11/13):  Mid LAD occluded, mid circumflex 50%, mid RCA 40%, first RPL 40%, D2 70%, OM stent patent, EF 45%  - Echo (5/15):  Moderate LVH, EF 55-60%, normal wall motion, mitral valve repair okay, mild mitral stenosis (mean 8 mmHg), mild to moderate LAE  - Nuclear (1/13):  Abnormal stress nuclear study with a small to moderate size fixed anterolateral and apical defect suggestive of previous infarct; no ischemia.  EF 58%  - Carotid US (1/13):  Bilateral ICA 40-59%  >> Follow-up 1 year   Recent Labs/Images:  08/19/2013: LDL (calc) 42 12/02/2013: ALT 14; Pro B Natriuretic peptide (BNP) 36660.0*; TSH 2.220 12/08/2013: BUN 5*; Creatinine 2.41*; Hemoglobin 9.3*; Potassium 4.5; Sodium 139     Wt Readings from Last 3 Encounters:  01/24/14 149 lb (67.586 kg)  01/20/14 140 lb (63.504 kg)  12/28/13 147 lb (66.679 kg)     Past Medical History  Diagnosis Date  . Diabetes mellitus   . Hypertension   . Hyperlipidemia   . Coronary artery disease   . Carpal tunnel syndrome   . Anemia   . Colon polyps 01/2012    Per colonoscopy, Dr. Gala Romney  . Transfusion history 03-04-13    Transfusion x1 unit a month ago-[facility]  . Stroke 2010    2010-"brief weakness,tongue thickness,incoherent"-no residual affects.  . Mitral regurgitation   . Myocardial infarction 2009  . Peritonitis 06/23/2013  . Ovarian cancer 1995    [name]  . ESRD (end stage renal disease)     Started dialysis in 2009 with hemodialysis and did HD for about 5 years using a R arm AVF.  Changed to PD due to pt likes to travel and is independent.  Her nephrologist is Dr Hinda Lenis in [place], [place].       Current Outpatient Prescriptions  Medication Sig Dispense Refill  . ALPRAZolam (XANAX) 0.5 MG tablet Take 0.5 mg by mouth at bedtime as needed for anxiety.    . calcitRIOL (ROCALTROL) 0.5 MCG capsule Take 0.5 mcg by mouth daily.    . carvedilol (COREG)  3.125 MG tablet Take 1.5625 mg by mouth 2 (two) times daily with a meal. Take 1/2 tab in the morning and 1/2 in the evening.    Marland Kitchen EPINEPHrine 0.3 mg/0.3 mL IJ SOAJ injection Inject 0.3 mg into the muscle daily as needed (allergic reaction).    . insulin NPH-regular Human (HUMULIN 70/30) (70-30) 100 UNIT/ML injection Inject 10-20 Units into the skin 2 (two) times daily with a meal.    . meclizine (ANTIVERT) 25 MG tablet Take 12.5 mg by mouth 2 (two) times daily as needed for dizziness (Vertigo).     . multivitamin (RENA-VIT) TABS tablet Take 1 tablet by mouth every morning.    . pantoprazole (PROTONIX) 40 MG tablet Take 1 tablet (40 mg total) by mouth 2 (two) times daily. 180 tablet 3   No current facility-administered medications for this visit.     Allergies:   Lisinopril and Ezetimibe   Social History:  The patient  reports that she quit smoking about 55 years ago. She has never used smokeless tobacco. She reports that she does not drink alcohol or use illicit drugs.   Family History:  The patient's family history includes Diabetes in her mother; Stroke in her mother. There is no history of Heart attack.   ROS:  Please see the history of present illness.       All other systems reviewed and negative.    PHYSICAL EXAM: VS:  BP 140/70 mmHg  Pulse 91  Ht 5\' 5"  (1.651 m)  Wt 149 lb (67.586 kg)  BMI 24.79 kg/m2 Well nourished, well developed, in no acute distress HEENT: normal Neck:  no JVD Cardiac:  normal S1, S2;  RRR; no murmur Lungs:   clear to auscultation bilaterally, no wheezing, rhonchi or rales Abd: soft, nontender, no hepatomegaly Ext: tight bilateral LE edema edema Skin: warm and dry Neuro:  CNs 2-12 intact, no focal abnormalities noted  EKG:  NSR, HR 92, LAD, poor R wave progression, PVC      ASSESSMENT AND PLAN:  1.  Pre-operative cardiovascular examination:  The patient does not have any unstable cardiac conditions. She had CABG 7 months ago. She is not describing  any symptoms consistent with angina. Her ECG demonstrates no significant ST changes. I reviewed her case today with Dr. Meda Coffee (DOD). No further cardiac workup is warranted. Continue beta blocker therapy. She should resume aspirin when cleared by gastroenterology. She should be at acceptable risk for her noncardiac surgery. 2.  CAD:  No angina. Continue beta blocker. Follow-up with GI and resume aspirin when cleared. She is not on statin therapy for unclear reasons. This can be addressed in follow-up. 3.  Atrial fibrillation, unspecified:  I reviewed her hospital chart. It is not clear to me that she was in atrial fibrillation. In any event, she had a Maze procedure and left atrial appendage excision. The likelihood  that she would have CVA from atrial fibrillation is quite low. As noted, she should follow-up with gastroenterology. If Coumadin is not contraindicated, this could be considered in the future. 4.  Chronic diastolic heart failure: Volume management per dialysis. 5.  Essential hypertension:  Controlled. 6.  History of CVA (cerebrovascular accident):  Follow-up with neurology as planned. As noted, she needs follow-up with gastroenterology to clear her for aspirin therapy. 7.  ESRD (end stage renal disease):  She remains on Tuesday, Thursday, Saturday dialysis. 8.  HLD (hyperlipidemia):  As noted, she is not on statin therapy for unclear reasons. This can be reviewed in follow-up. 9.  History of duodenal ulcer:  I have asked her to follow-up with gastrologist. Resume aspirin when cleared.  Disposition:   I spent > 45 minutes reviewing the patient's chart and in direct patient care.  FU with Dr. Angelena Form in one month.   Signed, Versie Starks, MHS 01/24/2014 4:03 PM    [facility] [place], [place], [place]  [address] Phone: [phone]; Fax: [phone]

## 2014-01-24 NOTE — Therapy (Addendum)
Physical Therapy Treatment  Patient Details  Name: [name] MRN: [id number] Date of Birth: 04/23/1945  Encounter Date: 01/24/2014      PT End of Session - 01/24/14 1157    Visit Number 6   Number of Visits 12   Date for PT Re-Evaluation 02/02/14   PT Start Time 1110   PT Stop Time 1157   PT Time Calculation (min) 47 min   Activity Tolerance Patient tolerated treatment well      Past Medical History  Diagnosis Date  . Diabetes mellitus   . Hypertension   . Hyperlipidemia   . Coronary artery disease   . Carpal tunnel syndrome   . Anemia   . Colon polyps 01/2012    Per colonoscopy, Dr. Gala [name]  . Transfusion history 03-04-13    Transfusion x1 unit a month ago-[facility]  . Stroke 2010    2010-"brief weakness,tongue thickness,incoherent"-no residual affects.  . Mitral regurgitation   . Myocardial infarction 2009  . Peritonitis 06/23/2013  . Ovarian cancer 1995    De [name]-remission  . ESRD (end stage renal disease)     Started dialysis in 2009 with hemodialysis and did HD for about 5 years using a R arm AVF.  Changed to PD due to pt likes to travel and is independent.  Her nephrologist is Dr [name] in [place], [place].       Past Surgical History  Procedure Laterality Date  . Complete abdominal hysterectomy  1995  . Abdominal hysterectomy    . Colonoscopy with esophagogastroduodenoscopy (egd)  01/27/2012    Dr. Gala [name]. Small hiatal hernia, abnormal second portion of the duodenum with questionable extrinsic compression. Single left sided colonic diverticulum, 2 colon polyps. Hamartomatous colon polyp  . Eus  04/09/2012    Dr. Owens [name]: Nonspecific edema/thickening of the periampullary duodenum. Unremarkable biopsy  . Tonsillectomy      child  . Peritoneal catheter insertion  10/30/2012    for peritoneal dialysis  . Back surgery  2009    x3 level fusion-Dr. Louanne [name]  . Cardiac catheterization      x2 stents-'09- Dr. Joellen [name]  .  Esophagogastroduodenoscopy (egd) with propofol N/A 03/11/2013    Dr. Owens [name], small duodenal diverticulum. Edematous duodenal mucosa with friability on biopsy (pathology negative)  . Three-vessel cabg, mitral valve annuloplasty, may ease  06/17/2013    [facility]  . Coronary artery bypass graft    . Esophagogastroduodenoscopy N/A 07/02/2013    SLF:GI BLEED DUE TO LARGE DUODENAL ULCER/Small hiatal hernia/PYLORIC CHANNEL ULCER/Multiple CLEANED BASED ulcers in the duodenal bulb and 2nd part of the duodenum  . Givens capsule study N/A 07/02/2013    Procedure: GIVENS CAPSULE STUDY;  Surgeon: [name] Binder, MD;  [facility];  Service: Endoscopy;  Laterality: N/A;  possible deployment in EGD negative  . Radiology with anesthesia N/A 08/19/2013    Procedure: RADIOLOGY WITH ANESTHESIA;  Surgeon: [name] Hickman, MD;  Location: [facility];  Service: Radiology;  Laterality: N/A;    There were no vitals taken for this visit.  Visit Diagnosis:  Difficulty walking   Pt states she fell last Thursday trying to get dressed.  Pt states she has no pain states she never has pain.        Adult PT Treatment/Exercise - 01/24/14 0700    Exercises Knee/Hip;Balance   Gastroc Stretch 3 reps;30 seconds;Limitations   Forward Lunges Both;5 reps;Limitations   Forward Lunges Limitations on 4" step    Forward Step Up 5  reps   Functional Squat 5 reps;2 sets   SLS x 3 reps each    Other Standing Knee Exercises Tband exercises scapular retraction, rows, shld extension x 10 each green    Other Standing Knee Exercises Tandem stance x 3, hip abduction with 4# x 10   Long Arc Quad Both;10 reps;Weights   Long Arc Quad Weight 4 lbs.      Pt did well with all exercises.  Pt needed cuing to keep breathing as well as cuing during balance activity as pt tends to allow her center of gravity to go outside her base of support without attempting to self recover.  Pt needed multiple short rest breaks.       Plan:  Continue focusing on strengthening and balance.       Problem List Patient Active Problem List   Diagnosis Date Noted  . Difficulty walking 01/03/2014  . Poor balance 01/03/2014  . Weakness of both legs 01/03/2014  . Acute respiratory failure with hypoxia 12/03/2013  . Pneumonia 12/02/2013  . HCAP (healthcare-associated pneumonia) 12/02/2013  . Acute on chronic diastolic heart failure 88/41/6606  . Hypokalemia 10/24/2013  . Diarrhea 10/24/2013  . Pulmonary edema, noncardiac 10/24/2013  . Appetite loss 10/24/2013  . Nausea with vomiting 10/24/2013  . Atrial fibrillation 08/23/2013  . 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 History of duodenal ulcer 07/16/2013  . Type II or unspecified type diabetes mellitus with renal manifestations, uncontrolled 06/29/2013  . History of angioedema 08/30/2012  . Anemia 01/24/2012  . HYPERLIPIDEMIA-MIXED 10/02/2008  . CAD, NATIVE VESSEL 10/02/2008  . Chronic diastolic heart failure 30/16/0109  . HYPERTENSION 09/17/2008  . ESRD on peritoneal dialysis 09/17/2008                                            [name],[name] 01/24/2014, 12:00 PM

## 2014-01-24 NOTE — Patient Instructions (Signed)
Please contact your GI physician to determine when you can resume Aspirin.  Your physician recommends that you schedule a follow-up appointment with Dr Angelena Form within the month.

## 2014-01-24 NOTE — Telephone Encounter (Signed)
Thanks, chris 

## 2014-01-25 ENCOUNTER — Telehealth: Payer: Self-pay | Admitting: Physician Assistant

## 2014-01-25 NOTE — Telephone Encounter (Signed)
Received request from Nurse fax box, documents faxed for surgical clearance. To: [facility] Fax [phone]  Attention: 11.3.15./km

## 2014-01-25 NOTE — Telephone Encounter (Signed)
Received request from Nurse fax box, documents faxed for surgical clearance. To: [facility] Fax [phone]  Attention: 11.3.15/km

## 2014-01-26 ENCOUNTER — Ambulatory Visit (HOSPITAL_COMMUNITY)
Admission: RE | Admit: 2014-01-26 | Discharge: 2014-01-26 | Disposition: A | Discharge status: Home / Self Care | Payer: Medicare Other | Source: Ambulatory Visit | Attending: Family Medicine | Admitting: Family Medicine

## 2014-01-26 DIAGNOSIS — Z5189 Encounter for other specified aftercare: Secondary | ICD-10-CM | POA: Diagnosis not present

## 2014-01-26 DIAGNOSIS — R2689 Other abnormalities of gait and mobility: Secondary | ICD-10-CM

## 2014-01-26 DIAGNOSIS — R29898 Other symptoms and signs involving the musculoskeletal system: Secondary | ICD-10-CM

## 2014-01-26 DIAGNOSIS — R262 Difficulty in walking, not elsewhere classified: Secondary | ICD-10-CM

## 2014-01-26 NOTE — Therapy (Signed)
Physical Therapy Treatment  Patient Details  Name: [name] MRN: [id number] Date of Birth: 1946-02-21  Encounter Date: 01/26/2014      PT End of Session - 01/26/14 1207    Date for PT Re-Evaluation 02/02/14   Authorization Type medicare   Authorization - Visit Number 7   Authorization - Number of Visits 10      Past Medical History  Diagnosis Date  . Diabetes mellitus   . Hypertension   . Hyperlipidemia   . Coronary artery disease   . Carpal tunnel syndrome   . Anemia   . Colon polyps 01/2012    Per colonoscopy, Dr. Gala Romney  . Transfusion history 03-04-13    Transfusion x1 unit a month ago-[facility]  . Stroke 2010    2010-"brief weakness,tongue thickness,incoherent"-no residual affects.  . Mitral regurgitation   . Myocardial infarction 2009  . Peritonitis 06/23/2013  . Ovarian cancer 1995    [name]  . ESRD (end stage renal disease)     Started dialysis in 2009 with hemodialysis and did HD for about 5 years using a R arm AVF.  Changed to PD due to pt likes to travel and is independent.  Her nephrologist is Dr Hinda Lenis in [place], [place].       Past Surgical History  Procedure Laterality Date  . Complete abdominal hysterectomy  1995  . Abdominal hysterectomy    . Colonoscopy with esophagogastroduodenoscopy (egd)  01/27/2012    Dr. Gala Romney. Small hiatal hernia, abnormal second portion of the duodenum with questionable extrinsic compression. Single left sided colonic diverticulum, 2 colon polyps. Hamartomatous colon polyp  . Eus  04/09/2012    Dr. Owens Loffler: Nonspecific edema/thickening of the periampullary duodenum. Unremarkable biopsy  . Tonsillectomy      child  . Peritoneal catheter insertion  10/30/2012    for peritoneal dialysis  . Back surgery  2009    x3 level fusion-Dr. Louanne Skye  . Cardiac catheterization      x2 stents-'09- Dr. Joellen Jersey  . Esophagogastroduodenoscopy (egd) with propofol N/A 03/11/2013    Dr. Owens Loffler, small  duodenal diverticulum. Edematous duodenal mucosa with friability on biopsy (pathology negative)  . Three-vessel cabg, mitral valve annuloplasty, may ease  06/17/2013    [facility]  . Coronary artery bypass graft    . Esophagogastroduodenoscopy N/A 07/02/2013    SLF:GI BLEED DUE TO LARGE DUODENAL ULCER/Small hiatal hernia/PYLORIC CHANNEL ULCER/Multiple CLEANED BASED ulcers in the duodenal bulb and 2nd part of the duodenum  . Givens capsule study N/A 07/02/2013    Procedure: GIVENS CAPSULE STUDY;  Surgeon: Danie Binder, MD;  [facility];  Service: Endoscopy;  Laterality: N/A;  possible deployment in EGD negative  . Radiology with anesthesia N/A 08/19/2013    Procedure: RADIOLOGY WITH ANESTHESIA;  Surgeon: Rob Hickman, MD;  Location: [facility];  Service: Radiology;  Laterality: N/A;    There were no vitals taken for this visit.  Visit Diagnosis:  Difficulty walking  Poor balance  Weakness of both legs          Adult PT Treatment/Exercise - 01/26/14 0700    Knee/Hip Exercises: Stretches   Gastroc Stretch 3 reps;30 seconds;Limitations   Knee/Hip Exercises: Aerobic   Stationary Bike nustep 8 minutes hills #1, level 2 SPM avg 50 LE's   Knee/Hip Exercises: Standing   Heel Raises 10 reps;Limitations   Heel Raises Limitations toeraises 10 reps   Forward Lunges Both;10 reps   Forward Lunges Limitations on 4" step  Lateral Step Up Both;10 reps;Hand Hold: 1;Step Height: 4"   Forward Step Up 10 reps;Both;Hand Hold: 1;Step Height: 4"   Functional Squat 10 reps;Limitations   Functional Squat Limitations with bilateral UE assist   Other Standing Knee Exercises Tband exercises scapular retraction, rows, shld extension x 10 each green             PT Short Term Goals - 01/26/14 1202    PT SHORT TERM GOAL #1   Title Pt/caregiver will Perform Home Exercise Program: For increased strengthening   Time 2   Period Weeks   Status On-going   PT SHORT TERM GOAL #2   Title Pt  to be able to verbalize the importance of diapharagmic breathing     Time 2   Period Weeks   Status On-going   PT SHORT TERM GOAL #3   Title Pt to be able to ambulate with walker for five minutes without stopping for improved safety     Time 2   Period Weeks   Status On-going   PT SHORT TERM GOAL #4   Title Pt to increase core strength to be able to stand for three minutes to be able to brush teeth at bathroom counter    Time 2   Period Weeks   Status On-going   PT SHORT TERM GOAL #5   Title Pt to improve balance to  be able to SLS for five seconds to reduce risk of fall     Time 2   Period Weeks   Status On-going          PT Long Term Goals - 01/26/14 1205    PT LONG TERM GOAL #1   Title Independent in advance HEP   Time 4   Period Weeks   Status On-going   PT LONG TERM GOAL #2   Title PT to be able to  Increase strength to  come from sit to stand on one attempt     Time 4   Period Weeks   Status On-going   PT LONG TERM GOAL #3   Title Pt to be able to ambulate x 15 minutes with walker without stopping for better health habits     Time 4   Period Weeks   Status On-going   PT LONG TERM GOAL #4   Title Pt to increase core strength to be able to stand for five minutes to be able to complete self grooming activity.   Time 4   Period Weeks   Status On-going          Plan - 01/26/14 1150    Clinical Impression Statement Progressed reps with therex today and added nustep to increase actvity.  O2 sats did not fall below 85% today.  Pt needing less cues with diaphragnmatic breathing.  Total seated rest breaks today 4 during session.  Overall improving with less fatigue noted and no c/o pain.   PT Plan Continue with PT POC. Continue to educate pt in diapharagmic breathing with activity to improve activity tolerance.  Progress balance activities and continue to monitior O2 saturation.        Problem List Patient Active Problem List   Diagnosis Date Noted  .  Difficulty walking 01/03/2014  . Poor balance 01/03/2014  . Weakness of both legs 01/03/2014  . Acute respiratory failure with hypoxia 12/03/2013  . Pneumonia 12/02/2013  . HCAP (healthcare-associated pneumonia) 12/02/2013  . Acute on chronic diastolic heart failure 16/12/9602  . Hypokalemia 10/24/2013  .  Diarrhea 10/24/2013  . Pulmonary edema, noncardiac 10/24/2013  . Appetite loss 10/24/2013  . Nausea with vomiting 10/24/2013  . Atrial fibrillation 08/23/2013  . History of CVA (cerebrovascular accident) 08/19/2013  . History of duodenal ulcer 07/16/2013  . Type II or unspecified type diabetes mellitus with renal manifestations, uncontrolled 06/29/2013  . History of angioedema 08/30/2012  . Anemia 01/24/2012  . HLD (hyperlipidemia) 10/02/2008  . CAD, NATIVE VESSEL 10/02/2008  . Chronic diastolic heart failure 97/53/0051  . Essential hypertension 09/17/2008  . ESRD (end stage renal disease) 09/17/2008       Teena Irani, PTA/CLT 01/26/2014, 12:08 PM

## 2014-01-28 ENCOUNTER — Encounter (HOSPITAL_COMMUNITY): Payer: Self-pay

## 2014-01-28 ENCOUNTER — Ambulatory Visit (HOSPITAL_COMMUNITY)
Admission: RE | Admit: 2014-01-28 | Discharge: 2014-01-28 | Disposition: A | Discharge status: Home / Self Care | Payer: Medicare Other | Source: Ambulatory Visit | Attending: Family Medicine | Admitting: Family Medicine

## 2014-01-28 DIAGNOSIS — Z5189 Encounter for other specified aftercare: Secondary | ICD-10-CM | POA: Diagnosis not present

## 2014-01-28 DIAGNOSIS — R2689 Other abnormalities of gait and mobility: Secondary | ICD-10-CM

## 2014-01-28 DIAGNOSIS — R262 Difficulty in walking, not elsewhere classified: Secondary | ICD-10-CM

## 2014-01-28 DIAGNOSIS — R29898 Other symptoms and signs involving the musculoskeletal system: Secondary | ICD-10-CM

## 2014-01-28 NOTE — Patient Instructions (Signed)
Dorsiflexion: Resisted   Facing anchor, tubing around left foot, pull toward face.  Repeat 10-20  times per set. Do 2  sets per session. Do 2 sessions per day.  http://orth.exer.us/9   Copyright  VHI. All rights reserved.    Ankle Plantar Flexion / Dorsiflexion, Standing   Stand while holding a stable object. Rise up on toes. Then rock back on heels. Hold each position 3 seconds. Repeat 10-20  times per session. Do 2 sessions per day.  Copyright  VHI. All rights reserved.   Flexibility: Neck Retraction   Pull head straight back, keeping eyes and jaw level. Repeat 10-15  times per set. Do 2 sets per session. Do 3-4 sessions per day.  http://orth.exer.us/345   Copyright  VHI. All rights reserved.    SCAPULA: Retraction   Sitting up nice and tall, looking up with chin tucked.  Marland Kitchen Pinch shoulder blades together. Do not shrug shoulders. Hold 5 seconds. 10-15 reps per set, 2sets per day, 3-4 days per week  Copyright  VHI. All rights reserved.

## 2014-01-28 NOTE — Therapy (Signed)
Physical Therapy Treatment  Patient Details  Name: [name] MRN: [id number] Date of Birth: September 24, 1945  Encounter Date: 01/28/2014      PT End of Session - 01/28/14 1208    Visit Number 8   Number of Visits 10   Date for PT Re-Evaluation 02/02/14   Authorization Type medicare   Authorization - Visit Number 8   Authorization - Number of Visits 10   PT Start Time 4627   PT Stop Time 1205   PT Time Calculation (min) 52 min   PT Charge Details TE [id number]   Equipment Utilized During Treatment Gait belt   Activity Tolerance Patient tolerated treatment well;Patient limited by fatigue      Past Medical History  Diagnosis Date  . Diabetes mellitus   . Hypertension   . Hyperlipidemia   . Coronary artery disease   . Carpal tunnel syndrome   . Anemia   . Colon polyps 01/2012    Per colonoscopy, Dr. Gala Romney  . Transfusion history 03-04-13    Transfusion x1 unit a month ago-[facility]  . Stroke 2010    2010-"brief weakness,tongue thickness,incoherent"-no residual affects.  . Mitral regurgitation   . Myocardial infarction 2009  . Peritonitis 06/23/2013  . Ovarian cancer 1995    [facility] [name]  . ESRD (end stage renal disease)     Started dialysis in 2009 with hemodialysis and did HD for about 5 years using a R arm AVF.  Changed to PD due to pt likes to travel and is independent.  Her nephrologist is Dr Hinda Lenis in [place], [place].       Past Surgical History  Procedure Laterality Date  . Complete abdominal hysterectomy  1995  . Abdominal hysterectomy    . Colonoscopy with esophagogastroduodenoscopy (egd)  01/27/2012    Dr. Gala Romney. Small hiatal hernia, abnormal second portion of the duodenum with questionable extrinsic compression. Single left sided colonic diverticulum, 2 colon polyps. Hamartomatous colon polyp  . Eus  04/09/2012    Dr. Owens Loffler: Nonspecific edema/thickening of the periampullary duodenum. Unremarkable biopsy  . Tonsillectomy      child  .  Peritoneal catheter insertion  10/30/2012    for peritoneal dialysis  . Back surgery  2009    x3 level fusion-Dr. Louanne Skye  . Cardiac catheterization      x2 stents-'09- Dr. Joellen Jersey  . Esophagogastroduodenoscopy (egd) with propofol N/A 03/11/2013    Dr. Owens Loffler, small duodenal diverticulum. Edematous duodenal mucosa with friability on biopsy (pathology negative)  . Three-vessel cabg, mitral valve annuloplasty, may ease  06/17/2013    [facility]  . Coronary artery bypass graft    . Esophagogastroduodenoscopy N/A 07/02/2013    SLF:GI BLEED DUE TO LARGE DUODENAL ULCER/Small hiatal hernia/PYLORIC CHANNEL ULCER/Multiple CLEANED BASED ulcers in the duodenal bulb and 2nd part of the duodenum  . Givens capsule study N/A 07/02/2013    Procedure: GIVENS CAPSULE STUDY;  Surgeon: Danie Binder, MD;  [facility];  Service: Endoscopy;  Laterality: N/A;  possible deployment in EGD negative  . Radiology with anesthesia N/A 08/19/2013    Procedure: RADIOLOGY WITH ANESTHESIA;  Surgeon: Rob Hickman, MD;  Location: [facility];  Service: Radiology;  Laterality: N/A;    SpO2 90%  Visit Diagnosis:  Difficulty walking  Poor balance  Weakness of both legs    S:Pt stated she is feeling stronger since day one, not feeling the best today.  Had diarrhea yesterday evening.        [place]  Adult PT Treatment/Exercise - 01/28/14 1219    Neck Exercises: Standing   Neck Retraction 10 reps;5 secs;Limitations   Knee/Hip Exercises: Stretches   Gastroc Stretch 3 reps;30 seconds;Limitations   Gastroc Stretch Limitations slant board   Knee/Hip Exercises: Aerobic   Stationary Bike held due to fatigue          Education - 01/28/14 1208    Education provided Yes   Education Details Patient   Methods Explanation;Demonstration;Handout   Comprehension Verbalized understanding;Returned demonstration          PT Short Term Goals - 01/28/14 1217    PT SHORT TERM GOAL #1   Title  Pt/caregiver will Perform Home Exercise Program: For increased strengthening   Status On-going   PT SHORT TERM GOAL #2   Title Pt to be able to verbalize the importance of diapharagmic breathing     Status On-going   PT SHORT TERM GOAL #3   Title Pt to be able to ambulate with walker for five minutes without stopping for improved safety     PT SHORT TERM GOAL #4   Title Pt to increase core strength to be able to stand for three minutes to be able to brush teeth at bathroom counter    Status On-going   PT SHORT TERM GOAL #5   Title Pt to improve balance to  be able to SLS for five seconds to reduce risk of fall            PT Long Term Goals - 01/28/14 1218    PT LONG TERM GOAL #1   Title Independent in advance HEP   Status On-going   PT LONG TERM GOAL #2   Title PT to be able to  Increase strength to  come from sit to stand on one attempt     Status On-going   PT LONG TERM GOAL #3   Title Pt to be able to ambulate x 15 minutes with walker without stopping for better health habits     PT LONG TERM GOAL #4   Title Pt to increase core strength to be able to stand for five minutes to be able to complete self grooming activity.          Plan - 01/28/14 1212    Clinical Impression Statement Pt improving activity tollerance and less cueing required for diaphragmatic breathing.  O2 sats range from 89-94% this session.  Session focus on posture awareness and  strengthening and LE strengthening.  Noted limited dorsiflexion ROM and strength Rt>Lt, added seated theraband dorsi and plantar flexion and slant board gastroc stretches.  Pt given HEP printout including tband exercises and cerviceal/ scapular retraction.   PT Plan Continue with PT POC. Continue to educate pt in diapharagmic breathing with activity to improve activity tolerance.  Progress balance activities and continue to monitior O2 saturation.        Problem List Patient Active Problem List   Diagnosis Date Noted  .  Difficulty walking 01/03/2014  . Poor balance 01/03/2014  . Weakness of both legs 01/03/2014  . Acute respiratory failure with hypoxia 12/03/2013  . Pneumonia 12/02/2013  . HCAP (healthcare-associated pneumonia) 12/02/2013  . Acute on chronic diastolic heart failure 04/54/0981  . Hypokalemia 10/24/2013  . Diarrhea 10/24/2013  . Pulmonary edema, noncardiac 10/24/2013  . Appetite loss 10/24/2013  . Nausea with vomiting 10/24/2013  . Atrial fibrillation 08/23/2013  . History of CVA (cerebrovascular accident) 08/19/2013  . History of duodenal ulcer 07/16/2013  .  Type II or unspecified type diabetes mellitus with renal manifestations, uncontrolled 06/29/2013  . History of angioedema 08/30/2012  . Anemia 01/24/2012  . HLD (hyperlipidemia) 10/02/2008  . CAD, NATIVE VESSEL 10/02/2008  . Chronic diastolic heart failure 44/81/8563  . Essential hypertension 09/17/2008  . ESRD (end stage renal disease) 09/17/2008   Aldona Lento, PTA Aldona Lento 01/28/2014, 12:21 PM

## 2014-01-31 ENCOUNTER — Ambulatory Visit (HOSPITAL_COMMUNITY)
Admission: RE | Admit: 2014-01-31 | Discharge: 2014-01-31 | Disposition: A | Discharge status: Home / Self Care | Payer: Medicare Other | Source: Ambulatory Visit | Attending: Family Medicine | Admitting: Family Medicine

## 2014-01-31 ENCOUNTER — Encounter (HOSPITAL_COMMUNITY): Payer: Self-pay | Admitting: Physical Therapy

## 2014-01-31 ENCOUNTER — Telehealth: Payer: Self-pay | Admitting: Internal Medicine

## 2014-01-31 DIAGNOSIS — R2689 Other abnormalities of gait and mobility: Secondary | ICD-10-CM

## 2014-01-31 DIAGNOSIS — R29898 Other symptoms and signs involving the musculoskeletal system: Secondary | ICD-10-CM

## 2014-01-31 DIAGNOSIS — Z5189 Encounter for other specified aftercare: Secondary | ICD-10-CM | POA: Diagnosis not present

## 2014-01-31 DIAGNOSIS — R262 Difficulty in walking, not elsewhere classified: Secondary | ICD-10-CM

## 2014-01-31 NOTE — Telephone Encounter (Signed)
Patient has upcoming apt in December.  Dr. Gala Romney took her off aspirin regime and her primary physician told her she needed to get back on it due to past strokes and risk of a future one.  Please advise

## 2014-01-31 NOTE — Therapy (Addendum)
Physical Therapy Treatment  Patient Details  Name: [name] MRN: [id number] Date of Birth: [birth year]  Encounter Date: 01/31/2014      PT End of Session - 01/31/14 1157    Visit Number 9   Number of Visits 10   Date for PT Re-Evaluation 02/02/14   Authorization - Visit Number 8   Authorization - Number of Visits 10   PT Start Time 1110   PT Stop Time 7353   PT Time Calculation (min) 46 min      Past Medical History  Diagnosis Date  . Diabetes mellitus   . Hypertension   . Hyperlipidemia   . Coronary artery disease   . Carpal tunnel syndrome   . Anemia   . Colon polyps 01/2012    Per colonoscopy, Dr. Gala [name]  . Transfusion history 03-04-13    Transfusion x1 unit a month ago-[facility]  . Stroke 2010    2010-"brief weakness,tongue thickness,incoherent"-no residual affects.  . Mitral regurgitation   . Myocardial infarction 2009  . Peritonitis 06/23/2013  . Ovarian cancer 1995    [facility] [name]-remission  . ESRD (end stage renal disease)     Started dialysis in 2009 with hemodialysis and did HD for about 5 years using a R arm AVF.  Changed to PD due to pt likes to travel and is independent.  Her nephrologist is Dr [name] in [place], [place].       Past Surgical History  Procedure Laterality Date  . Complete abdominal hysterectomy  1995  . Abdominal hysterectomy    . Colonoscopy with esophagogastroduodenoscopy (egd)  01/27/2012    Dr. Gala [name]. Small hiatal hernia, abnormal second portion of the duodenum with questionable extrinsic compression. Single left sided colonic diverticulum, 2 colon polyps. Hamartomatous colon polyp  . Eus  04/09/2012    Dr. Owens [name]: Nonspecific edema/thickening of the periampullary duodenum. Unremarkable biopsy  . Tonsillectomy      child  . Peritoneal catheter insertion  10/30/2012    for peritoneal dialysis  . Back surgery  2009    x3 level fusion-Dr. Louanne [name]  . Cardiac catheterization      x2 stents-'09- Dr. Joellen [name]  .  Esophagogastroduodenoscopy (egd) with propofol N/A 03/11/2013    Dr. Owens [name], small duodenal diverticulum. Edematous duodenal mucosa with friability on biopsy (pathology negative)  . Three-vessel cabg, mitral valve annuloplasty, may ease  06/17/2013    [facility]  . Coronary artery bypass graft    . Esophagogastroduodenoscopy N/A 07/02/2013    SLF:GI BLEED DUE TO LARGE DUODENAL ULCER/Small hiatal hernia/PYLORIC CHANNEL ULCER/Multiple CLEANED BASED ulcers in the duodenal bulb and 2nd part of the duodenum  . Givens capsule study N/A 07/02/2013    Procedure: GIVENS CAPSULE STUDY;  Surgeon: [name] Binder, MD;  [facility];  Service: Endoscopy;  Laterality: N/A;  possible deployment in EGD negative  . Radiology with anesthesia N/A 08/19/2013    Procedure: RADIOLOGY WITH ANESTHESIA;  Surgeon: [name] Hickman, MD;  Location: [facility];  Service: Radiology;  Laterality: N/A;    There were no vitals taken for this visit.  Visit Diagnosis:  Difficulty walking  Poor balance  Weakness of both legs   [name]; denies any pain.        [facility] Adult PT Treatment/Exercise - 01/31/14 1122    Knee/Hip Exercises: Standing   Heel Raises 10 reps   Forward Lunges 10 reps;5 reps   Forward Step Up 5 reps   Functional Squat  10 reps   Other Standing Knee Exercises Tband exercises scapular retraction, rows, shld extension x 10 each green    Other Standing Knee Exercises sit to stand x 10 ;     Knee/Hip Exercises: Seated   Long Arc Quad 10 reps   Long Arc Quad Weight 5 lbs.   Other Seated Knee Exercises cervical retraction  x 10                 Plan - 01/31/14 1200    PT Plan Begin cone rotation;  Pt will need to be reassessed at the end of this week; gcode needed next visit         Problem List Patient Active Problem List   Diagnosis Date Noted  . Difficulty walking 01/03/2014  . Poor balance 01/03/2014  . Weakness of both legs 01/03/2014  .  Acute respiratory failure with hypoxia 12/03/2013  . Pneumonia 12/02/2013  . HCAP (healthcare-associated pneumonia) 12/02/2013  . Acute on chronic diastolic heart failure 26/41/5830  . Hypokalemia 10/24/2013  . Diarrhea 10/24/2013  . Pulmonary edema, noncardiac 10/24/2013  . Appetite loss 10/24/2013  . Nausea with vomiting 10/24/2013  . Atrial fibrillation 08/23/2013  . History of CVA (cerebrovascular accident) 08/19/2013  . History of duodenal ulcer 07/16/2013  . Type II or unspecified type diabetes mellitus with renal manifestations, uncontrolled 06/29/2013  . History of angioedema 08/30/2012  . Anemia 01/24/2012  . HLD (hyperlipidemia) 10/02/2008  . CAD, NATIVE VESSEL 10/02/2008  . Chronic diastolic heart failure 94/09/6806  . Essential hypertension 09/17/2008  . ESRD (end stage renal disease) 09/17/2008        RUSSELL,[name] PT 01/31/2014, 12:01 PM

## 2014-02-01 NOTE — Telephone Encounter (Signed)
Tried to call pt back to get more information- NA  SLF held ASA in April after hospitalization. Pt was to return in June to set up repeat EGD with RMR and no showed for appt. Pt also had ov in May and no showed for that too. Pt was on plavix at that time.

## 2014-02-01 NOTE — Addendum Note (Signed)
Encounter addended by: Leeroy Cha, PT on: 02/01/2014  2:08 PM<BR>     Documentation filed: Flowsheet VN, Clinical Notes

## 2014-02-01 NOTE — Telephone Encounter (Signed)
She needs an OV to sort out; pt needs to keep her appt

## 2014-02-02 ENCOUNTER — Encounter: Payer: Self-pay | Admitting: Neurology

## 2014-02-02 ENCOUNTER — Ambulatory Visit (HOSPITAL_COMMUNITY)
Admission: RE | Admit: 2014-02-02 | Discharge: 2014-02-02 | Disposition: A | Discharge status: Home / Self Care | Payer: Medicare Other | Source: Ambulatory Visit | Attending: Family Medicine | Admitting: Family Medicine

## 2014-02-02 DIAGNOSIS — Z5189 Encounter for other specified aftercare: Secondary | ICD-10-CM | POA: Diagnosis not present

## 2014-02-02 DIAGNOSIS — R29898 Other symptoms and signs involving the musculoskeletal system: Secondary | ICD-10-CM

## 2014-02-02 DIAGNOSIS — R262 Difficulty in walking, not elsewhere classified: Secondary | ICD-10-CM

## 2014-02-02 DIAGNOSIS — R2689 Other abnormalities of gait and mobility: Secondary | ICD-10-CM

## 2014-02-02 NOTE — Therapy (Addendum)
Physical Therapy Evaluation  Patient Details  Name: [name] MRN: [id number] Date of Birth: [birth year]  Encounter Date: 02/02/2014    Past Medical History  Diagnosis Date  . Diabetes mellitus   . Hypertension   . Hyperlipidemia   . Coronary artery disease   . Carpal tunnel syndrome   . Anemia   . Colon polyps 01/2012    Per colonoscopy, Dr. Gala Romney  . Transfusion history 03-04-13    Transfusion x1 unit a month ago-[facility]  . Stroke 2010    2010-"brief weakness,tongue thickness,incoherent"-no residual affects.  . Mitral regurgitation   . Myocardial infarction 2009  . Peritonitis 06/23/2013  . Ovarian cancer 1995    [name]  . ESRD (end stage renal disease)     Started dialysis in 2009 with hemodialysis and did HD for about 5 years using a R arm AVF.  Changed to PD due to pt likes to travel and is independent.  Her nephrologist is Dr Hinda Lenis in [place], [place].       Past Surgical History  Procedure Laterality Date  . Complete abdominal hysterectomy  1995  . Abdominal hysterectomy    . Colonoscopy with esophagogastroduodenoscopy (egd)  01/27/2012    Dr. Gala Romney. Small hiatal hernia, abnormal second portion of the duodenum with questionable extrinsic compression. Single left sided colonic diverticulum, 2 colon polyps. Hamartomatous colon polyp  . Eus  04/09/2012    Dr. Owens Loffler: Nonspecific edema/thickening of the periampullary duodenum. Unremarkable biopsy  . Tonsillectomy      child  . Peritoneal catheter insertion  10/30/2012    for peritoneal dialysis  . Back surgery  2009    x3 level fusion-Dr. Louanne Skye  . Cardiac catheterization      x2 stents-'09- Dr. Joellen Jersey  . Esophagogastroduodenoscopy (egd) with propofol N/A 03/11/2013    Dr. Owens Loffler, small duodenal diverticulum. Edematous duodenal mucosa with friability on biopsy (pathology negative)  . Three-vessel cabg, mitral valve annuloplasty, may ease  06/17/2013    [facility]  .  Coronary artery bypass graft    . Esophagogastroduodenoscopy N/A 07/02/2013    SLF:GI BLEED DUE TO LARGE DUODENAL ULCER/Small hiatal hernia/PYLORIC CHANNEL ULCER/Multiple CLEANED BASED ulcers in the duodenal bulb and 2nd part of the duodenum  . Givens capsule study N/A 07/02/2013    Procedure: GIVENS CAPSULE STUDY;  Surgeon: Danie Binder, MD;  [facility];  Service: Endoscopy;  Laterality: N/A;  possible deployment in EGD negative  . Radiology with anesthesia N/A 08/19/2013    Procedure: RADIOLOGY WITH ANESTHESIA;  Surgeon: Rob Hickman, MD;  Location: [facility];  Service: Radiology;  Laterality: N/A;    There were no vitals taken for this visit.  Visit Diagnosis:  Difficulty walking  Poor balance  Weakness of both legs      Subjective Assessment - 02/02/14 1134    Symptoms Pt states she is feeling better; doing exercises at home.    Pertinent History DM, Renal failure,  Pt had triple bypass surgery in May and suffered a stroke.     How long can you sit comfortably? no problem    How long can you stand comfortably? able to stand for five to 10 minutes was 1 minute 20 second.    How long can you walk comfortably? Pt is using her walker and walking for five minutes without stooping was  a walker and walks less than five minutes at a time.    Currently in Pain? No/denies  [facility] PT Assessment - 02/02/14 1138    AROM   Right Ankle Dorsiflexion -10  was -30   Left Ankle Dorsiflexion 0  was -10   Strength   Right Hip Flexion 3+/5  was 3-/5   Right Hip Extension 3-/5  was 2+/5   Right Hip ABduction 3+/5  was 3-/5    Left Hip Flexion 3+/5  was 3-/5   Left Hip Extension 3-/5  was 2+/5   Left Hip ABduction 3+/5  was 3-/5   Right Knee Flexion --  4-/5 was 3/5   Right Knee Extension 5/5  was 5/5   Left Knee Flexion --  4-/5 was 3/5   Left Knee Extension 5/5  was 5/5   Right Ankle Dorsiflexion 3/5  was 3-/5   Left Ankle Dorsiflexion 3+/5  was 3+/5    Ambulation/Gait   Ambulation Distance (Feet) 135 Feet  was 80 ft           [facility] Adult PT Treatment/Exercise - 02/02/14 1138    Knee/Hip Exercises: Standing   Other Standing Knee Exercises tandem stance x 3/ cone rotation on ground x 1    Other Standing Knee Exercises sit to stand x 10 ;     Knee/Hip Exercises: Seated   Long Arc Quad 10 reps   Long Arc Quad Weight 5 lbs.   Other Seated Knee Exercises Diagraphmatic breathing multiple sets through session to increase O2 sat            PT Short Term Goals - 02/02/14 1152    PT SHORT TERM GOAL #1   Title Pt/caregiver will Perform Home Exercise Program: For increased strengthening   Time 2   Period Weeks   Status Achieved   PT SHORT TERM GOAL #2   Title Pt to be able to verbalize the importance of diapharagmic breathing     Time 2   Period Weeks   Status Achieved   PT SHORT TERM GOAL #3   Title Pt to be able to ambulate with walker for five minutes without stopping for improved safety     Time 2   Status Achieved   PT SHORT TERM GOAL #4   Title Pt to increase core strength to be able to stand for three minutes to be able to brush teeth at bathroom counter    Time 2   Status Achieved   PT SHORT TERM GOAL #5   Title Pt to improve balance to  be able to SLS for five seconds to reduce risk of fall     Time 2   Status Not Met          PT Long Term Goals - 02/02/14 1154    PT LONG TERM GOAL #1   Title Independent in advance HEP   Time 4   Status Partially Met   PT LONG TERM GOAL #2   Title PT to be able to  Increase strength to  come from sit to stand on one attempt     Time 4   Status Achieved   PT LONG TERM GOAL #3   Title Pt to be able to ambulate x 15 minutes with walker without stopping for better health habits     Time 4   Status Not Met   PT LONG TERM GOAL #4   Title Pt to increase core strength to be able to stand for five minutes to be able to complete self grooming activity.   Time 4  Status  Achieved   PT LONG TERM GOAL #5   Title Pt to be ambulating inside with a cane    Time 8   Period Weeks   Status New          Plan - 2014-03-04 06/11/1207    Clinical Impression Statement Pt has improved in functional mobility at home as well as improving in LE strength except for ankle.  Ankle ROM has improved however.  Pt is still not a maximal functional capacity and will benefit from skilled PT to work on obtaining goals that have not yet been achieved.     Pt will benefit from skilled therapeutic intervention in order to improve on the following deficits Decreased activity tolerance;Decreased balance;Difficulty walking;Improper body mechanics   Rehab Potential Good   PT Frequency 3x / week   PT Duration 4 weeks   PT Treatment/Interventions Manual techniques;Passive range of motion;Therapeutic activities;Therapeutic exercise;Balance training   PT Plan begin wall bumps to improve abdominal mm           G-Codes - 04-Mar-2014 1205-06-10    Functional Assessment Tool Used clinical judgement    Functional Limitation Mobility: Walking and moving around   Mobility: Walking and Moving Around Current Status [phone]) At least 40 percent but less than 60 percent impaired, limited or restricted   Mobility: Walking and Moving Around Goal Status [phone]) At least 40 percent but less than 60 percent impaired, limited or restricted      Problem List Patient Active Problem List   Diagnosis Date Noted  . Difficulty walking 01/03/2014  . Poor balance 01/03/2014  . Weakness of both legs 01/03/2014  . Acute respiratory failure with hypoxia 12/03/2013  . Pneumonia 12/02/2013  . HCAP (healthcare-associated pneumonia) 12/02/2013  . Acute on chronic diastolic heart failure 03/75/4360  . Hypokalemia 10/24/2013  . Diarrhea 10/24/2013  . Pulmonary edema, noncardiac 10/24/2013  . Appetite loss 10/24/2013  . Nausea with vomiting 10/24/2013  . Atrial fibrillation 08/23/2013  . History of CVA (cerebrovascular  accident) 08/19/2013  . History of duodenal ulcer 07/16/2013  . Type II or unspecified type diabetes mellitus with renal manifestations, uncontrolled 06/29/2013  . History of angioedema 08/30/2012  . Anemia 01/24/2012  . HLD (hyperlipidemia) 10/02/2008  . CAD, NATIVE VESSEL 10/02/2008  . Chronic diastolic heart failure 67/70/3403  . Essential hypertension 09/17/2008  . ESRD (end stage renal disease) 09/17/2008        Dev Dhondt,[name] PT Mar 04, 2014, 12:12 PM

## 2014-02-03 NOTE — Telephone Encounter (Signed)
[name] can you please make her a appointment

## 2014-02-03 NOTE — Telephone Encounter (Signed)
Patient has appointment on 03/08/14

## 2014-02-04 ENCOUNTER — Ambulatory Visit (HOSPITAL_COMMUNITY): Payer: Medicare Other | Admitting: Physical Therapy

## 2014-02-04 NOTE — Telephone Encounter (Signed)
Tried to call pt- NA 

## 2014-02-07 ENCOUNTER — Ambulatory Visit (HOSPITAL_COMMUNITY)
Admission: RE | Admit: 2014-02-07 | Discharge: 2014-02-07 | Disposition: A | Discharge status: Home / Self Care | Payer: Medicare Other | Source: Ambulatory Visit | Attending: Family Medicine | Admitting: Family Medicine

## 2014-02-07 DIAGNOSIS — R29898 Other symptoms and signs involving the musculoskeletal system: Secondary | ICD-10-CM

## 2014-02-07 DIAGNOSIS — R262 Difficulty in walking, not elsewhere classified: Secondary | ICD-10-CM

## 2014-02-07 DIAGNOSIS — R2689 Other abnormalities of gait and mobility: Secondary | ICD-10-CM

## 2014-02-07 DIAGNOSIS — Z5189 Encounter for other specified aftercare: Secondary | ICD-10-CM | POA: Diagnosis not present

## 2014-02-07 NOTE — Therapy (Signed)
Physical Therapy Treatment  Patient Details  Name: [name] MRN: [id number] Date of Birth: [birth year]  Encounter Date: 02/07/2014      PT End of Session - 02/07/14 1209    Visit Number 11   Number of Visits 20   Date for PT Re-Evaluation 03/02/14   Authorization Type medicare   Authorization Time Period gcode complete 10th session   Authorization - Visit Number 11   Authorization - Number of Visits 20   PT Start Time 1102   PT Stop Time 1143   PT Time Calculation (min) 41 min   PT Charge Details TE [phone]   Equipment Utilized During Treatment Gait belt   Activity Tolerance Patient limited by fatigue;Patient tolerated treatment well   Behavior During Therapy [facility] for tasks assessed/performed      Past Medical History  Diagnosis Date  . Diabetes mellitus   . Hypertension   . Hyperlipidemia   . Coronary artery disease   . Carpal tunnel syndrome   . Anemia   . Colon polyps 01/2012    Per colonoscopy, Dr. Gala Romney  . Transfusion history 03-04-13    [facility]fusion x1 unit a month ago-[facility]  . Stroke 2010    2010-"brief weakness,tongue thickness,incoherent"-no residual affects.  . Mitral regurgitation   . Myocardial infarction 2009  . Peritonitis 06/23/2013  . Ovarian cancer 1995    [facility] [name]  . ESRD (end stage renal disease)     Started dialysis in 2009 with hemodialysis and did HD for about 5 years using a R arm AVF.  Changed to PD due to pt likes to travel and is independent.  Her nephrologist is Dr Hinda Lenis in [place], [place].       Past Surgical History  Procedure Laterality Date  . Complete abdominal hysterectomy  1995  . Abdominal hysterectomy    . Colonoscopy with esophagogastroduodenoscopy (egd)  01/27/2012    Dr. Gala Romney. Small hiatal hernia, abnormal second portion of the duodenum with questionable extrinsic compression. Single left sided colonic diverticulum, 2 colon polyps. Hamartomatous colon polyp  . Eus  04/09/2012    Dr. Owens Loffler: Nonspecific edema/thickening of the periampullary duodenum. Unremarkable biopsy  . Tonsillectomy      child  . Peritoneal catheter insertion  10/30/2012    for peritoneal dialysis  . Back surgery  2009    x3 level fusion-Dr. Louanne Skye  . Cardiac catheterization      x2 stents-'09- Dr. Joellen Jersey  . Esophagogastroduodenoscopy (egd) with propofol N/A 03/11/2013    Dr. Owens Loffler, small duodenal diverticulum. Edematous duodenal mucosa with friability on biopsy (pathology negative)  . Three-vessel cabg, mitral valve annuloplasty, may ease  06/17/2013    [facility]  . Coronary artery bypass graft    . Esophagogastroduodenoscopy N/A 07/02/2013    SLF:GI BLEED DUE TO LARGE DUODENAL ULCER/Small hiatal hernia/PYLORIC CHANNEL ULCER/Multiple CLEANED BASED ulcers in the duodenal bulb and 2nd part of the duodenum  . Givens capsule study N/A 07/02/2013    Procedure: GIVENS CAPSULE STUDY;  Surgeon: Danie Binder, MD;  [facility];  Service: Endoscopy;  Laterality: N/A;  possible deployment in EGD negative  . Radiology with anesthesia N/A 08/19/2013    Procedure: RADIOLOGY WITH ANESTHESIA;  Surgeon: Rob Hickman, MD;  Location: [facility];  Service: Radiology;  Laterality: N/A;    There were no vitals taken for this visit.  Visit Diagnosis:  Difficulty walking  Poor balance  Weakness of both legs      Subjective  Assessment - 02/07/14 1105    Symptoms  Pt stated she is not feeling good today, no real pain.  Stated she is tired today.  Reported blood sugar low today, hasn't ate anything yet.   Currently in Pain? No/denies            [facility] Adult PT Treatment/Exercise - 02/07/14 0001    Knee/Hip Exercises: Standing   Functional Squat 10 reps   Functional Squat Limitations no HHA   Other Standing Knee Exercises sit to stand x 10 ;     Knee/Hip Exercises: Seated   Long Arc Quad 10 reps   Long Arc Quad Weight 5 lbs.   Other Seated Knee Exercises cervical retraction  x  10; scapula retraction 10x   Other Seated Knee Exercises Diagraphmatic breathing multiple sets through session to increase O2 sat   Balance Exercises: Standing   Wall Bumps Shoulder  shoulder and hips 10 reps with min assistance            PT Short Term Goals - 02/07/14 1308    PT SHORT TERM GOAL #1   Title Pt/caregiver will Perform Home Exercise Program: For increased strengthening   Status Achieved   PT SHORT TERM GOAL #2   Title Pt to be able to verbalize the importance of diapharagmic breathing     Status Achieved   PT SHORT TERM GOAL #3   Title Pt to be able to ambulate with walker for five minutes without stopping for improved safety     Status Achieved   PT SHORT TERM GOAL #4   Title Pt to increase core strength to be able to stand for three minutes to be able to brush teeth at bathroom counter    Status Achieved   PT SHORT TERM GOAL #5   Title Pt to improve balance to  be able to SLS for five seconds to reduce risk of fall     Status On-going          PT Long Term Goals - 02/07/14 1309    PT LONG TERM GOAL #1   Title Independent in advance HEP   PT LONG TERM GOAL #2   Title PT to be able to  Increase strength to  come from sit to stand on one attempt     Status Achieved   PT LONG TERM GOAL #3   Title Pt to be able to ambulate x 15 minutes with walker without stopping for better health habits     Status On-going   PT LONG TERM GOAL #4   Title Pt to increase core strength to be able to stand for five minutes to be able to complete self grooming activity.   Status Achieved   PT LONG TERM GOAL #5   Title Pt to be ambulating inside with a cane           Plan - 02/07/14 1218    Clinical Impression Statement Began wall bumps to improve core strengthening with balance activites.  Pt continues to demonstrate posterior lean with standing exercises.  Therapist facilitation to improve form and weight distribution with standing exercises.  Pt limited by fatigue through  session.  O2 sats range from 86-96 with therapist facilitation to improve diaphragmatic breathing techniques to increase O2 sats.   PT Next Visit Plan Continue focus on improving cervical extension, retraction with posture, O2 sats and balance to improve functional stregntening exercises and reduce risk of falls.   PT Plan Continue PT POC.  Problem List Patient Active Problem List   Diagnosis Date Noted  . Difficulty walking 01/03/2014  . Poor balance 01/03/2014  . Weakness of both legs 01/03/2014  . Acute respiratory failure with hypoxia 12/03/2013  . Pneumonia 12/02/2013  . HCAP (healthcare-associated pneumonia) 12/02/2013  . Acute on chronic diastolic heart failure 16/12/9602  . Hypokalemia 10/24/2013  . Diarrhea 10/24/2013  . Pulmonary edema, noncardiac 10/24/2013  . Appetite loss 10/24/2013  . Nausea with vomiting 10/24/2013  . Atrial fibrillation 08/23/2013  . History of CVA (cerebrovascular accident) 08/19/2013  . History of duodenal ulcer 07/16/2013  . Type II or unspecified type diabetes mellitus with renal manifestations, uncontrolled 06/29/2013  . History of angioedema 08/30/2012  . Anemia 01/24/2012  . HLD (hyperlipidemia) 10/02/2008  . CAD, NATIVE VESSEL 10/02/2008  . Chronic diastolic heart failure 54/11/8117  . Essential hypertension 09/17/2008  . ESRD (end stage renal disease) 09/17/2008   Aldona Lento, PTA Aldona Lento 02/07/2014, 1:10 PM

## 2014-02-09 ENCOUNTER — Ambulatory Visit (HOSPITAL_COMMUNITY)
Admission: RE | Admit: 2014-02-09 | Discharge: 2014-02-09 | Disposition: A | Discharge status: Home / Self Care | Payer: Medicare Other | Source: Ambulatory Visit | Attending: Family Medicine | Admitting: Family Medicine

## 2014-02-09 DIAGNOSIS — R29898 Other symptoms and signs involving the musculoskeletal system: Secondary | ICD-10-CM

## 2014-02-09 DIAGNOSIS — R2689 Other abnormalities of gait and mobility: Secondary | ICD-10-CM

## 2014-02-09 DIAGNOSIS — Z5189 Encounter for other specified aftercare: Secondary | ICD-10-CM | POA: Diagnosis not present

## 2014-02-09 DIAGNOSIS — R262 Difficulty in walking, not elsewhere classified: Secondary | ICD-10-CM

## 2014-02-09 NOTE — Addendum Note (Signed)
Encounter addended by: Leeroy Cha, PT on: 02/09/2014  2:50 PM<BR>     Documentation filed: Clinical Notes

## 2014-02-09 NOTE — Therapy (Signed)
Physical Therapy Treatment  Patient Details  Name: [name] MRN: [id number] Date of Birth: 10/28/1945  Encounter Date: 02/09/2014      PT End of Session - 02/09/14 1158    Visit Number 12   Number of Visits 20   Date for PT Re-Evaluation 03/02/14   Authorization Type medicare   Authorization Time Period gcode complete 10th session   Authorization - Visit Number 12   Authorization - Number of Visits 20   PT Start Time 4098   PT Stop Time 1151   PT Time Calculation (min) 48 min   Equipment Utilized During Treatment Gait belt   Activity Tolerance Patient limited by fatigue      Past Medical History  Diagnosis Date  . Diabetes mellitus   . Hypertension   . Hyperlipidemia   . Coronary artery disease   . Carpal tunnel syndrome   . Anemia   . Colon polyps 01/2012    Per colonoscopy, Dr. Gala Romney  . Transfusion history 03-04-13    Transfusion x1 unit a month ago-[facility]  . Stroke 2010    2010-"brief weakness,tongue thickness,incoherent"-no residual affects.  . Mitral regurgitation   . Myocardial infarction 2009  . Peritonitis 06/23/2013  . Ovarian cancer 1995    [name]  . ESRD (end stage renal disease)     Started dialysis in 2009 with hemodialysis and did HD for about 5 years using a R arm AVF.  Changed to PD due to pt likes to travel and is independent.  Her nephrologist is Dr Hinda Lenis in [place], [place].       Past Surgical History  Procedure Laterality Date  . Complete abdominal hysterectomy  1995  . Abdominal hysterectomy    . Colonoscopy with esophagogastroduodenoscopy (egd)  01/27/2012    Dr. Gala Romney. Small hiatal hernia, abnormal second portion of the duodenum with questionable extrinsic compression. Single left sided colonic diverticulum, 2 colon polyps. Hamartomatous colon polyp  . Eus  04/09/2012    Dr. Owens Loffler: Nonspecific edema/thickening of the periampullary duodenum. Unremarkable biopsy  . Tonsillectomy      child  . Peritoneal  catheter insertion  10/30/2012    for peritoneal dialysis  . Back surgery  2009    x3 level fusion-Dr. Louanne Skye  . Cardiac catheterization      x2 stents-'09- Dr. Joellen Jersey  . Esophagogastroduodenoscopy (egd) with propofol N/A 03/11/2013    Dr. Owens Loffler, small duodenal diverticulum. Edematous duodenal mucosa with friability on biopsy (pathology negative)  . Three-vessel cabg, mitral valve annuloplasty, may ease  06/17/2013    [facility]  . Coronary artery bypass graft    . Esophagogastroduodenoscopy N/A 07/02/2013    SLF:GI BLEED DUE TO LARGE DUODENAL ULCER/Small hiatal hernia/PYLORIC CHANNEL ULCER/Multiple CLEANED BASED ulcers in the duodenal bulb and 2nd part of the duodenum  . Givens capsule study N/A 07/02/2013    Procedure: GIVENS CAPSULE STUDY;  Surgeon: Danie Binder, MD;  [facility];  Service: Endoscopy;  Laterality: N/A;  possible deployment in EGD negative  . Radiology with anesthesia N/A 08/19/2013    Procedure: RADIOLOGY WITH ANESTHESIA;  Surgeon: Rob Hickman, MD;  Location: [facility];  Service: Radiology;  Laterality: N/A;    There were no vitals taken for this visit.  Visit Diagnosis:  Difficulty walking  Poor balance  Weakness of both legs      Subjective Assessment - 02/09/14 1103    Symptoms Pt states that her stomach started bothering her last night and  she is still having quite a bit of pain.              [facility] Adult PT Treatment/Exercise - 02/09/14 1134    Knee/Hip Exercises: Stretches   Gastroc Stretch 3 reps;30 seconds   Knee/Hip Exercises: Standing   Heel Raises 10 reps   Heel Raises Limitations lifting wt ball    Functional Squat 10 reps   Functional Squat Limitations lifting weighted ball   Other Standing Knee Exercises tandem stance x 3/ marching x 10   Other Standing Knee Exercises sit to stand x 10 ;     Knee/Hip Exercises: Seated   Long Arc Quad 10 reps   Long Arc Quad Weight 5 lbs.   Other Seated Knee Exercises  cervical retraction  with  scapula retraction 10x   Other Seated Knee Exercises Diagraphmatic breathing multiple sets through session to increase O2 sat            PT Short Term Goals - 02/09/14 1207    PT SHORT TERM GOAL #1   Title Pt/caregiver will Perform Home Exercise Program: For increased strengthening   Time 2   Period Weeks   Status Achieved   PT SHORT TERM GOAL #2   Title Pt to be able to verbalize the importance of diapharagmic breathing     Time 2   Period Weeks   Status Achieved   PT SHORT TERM GOAL #3   Title Pt to be able to ambulate with walker for five minutes without stopping for improved safety     Time 2   Period Weeks   Status Achieved   PT SHORT TERM GOAL #4   Title Pt to increase core strength to be able to stand for three minutes to be able to brush teeth at bathroom counter    Time 2   Period Weeks   Status Achieved   PT SHORT TERM GOAL #5   Title Pt to improve balance to  be able to SLS for five seconds to reduce risk of fall     Period Weeks   Status On-going          PT Long Term Goals - 02/09/14 1207    PT LONG TERM GOAL #1   Title Independent in advance HEP   Time 4   Period Weeks   Status Partially Met   PT LONG TERM GOAL #2   Title PT to be able to  Increase strength to  come from sit to stand on one attempt     Time 4   Period Weeks   PT LONG TERM GOAL #3   Title Pt to be able to ambulate x 15 minutes with walker without stopping for better health habits     Time 4   Period Weeks   Status On-going   PT LONG TERM GOAL #4   Title Pt to increase core strength to be able to stand for five minutes to be able to complete self grooming activity.   Time 4   Period Weeks   Status Achieved   PT LONG TERM GOAL #5   Title Pt to be ambulating inside with a cane    Time 8   Period Weeks   Status On-going          Plan - 02/09/14 1201    Clinical Impression Statement Pt continues to see improvement states she is able to come sit  to stand without using her hands.  Pt began  side stepping to work on glut medius as well as balance.  Pt continues to need therapist facilitation for balance.     Pt will benefit from skilled therapeutic intervention in order to improve on the following deficits Decreased activity tolerance;Decreased balance;Difficulty walking;Improper body mechanics   PT Next Visit Plan Continue focus on improving cervical extension, retraction with posture, O2 sats and balance to improve functional stregntening exercises and reduce risk of falls.        Problem List Patient Active Problem List   Diagnosis Date Noted  . Difficulty walking 01/03/2014  . Poor balance 01/03/2014  . Weakness of both legs 01/03/2014  . Acute respiratory failure with hypoxia 12/03/2013  . Pneumonia 12/02/2013  . HCAP (healthcare-associated pneumonia) 12/02/2013  . Acute on chronic diastolic heart failure 54/56/2563  . Hypokalemia 10/24/2013  . Diarrhea 10/24/2013  . Pulmonary edema, noncardiac 10/24/2013  . Appetite loss 10/24/2013  . Nausea with vomiting 10/24/2013  . Atrial fibrillation 08/23/2013  . History of CVA (cerebrovascular accident) 08/19/2013  . History of duodenal ulcer 07/16/2013  . Type II or unspecified type diabetes mellitus with renal manifestations, uncontrolled 06/29/2013  . History of angioedema 08/30/2012  . Anemia 01/24/2012  . HLD (hyperlipidemia) 10/02/2008  . CAD, NATIVE VESSEL 10/02/2008  . Chronic diastolic heart failure 89/37/3428  . Essential hypertension 09/17/2008  . ESRD (end stage renal disease) 09/17/2008      Azucena Freed PT/CLT [phone]  02/09/2014, 12:10 PM

## 2014-02-09 NOTE — Addendum Note (Signed)
Encounter addended by: Leeroy Cha, PT on: 02/09/2014  2:27 PM<BR>     Documentation filed: Clinical Notes

## 2014-02-10 NOTE — Telephone Encounter (Signed)
Pt is aware.  

## 2014-02-11 ENCOUNTER — Ambulatory Visit (HOSPITAL_COMMUNITY)
Admission: RE | Admit: 2014-02-11 | Discharge: 2014-02-11 | Disposition: A | Discharge status: Home / Self Care | Payer: Medicare Other | Source: Ambulatory Visit | Attending: Family Medicine | Admitting: Family Medicine

## 2014-02-11 DIAGNOSIS — R2689 Other abnormalities of gait and mobility: Secondary | ICD-10-CM

## 2014-02-11 DIAGNOSIS — R29898 Other symptoms and signs involving the musculoskeletal system: Secondary | ICD-10-CM

## 2014-02-11 DIAGNOSIS — R262 Difficulty in walking, not elsewhere classified: Secondary | ICD-10-CM

## 2014-02-11 DIAGNOSIS — Z5189 Encounter for other specified aftercare: Secondary | ICD-10-CM | POA: Diagnosis not present

## 2014-02-11 NOTE — Therapy (Signed)
Physical Therapy Treatment  Patient Details  Name: [name] MRN: [id number] Date of Birth: 10-05-1945  Encounter Date: 02/11/2014      PT End of Session - 02/11/14 1128    Visit Number 13   Number of Visits 20   Date for PT Re-Evaluation 03/02/14   Authorization Type medicare   Authorization - Visit Number 13   Authorization - Number of Visits 20   PT Start Time 4098   PT Stop Time 1125   PT Time Calculation (min) 20 min   Equipment Utilized During Treatment Gait belt   Activity Tolerance Patient limited by fatigue;Other (comment)      Past Medical History  Diagnosis Date  . Diabetes mellitus   . Hypertension   . Hyperlipidemia   . Coronary artery disease   . Carpal tunnel syndrome   . Anemia   . Colon polyps 01/2012    Per colonoscopy, Dr. Gala Romney  . Transfusion history 03-04-13    [facility]fusion [facility]1 unit a month ago-[facility]  . Stroke 2010    2010-"brief weakness,tongue thickness,incoherent"-no residual affects.  . Mitral regurgitation   . Myocardial infarction 2009  . Peritonitis 06/23/2013  . Ovarian cancer 1995    [name]  . ESRD (end stage renal disease)     Started dialysis in 2009 with hemodialysis and did HD for about 5 years using a R arm AVF.  Changed to PD due to pt likes to travel and is independent.  Her nephrologist is Dr Hinda Lenis in [place], [place].       Past Surgical History  Procedure Laterality Date  . Complete abdominal hysterectomy  1995  . Abdominal hysterectomy    . Colonoscopy with esophagogastroduodenoscopy (egd)  01/27/2012    Dr. Gala Romney. Small hiatal hernia, abnormal second portion of the duodenum with questionable extrinsic compression. Single left sided colonic diverticulum, 2 colon polyps. Hamartomatous colon polyp  . Eus  04/09/2012    Dr. Owens Loffler: Nonspecific edema/thickening of the periampullary duodenum. Unremarkable biopsy  . Tonsillectomy      child  . Peritoneal catheter insertion  10/30/2012    for  peritoneal dialysis  . Back surgery  2009    x3 level fusion-Dr. Louanne Skye  . Cardiac catheterization      x2 stents-'09- Dr. Joellen Jersey  . Esophagogastroduodenoscopy (egd) with propofol N/A 03/11/2013    Dr. Owens Loffler, small duodenal diverticulum. Edematous duodenal mucosa with friability on biopsy (pathology negative)  . Three-vessel cabg, mitral valve annuloplasty, may ease  06/17/2013    [facility]  . Coronary artery bypass graft    . Esophagogastroduodenoscopy N/A 07/02/2013    SLF:GI BLEED DUE TO LARGE DUODENAL ULCER/Small hiatal hernia/PYLORIC CHANNEL ULCER/Multiple CLEANED BASED ulcers in the duodenal bulb and 2nd part of the duodenum  . Givens capsule study N/A 07/02/2013    Procedure: GIVENS CAPSULE STUDY;  Surgeon: Danie Binder, MD;  [facility];  Service: Endoscopy;  Laterality: N/A;  possible deployment in EGD negative  . Radiology with anesthesia N/A 08/19/2013    Procedure: RADIOLOGY WITH ANESTHESIA;  Surgeon: Rob Hickman, MD;  Location: [facility];  Service: Radiology;  Laterality: N/A;    There were no vitals taken for this visit.  Visit Diagnosis:  Difficulty walking  Poor balance  Weakness of both legs      Subjective Assessment - 02/11/14 1115    Symptoms Pt states that her sugar has been dropping so she just feels bad    Currently in Pain?  No/denies            [facility] Adult PT Treatment/Exercise - 02/11/14 1126    Knee/Hip Exercises: Stretches   Gastroc Stretch 3 reps;30 seconds   Knee/Hip Exercises: Standing   Other Standing Knee Exercises tandem gt x 2 RT; retro gt x 1    Knee/Hip Exercises: Seated   Other Seated Knee Exercises scapular retraction x 10; diaphragmic breathing             PT Short Term Goals - 02/11/14 1132    PT SHORT TERM GOAL #1   Title Pt/caregiver will Perform Home Exercise Program: For increased strengthening   Time 2   Period Weeks   Status Achieved   PT SHORT TERM GOAL #2   Title Pt to be able to  verbalize the importance of diapharagmic breathing     Time 2   Period Weeks   Status Achieved   PT SHORT TERM GOAL #3   Title Pt to be able to ambulate with walker for five minutes without stopping for improved safety     Time 2   Period Weeks   Status Achieved   PT SHORT TERM GOAL #4   Title Pt to increase core strength to be able to stand for three minutes to be able to brush teeth at bathroom counter    Time 2   Period Weeks   Status Achieved   PT SHORT TERM GOAL #5   Title Pt to improve balance to  be able to SLS for five seconds to reduce risk of fall     Time 2   Period Weeks   Status On-going            Plan - 02/11/14 1129    Clinical Impression Statement Pt states that her blood sugar has dropped twice today,( pt has not ate since lunch yesterday), pt refuses crackers. Pt unable to work more than a few minutes without needing to rest stating that she just does not feel well.  Therapist did not feel comfortable continuing treatment therefore treatment was discontinued and pt was instructed to go get something to eat.     PT Next Visit Plan Continue focus on improving cervical extension, retraction with posture, O2 sats and balance to improve functional stregntening exercises and reduce risk of falls.        Problem List Patient Active Problem List   Diagnosis Date Noted  . Difficulty walking 01/03/2014  . Poor balance 01/03/2014  . Weakness of both legs 01/03/2014  . Acute respiratory failure with hypoxia 12/03/2013  . Pneumonia 12/02/2013  . HCAP (healthcare-associated pneumonia) 12/02/2013  . Acute on chronic diastolic heart failure 00/86/7619  . Hypokalemia 10/24/2013  . Diarrhea 10/24/2013  . Pulmonary edema, noncardiac 10/24/2013  . Appetite loss 10/24/2013  . Nausea with vomiting 10/24/2013  . Atrial fibrillation 08/23/2013  . History of CVA (cerebrovascular accident) 08/19/2013  . History of duodenal ulcer 07/16/2013  . Type II or unspecified type  diabetes mellitus with renal manifestations, uncontrolled 06/29/2013  . History of angioedema 08/30/2012  . Anemia 01/24/2012  . HLD (hyperlipidemia) 10/02/2008  . CAD, NATIVE VESSEL 10/02/2008  . Chronic diastolic heart failure 50/93/2671  . Essential hypertension 09/17/2008  . ESRD (end stage renal disease) 09/17/2008       Azucena Freed PT/CLT [phone]    02/11/2014, 11:33 AM

## 2014-02-14 ENCOUNTER — Encounter: Payer: Self-pay | Admitting: Cardiovascular Disease

## 2014-02-14 ENCOUNTER — Ambulatory Visit (INDEPENDENT_AMBULATORY_CARE_PROVIDER_SITE_OTHER): Payer: Medicare Other | Admitting: Cardiovascular Disease

## 2014-02-14 ENCOUNTER — Telehealth: Payer: Self-pay | Admitting: Internal Medicine

## 2014-02-14 ENCOUNTER — Ambulatory Visit: Payer: Medicare Other | Admitting: Gastroenterology

## 2014-02-14 VITALS — BP 120/70 | HR 81 | Wt 148.0 lb

## 2014-02-14 DIAGNOSIS — I2510 Atherosclerotic heart disease of native coronary artery without angina pectoris: Secondary | ICD-10-CM

## 2014-02-14 DIAGNOSIS — I5032 Chronic diastolic (congestive) heart failure: Secondary | ICD-10-CM

## 2014-02-14 DIAGNOSIS — I4891 Unspecified atrial fibrillation: Secondary | ICD-10-CM

## 2014-02-14 DIAGNOSIS — I10 Essential (primary) hypertension: Secondary | ICD-10-CM

## 2014-02-14 DIAGNOSIS — I25810 Atherosclerosis of coronary artery bypass graft(s) without angina pectoris: Secondary | ICD-10-CM

## 2014-02-14 DIAGNOSIS — N186 End stage renal disease: Secondary | ICD-10-CM

## 2014-02-14 DIAGNOSIS — E785 Hyperlipidemia, unspecified: Secondary | ICD-10-CM

## 2014-02-14 MED ORDER — ASPIRIN EC 81 MG PO TBEC: 81.0000 mg | DELAYED_RELEASE_TABLET | Freq: Every day | ORAL | Status: AC

## 2014-02-14 MED ORDER — SIMVASTATIN 40 MG PO TABS: 40.0000 mg | ORAL_TABLET | Freq: Every day | ORAL | Status: DC

## 2014-02-14 NOTE — Progress Notes (Signed)
History of Present Illness: 68 yo female with a history of the ESRD on hemodialysis, CAD s/p CABG March 2015 at [facility], prior TIA, HTN, HLD, DM here today for cardiac follow up. She had been previously followed by Dr. Olevia Perches and I met her for the first time in April 2013. I have not see her since then. SShe established with Dr. Angelena Form in 06/2011. She has not been seen since that time. She had undergone PCI of the Circumflex in 2008 with DES, known occlusion of LAD. She was ultimately seen at [facility] and underwent CABG 3 (SVG-OM, SVG-D1, LIMA-LAD), mitral valve repair, maze procedure and excision of the left atrial appendage in 05/2013 with Dr. Lunette Stands.She was admitted for upper GI bleed secondary to duodenal ulcer in 06/2013. She was admitted for left MCA CVA and 08/2013. Notes indicate that atrial fibrillation was documented on admission. However, the patient was not placed on Coumadin or antiplatelet therapy given her GI bleed. Apparently, her gastroenterologist has recommended that she not take anticoagulation therapy until cleared with a follow-up EGD.She was admitted to [facility] 11/2013 with acute hypoxic respiratory failure in the setting of acute on chronic diastolic heart failure, right-sided pleural effusion and HCAP. Patient underwent dialysis for 5 days in a row with improvement in her volume status. She was seen here by Richardson Dopp, PA-C on 01/24/14 for surgical clearance for peritoneal dialysis catheter removal. Echo May 2015 with Moderate LVH, LVEF 55-60%, normal wall motion, mitral valve repair okay, mild mitral stenosis (mean 8 mmHg), mild to moderate LAE. She is still trying to schedule her dialysis catheter removal on March 08, 2014.   She is here today for follow up. No chest pain or SOB. She feels well. No LE edema.   Primary care: Azalia Bilis [facility])  Last Lipid Profile:Lipid Panel     Component Value Date/Time   CHOL 109 08/19/2013 0554   TRIG  79 08/19/2013 0554   HDL 51 08/19/2013 0554   CHOLHDL 2.1 08/19/2013 0554   VLDL 16 08/19/2013 0554   LDLCALC 42 08/19/2013 0554     Past Medical History  Diagnosis Date  . Diabetes mellitus   . Hypertension   . Hyperlipidemia   . Coronary artery disease   . Carpal tunnel syndrome   . Anemia   . Colon polyps 01/2012    Per colonoscopy, Dr. Gala Romney  . Transfusion history 03-04-13    [facility]1 unit a month ago-[facility]  . Stroke 2010    2010-"brief weakness,tongue thickness,incoherent"-no residual affects.  . Mitral regurgitation   . Myocardial infarction 2009  . Peritonitis 06/23/2013  . Ovarian cancer 1995    [name]  . ESRD (end stage renal disease)     Started dialysis in 2009 with hemodialysis and did HD for about 5 years using a R arm AVF.  Changed to PD due to pt likes to travel and is independent.  Her nephrologist is Dr Hinda Lenis in [place], [place].       Past Surgical History  Procedure Laterality Date  . Complete abdominal hysterectomy  1995  . Abdominal hysterectomy    . Colonoscopy with esophagogastroduodenoscopy (egd)  01/27/2012    Dr. Gala Romney. Small hiatal hernia, abnormal second portion of the duodenum with questionable extrinsic compression. Single left sided colonic diverticulum, 2 colon polyps. Hamartomatous colon polyp  . Eus  04/09/2012    Dr. Owens Loffler: Nonspecific edema/thickening of the periampullary duodenum. Unremarkable biopsy  . Tonsillectomy  child  . Peritoneal catheter insertion  10/30/2012    for peritoneal dialysis  . Back surgery  2009    x3 level fusion-Dr. Louanne Skye  . Cardiac catheterization      x2 stents-'09- Dr. Joellen Jersey  . Esophagogastroduodenoscopy (egd) with propofol N/A 03/11/2013    Dr. Owens Loffler, small duodenal diverticulum. Edematous duodenal mucosa with friability on biopsy (pathology negative)  . Three-vessel cabg, mitral valve annuloplasty, may ease  06/17/2013    [facility]  . Coronary  artery bypass graft    . Esophagogastroduodenoscopy N/A 07/02/2013    SLF:GI BLEED DUE TO LARGE DUODENAL ULCER/Small hiatal hernia/PYLORIC CHANNEL ULCER/Multiple CLEANED BASED ulcers in the duodenal bulb and 2nd part of the duodenum  . Givens capsule study N/A 07/02/2013    Procedure: GIVENS CAPSULE STUDY;  Surgeon: Danie Binder, MD;  [facility];  Service: Endoscopy;  Laterality: N/A;  possible deployment in EGD negative  . Radiology with anesthesia N/A 08/19/2013    Procedure: RADIOLOGY WITH ANESTHESIA;  Surgeon: Rob Hickman, MD;  Location: [place];  Service: Radiology;  Laterality: N/A;    Current Outpatient Prescriptions  Medication Sig Dispense Refill  . ALPRAZolam (XANAX) 0.5 MG tablet Take 0.5 mg by mouth at bedtime as needed for anxiety.    . calcitRIOL (ROCALTROL) 0.5 MCG capsule Take 0.5 mcg by mouth daily.    . carvedilol (COREG) 3.125 MG tablet Take 1.5625 mg by mouth 2 (two) times daily with a meal. Take 1/2 tab in the morning and 1/2 in the evening.    Marland Kitchen EPINEPHrine 0.3 mg/0.3 mL IJ SOAJ injection Inject 0.3 mg into the muscle daily as needed (allergic reaction).    . insulin NPH-regular Human (HUMULIN 70/30) (70-30) 100 UNIT/ML injection Inject 10-20 Units into the skin 2 (two) times daily with a meal.    . meclizine (ANTIVERT) 25 MG tablet Take 12.5 mg by mouth 2 (two) times daily as needed for dizziness (Vertigo).     . multivitamin (RENA-VIT) TABS tablet Take 1 tablet by mouth every morning.    . pantoprazole (PROTONIX) 40 MG tablet Take 1 tablet (40 mg total) by mouth 2 (two) times daily. 180 tablet 3   No current facility-administered medications for this visit.    Allergies  Allergen Reactions  . Lisinopril Swelling  . Ezetimibe Swelling    ZETIA: Tongue swelling    History   Social History  . Marital Status: Married    Spouse Name: Sabra Heck    Number of Children: 3  . Years of Education: 10th   Occupational History  . RETIRED    Social  History Main Topics  . Smoking status: Former Smoker -- 0.50 packs/day for 15 years    Quit date: 03/25/1958  . Smokeless tobacco: Never Used  . Alcohol Use: No  . Drug Use: No  . Sexual Activity: No   Other Topics Concern  . Not on file   Social History Narrative   3 grown healthy children   Lives w/ husband   Has 8 grandkids   Caffeine use; occasionally    Family History  Problem Relation Age of Onset  . Diabetes Mother   . Heart attack Neg Hx   . Stroke Mother     Review of Systems:  As stated in the HPI and otherwise negative.   BP 120/70 mmHg  Pulse 81  Wt 148 lb (67.132 kg)  SpO2 93%  Physical Examination: General: Well developed, well nourished, NAD HEENT: OP clear,  mucus membranes moist SKIN: warm, dry. No rashes. Neuro: No focal deficits Musculoskeletal: Muscle strength 5/5 all ext Psychiatric: Mood and affect normal Neck: No JVD, no carotid bruits, no thyromegaly, no lymphadenopathy. Lungs:Clear bilaterally, no wheezes, rhonci, crackles Cardiovascular: Regular rate and rhythm. No murmurs, gallops or rubs. Abdomen:Soft. Bowel sounds present. Non-tender.  Extremities: No lower extremity edema. Pulses are 2 + in the bilateral DP/PT.  EKG:  Assessment and Plan:   1. CAD: No angina. Continue beta blocker. Resume ASA 81 mg daily. Will restart simvastatin 40 mg daily.   2. HLD: Will simvastatin as above. Repeat lipids and LFTs in 12 weeks.   3. Atrial fibrillation, paroxysmal: No clear documentation of this. She had a Maze procedure and left atrial appendage excision this year.  The likelihood that she would have CVA from atrial fibrillation is quite low. Will not start coumadin.   4. Chronic diastolic heart failure: Volume management per dialysis.  5. Essential hypertension: BP controlled. No changes.   6. ESRD:  She remains on HD. (Tuesday, Thursday, Saturday)

## 2014-02-14 NOTE — Telephone Encounter (Signed)
Tried to call pts home number- NA, no voicemail, I called the cell number listed and got the voicemail. I left a message that we had a cancellation at 2pm today with [facility] and that we could put her in that spot. Informed Manuela Schwartz as well.

## 2014-02-14 NOTE — Telephone Encounter (Signed)
Patient's husband called to say that she is having bad stomach pains and has OV for 12/15 at 10 with LSL, but needed something called into [facility] to settle her stomach. I asked if she needed something for nausea, but he said she needed something for pain not nausea. Please advise [phone] or [phone]

## 2014-02-14 NOTE — Telephone Encounter (Signed)
I put patient on today's schedule to come in at 2. Patient did not show up. Please advise another date and time we can see patient.

## 2014-02-14 NOTE — Patient Instructions (Signed)
Your physician wants you to follow-up in:  6 months. You will receive a reminder letter in the mail two months in advance. If you don't receive a letter, please call our office to schedule the follow-up appointment.  Your physician has recommended you make the following change in your medication:  Start enteric coated aspirin 81 mg by mouth daily. Start simvastatin 40 mg by mouth daily at bedtime.   Your physician recommends that you return for fasting lab work in:  12 weeks. --Scheduled for May 11, 2014.  The lab opens at 7:30 AM

## 2014-02-15 ENCOUNTER — Ambulatory Visit: Payer: Medicare Other | Admitting: Gastroenterology

## 2014-02-16 ENCOUNTER — Ambulatory Visit (INDEPENDENT_AMBULATORY_CARE_PROVIDER_SITE_OTHER): Payer: Medicare Other | Admitting: Gastroenterology

## 2014-02-16 ENCOUNTER — Ambulatory Visit (HOSPITAL_COMMUNITY)
Admission: RE | Admit: 2014-02-16 | Discharge: 2014-02-16 | Disposition: A | Discharge status: Home / Self Care | Payer: Medicare Other | Source: Ambulatory Visit | Attending: Family Medicine | Admitting: Family Medicine

## 2014-02-16 ENCOUNTER — Other Ambulatory Visit: Payer: Self-pay

## 2014-02-16 ENCOUNTER — Encounter: Payer: Self-pay | Admitting: Gastroenterology

## 2014-02-16 VITALS — BP 128/56 | HR 69 | Temp 97.0°F | Ht 65.0 in | Wt 135.0 lb

## 2014-02-16 DIAGNOSIS — R29898 Other symptoms and signs involving the musculoskeletal system: Secondary | ICD-10-CM

## 2014-02-16 DIAGNOSIS — R262 Difficulty in walking, not elsewhere classified: Secondary | ICD-10-CM

## 2014-02-16 DIAGNOSIS — K760 Fatty (change of) liver, not elsewhere classified: Secondary | ICD-10-CM

## 2014-02-16 DIAGNOSIS — R2689 Other abnormalities of gait and mobility: Secondary | ICD-10-CM

## 2014-02-16 DIAGNOSIS — I2510 Atherosclerotic heart disease of native coronary artery without angina pectoris: Secondary | ICD-10-CM

## 2014-02-16 DIAGNOSIS — Z5189 Encounter for other specified aftercare: Secondary | ICD-10-CM | POA: Diagnosis not present

## 2014-02-16 DIAGNOSIS — K279 Peptic ulcer, site unspecified, unspecified as acute or chronic, without hemorrhage or perforation: Secondary | ICD-10-CM

## 2014-02-16 NOTE — Therapy (Signed)
Physical Therapy Treatment  Patient Details  Name: [name] MRN: [id number] Date of Birth: [birth year]  Encounter Date: 02/16/2014      PT End of Session - 02/16/14 1354    Visit Number 14   Number of Visits 20   Date for PT Re-Evaluation 03/02/14   Authorization Type medicare   Authorization - Visit Number 14   Authorization - Number of Visits 20   PT Start Time 6440   PT Stop Time 1346   PT Time Calculation (min) 43 min   Equipment Utilized During Treatment Gait belt   Activity Tolerance Patient limited by fatigue;Other (comment)      Past Medical History  Diagnosis Date  . Diabetes mellitus   . Hypertension   . Hyperlipidemia   . Coronary artery disease   . Carpal tunnel syndrome   . Anemia   . Colon polyps 01/2012    Per colonoscopy, Dr. Gala Romney  . Transfusion history 03-04-13    Transfusion x1 unit a month ago-[facility]  . Stroke 2010    2010-"brief weakness,tongue thickness,incoherent"-no residual affects.  . Mitral regurgitation   . Myocardial infarction 2009  . Peritonitis 06/23/2013  . Ovarian cancer 1995    [name]  . ESRD (end stage renal disease)     Started dialysis in 2009 with hemodialysis and did HD for about 5 years using a R arm AVF.  Changed to PD due to pt likes to travel and is independent.  Her nephrologist is Dr Hinda Lenis in [place], [place].       Past Surgical History  Procedure Laterality Date  . Complete abdominal hysterectomy  1995  . Abdominal hysterectomy    . Colonoscopy with esophagogastroduodenoscopy (egd)  01/27/2012    Dr. Gala Romney. Small hiatal hernia, abnormal second portion of the duodenum with questionable extrinsic compression. Single left sided colonic diverticulum, 2 colon polyps. Hamartomatous colon polyp  . Eus  04/09/2012    Dr. Owens Loffler: Nonspecific edema/thickening of the periampullary duodenum. Unremarkable biopsy  . Tonsillectomy      child  . Peritoneal catheter insertion  10/30/2012    for  peritoneal dialysis  . Back surgery  2009    x3 level fusion-Dr. Louanne Skye  . Cardiac catheterization      x2 stents-'09- Dr. Joellen Jersey  . Esophagogastroduodenoscopy (egd) with propofol N/A 03/11/2013    Dr. Owens Loffler, small duodenal diverticulum. Edematous duodenal mucosa with friability on biopsy (pathology negative)  . Three-vessel cabg, mitral valve annuloplasty, maze  06/17/2013    [facility]  . Coronary artery bypass graft    . Esophagogastroduodenoscopy N/A 07/02/2013    SLF:GI BLEED DUE TO LARGE DUODENAL ULCER/Small hiatal hernia/PYLORIC CHANNEL ULCER/Multiple CLEANED BASED ulcers in the duodenal bulb and 2nd part of the duodenum  . Givens capsule study N/A 07/02/2013    Procedure: GIVENS CAPSULE STUDY;  Surgeon: Danie Binder, MD;  [facility];  Service: Endoscopy;  Laterality: N/A;  possible deployment in EGD negative  . Radiology with anesthesia N/A 08/19/2013    Procedure: RADIOLOGY WITH ANESTHESIA;  Surgeon: Rob Hickman, MD;  Location: [place];  Service: Radiology;  Laterality: N/A;    There were no vitals taken for this visit.  Visit Diagnosis:  Difficulty walking  Poor balance  Weakness of both legs      Subjective Assessment - 02/16/14 1349    Symptoms Pt states she is having a good day today.  States she is trying to gain a little weight.  Currently without pain.   Currently in Pain? No/denies            [facility] Adult PT Treatment/Exercise - 02/16/14 1310    Knee/Hip Exercises: Stretches   Active Hamstring Stretch 3 reps;30 seconds;Limitations   Active Hamstring Stretch Limitations standing 12" box   Gastroc Stretch 3 reps;30 seconds   Knee/Hip Exercises: Standing   Forward Lunges 10 reps;5 reps   Forward Lunges Limitations onto 6" step   Functional Squat 10 reps   Functional Squat Limitations lifting weighted ball off 12" Step   Other Standing Knee Exercises tandem gt, retro gait, sumo walk, heelwalk, toewalk 1RT each   Other  Standing Knee Exercises sit to stand x 5 ;              PT Short Term Goals - 02/16/14 1355    PT SHORT TERM GOAL #1   Title Pt/caregiver will Perform Home Exercise Program: For increased strengthening   Time 2   Period Weeks   Status Achieved   PT SHORT TERM GOAL #2   Title Pt to be able to verbalize the importance of diapharagmic breathing     Time 2   Period Weeks   Status Achieved   PT SHORT TERM GOAL #3   Title Pt to be able to ambulate with walker for five minutes without stopping for improved safety     Time 2   Period Weeks   Status Achieved   PT SHORT TERM GOAL #4   Title Pt to increase core strength to be able to stand for three minutes to be able to brush teeth at bathroom counter    Time 2   Period Weeks   Status Achieved   PT SHORT TERM GOAL #5   Title Pt to improve balance to  be able to SLS for five seconds to reduce risk of fall     Time 2   Period Weeks   Status On-going          PT Long Term Goals - 02/16/14 1355    PT LONG TERM GOAL #1   Title Independent in advance HEP   Time 4   Period Weeks   Status Partially Met   PT LONG TERM GOAL #2   Title PT to be able to  Increase strength to  come from sit to stand on one attempt     Time 4   Period Weeks   PT LONG TERM GOAL #3   Title Pt to be able to ambulate x 15 minutes with walker without stopping for better health habits     Time 4   Period Weeks   Status On-going   PT LONG TERM GOAL #4   Title Pt to increase core strength to be able to stand for five minutes to be able to complete self grooming activity.   Time 4   Period Weeks   Status Achieved   PT LONG TERM GOAL #5   Title Pt to be ambulating inside with a cane    Time 8   Period Weeks   Status On-going          Plan - 02/16/14 1357    Clinical Impression Statement Progressed balance actvities today adding heel walk and toe walk.  Pt with much improved tolerance and stablity with tandem gait, however difficulty with sit to  stands struggling to complete the last 2 repititions.  Overall improving actvitiy tolerance requiring 3 seated rest breaks today.  PT Next Visit Plan continue to progress balance, functional strength and activity tolerance.  Add nustep at end of session next visit and continue O2 monitoring as needed.        Problem List Patient Active Problem List   Diagnosis Date Noted  . PUD (peptic ulcer disease) 02/16/2014  . Fatty liver 02/16/2014  . Difficulty walking 01/03/2014  . Poor balance 01/03/2014  . Weakness of both legs 01/03/2014  . Acute respiratory failure with hypoxia 12/03/2013  . Pneumonia 12/02/2013  . HCAP (healthcare-associated pneumonia) 12/02/2013  . Acute on chronic diastolic heart failure 40/98/1191  . Hypokalemia 10/24/2013  . Diarrhea 10/24/2013  . Pulmonary edema, noncardiac 10/24/2013  . Appetite loss 10/24/2013  . Nausea with vomiting 10/24/2013  . Atrial fibrillation 08/23/2013  . History of CVA (cerebrovascular accident) 08/19/2013  . History of duodenal ulcer 07/16/2013  . Type II or unspecified type diabetes mellitus with renal manifestations, uncontrolled 06/29/2013  . History of angioedema 08/30/2012  . Anemia 01/24/2012  . HLD (hyperlipidemia) 10/02/2008  . CAD, NATIVE VESSEL 10/02/2008  . Chronic diastolic heart failure 47/82/9562  . Essential hypertension 09/17/2008  . ESRD (end stage renal disease) 09/17/2008        Teena Irani, PTA/CLT [phone] 02/16/2014, 2:04 PM

## 2014-02-16 NOTE — Patient Instructions (Addendum)
We have scheduled you for a special ultrasound to further look at your liver.   You have been scheduled for an upper endoscopy with Dr. Gala Romney to make sure your ulcers are healed.   Please review the reflux diet.   You may take Miralax 1 capful each evening as needed for constipation.   Food Choices for Gastroesophageal Reflux Disease When you have gastroesophageal reflux disease (GERD), the foods you eat and your eating habits are very important. Choosing the right foods can help ease the discomfort of GERD. WHAT GENERAL GUIDELINES DO I NEED TO FOLLOW?  Choose fruits, vegetables, whole grains, low-fat dairy products, and low-fat meat, fish, and poultry.  Limit fats such as oils, salad dressings, butter, nuts, and avocado.  Keep a food diary to identify foods that cause symptoms.  Avoid foods that cause reflux. These may be different for different people.  Eat frequent small meals instead of three large meals each day.  Eat your meals slowly, in a relaxed setting.  Limit fried foods.  Cook foods using methods other than frying.  Avoid drinking alcohol.  Avoid drinking large amounts of liquids with your meals.  Avoid bending over or lying down until 2-3 hours after eating. WHAT FOODS ARE NOT RECOMMENDED? The following are some foods and drinks that may worsen your symptoms: Vegetables Tomatoes. Tomato juice. Tomato and spaghetti sauce. Chili peppers. Onion and garlic. Horseradish. Fruits Oranges, grapefruit, and lemon (fruit and juice). Meats High-fat meats, fish, and poultry. This includes hot dogs, ribs, ham, sausage, salami, and bacon. Dairy Whole milk and chocolate milk. Sour cream. Cream. Butter. Ice cream. Cream cheese.  Beverages Coffee and tea, with or without caffeine. Carbonated beverages or energy drinks. Condiments Hot sauce. Barbecue sauce.  Sweets/Desserts Chocolate and cocoa. Donuts. Peppermint and spearmint. Fats and Oils High-fat foods, including  [place] fries and potato chips. Other Vinegar. Strong spices, such as black pepper, white pepper, red pepper, cayenne, curry powder, cloves, ginger, and chili powder. The items listed above may not be a complete list of foods and beverages to avoid. Contact your dietitian for more information. Document Released: 03/11/2005 Document Revised: 03/16/2013 Document Reviewed: 01/13/2013 [facility] Patient Information 2015 [place], [place]. This information is not intended to replace advice given to you by your health care provider. Make sure you discuss any questions you have with your health care provider.

## 2014-02-16 NOTE — Progress Notes (Signed)
Referring Provider: Shirline Frees, MD Primary Care Physician:  Shirline Frees, MD  Primary GI: Dr. Gala Romney   Chief Complaint  Patient presents with  . Follow-up    HPI:   [name] presents today in follow-up with a history of GI bleed in April 2015 requiring admission. EGD at that time with a large duodenal ulcer, pyloric channel ulcer, multiple clean based ulcers in the duodenal bulb and 2nd part of duodenum. Gastrin level elevated in the setting of Protonix. Negative H.pylori. PUD thought to be secondary to aspirin.   Overdue for surveillance EGD. On Protonix BID. Epigastric pain acute onset a few days ago. Called into office. Has noted intermittent epigastric discomfort, "can tell what bothers me". No N/V. No melena. No hematochezia. Good appetite. 81 mg ASA. Gallbladder remains in situ.   Hemodialysis. Tues, Thurs, Sat. Rehab M, W, F.   Will use homemade enema twice a week for constipation. This week felt to have a little more constipation. No diarrhea in a few weeks. Sometimes has urgency while out to eat.   Past Medical History  Diagnosis Date  . Diabetes mellitus   . Hypertension   . Hyperlipidemia   . Coronary artery disease   . Carpal tunnel syndrome   . Anemia   . Colon polyps 01/2012    Per colonoscopy, Dr. Gala Romney  . Transfusion history 03-04-13    [facility]fusion x1 unit a month ago-[facility]  . Stroke 2010    2010-"brief weakness,tongue thickness,incoherent"-no residual affects.  . Mitral regurgitation   . Myocardial infarction 2009  . Peritonitis 06/23/2013  . Ovarian cancer 1995    [name]  . ESRD (end stage renal disease)     Started dialysis in 2009 with hemodialysis and did HD for about 5 years using a R arm AVF.  Changed to PD due to pt likes to travel and is independent.  Her nephrologist is Dr Hinda Lenis in [place], [place].       Past Surgical History  Procedure Laterality Date  . Complete abdominal hysterectomy  1995  .  Abdominal hysterectomy    . Colonoscopy with esophagogastroduodenoscopy (egd)  01/27/2012    Dr. Gala Romney. Small hiatal hernia, abnormal second portion of the duodenum with questionable extrinsic compression. Single left sided colonic diverticulum, 2 colon polyps. Hamartomatous colon polyp  . Eus  04/09/2012    Dr. Owens Loffler: Nonspecific edema/thickening of the periampullary duodenum. Unremarkable biopsy  . Tonsillectomy      child  . Peritoneal catheter insertion  10/30/2012    for peritoneal dialysis  . Back surgery  2009    x3 level fusion-Dr. Louanne Skye  . Cardiac catheterization      x2 stents-'09- Dr. Joellen Jersey  . Esophagogastroduodenoscopy (egd) with propofol N/A 03/11/2013    Dr. Owens Loffler, small duodenal diverticulum. Edematous duodenal mucosa with friability on biopsy (pathology negative)  . Three-vessel cabg, mitral valve annuloplasty, maze  06/17/2013    [facility]  . Coronary artery bypass graft    . Esophagogastroduodenoscopy N/A 07/02/2013    Dr. Enzo Montgomery BLEED DUE TO LARGE DUODENAL ULCER/Small hiatal hernia/PYLORIC CHANNEL ULCER/Multiple CLEANED BASED ulcers in the duodenal bulb and 2nd part of the duodenum. NEGATIVE H.pylori. Elevated gastrin level in setting of Protonix  . Givens capsule study N/A 07/02/2013    not performed.   . Radiology with anesthesia N/A 08/19/2013    Procedure: RADIOLOGY WITH ANESTHESIA;  Surgeon: Rob Hickman, MD;  Location: [facility];  Service: Radiology;  Laterality: N/A;  Current Outpatient Prescriptions  Medication Sig Dispense Refill  . ALPRAZolam (XANAX) 0.5 MG tablet Take 0.5 mg by mouth at bedtime as needed for anxiety.    Marland Kitchen aspirin EC 81 MG tablet Take 1 tablet (81 mg total) by mouth daily. 90 tablet 3  . calcitRIOL (ROCALTROL) 0.5 MCG capsule Take 0.5 mcg by mouth daily.    . carvedilol (COREG) 3.125 MG tablet Take 1.5625 mg by mouth 2 (two) times daily with a meal. Take 1/2 tab in the morning and 1/2 in the evening.    Marland Kitchen  EPINEPHrine 0.3 mg/0.3 mL IJ SOAJ injection Inject 0.3 mg into the muscle daily as needed (allergic reaction).    . insulin NPH-regular Human (HUMULIN 70/30) (70-30) 100 UNIT/ML injection Inject 10-20 Units into the skin 2 (two) times daily with a meal.    . meclizine (ANTIVERT) 25 MG tablet Take 12.5 mg by mouth 2 (two) times daily as needed for dizziness (Vertigo).     . multivitamin (RENA-VIT) TABS tablet Take 1 tablet by mouth every morning.    . pantoprazole (PROTONIX) 40 MG tablet Take 1 tablet (40 mg total) by mouth 2 (two) times daily. 180 tablet 3  . simvastatin (ZOCOR) 40 MG tablet Take 1 tablet (40 mg total) by mouth at bedtime. 30 tablet 11   No current facility-administered medications for this visit.    Allergies as of 02/16/2014 - Review Complete 02/16/2014  Allergen Reaction Noted  . Lisinopril Swelling 08/30/2012  . Ezetimibe Swelling 11/24/2013    Family History  Problem Relation Age of Onset  . Diabetes Mother   . Heart attack Neg Hx   . Stroke Mother     History   Social History  . Marital Status: Married    Spouse Name: Sabra Heck    Number of Children: 3  . Years of Education: 10th   Occupational History  . RETIRED    Social History Main Topics  . Smoking status: Former Smoker -- 0.50 packs/day for 15 years    Quit date: 03/25/1958  . Smokeless tobacco: Never Used  . Alcohol Use: No  . Drug Use: No  . Sexual Activity: No   Other Topics Concern  . None   Social History Narrative   3 grown healthy children   Lives w/ husband   Has 8 grandkids   Caffeine use; occasionally    Review of Systems: Gen: see HPI CV: Denies chest pain, palpitations, syncope, peripheral edema, and claudication. Resp: Denies dyspnea at rest, cough, wheezing, coughing up blood, and pleurisy. GI: see HPI Derm: Denies rash, itching, dry skin Psych: Denies depression, anxiety, memory loss, confusion. No homicidal or suicidal ideation.  Heme: Denies bruising, bleeding, and  enlarged lymph nodes.  Physical Exam: BP 128/56 mmHg  Pulse 69  Temp(Src) 97 F (36.1 C) (Oral)  Ht 5\' 5"  (1.651 m)  Wt 135 lb (61.236 kg)  BMI 22.47 kg/m2 General:   Alert and oriented. No distress noted. Pleasant and cooperative.  Head:  Normocephalic and atraumatic. Eyes:  Conjuctiva clear without scleral icterus. Mouth:  Oral mucosa pink and moist. Good dentition. No lesions Heart:  S1, S2 present, query soft systolic murmur Abdomen:  +BS, soft, non-tender and non-distended. Peritoneal dialysis catheter intact Msk:  Symmetrical without gross deformities. Normal posture. Extremities:  Without edema. Neurologic:  Alert and  oriented x4;  grossly normal neurologically. Skin:  Intact without significant lesions or rashes. Psych:  Alert and cooperative. Normal mood and affect.  Lab Results  Component  Value Date   WBC 4.0 12/08/2013   HGB 9.3* 12/08/2013   HCT 31.5* 12/08/2013   MCV 107.5* 12/08/2013   PLT 158 12/08/2013

## 2014-02-21 ENCOUNTER — Ambulatory Visit (HOSPITAL_COMMUNITY)
Admission: RE | Admit: 2014-02-21 | Discharge: 2014-02-21 | Disposition: A | Discharge status: Home / Self Care | Payer: Medicare Other | Source: Ambulatory Visit | Attending: Family Medicine | Admitting: Family Medicine

## 2014-02-21 DIAGNOSIS — R262 Difficulty in walking, not elsewhere classified: Secondary | ICD-10-CM

## 2014-02-21 DIAGNOSIS — R2689 Other abnormalities of gait and mobility: Secondary | ICD-10-CM

## 2014-02-21 DIAGNOSIS — Z5189 Encounter for other specified aftercare: Secondary | ICD-10-CM | POA: Diagnosis not present

## 2014-02-21 DIAGNOSIS — R29898 Other symptoms and signs involving the musculoskeletal system: Secondary | ICD-10-CM

## 2014-02-21 NOTE — Therapy (Signed)
Physical Therapy Treatment  Patient Details  Name: [name] MRN: [id number] Date of Birth: 1945-09-22  Encounter Date: 02/21/2014      PT End of Session - 02/21/14 1153    Visit Number 15   Number of Visits 20   Date for PT Re-Evaluation 03/02/14      Past Medical History  Diagnosis Date  . Diabetes mellitus   . Hypertension   . Hyperlipidemia   . Coronary artery disease   . Carpal tunnel syndrome   . Anemia   . Colon polyps 01/2012    Per colonoscopy, Dr. Gala Romney  . Transfusion history 03-04-13    [facility]fusion [facility]1 unit a month ago-[facility]  . Stroke 2010    2010-"brief weakness,tongue thickness,incoherent"-no residual affects.  . Mitral regurgitation   . Myocardial infarction 2009  . Peritonitis 06/23/2013  . Ovarian cancer 1995    [name]  . ESRD (end stage renal disease)     Started dialysis in 2009 with hemodialysis and did HD for about 5 years using a R arm AVF.  Changed to PD due to pt likes to travel and is independent.  Her nephrologist is Dr Hinda Lenis in [place], [place].       Past Surgical History  Procedure Laterality Date  . Complete abdominal hysterectomy  1995  . Abdominal hysterectomy    . Colonoscopy with esophagogastroduodenoscopy (egd)  01/27/2012    Dr. Gala Romney. Small hiatal hernia, abnormal second portion of the duodenum with questionable extrinsic compression. Single left sided colonic diverticulum, 2 colon polyps. Hamartomatous colon polyp  . Eus  04/09/2012    Dr. Owens Loffler: Nonspecific edema/thickening of the periampullary duodenum. Unremarkable biopsy  . Tonsillectomy      child  . Peritoneal catheter insertion  10/30/2012    for peritoneal dialysis  . Back surgery  2009    x3 level fusion-Dr. Louanne Skye  . Cardiac catheterization      x2 stents-'09- Dr. Joellen Jersey  . Esophagogastroduodenoscopy (egd) with propofol N/A 03/11/2013    Dr. Owens Loffler, small duodenal diverticulum. Edematous duodenal mucosa with  friability on biopsy (pathology negative)  . Three-vessel cabg, mitral valve annuloplasty, maze  06/17/2013    [facility]  . Coronary artery bypass graft    . Esophagogastroduodenoscopy N/A 07/02/2013    SLF:GI BLEED DUE TO LARGE DUODENAL ULCER/Small hiatal hernia/PYLORIC CHANNEL ULCER/Multiple CLEANED BASED ulcers in the duodenal bulb and 2nd part of the duodenum  . Givens capsule study N/A 07/02/2013    Procedure: GIVENS CAPSULE STUDY;  Surgeon: Danie Binder, MD;  [facility];  Service: Endoscopy;  Laterality: N/A;  possible deployment in EGD negative  . Radiology with anesthesia N/A 08/19/2013    Procedure: RADIOLOGY WITH ANESTHESIA;  Surgeon: Rob Hickman, MD;  Location: [facility];  Service: Radiology;  Laterality: N/A;    There were no vitals taken for this visit.  Visit Diagnosis:  No diagnosis found.      Subjective Assessment - 02/21/14 1147    Symptoms Pt reports no complaints of pain, only stiffness in (B) LE.  Pt reports she has a rx from her MD for a rollator, and is going to try to go to [place] today to pick one up.             [facility] Adult PT Treatment/Exercise - 02/21/14 1106    Exercises   Exercises Knee/Hip;Balance   Knee/Hip Exercises: Stretches   Active Hamstring Stretch 3 reps;30 seconds;Limitations   Active Hamstring Stretch Limitations  14" Box   Press photographer 3 reps;30 seconds   Designer, industrial/product   Knee/Hip Exercises: Aerobic   Stationary [place] #2, Lv 2 4'   Knee/Hip Exercises: Standing   Knee Flexion 10 reps   Knee Flexion Limitations 2#   Forward Lunges 2 sets;5 reps   Forward Lunges Limitations onto 6" step   Knee/Hip Exercises: Seated   Long Arc Quad 2 sets;10 reps   Long Arc Quad Weight 2 lbs.   Other Seated Knee Exercises Marching 2# 2x10          PT Education - 02/21/14 1148    Education provided Yes   Education Details Use of rollator for longer distance only, to avoid reliance on  device; instructed pt to bring rollator to PT to adjust height if not set up at specialty pharmacy.             PT Long Term Goals - 02/21/14 1152    PT LONG TERM GOAL #1   Title Independent in advance HEP   Status On-going   PT LONG TERM GOAL #3   Title Pt to be able to ambulate x 15 minutes with walker without stopping for better health habits     Status On-going          Plan - 02/21/14 1153    Clinical Impression Statement Noted difficulty with standing exercises secondary to low activity tolerance, and standing and sitting exercise alternated to avoid rest breaks today. Added NuStep at end of treatment session to work on increasing activity tolerance with cueing to avoid stopping, even if speed decreases today; pt did require use of UE and LE.      Pt will benefit from skilled therapeutic intervention in order to improve on the following deficits Decreased activity tolerance;Decreased balance;Difficulty walking;Improper body mechanics   PT Next Visit Plan Progess time on NuStep to increase activity tolerance.          Problem List Patient Active Problem List   Diagnosis Date Noted  . PUD (peptic ulcer disease) 02/16/2014  . Fatty liver 02/16/2014  . Difficulty walking 01/03/2014  . Poor balance 01/03/2014  . Weakness of both legs 01/03/2014  . Acute respiratory failure with hypoxia 12/03/2013  . Pneumonia 12/02/2013  . HCAP (healthcare-associated pneumonia) 12/02/2013  . Acute on chronic diastolic heart failure 46/50/3546  . Hypokalemia 10/24/2013  . Diarrhea 10/24/2013  . Pulmonary edema, noncardiac 10/24/2013  . Appetite loss 10/24/2013  . Nausea with vomiting 10/24/2013  . Atrial fibrillation 08/23/2013  . History of CVA (cerebrovascular accident) 08/19/2013  . History of duodenal ulcer 07/16/2013  . Type II or unspecified type diabetes mellitus with renal manifestations, uncontrolled 06/29/2013  . History of angioedema 08/30/2012  . Anemia 01/24/2012  .  HLD (hyperlipidemia) 10/02/2008  . CAD, NATIVE VESSEL 10/02/2008  . Chronic diastolic heart failure 56/81/2751  . Essential hypertension 09/17/2008  . ESRD (end stage renal disease) 09/17/2008        Lonna Cobb, DPT [phone]

## 2014-02-23 ENCOUNTER — Ambulatory Visit (HOSPITAL_COMMUNITY)
Admission: RE | Admit: 2014-02-23 | Discharge: 2014-02-23 | Disposition: A | Discharge status: Home / Self Care | Payer: Medicare Other | Source: Ambulatory Visit | Attending: Family Medicine | Admitting: Family Medicine

## 2014-02-23 ENCOUNTER — Encounter: Payer: Self-pay | Admitting: Gastroenterology

## 2014-02-23 ENCOUNTER — Ambulatory Visit: Payer: Medicare Other | Admitting: Nurse Practitioner

## 2014-02-23 ENCOUNTER — Encounter (HOSPITAL_COMMUNITY): Payer: Self-pay

## 2014-02-23 DIAGNOSIS — R262 Difficulty in walking, not elsewhere classified: Secondary | ICD-10-CM | POA: Diagnosis not present

## 2014-02-23 DIAGNOSIS — Z5189 Encounter for other specified aftercare: Secondary | ICD-10-CM | POA: Diagnosis not present

## 2014-02-23 DIAGNOSIS — Z951 Presence of aortocoronary bypass graft: Secondary | ICD-10-CM | POA: Diagnosis not present

## 2014-02-23 DIAGNOSIS — M6281 Muscle weakness (generalized): Secondary | ICD-10-CM | POA: Diagnosis not present

## 2014-02-23 DIAGNOSIS — R29898 Other symptoms and signs involving the musculoskeletal system: Secondary | ICD-10-CM

## 2014-02-23 DIAGNOSIS — R2689 Other abnormalities of gait and mobility: Secondary | ICD-10-CM

## 2014-02-23 NOTE — Assessment & Plan Note (Addendum)
68 year old female hospitalized earlier this year with GI bleeding secondary to PUD, overdue for EGD surveillance. No overt signs of GI bleeding currently. However, she does note an acute episode of epigastric pain a few days ago, exacerbated by eating but without any other associated factors. Gallbladder remains in situ. Improved at this time. Will proceed with EGD for assessment of healing; if EGD normal and persistent dyspepsia, consider HIDA.   Proceed with upper endoscopy in the near future with Dr. Gala Romney. The risks, benefits, and alternatives have been discussed in detail with patient. They have stated understanding and desire to proceed.

## 2014-02-23 NOTE — Patient Instructions (Signed)
Thoracic Spine Matrix with Overhead Reaches                      

## 2014-02-23 NOTE — Progress Notes (Signed)
cc'ed to pcp °

## 2014-02-23 NOTE — Therapy (Signed)
[facility] [address] [place], [place], [address] Phone: [phone]   Fax:  [phone]  Physical Therapy Treatment  Patient Details  Name: [name] MRN: [id number] Date of Birth: 12/26/1945  Encounter Date: 02/23/2014      PT End of Session - 02/23/14 1207    Visit Number 16   Number of Visits 20   Date for PT Re-Evaluation 03/02/14   Authorization Type medicare   Authorization Time Period gcode complete 10th session   Authorization - Visit Number 16   Authorization - Number of Visits 20   PT Start Time 0350   PT Stop Time 1203   PT Time Calculation (min) 58 min   Equipment Utilized During Treatment Gait belt   Activity Tolerance Patient tolerated treatment well;Patient limited by fatigue   Behavior During Therapy [facility] for tasks assessed/performed      Past Medical History  Diagnosis Date  . Diabetes mellitus   . Hypertension   . Hyperlipidemia   . Coronary artery disease   . Carpal tunnel syndrome   . Anemia   . Colon polyps 01/2012    Per colonoscopy, Dr. Gala Romney  . Transfusion history 03-04-13    Transfusion x1 unit a month ago-[facility]  . Stroke 2010    2010-"brief weakness,tongue thickness,incoherent"-no residual affects.  . Mitral regurgitation   . Myocardial infarction 2009  . Peritonitis 06/23/2013  . Ovarian cancer 1995    [name]  . ESRD (end stage renal disease)     Started dialysis in 2009 with hemodialysis and did HD for about 5 years using a R arm AVF.  Changed to PD due to pt likes to travel and is independent.  Her nephrologist is Dr Hinda Lenis in [place], [place].       Past Surgical History  Procedure Laterality Date  . Complete abdominal hysterectomy  1995  . Abdominal hysterectomy    . Colonoscopy with esophagogastroduodenoscopy (egd)  01/27/2012    Dr. Gala Romney. Small hiatal hernia, abnormal second portion of the duodenum with questionable extrinsic compression. Single left sided colonic  diverticulum, 2 colon polyps. Hamartomatous colon polyp  . Eus  04/09/2012    Dr. Owens Loffler: Nonspecific edema/thickening of the periampullary duodenum. Unremarkable biopsy  . Tonsillectomy      child  . Peritoneal catheter insertion  10/30/2012    for peritoneal dialysis  . Back surgery  2009    x3 level fusion-Dr. Louanne Skye  . Cardiac catheterization      x2 stents-'09- Dr. Joellen Jersey  . Esophagogastroduodenoscopy (egd) with propofol N/A 03/11/2013    Dr. Owens Loffler, small duodenal diverticulum. Edematous duodenal mucosa with friability on biopsy (pathology negative)  . Three-vessel cabg, mitral valve annuloplasty, maze  06/17/2013    [facility]  . Coronary artery bypass graft    . Esophagogastroduodenoscopy N/A 07/02/2013    Dr. Enzo Montgomery BLEED DUE TO LARGE DUODENAL ULCER/Small hiatal hernia/PYLORIC CHANNEL ULCER/Multiple CLEANED BASED ulcers in the duodenal bulb and 2nd part of the duodenum. NEGATIVE H.pylori. Elevated gastrin level in setting of Protonix  . Givens capsule study N/A 07/02/2013    not performed.   . Radiology with anesthesia N/A 08/19/2013    Procedure: RADIOLOGY WITH ANESTHESIA;  Surgeon: Rob Hickman, MD;  Location: [facility];  Service: Radiology;  Laterality: N/A;    SpO2 94%  Visit Diagnosis:  Difficulty walking  Poor balance  Weakness of both legs      Subjective Assessment - 02/23/14 1146    Symptoms  Pt stated she is feeling a little better today, continues to be tired following dialysis; Endoscopy scheduled for next Monday.     Currently in Pain? No/denies            [facility] Adult PT Treatment/Exercise - 02/23/14 0001    Exercises   Exercises Knee/Hip;Balance   Knee/Hip Exercises: Aerobic   Stationary Ballplay #3, Lv 2 5'   Knee/Hip Exercises: Standing   Heel Raises 10 reps   Heel Raises Limitations lifting wt ball    Functional Squat 10 reps   Functional Squat Limitations lifting weighted ball off 12" Step   Other Standing  Knee Exercises tandem gt, retro gait, sumo walk, heelwalk, toewalk 1RT each   Other Standing Knee Exercises sit to stand x 5 each LE   Knee/Hip Exercises: Seated   Long Arc Quad 15 reps;Weights   Long Arc Quad Weight 5 lbs.   Other Seated Knee Exercises 3D thoracic excursion during rest breaks          PT Education - 02/23/14 1247    Education provided Yes   Education Details 3D thoracic excursion HEP given, education on importance of posture to assist with O2 sat %   Person(s) Educated Patient;Spouse   Methods Explanation;Demonstration;Handout   Comprehension Verbalized understanding;Returned demonstration;Verbal cues required          PT Short Term Goals - 02/23/14 1246    PT SHORT TERM GOAL #5   Title Pt to improve balance to  be able to SLS for five seconds to reduce risk of fall            PT Long Term Goals - 02/23/14 1246    PT LONG TERM GOAL #1   Title Independent in advance HEP   Status On-going   PT LONG TERM GOAL #2   Title PT to be able to  Increase strength to  come from sit to stand on one attempt     PT LONG TERM GOAL #3   Title Pt to be able to ambulate x 15 minutes with walker without stopping for better health habits     Status On-going   PT LONG TERM GOAL #4   Title Pt to increase core strength to be able to stand for five minutes to be able to complete self grooming activity.   PT LONG TERM GOAL #5   Title Pt to be ambulating inside with a cane           Plan - 02/23/14 1241    Clinical Impression Statement Session focus on funcional strengthening and postural education to improve breathing to keep O2 sat appropriate.  Added 3D thoracic excursion exercises to improve thoracic mobilty and posture during seated rest breaks.  Able to increase time and resistance with Nustep today, pt limited by fatigue with O2 sat of 94% at end of session.  No reports of pain through session.  Pt is improving with awareness of posture and to look up ajnd reports  increased tolerance for walking around the house.   PT Next Visit Plan Continue LE functional strengthening to increase ease with sit to stands and gait.  Continue Nustep for activity tolerance.  Give pt pedometer next session to improve awareness of steps taken daily.      Problem List Patient Active Problem List   Diagnosis Date Noted  . PUD (peptic ulcer disease) 02/16/2014  . Fatty liver 02/16/2014  . Difficulty walking 01/03/2014  . Poor balance 01/03/2014  .  Weakness of both legs 01/03/2014  . Acute respiratory failure with hypoxia 12/03/2013  . Pneumonia 12/02/2013  . HCAP (healthcare-associated pneumonia) 12/02/2013  . Acute on chronic diastolic heart failure 54/36/0677  . Hypokalemia 10/24/2013  . Diarrhea 10/24/2013  . Pulmonary edema, noncardiac 10/24/2013  . Appetite loss 10/24/2013  . Nausea with vomiting 10/24/2013  . Atrial fibrillation 08/23/2013  . History of CVA (cerebrovascular accident) 08/19/2013  . History of duodenal ulcer 07/16/2013  . Type II or unspecified type diabetes mellitus with renal manifestations, uncontrolled 06/29/2013  . History of angioedema 08/30/2012  . Anemia 01/24/2012  . HLD (hyperlipidemia) 10/02/2008  . CAD, NATIVE VESSEL 10/02/2008  . Chronic diastolic heart failure 03/40/3524  . Essential hypertension 09/17/2008  . ESRD (end stage renal disease) 09/17/2008   Ihor Austin, [place] Aldona Lento 02/23/2014, 12:48 PM

## 2014-02-23 NOTE — Assessment & Plan Note (Signed)
With concern for possible cirrhosis earlier this year. Proceed with [place] with elastography.

## 2014-02-25 ENCOUNTER — Ambulatory Visit (HOSPITAL_COMMUNITY): Payer: Medicare Other | Admitting: Physical Therapy

## 2014-02-25 ENCOUNTER — Telehealth (HOSPITAL_COMMUNITY): Payer: Self-pay | Admitting: Physical Therapy

## 2014-02-25 NOTE — Telephone Encounter (Signed)
Patient is sick and cx today and Monday

## 2014-02-28 ENCOUNTER — Other Ambulatory Visit: Payer: Self-pay | Admitting: Gastroenterology

## 2014-02-28 ENCOUNTER — Ambulatory Visit (HOSPITAL_COMMUNITY): Payer: Medicare Other | Admitting: Physical Therapy

## 2014-02-28 ENCOUNTER — Ambulatory Visit: Payer: Self-pay | Admitting: Vascular Surgery

## 2014-02-28 ENCOUNTER — Ambulatory Visit (HOSPITAL_COMMUNITY)
Admission: RE | Admit: 2014-02-28 | Discharge: 2014-02-28 | Disposition: A | Discharge status: Home / Self Care | Payer: Medicare Other | Source: Ambulatory Visit | Attending: Gastroenterology | Admitting: Gastroenterology

## 2014-02-28 ENCOUNTER — Other Ambulatory Visit (HOSPITAL_COMMUNITY): Payer: Self-pay | Admitting: Family Medicine

## 2014-02-28 DIAGNOSIS — K760 Fatty (change of) liver, not elsewhere classified: Secondary | ICD-10-CM

## 2014-02-28 DIAGNOSIS — J90 Pleural effusion, not elsewhere classified: Secondary | ICD-10-CM | POA: Diagnosis not present

## 2014-02-28 DIAGNOSIS — I7090 Unspecified atherosclerosis: Secondary | ICD-10-CM | POA: Diagnosis not present

## 2014-02-28 DIAGNOSIS — Z1231 Encounter for screening mammogram for malignant neoplasm of breast: Secondary | ICD-10-CM

## 2014-02-28 LAB — CBC
HCT: 33.6 % — AB (ref 35.0–47.0)
HGB: 10.3 g/dL — AB (ref 12.0–16.0)
MCH: 32.5 pg (ref 26.0–34.0)
MCHC: 30.7 g/dL — AB (ref 32.0–36.0)
MCV: 106 fL — ABNORMAL HIGH (ref 80–100)
Platelet: 208 10*3/uL (ref 150–440)
RBC: 3.17 10*6/uL — AB (ref 3.80–5.20)
RDW: 16.9 % — ABNORMAL HIGH (ref 11.5–14.5)
WBC: 3.9 10*3/uL (ref 3.6–11.0)

## 2014-02-28 LAB — BASIC METABOLIC PANEL
Anion Gap: 11 (ref 7–16)
BUN: 24 mg/dL — AB (ref 7–18)
CALCIUM: 8.9 mg/dL (ref 8.5–10.1)
CHLORIDE: 99 mmol/L (ref 98–107)
Co2: 30 mmol/L (ref 21–32)
Creatinine: 5.71 mg/dL — ABNORMAL HIGH (ref 0.60–1.30)
EGFR (Non-African Amer.): 8 — ABNORMAL LOW
GFR CALC AF AMER: 9 — AB
Glucose: 326 mg/dL — ABNORMAL HIGH (ref 65–99)
OSMOLALITY: 296 (ref 275–301)
POTASSIUM: 3.9 mmol/L (ref 3.5–5.1)
SODIUM: 140 mmol/L (ref 136–145)

## 2014-03-02 ENCOUNTER — Encounter (HOSPITAL_COMMUNITY): Payer: Self-pay | Admitting: *Deleted

## 2014-03-02 ENCOUNTER — Ambulatory Visit (HOSPITAL_COMMUNITY): Payer: Medicare Other | Admitting: Physical Therapy

## 2014-03-02 ENCOUNTER — Encounter (HOSPITAL_COMMUNITY)
Admission: RE | Disposition: A | Discharge status: Home / Self Care | Payer: Self-pay | Source: Ambulatory Visit | Attending: Internal Medicine

## 2014-03-02 ENCOUNTER — Ambulatory Visit (HOSPITAL_COMMUNITY)
Admission: RE | Admit: 2014-03-02 | Discharge: 2014-03-02 | Disposition: A | Discharge status: Home / Self Care | Payer: Medicare Other | Source: Ambulatory Visit | Attending: Internal Medicine | Admitting: Internal Medicine

## 2014-03-02 DIAGNOSIS — K760 Fatty (change of) liver, not elsewhere classified: Secondary | ICD-10-CM

## 2014-03-02 DIAGNOSIS — Z8673 Personal history of transient ischemic attack (TIA), and cerebral infarction without residual deficits: Secondary | ICD-10-CM | POA: Insufficient documentation

## 2014-03-02 DIAGNOSIS — I120 Hypertensive chronic kidney disease with stage 5 chronic kidney disease or end stage renal disease: Secondary | ICD-10-CM | POA: Insufficient documentation

## 2014-03-02 DIAGNOSIS — Z7982 Long term (current) use of aspirin: Secondary | ICD-10-CM | POA: Diagnosis not present

## 2014-03-02 DIAGNOSIS — K449 Diaphragmatic hernia without obstruction or gangrene: Secondary | ICD-10-CM | POA: Insufficient documentation

## 2014-03-02 DIAGNOSIS — Z87891 Personal history of nicotine dependence: Secondary | ICD-10-CM | POA: Diagnosis not present

## 2014-03-02 DIAGNOSIS — R1013 Epigastric pain: Secondary | ICD-10-CM | POA: Diagnosis present

## 2014-03-02 DIAGNOSIS — K3189 Other diseases of stomach and duodenum: Secondary | ICD-10-CM | POA: Insufficient documentation

## 2014-03-02 DIAGNOSIS — N186 End stage renal disease: Secondary | ICD-10-CM | POA: Insufficient documentation

## 2014-03-02 DIAGNOSIS — Z8711 Personal history of peptic ulcer disease: Secondary | ICD-10-CM | POA: Diagnosis not present

## 2014-03-02 DIAGNOSIS — E785 Hyperlipidemia, unspecified: Secondary | ICD-10-CM | POA: Diagnosis not present

## 2014-03-02 DIAGNOSIS — E119 Type 2 diabetes mellitus without complications: Secondary | ICD-10-CM | POA: Insufficient documentation

## 2014-03-02 DIAGNOSIS — I2510 Atherosclerotic heart disease of native coronary artery without angina pectoris: Secondary | ICD-10-CM | POA: Diagnosis not present

## 2014-03-02 DIAGNOSIS — K279 Peptic ulcer, site unspecified, unspecified as acute or chronic, without hemorrhage or perforation: Secondary | ICD-10-CM

## 2014-03-02 DIAGNOSIS — I252 Old myocardial infarction: Secondary | ICD-10-CM | POA: Insufficient documentation

## 2014-03-02 DIAGNOSIS — Z86010 Personal history of colonic polyps: Secondary | ICD-10-CM | POA: Insufficient documentation

## 2014-03-02 HISTORY — PX: ESOPHAGOGASTRODUODENOSCOPY: SHX5428

## 2014-03-02 LAB — GLUCOSE, CAPILLARY: GLUCOSE-CAPILLARY: 222 mg/dL — AB (ref 70–99)

## 2014-03-02 SURGERY — EGD (ESOPHAGOGASTRODUODENOSCOPY)
Anesthesia: Moderate Sedation

## 2014-03-02 MED ORDER — MEPERIDINE HCL 100 MG/ML IJ SOLN
INTRAMUSCULAR | Status: DC | PRN
  Administered 2014-03-02: 50 mg via INTRAVENOUS

## 2014-03-02 MED ORDER — LIDOCAINE VISCOUS 2 % MT SOLN
OROMUCOSAL | Status: DC | PRN
  Administered 2014-03-02: 3 mL via OROMUCOSAL

## 2014-03-02 MED ORDER — MIDAZOLAM HCL 5 MG/5ML IJ SOLN
INTRAMUSCULAR | Status: DC | PRN
  Administered 2014-03-02: 2 mg via INTRAVENOUS

## 2014-03-02 MED ORDER — LIDOCAINE VISCOUS 2 % MT SOLN
OROMUCOSAL | Status: AC
  Filled 2014-03-02: qty 15

## 2014-03-02 MED ORDER — ONDANSETRON HCL 4 MG/2ML IJ SOLN
INTRAMUSCULAR | Status: DC | PRN
  Administered 2014-03-02: 4 mg via INTRAVENOUS

## 2014-03-02 MED ORDER — SIMETHICONE 40 MG/0.6ML PO SUSP
ORAL | Status: DC | PRN
  Administered 2014-03-02: 09:00:00

## 2014-03-02 MED ORDER — MIDAZOLAM HCL 5 MG/5ML IJ SOLN
INTRAMUSCULAR | Status: AC
  Filled 2014-03-02: qty 10

## 2014-03-02 MED ORDER — ONDANSETRON HCL 4 MG/2ML IJ SOLN
INTRAMUSCULAR | Status: AC
  Filled 2014-03-02: qty 2

## 2014-03-02 MED ORDER — MEPERIDINE HCL 100 MG/ML IJ SOLN
INTRAMUSCULAR | Status: AC
  Filled 2014-03-02: qty 2

## 2014-03-02 MED ORDER — SODIUM CHLORIDE 0.9 % IV SOLN
INTRAVENOUS | Status: DC
  Administered 2014-03-02: 08:00:00 via INTRAVENOUS

## 2014-03-02 NOTE — Interval H&P Note (Signed)
History and Physical Interval Note:  03/02/2014 8:38 AM  [name]  has presented today for surgery, with the diagnosis of PUD  The various methods of treatment have been discussed with the patient and family. After consideration of risks, benefits and other options for treatment, the patient has consented to  Procedure(s) with comments: ESOPHAGOGASTRODUODENOSCOPY (EGD) (N/A) - 830  as a surgical intervention .  The patient's history has been reviewed, patient examined, no change in status, stable for surgery.  I have reviewed the patient's chart and labs.  Questions were answered to the patient's satisfaction.     [name]  No recent abdominal pain. Sludge versus small stones on ultrasound with cirrhotic appearing liver ;  metavir 3/4. EGD today to verify ulcer healing per plan. The risks, benefits, limitations, alternatives and imponderables have been reviewed with the patient. Potential for esophageal dilation, biopsy, etc. have also been reviewed.  Questions have been answered. All parties agreeable.

## 2014-03-02 NOTE — H&P (View-Only) (Signed)
Referring Provider: Shirline Frees, MD Primary Care Physician:  Shirline Frees, MD  Primary GI: Dr. Gala Romney   Chief Complaint  Patient presents with  . Follow-up    HPI:   [name] presents today in follow-up with a history of GI bleed in April 2015 requiring admission. EGD at that time with a large duodenal ulcer, pyloric channel ulcer, multiple clean based ulcers in the duodenal bulb and 2nd part of duodenum. Gastrin level elevated in the setting of Protonix. Negative H.pylori. PUD thought to be secondary to aspirin.   Overdue for surveillance EGD. On Protonix BID. Epigastric pain acute onset a few days ago. Called into office. Has noted intermittent epigastric discomfort, "can tell what bothers me". No N/V. No melena. No hematochezia. Good appetite. 81 mg ASA. Gallbladder remains in situ.   Hemodialysis. Tues, Thurs, Sat. Rehab M, W, F.   Will use homemade enema twice a week for constipation. This week felt to have a little more constipation. No diarrhea in a few weeks. Sometimes has urgency while out to eat.   Past Medical History  Diagnosis Date  . Diabetes mellitus   . Hypertension   . Hyperlipidemia   . Coronary artery disease   . Carpal tunnel syndrome   . Anemia   . Colon polyps 01/2012    Per colonoscopy, Dr. Gala Romney  . Transfusion history 03-04-13    Transfusion x1 unit a month ago-[facility]  . Stroke 2010    2010-"brief weakness,tongue thickness,incoherent"-no residual affects.  . Mitral regurgitation   . Myocardial infarction 2009  . Peritonitis 06/23/2013  . Ovarian cancer 1995    [name]  . ESRD (end stage renal disease)     Started dialysis in 2009 with hemodialysis and did HD for about 5 years using a R arm AVF.  Changed to PD due to pt likes to travel and is independent.  Her nephrologist is Dr Hinda Lenis in [place], [place].       Past Surgical History  Procedure Laterality Date  . Complete abdominal hysterectomy  1995  .  Abdominal hysterectomy    . Colonoscopy with esophagogastroduodenoscopy (egd)  01/27/2012    Dr. Gala Romney. Small hiatal hernia, abnormal second portion of the duodenum with questionable extrinsic compression. Single left sided colonic diverticulum, 2 colon polyps. Hamartomatous colon polyp  . Eus  04/09/2012    Dr. Owens Loffler: Nonspecific edema/thickening of the periampullary duodenum. Unremarkable biopsy  . Tonsillectomy      child  . Peritoneal catheter insertion  10/30/2012    for peritoneal dialysis  . Back surgery  2009    x3 level fusion-Dr. Louanne Skye  . Cardiac catheterization      x2 stents-'09- Dr. Joellen Jersey  . Esophagogastroduodenoscopy (egd) with propofol N/A 03/11/2013    Dr. Owens Loffler, small duodenal diverticulum. Edematous duodenal mucosa with friability on biopsy (pathology negative)  . Three-vessel cabg, mitral valve annuloplasty, maze  06/17/2013    [facility]  . Coronary artery bypass graft    . Esophagogastroduodenoscopy N/A 07/02/2013    Dr. Enzo Montgomery BLEED DUE TO LARGE DUODENAL ULCER/Small hiatal hernia/PYLORIC CHANNEL ULCER/Multiple CLEANED BASED ulcers in the duodenal bulb and 2nd part of the duodenum. NEGATIVE H.pylori. Elevated gastrin level in setting of Protonix  . Givens capsule study N/A 07/02/2013    not performed.   . Radiology with anesthesia N/A 08/19/2013    Procedure: RADIOLOGY WITH ANESTHESIA;  Surgeon: Rob Hickman, MD;  Location: [facility];  Service: Radiology;  Laterality: N/A;  Current Outpatient Prescriptions  Medication Sig Dispense Refill  . ALPRAZolam (XANAX) 0.5 MG tablet Take 0.5 mg by mouth at bedtime as needed for anxiety.    Marland Kitchen aspirin EC 81 MG tablet Take 1 tablet (81 mg total) by mouth daily. 90 tablet 3  . calcitRIOL (ROCALTROL) 0.5 MCG capsule Take 0.5 mcg by mouth daily.    . carvedilol (COREG) 3.125 MG tablet Take 1.5625 mg by mouth 2 (two) times daily with a meal. Take 1/2 tab in the morning and 1/2 in the evening.    Marland Kitchen  EPINEPHrine 0.3 mg/0.3 mL IJ SOAJ injection Inject 0.3 mg into the muscle daily as needed (allergic reaction).    . insulin NPH-regular Human (HUMULIN 70/30) (70-30) 100 UNIT/ML injection Inject 10-20 Units into the skin 2 (two) times daily with a meal.    . meclizine (ANTIVERT) 25 MG tablet Take 12.5 mg by mouth 2 (two) times daily as needed for dizziness (Vertigo).     . multivitamin (RENA-VIT) TABS tablet Take 1 tablet by mouth every morning.    . pantoprazole (PROTONIX) 40 MG tablet Take 1 tablet (40 mg total) by mouth 2 (two) times daily. 180 tablet 3  . simvastatin (ZOCOR) 40 MG tablet Take 1 tablet (40 mg total) by mouth at bedtime. 30 tablet 11   No current facility-administered medications for this visit.    Allergies as of 02/16/2014 - Review Complete 02/16/2014  Allergen Reaction Noted  . Lisinopril Swelling 08/30/2012  . Ezetimibe Swelling 11/24/2013    Family History  Problem Relation Age of Onset  . Diabetes Mother   . Heart attack Neg Hx   . Stroke Mother     History   Social History  . Marital Status: Married    Spouse Name: Sabra Heck    Number of Children: 3  . Years of Education: 10th   Occupational History  . RETIRED    Social History Main Topics  . Smoking status: Former Smoker -- 0.50 packs/day for 15 years    Quit date: 03/25/1958  . Smokeless tobacco: Never Used  . Alcohol Use: No  . Drug Use: No  . Sexual Activity: No   Other Topics Concern  . None   Social History Narrative   3 grown healthy children   Lives w/ husband   Has 8 grandkids   Caffeine use; occasionally    Review of Systems: Gen: see HPI CV: Denies chest pain, palpitations, syncope, peripheral edema, and claudication. Resp: Denies dyspnea at rest, cough, wheezing, coughing up blood, and pleurisy. GI: see HPI Derm: Denies rash, itching, dry skin Psych: Denies depression, anxiety, memory loss, confusion. No homicidal or suicidal ideation.  Heme: Denies bruising, bleeding, and  enlarged lymph nodes.  Physical Exam: BP 128/56 mmHg  Pulse 69  Temp(Src) 97 F (36.1 C) (Oral)  Ht 5\' 5"  (1.651 m)  Wt 135 lb (61.236 kg)  BMI 22.47 kg/m2 General:   Alert and oriented. No distress noted. Pleasant and cooperative.  Head:  Normocephalic and atraumatic. Eyes:  Conjuctiva clear without scleral icterus. Mouth:  Oral mucosa pink and moist. Good dentition. No lesions Heart:  S1, S2 present, query soft systolic murmur Abdomen:  +BS, soft, non-tender and non-distended. Peritoneal dialysis catheter intact Msk:  Symmetrical without gross deformities. Normal posture. Extremities:  Without edema. Neurologic:  Alert and  oriented x4;  grossly normal neurologically. Skin:  Intact without significant lesions or rashes. Psych:  Alert and cooperative. Normal mood and affect.  Lab Results  Component  Value Date   WBC 4.0 12/08/2013   HGB 9.3* 12/08/2013   HCT 31.5* 12/08/2013   MCV 107.5* 12/08/2013   PLT 158 12/08/2013

## 2014-03-02 NOTE — Discharge Instructions (Signed)
EGD Discharge instructions Please read the instructions outlined below and refer to this sheet in the next few weeks. These discharge instructions provide you with general information on caring for yourself after you leave the hospital. Your doctor may also give you specific instructions. While your treatment has been planned according to the most current medical practices available, unavoidable complications occasionally occur. If you have any problems or questions after discharge, please call your doctor. ACTIVITY  You may resume your regular activity but move at a slower pace for the next 24 hours.   Take frequent rest periods for the next 24 hours.   Walking will help expel (get rid of) the air and reduce the bloated feeling in your abdomen.   No driving for 24 hours (because of the anesthesia (medicine) used during the test).   You may shower.   Do not sign any important legal documents or operate any machinery for 24 hours (because of the anesthesia used during the test).  NUTRITION  Drink plenty of fluids.   You may resume your normal diet.   Begin with a light meal and progress to your normal diet.   Avoid alcoholic beverages for 24 hours or as instructed by your caregiver.  MEDICATIONS  You may resume your normal medications unless your caregiver tells you otherwise.  WHAT YOU CAN EXPECT TODAY  You may experience abdominal discomfort such as a feeling of fullness or gas pains.  FOLLOW-UP  Your doctor will discuss the results of your test with you.  SEEK IMMEDIATE MEDICAL ATTENTION IF ANY OF THE FOLLOWING OCCUR:  Excessive nausea (feeling sick to your stomach) and/or vomiting.   Severe abdominal pain and distention (swelling).   Trouble swallowing.   Temperature over 101 F (37.8 C).   Rectal bleeding or vomiting of blood.     Decrease Protonix to 40 mg once daily. Prescription provided.  Avoid all NSAIDs be on one baby aspirin daily  Office visit with  [place] in 6 weeks to begin cirrhosis care  Repeat EGD in 2 years.

## 2014-03-02 NOTE — Op Note (Signed)
[facility] [address] [place], [address]   ENDOSCOPY PROCEDURE REPORT  PATIENT: [name], [name]  MR#: [id number] BIRTHDATE: 1946-01-27 , 33  yrs. old GENDER: female ENDOSCOPIST: R.  Garfield Cornea, MD FACP FACG REFERRED BY:  Shirline Frees, M.D. PROCEDURE DATE:  03/14/2014 PROCEDURE:  EGD, diagnostic INDICATIONS:  self-limiting epigastric pain; history of peptic ulcer disease. MEDICATIONS: Versed 2 mg IV and Demerol 50 mg IV.  Zofran 4 mg IV. Xylocaine gel orally ASA CLASS:      Class III  CONSENT: The risks, benefits, limitations, alternatives and imponderables have been discussed.  The potential for biopsy, esophogeal dilation, etc. have also been reviewed.  Questions have been answered.  All parties agreeable.  Please see the history and physical in the medical record for more information.  DESCRIPTION OF PROCEDURE: After the risks benefits and alternatives of the procedure were thoroughly explained, informed consent was obtained.  The EG-[id number] ([id number]) endoscope was introduced through the mouth and advanced to the second portion of the duodenum , limited by Without limitations. The instrument was slowly withdrawn as the mucosa was fully examined.    Normal appearing tubular esophagus.  No varices.  Stomach empty. Subtle "snake skinning" or "fishscale" appearance of gastric mucosa consistent with portal gastropathy.  Linear focal redness /erythema circular distribution in the antrum concerning for early portal gastropathy.  No ulcer.  Small hiatal hernia.Patent pylorus. Examination of bulb and second portion revealed a superficial erosions straddling D1 and D2.  No ulcer.  Retroflexed views revealed as previously described.     The scope was then withdrawn from the patient and the procedure completed.  COMPLICATIONS: There were no immediate complications.  ENDOSCOPIC IMPRESSION: Previously noted peptic ulcer disease completely healed.  Duodenal erosions present. 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 Recent ultrasound demonstrated cirrhotic appearing liver with an F3-4  Metavir / Small stones?"sludge.  RECOMMENDATIONS: Decrease Protonix to 40 mg once daily. May continue a baby aspirin daily but no other NSAIDs. Discussed with husband. Return to the office for cirrhosis care in 6-8 weeks.  Repeat EGD 2 years.  REPEAT EXAM:  eSigned:  R. Garfield Cornea, MD [facility] 03-14-2014 9:09 AM    CC:  CPT CODES: ICD CODES:  The ICD and CPT codes recommended by this software are interpretations from the data that the clinical staff has captured with the software.  The verification of the translation of this report to the ICD and CPT codes and modifiers is the sole responsibility of the health care institution and practicing physician where this report was generated.  [employer]. will not be held responsible for the validity of the ICD and CPT codes included on this report.  AMA assumes no liability for data contained or not contained herein. CPT is a Designer, television/film set of the [employer].  PATIENT NAME:  Marialena, Wollen MR#: [id number]

## 2014-03-03 ENCOUNTER — Encounter (HOSPITAL_COMMUNITY): Payer: Self-pay | Admitting: Internal Medicine

## 2014-03-04 ENCOUNTER — Ambulatory Visit (HOSPITAL_COMMUNITY): Payer: Medicare Other | Admitting: Physical Therapy

## 2014-03-04 ENCOUNTER — Ambulatory Visit: Payer: Self-pay | Admitting: Vascular Surgery

## 2014-03-04 DIAGNOSIS — Z8711 Personal history of peptic ulcer disease: Secondary | ICD-10-CM | POA: Insufficient documentation

## 2014-03-07 ENCOUNTER — Ambulatory Visit (HOSPITAL_COMMUNITY): Payer: Medicare Other | Admitting: Physical Therapy

## 2014-03-07 ENCOUNTER — Telehealth (HOSPITAL_COMMUNITY): Payer: Self-pay | Admitting: Physical Therapy

## 2014-03-07 NOTE — Telephone Encounter (Signed)
She had surgery and is very sore today

## 2014-03-08 ENCOUNTER — Ambulatory Visit: Payer: Medicare Other | Admitting: Gastroenterology

## 2014-03-09 ENCOUNTER — Ambulatory Visit (HOSPITAL_COMMUNITY): Payer: Medicare Other | Admitting: Physical Therapy

## 2014-03-11 ENCOUNTER — Telehealth (HOSPITAL_COMMUNITY): Payer: Self-pay | Admitting: Physical Therapy

## 2014-03-11 ENCOUNTER — Ambulatory Visit (HOSPITAL_COMMUNITY): Payer: Medicare Other | Admitting: Physical Therapy

## 2014-03-11 NOTE — Telephone Encounter (Signed)
She is still sore from surgery

## 2014-03-15 NOTE — Progress Notes (Signed)
Quick Note:  Metavir score of F3/F4, likely early cirrhosis. EGD with changes of portal hypertension. Let's make sure she has an appt in the next 6-8 weeks for cirrhosis care. ______

## 2014-03-16 ENCOUNTER — Ambulatory Visit (HOSPITAL_COMMUNITY)
Admission: RE | Admit: 2014-03-16 | Discharge: 2014-03-16 | Disposition: A | Discharge status: Home / Self Care | Payer: Medicare Other | Source: Ambulatory Visit | Attending: Family Medicine | Admitting: Family Medicine

## 2014-03-16 DIAGNOSIS — Z5189 Encounter for other specified aftercare: Secondary | ICD-10-CM | POA: Diagnosis not present

## 2014-03-16 DIAGNOSIS — R262 Difficulty in walking, not elsewhere classified: Secondary | ICD-10-CM

## 2014-03-16 DIAGNOSIS — R29898 Other symptoms and signs involving the musculoskeletal system: Secondary | ICD-10-CM

## 2014-03-16 DIAGNOSIS — R2689 Other abnormalities of gait and mobility: Secondary | ICD-10-CM

## 2014-03-16 NOTE — Therapy (Signed)
[place], [place], [address] Phone: [phone]   Fax:  [phone]  Physical Therapy Evaluation  Patient Details  Name: [name] MRN: [id number] Date of Birth: October 23, 1945  Encounter Date: 03/16/2014      PT End of Session - 03/16/14 1209    Visit Number 17   Number of Visits 29   Date for PT Re-Evaluation 04/15/14   Authorization Type medicare   Authorization Time Period gcode complete 10th session   Authorization - Visit Number 17   Authorization - Number of Visits 27   PT Start Time 1105   PT Stop Time 1145   PT Time Calculation (min) 40 min      Past Medical History  Diagnosis Date  . Diabetes mellitus   . Hypertension   . Hyperlipidemia   . Coronary artery disease   . Carpal tunnel syndrome   . Anemia   . Colon polyps 01/2012    Per colonoscopy, Dr. Gala Romney  . Transfusion history 03-04-13    Transfusion x1 unit a month ago-[facility]  . Stroke 2010    2010-"brief weakness,tongue thickness,incoherent"-no residual affects.  . Mitral regurgitation   . Myocardial infarction 2009  . Peritonitis 06/23/2013  . Ovarian cancer 1995    [name]  . ESRD (end stage renal disease)     Started dialysis in 2009 with hemodialysis and did HD for about 5 years using a R arm AVF.  Changed to PD due to pt likes to travel and is independent.  Her nephrologist is Dr Hinda Lenis in [place], [place].       Past Surgical History  Procedure Laterality Date  . Complete abdominal hysterectomy  1995  . Abdominal hysterectomy    . Colonoscopy with esophagogastroduodenoscopy (egd)  01/27/2012    Dr. Gala Romney. Small hiatal hernia, abnormal second portion of the duodenum with questionable extrinsic compression. Single left sided colonic diverticulum, 2 colon polyps. Hamartomatous colon polyp  . Eus  04/09/2012    Dr. Owens Loffler: Nonspecific edema/thickening of the periampullary duodenum. Unremarkable biopsy   . Tonsillectomy      child  . Peritoneal catheter insertion  10/30/2012    for peritoneal dialysis  . Back surgery  2009    x3 level fusion-Dr. Louanne Skye  . Cardiac catheterization      x2 stents-'09- Dr. Joellen Jersey  . Esophagogastroduodenoscopy (egd) with propofol N/A 03/11/2013    Dr. Owens Loffler, small duodenal diverticulum. Edematous duodenal mucosa with friability on biopsy (pathology negative)  . Three-vessel cabg, mitral valve annuloplasty, maze  06/17/2013    [facility]  . Coronary artery bypass graft    . Esophagogastroduodenoscopy N/A 07/02/2013    Dr. Enzo Montgomery BLEED DUE TO LARGE DUODENAL ULCER/Small hiatal hernia/PYLORIC CHANNEL ULCER/Multiple CLEANED BASED ulcers in the duodenal bulb and 2nd part of the duodenum. NEGATIVE H.pylori. Elevated gastrin level in setting of Protonix  . Givens capsule study N/A 07/02/2013    not performed.   . Radiology with anesthesia N/A 08/19/2013    Procedure: RADIOLOGY WITH ANESTHESIA;  Surgeon: Rob Hickman, MD;  Location: [facility];  Service: Radiology;  Laterality: N/A;  . Esophagogastroduodenoscopy N/A 03/02/2014    Procedure: ESOPHAGOGASTRODUODENOSCOPY (EGD);  Surgeon: Daneil Dolin, MD;  [facility];  Service: Endoscopy;  Laterality: N/A;  830     There were no vitals taken for this visit.  Visit Diagnosis:  Difficulty walking  Weakness of both legs  Poor balance  Subjective Assessment - 03/16/14 1109    Symptoms Pt has not returned in three weeks she has had two moles taken off, had her colostomy bag removed and has had gall stones.  She is looking forward to cooking for the holidays.     How long can you sit comfortably? no problem    How long can you stand comfortably? able to stand for 30 minuts was 10 minutes    How long can you walk comfortably? Pt is only using her walker once in a great while now was walking with the walker all the time.    Patient Stated Goals I just want to get to normal again.      Currently in Pain? No/denies          [facility] PT Assessment - 03/16/14 0001    Assessment   Medical Diagnosis difficulty walking; decreased balance   Balance Screen   Has the patient fallen in the past 6 months No   Has the patient had a decrease in activity level because of a fear of falling?  Yes   Is the patient reluctant to leave their home because of a fear of falling?  No   Functional Tests   Functional tests Other   Other:   Other/ Comments 77minute =   unable to last 6' stopped 2'55' walked 226' in this time   AROM   Right Ankle Dorsiflexion -10   Left Ankle Dorsiflexion 2   Strength   Right Hip Flexion --  4-/5 was 3+/5   Right Hip Extension 4/5  was 3-/5   Right Hip ABduction --  4-/5 was 3+   Left Hip Flexion 3+/5  was 3+/5    Left Hip Extension 4/5  was 3-/5   Left Hip ABduction --  4-/5 was 3+   Right Knee Flexion 4/5  was 4-/5   Right Knee Extension 5/5  was 5/5   Left Knee Flexion --  4-/5   Left Knee Extension 5/5  was 5/5   Right Ankle Dorsiflexion 3+/5  was 3/5   Left Ankle Dorsiflexion --  4-/5 was 3+/5    Ambulation/Gait   Ambulation Distance (Feet) 226 Feet  was135   Balance   Balance Assessed --  Rt SLS 3 seconds; Lt 0 seconds    Functional Gait  Assessment   Gait assessed  --                  [facility] Adult PT Treatment/Exercise - 03/16/14 0001    Knee/Hip Exercises: Stretches   Gastroc Stretch --  PROM B dorsiflexion with jt mobs to improve Dorsiflexion   Knee/Hip Exercises: Standing   SLS x3 B                  PT Short Term Goals - 03/16/14 1215    PT SHORT TERM GOAL #1   Title Pt/caregiver will Perform Home Exercise Program: For increased strengthening   Time 2   Period Weeks   Status Achieved   PT SHORT TERM GOAL #2   Title Pt to be able to verbalize the importance of diapharagmic breathing     Time 2   Period Weeks   Status Achieved   PT SHORT TERM GOAL #3   Title Pt to be able to ambulate with  walker for five minutes without stopping for improved safety     Time 2   Period Weeks   Status Achieved  able to walk 5  minutes with walker but not without   PT SHORT TERM GOAL #4   Title Pt to increase core strength to be able to stand for three minutes to be able to brush teeth at bathroom counter    Time 2   Period Weeks   Status Achieved   PT SHORT TERM GOAL #5   Title Pt to improve balance to  be able to SLS for five seconds to reduce risk of fall     Time 6   Period Weeks   Status On-going           PT Long Term Goals - 2014-03-27 1217    PT LONG TERM GOAL #1   Title Independent in advance HEP   Time 4   Period Weeks   Status On-going   PT LONG TERM GOAL #2   Title PT to be able to  Increase strength to  come from sit to stand on one attempt     Time 4   Status Achieved   PT LONG TERM GOAL #3   Title Pt to be able to ambulate x 15 minutes with walker without stopping for better health habits     Time 4   Period Weeks   Status On-going   PT LONG TERM GOAL #5   Title Pt to be ambulating inside with a cane    Time 8   Status Achieved               Plan - 03/27/14 1210    Clinical Impression Statement Pt has been away from therapy for three weeks due to gall stones, getting colostomy removed and other medical issues.  Pt continues to improve in strength and functional mobility but still has significant limitation in strength, ROM for ankle dorsiflexion and balance.     Pt will benefit from skilled therapeutic intervention in order to improve on the following deficits Decreased activity tolerance;Decreased balance;Difficulty walking;Improper body mechanics   Rehab Potential Good   PT Frequency 3x / week    PT Duration 12 weeks an additional 4 weeks    PT Next Visit Plan Pt to walk each treatment working on having pt diaphramic breath while doing so; PROM to ankle each session if ROM has not imporved in 2 week may contact MD for JAS ankle;  continue treatment as  prior as pt is imroving in all aspects except ROM of ankles.           G-Codes - 03/27/2014 1216/07/03    Functional Assessment Tool Used clinical judgement    Functional Limitation Mobility: Walking and moving around   Mobility: Walking and Moving Around Current Status [phone]) At least 40 percent but less than 60 percent impaired, limited or restricted   Mobility: Walking and Moving Around Goal Status [phone]) At least 40 percent but less than 60 percent impaired, limited or restricted       Problem List Patient Active Problem List   Diagnosis Date Noted  . History of peptic ulcer disease   . PUD (peptic ulcer disease) 02/16/2014  . Fatty liver 02/16/2014  . Difficulty walking 01/03/2014  . Poor balance 01/03/2014  . Weakness of both legs 01/03/2014  . Acute respiratory failure with hypoxia 12/03/2013  . Pneumonia 12/02/2013  . HCAP (healthcare-associated pneumonia) 12/02/2013  . Acute on chronic diastolic heart failure 50/93/2671  . Hypokalemia 10/24/2013  . Diarrhea 10/24/2013  . Pulmonary edema, noncardiac 10/24/2013  . Appetite loss 10/24/2013  . Nausea with vomiting  10/24/2013  . Atrial fibrillation 08/23/2013  . History of CVA (cerebrovascular accident) 08/19/2013  . History of duodenal ulcer 07/16/2013  . Type II or unspecified type diabetes mellitus with renal manifestations, uncontrolled 06/29/2013  . History of angioedema 08/30/2012  . Anemia 01/24/2012  . HLD (hyperlipidemia) 10/02/2008  . CAD, NATIVE VESSEL 10/02/2008  . Chronic diastolic heart failure 21/62/4469  . Essential hypertension 09/17/2008  . ESRD (end stage renal disease) 09/17/2008    RUSSELL,CINDYPT 03/16/2014, 12:19 PM  [place], [place], [address] Phone: [phone]   Fax:  [phone]

## 2014-03-23 ENCOUNTER — Telehealth: Payer: Self-pay | Admitting: Cardiovascular Disease

## 2014-03-23 NOTE — Telephone Encounter (Signed)
New msg         Pt husband calling, states pt is feeling very sluggish and not well overall BP was taken today and it was 106/46 with HR 67. Pt would like a call back. \

## 2014-03-23 NOTE — Telephone Encounter (Signed)
Agree. Thanks

## 2014-03-23 NOTE — Telephone Encounter (Signed)
Called patient back about her blood pressure. Patient informed us that she had dialysis yesterday, and she feels dizzy and weak when standing. Informed patient her low blood pressure may be due to dialysis. Patient had a blood pressure of 72/46 yesterday while at the dialysis clinic, therefore she received fluids before she left and her pressure came up. Currently patient's blood pressure is 106/46 and heart rate is 67. Patient has dialysis again tomorrow. Advised patient to hold her Coreg tonight and if she felt dizzy to take her meclizine. Patient advised to contact her nephrologist and inform the dialysis clinic that her blood pressure was still low today. Ensured patient to stay hydrated and to call with any other question. Patient verbalized understanding.

## 2014-03-30 ENCOUNTER — Ambulatory Visit (HOSPITAL_COMMUNITY): Payer: Medicare Other | Admitting: Physical Therapy

## 2014-04-01 ENCOUNTER — Ambulatory Visit (HOSPITAL_COMMUNITY): Payer: Medicare Other | Attending: Family Medicine

## 2014-04-01 DIAGNOSIS — Z5189 Encounter for other specified aftercare: Secondary | ICD-10-CM | POA: Insufficient documentation

## 2014-04-01 DIAGNOSIS — M6281 Muscle weakness (generalized): Secondary | ICD-10-CM | POA: Insufficient documentation

## 2014-04-01 DIAGNOSIS — R262 Difficulty in walking, not elsewhere classified: Secondary | ICD-10-CM | POA: Insufficient documentation

## 2014-04-01 DIAGNOSIS — Z951 Presence of aortocoronary bypass graft: Secondary | ICD-10-CM | POA: Insufficient documentation

## 2014-04-04 ENCOUNTER — Ambulatory Visit (HOSPITAL_COMMUNITY)
Admission: RE | Admit: 2014-04-04 | Payer: Medicare Other | Source: Ambulatory Visit | Attending: Family Medicine | Admitting: Family Medicine

## 2014-04-04 ENCOUNTER — Telehealth: Payer: Self-pay | Admitting: *Deleted

## 2014-04-04 ENCOUNTER — Telehealth (HOSPITAL_COMMUNITY): Payer: Self-pay | Admitting: Physical Therapy

## 2014-04-04 NOTE — Telephone Encounter (Signed)
Spoke with patient to change appointment time, patient will call back to confirm appointment change for Wednesday 04/06/14 for 11:30 am instead of 11:45 am.

## 2014-04-04 NOTE — Telephone Encounter (Signed)
She is still sick with the flu or whatever she has per her husband

## 2014-04-06 ENCOUNTER — Ambulatory Visit: Payer: Self-pay | Admitting: Neurology

## 2014-04-08 ENCOUNTER — Ambulatory Visit (HOSPITAL_COMMUNITY): Payer: Medicare Other | Admitting: Physical Therapy

## 2014-04-11 ENCOUNTER — Ambulatory Visit (HOSPITAL_COMMUNITY): Payer: Medicare Other | Admitting: Physical Therapy

## 2014-04-13 ENCOUNTER — Ambulatory Visit (HOSPITAL_COMMUNITY): Payer: Medicare Other

## 2014-04-15 ENCOUNTER — Ambulatory Visit (HOSPITAL_COMMUNITY): Payer: Medicare Other | Admitting: Physical Therapy

## 2014-04-18 ENCOUNTER — Encounter (HOSPITAL_COMMUNITY): Payer: Medicare Other | Admitting: Physical Therapy

## 2014-04-19 ENCOUNTER — Telehealth: Payer: Self-pay | Admitting: *Deleted

## 2014-04-19 DIAGNOSIS — I25810 Atherosclerosis of coronary artery bypass graft(s) without angina pectoris: Secondary | ICD-10-CM

## 2014-04-19 NOTE — Telephone Encounter (Signed)
Spoke with pt. She has not spoken with Lannette Donath but would like to proceed with transplant evaluation.  She would like to proceed with stress myoview. Will have schedulers contact her to arrange stress test and follow up with Richardson Dopp, PA.  I verbally went over stress myoview instructions with pt.

## 2014-04-19 NOTE — Telephone Encounter (Signed)
Can we set her up for a stress myoview and then f/u with Richardson Dopp, PA-C after that to discuss operative risk?  Thanks, chris

## 2014-04-19 NOTE — Telephone Encounter (Signed)
Received direct call from [employer] optician at [employer] [phone], fax [phone]). She states pt's kidney transplant has been on hold due to cardiac issues. [name] is asking if pt can proceed with kidney transplant from a cardiac standpoint.  If so will need letter and last office visit and any cardiac testing faxed.

## 2014-04-20 ENCOUNTER — Encounter (HOSPITAL_COMMUNITY): Payer: Medicare Other | Admitting: Physical Therapy

## 2014-04-21 ENCOUNTER — Encounter: Payer: Self-pay | Admitting: Cardiology

## 2014-04-22 ENCOUNTER — Encounter: Payer: Self-pay | Admitting: Gastroenterology

## 2014-04-22 ENCOUNTER — Telehealth: Payer: Self-pay | Admitting: Cardiovascular Disease

## 2014-04-22 ENCOUNTER — Ambulatory Visit (INDEPENDENT_AMBULATORY_CARE_PROVIDER_SITE_OTHER): Payer: Medicare Other | Admitting: Gastroenterology

## 2014-04-22 VITALS — BP 132/62 | HR 69 | Temp 97.3°F | Ht 63.5 in | Wt 146.6 lb

## 2014-04-22 DIAGNOSIS — K7460 Unspecified cirrhosis of liver: Secondary | ICD-10-CM

## 2014-04-22 DIAGNOSIS — K5900 Constipation, unspecified: Secondary | ICD-10-CM | POA: Insufficient documentation

## 2014-04-22 DIAGNOSIS — R110 Nausea: Secondary | ICD-10-CM

## 2014-04-22 DIAGNOSIS — Z8711 Personal history of peptic ulcer disease: Secondary | ICD-10-CM

## 2014-04-22 MED ORDER — LINACLOTIDE 145 MCG PO CAPS: 145.0000 ug | ORAL_CAPSULE | Freq: Every day | ORAL | Status: AC

## 2014-04-22 MED ORDER — LUBIPROSTONE 8 MCG PO CAPS: 8.0000 ug | ORAL_CAPSULE | Freq: Two times a day (BID) | ORAL | Status: DC

## 2014-04-22 NOTE — Assessment & Plan Note (Signed)
Verified healing at time of last EGD. 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 Continue PPI therapy daily at this time.

## 2014-04-22 NOTE — Telephone Encounter (Signed)
New Msg         Pt husband calling and he would like to be contacted on how to administer meds, states pt doesn't understand.    Please contact him [phone].

## 2014-04-22 NOTE — Assessment & Plan Note (Signed)
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 Well compensated. Possibly related to fatty liver/NASH. Risk factor of diabetes. Previous viral markers negative. No evidence of elevated iron in 2015.  She believes she recently had a hepatitis B vaccine booster. She believe she's been vaccinated for hepatitis A. This was in the setting of hemodialysis preparation. She will be due for hepatoma screening in June via ultrasound. Advised that she would need this twice yearly along with labs. Recently had blood work with her nephrologist which we have requested. She will require repeat EGD in 2 years for esophageal varices screening.

## 2014-04-22 NOTE — Patient Instructions (Addendum)
1. I have requested copy of your labs for review. If any further are needed we will let you know. 2. I have also requested documentation for hepatitis A and B vaccination for your chart. 3. You will be due liver ultrasound in 08/2014. We will send you a reminder letter.  4. Start Linzess 131mcg daily on empty stomach for constipation. I am not sure if it is covered by insurance but RX sent. If you have trouble filling please let us know. Samples also provided.

## 2014-04-22 NOTE — Assessment & Plan Note (Signed)
Trial of Linzess 145 g daily. 30 day voucher provided. Discussed with patient and husband. If she continues to have abdominal discomfort and nausea with better management of bowel function they will give me a call. Once labs are reviewed we will determine timing of next follow-up.

## 2014-04-22 NOTE — Progress Notes (Signed)
Primary Care Physician: Shirline Frees, MD  Primary Gastroenterologist:  Garfield Cornea, MD   Chief Complaint  Patient presents with  . Follow-up    HPI: [name] is a 69 y.o. female here for follow-up. Last seen in November 2015. History of GI bleed in April 2015 requiring admission. EGD at that time with a large duodenal ulcer, pyloric channel ulcer, multiple clean-based ulcers in the duodenal bulb and second part of the duodenum. Gastric level elevated in the setting of Protonix. Negative H pylori. Peptic ulcer disease thought to be secondary to aspirin.  Follow-up EGD in December 2015, previously noted peptic ulcer disease completely healed. Duodenal erosions present. 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  Recent ultrasound had demonstrated a cirrhotic appearing liver with F3-4 Medavir. She returns to the office today for cirrhosis care. Will require repeat EGD in 2 years.  Hepatitis B surface antigen (2015) and hepatitis C antibody (2013) previously negative. Iron 21 last year.   Presents with husband who provides some of history. She complains of intermittent epigastric pain and nausea, last for few minutes. Worse the last week. May go weeks without. Not necessarily postprandially. No weight loss. Chronically with some early satiety. Eats multiple small meals daily. Some days diarrhea and some day constipation. New symptoms for several weeks. Used to be very regular. Came off of Renvel a and recently had to restart. Notes it  aggravates constipation. BM every other day for most part. No melena, brbpr.   Currently taking pantoprazole once daily.    Current Outpatient Prescriptions  Medication Sig Dispense Refill  . ALPRAZolam (XANAX) 0.5 MG tablet Take 0.5 mg by mouth at bedtime as needed for anxiety.    Marland Kitchen aspirin EC 81 MG tablet Take 1 tablet (81 mg total) by mouth daily. 90 tablet 3  . calcitRIOL (ROCALTROL) 0.5 MCG capsule Take 0.5 mcg  by mouth daily.    . carvedilol (COREG) 3.125 MG tablet Take 1.5625 mg by mouth 2 (two) times daily with a meal. Take 1/2 tab in the morning and 1/2 in the evening.    Marland Kitchen EPINEPHrine 0.3 mg/0.3 mL IJ SOAJ injection Inject 0.3 mg into the muscle daily as needed (allergic reaction).    . insulin NPH-regular Human (HUMULIN 70/30) (70-30) 100 UNIT/ML injection Inject 10-20 Units into the skin 2 (two) times daily with a meal.    . meclizine (ANTIVERT) 25 MG tablet Take 12.5 mg by mouth 2 (two) times daily as needed for dizziness (Vertigo).     . multivitamin (RENA-VIT) TABS tablet Take 1 tablet by mouth every morning.    . pantoprazole (PROTONIX) 40 MG tablet Take 1 tablet (40 mg total) by mouth 2 (two) times daily. (Patient taking differently: Take 40 mg by mouth daily. ) 180 tablet 3  . sevelamer carbonate (RENVELA) 800 MG tablet Take 800 mg by mouth 3 (three) times daily with meals. 2-3 with meals, 1-2 with snacks.    . simvastatin (ZOCOR) 40 MG tablet Take 1 tablet (40 mg total) by mouth at bedtime. 30 tablet 11   No current facility-administered medications for this visit.    Allergies as of 04/22/2014 - Review Complete 04/22/2014  Allergen Reaction Noted  . Lisinopril Swelling 08/30/2012  . Ezetimibe Swelling 11/24/2013   Past Surgical History  Procedure Laterality Date  . Complete abdominal hysterectomy  1995  . Abdominal hysterectomy    . Colonoscopy with esophagogastroduodenoscopy (egd)  01/27/2012    Dr. Gala Romney.  Small hiatal hernia, abnormal second portion of the duodenum with questionable extrinsic compression. Single left sided colonic diverticulum, 2 colon polyps. Hamartomatous colon polyp  . Eus  04/09/2012    Dr. Owens Loffler: Nonspecific edema/thickening of the periampullary duodenum. Unremarkable biopsy  . Tonsillectomy      child  . Peritoneal catheter insertion  10/30/2012    for peritoneal dialysis  . Back surgery  2009    x3 level fusion-Dr. Louanne Skye  . Cardiac  catheterization      x2 stents-'09- Dr. Joellen Jersey  . Esophagogastroduodenoscopy (egd) with propofol N/A 03/11/2013    Dr. Owens Loffler, small duodenal diverticulum. Edematous duodenal mucosa with friability on biopsy (pathology negative)  . Three-vessel cabg, mitral valve annuloplasty, maze  06/17/2013    [facility]  . Coronary artery bypass graft    . Esophagogastroduodenoscopy N/A 07/02/2013    Dr. Enzo Montgomery BLEED DUE TO LARGE DUODENAL ULCER/Small hiatal hernia/PYLORIC CHANNEL ULCER/Multiple CLEANED BASED ulcers in the duodenal bulb and 2nd part of the duodenum. NEGATIVE H.pylori. Elevated gastrin level in setting of Protonix  . Givens capsule study N/A 07/02/2013    not performed.   . Radiology with anesthesia N/A 08/19/2013    Procedure: RADIOLOGY WITH ANESTHESIA;  Surgeon: Rob Hickman, MD;  Location: [facility];  Service: Radiology;  Laterality: N/A;  . Esophagogastroduodenoscopy N/A 03/02/2014    RMR: Previously noted peptic ulcer disease completely heald. Duodenal erosions present. 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 Recent ultrasound demonstrated cirrhotic appearing liver with an F3-4 Metavir/small stones sludge.   Past Medical History  Diagnosis Date  . Diabetes mellitus   . Hypertension   . Hyperlipidemia   . Coronary artery disease   . Carpal tunnel syndrome   . Anemia   . Colon polyps 01/2012    Per colonoscopy, Dr. Gala Romney  . Transfusion history 03-04-13    Transfusion x1 unit a month ago-[facility]  . Stroke 2010    2010-"brief weakness,tongue thickness,incoherent"-no residual affects.  . Mitral regurgitation   . Myocardial infarction 2009  . Peritonitis 06/23/2013  . Ovarian cancer 1995    [name]  . ESRD (end stage renal disease)     Started dialysis in 2009 with hemodialysis and did HD for about 5 years using a R arm AVF.  Changed to PD due to pt likes to travel and is independent.  Her nephrologist is Dr  Hinda Lenis in [place], [place].       ROS:  General: Negative for anorexia, weight loss, fever, chills, fatigue, weakness. ENT: Negative for hoarseness, difficulty swallowing , nasal congestion. CV: Negative for chest pain, angina, palpitations, dyspnea on exertion, peripheral edema.  Respiratory: Negative for dyspnea at rest, dyspnea on exertion, cough, sputum, wheezing.  GI: See history of present illness. GU:  Negative for dysuria, hematuria, urinary incontinence, urinary frequency, nocturnal urination.  Endo: Negative for unusual weight change.    Physical Examination:   BP 132/62 mmHg  Pulse 69  Temp(Src) 97.3 F (36.3 C) (Oral)  Ht 5' 3.5" (1.613 m)  Wt 146 lb 9.6 oz (66.497 kg)  BMI 25.56 kg/m2  General: Well-nourished, well-developed in no acute distress.  Eyes: No icterus. Mouth: Oropharyngeal mucosa moist and pink , no lesions erythema or exudate. Lungs: Clear to auscultation bilaterally.  Heart: Regular rate and rhythm, no murmurs rubs or gallops.  Abdomen: Bowel sounds are normal, nontender, nondistended, no hepatosplenomegaly or masses, no abdominal bruits or hernia , no rebound or guarding.   Extremities: No lower  extremity edema. No clubbing or deformities. Neuro: Alert and oriented x 4   Skin: Warm and dry, no jaundice.   Psych: Alert and cooperative, normal mood and affect.  Labs:  Lab Results  Component Value Date   WBC 4.0 12/08/2013   HGB 9.3* 12/08/2013   HCT 31.5* 12/08/2013   MCV 107.5* 12/08/2013   PLT 158 12/08/2013   Lab Results  Component Value Date   CREATININE 2.41* 12/08/2013   BUN 5* 12/08/2013   NA 139 12/08/2013   K 4.5 12/08/2013   CL 99 12/08/2013   CO2 31 12/08/2013   Lab Results  Component Value Date   ALT 14 12/02/2013   AST 22 12/02/2013   ALKPHOS 75 12/02/2013   BILITOT 0.5 12/02/2013   No results found for: HAV, HEPAIGM, HEPBIGM, HEPBCAB, HBEAG, HEPCAB   Imaging Studies: No results found.

## 2014-04-22 NOTE — Telephone Encounter (Signed)
Left message to call back  

## 2014-04-25 ENCOUNTER — Encounter (HOSPITAL_COMMUNITY): Payer: Medicare Other

## 2014-04-27 ENCOUNTER — Telehealth: Payer: Self-pay | Admitting: Internal Medicine

## 2014-04-27 NOTE — Telephone Encounter (Signed)
The patient's husband called asking if LSL would return his call. He wanted to discuss the medication that she put her on when she was here 04/22/14. Please return call to [phone]

## 2014-04-28 NOTE — Telephone Encounter (Signed)
I spoke with the pts husband. He said pt is taking linzess and amitiza (he spelled this out for me) and currently having watery diarrhea. I cannot find any documentation in the chart that we have given the pt amitiza at any time, she may have gotten it from another doctor. I advised him that she shouldn't be taking both medications and that they were both for constipation. I also advised him that she should be on linzess and that she should hold the medication if she is having diarrhea. He verbalized understanding and said he was going to have her hold it for a couple of days and if she didn't get any better he would call me back.

## 2014-04-28 NOTE — Telephone Encounter (Signed)
Reviewed.  Amitiza was not listed on med list when I saw her. Agree with recommendations provided by Almyra Free.

## 2014-05-09 ENCOUNTER — Other Ambulatory Visit: Payer: Medicare Other

## 2014-05-09 ENCOUNTER — Ambulatory Visit: Payer: Medicare Other | Admitting: Physician Assistant

## 2014-05-11 ENCOUNTER — Telehealth: Payer: Self-pay | Admitting: Cardiovascular Disease

## 2014-05-11 ENCOUNTER — Other Ambulatory Visit: Payer: Medicare Other

## 2014-05-11 NOTE — Telephone Encounter (Signed)
New problem   Pt is being eval for kidney transplant and they are wanting to know status of  Clearance. Please advise

## 2014-05-11 NOTE — Telephone Encounter (Signed)
Patient cancelled appointment.

## 2014-05-11 NOTE — Telephone Encounter (Signed)
Talked to [place] with kidney transplant office. Informed her that patient has not completed test and cancelled most appointments this month that are to help determine her clearance status. Patient has no future appointments at this time. Will forward to Dr. Camillia Herter nurse Fraser Din.

## 2014-05-12 NOTE — Telephone Encounter (Signed)
Left message to call back  

## 2014-05-14 NOTE — Progress Notes (Signed)
CC'ED TO PCP 

## 2014-05-26 ENCOUNTER — Telehealth: Payer: Self-pay | Admitting: Cardiovascular Disease

## 2014-05-26 NOTE — Telephone Encounter (Signed)
Spoke with pt's husband and told him pt should eat breakfast prior to 8 AM tomorrow.  Nothing to eat after 8 AM. Take half normal insulin dose.  Check blood sugar between breakfast and appt time and may have juice if blood sugar low.  No caffeine.

## 2014-05-26 NOTE — Telephone Encounter (Signed)
Spoke with pt's husband today

## 2014-05-26 NOTE — Telephone Encounter (Signed)
New Message  Pt husband wants to speak w/ Rn about appt tomorrow. Please call back today- per pt. Please call back and discuss.

## 2014-05-27 ENCOUNTER — Encounter (HOSPITAL_COMMUNITY): Payer: Medicare Other

## 2014-06-03 ENCOUNTER — Encounter: Payer: Self-pay | Admitting: Physician Assistant

## 2014-06-13 NOTE — Progress Notes (Signed)
Reviewed records from dialysis. She has received recent Hep B booster. No indication of Hep A vaccination.   She will need labs in June (CMET, PT/INR, CBC, Hep A total). She will need abd u/s in June (hepatoma surveillance). OV in 09/2014.

## 2014-06-15 ENCOUNTER — Other Ambulatory Visit: Payer: Self-pay | Admitting: Gastroenterology

## 2014-06-15 DIAGNOSIS — K7460 Unspecified cirrhosis of liver: Secondary | ICD-10-CM

## 2014-06-15 NOTE — Progress Notes (Signed)
ON RECALL LIST  °

## 2014-06-15 NOTE — Progress Notes (Signed)
Lab order on file.  Please nic u/s and ov

## 2014-07-15 NOTE — Op Note (Signed)
PATIENT NAME:  [name], [name] MR#:  [id number] DATE OF BIRTH:  June 04, 1945  DATE OF PROCEDURE:  10/30/2012  PREOPERATIVE DIAGNOSES: End-stage renal disease, requiring dialysis POSTOPERATIVE DIAGNOSES: End-stage renal disease, requiring dialysis PROCEDURE PERFORMED: Laparoscopic insertion of peritoneal dialysis catheter.   SURGEON: Hortencia Pilar, M.D.   ANESTHESIA: General by endotracheal intubation.   FLUIDS: Per anesthesia record.   ESTIMATED BLOOD LOSS: Minimal.   SPECIMEN: None.   INDICATION FOR PROCEDURE: The patient has had progression of her renal disease and now has elected peritoneal dialysis and is undergoing [name] catheter insertion. Risks and benefits were reviewed. All questions answered. The patient agrees to proceed.   DESCRIPTION OF PROCEDURE: The patient is taken to the operating room and placed in the supine position. After adequate general anesthesia is induced and appropriate invasive monitors are placed, she is positioned supine and she is prepped from her nipple line down to below the pubis.   The patient is then draped in [name] sterile fashion. Appropriate timeout is called.   The patient has had [name] midline incision which actually extends supraumbilically for [name] hysterectomy, which was performed remotely, but based on this, [name] small vertical incision is made above the umbilicus through previous scar and it was subsequently carried down to expose the fascia. Fascia is elevated with [name] bone hook and then incised under direct visualization. Peritoneal cavity is entered under direct visualization and [name] Hasson trocar is placed. The abdominal cavity is insufflated to 15 mmHg. Camera is introduced.   [name] loop of bowel was noted to be adhesed to the midline just inferior to the umbilicus. [name] second loop is also adhesed to the anterior abdominal wall toward the midline lower down; however, the left lower quadrant appears to be easily accessible for catheter insertion and the pelvis itself  appears to be relatively free of adhesions. Therefore, the gas is expelled and [name] left lower quadrant incision is created and carried down to the fascia.   The abdomen is then re-insufflated and [name] 15-blade scalpel is used to make an incision in the anterior abdominal fascia under direct visualization. The peritoneal catheter with the trocar is then advanced through the abdominal wall and into the pelvis. The catheter is then advanced at  the first cuff. It is in contact with the fascia and it is then sewn to the fascia with 4 interrupted 0 Vicryl sutures.   Exit site is then selected. The gas is expelled from the abdominal cavity and [name] small incision is made at the exit site. [name] grasper is then tunneled subcutaneously and the catheter is pulled, seating the second cuff without difficulty. Titanium connector is then placed and the extension tube placed. Then 150 mL of saline is instilled into the abdomen with return of approximately 120 mL quite rapidly.   The trocar is removed. The fascia was grasped with Kochers and 2 interrupted 0 Vicryl sutures are used to close the fascia. The subcutaneous tissues are then reapproximated using interrupted 3-0 Vicryl in both incisions, and the skin is closed with 4-0 Monocryl subcuticular. Sterile dressing is applied at the exit site. The patient tolerated the procedure well. There were no immediate complications. Sponge and needle counts were correct and she is taken to the recovery area in excellent condition.    ____________________________ Katha Cabal, MD ggs:np D: 10/30/2012 17:06:01 ET T: 10/30/2012 21:51:58 ET JOB#: [id number]  cc: Katha Cabal, MD, <Dictator> Katha Cabal MD ELECTRONICALLY SIGNED 11/03/2012 8:51

## 2014-07-16 NOTE — Op Note (Signed)
PATIENT NAME:  [name], [name] MR#:  [id number] DATE OF BIRTH:  05/28/1945  DATE OF PROCEDURE:  03/04/2014  PREOPERATIVE DIAGNOSES:  1.  Complication of dialysis device.  2.  End-stage renal disease requiring hemodialysis.  3.  Diabetes.  4.  Hypertension.   POSTOPERATIVE DIAGNOSES:  1.  Complication of dialysis device.  2.  End-stage renal disease requiring hemodialysis.  3.  Diabetes.  4.  Hypertension.   PROCEDURE PERFORMED:  Removal of peritoneal dialysis catheter.   SURGEON: Katha Cabal, MD.   ANESTHESIA: General by LMA.   FLUIDS: Per anesthesia record.   ESTIMATED BLOOD LOSS: Minimal.   SPECIMEN: Catheter which is photographed for the permanent record.   INDICATIONS: Miss Zarazua is no longer doing well with peritoneal dialysis and therefore her catheter is being removed. The risks and benefits were reviewed. The patient agreed to proceed.   DESCRIPTION OF PROCEDURE: The patient is taken to the operating room and placed in the supine position. After adequate general anesthesia is induced and appropriate invasive monitors are placed she is positioned supine and her abdomen as well as the catheter is prepped and draped in sterile fashion. [name] curvilinear incision is made in the left lower quadrant overlying the catheter as it appears to dive down to the rectus. 0.25% Marcaine plain has been infiltrated into the surrounding tissues and the skin. The catheter is easily identified and the dissection is followed down to the rectus sheath and the cuff is freed from all surrounding tissues. Figure-of-eight 0 Vicryl suture is then placed around the catheter in the anterior sheath of the rectus and the catheter is removed. The 0 Vicryl suture is then secured, closing the fascial defect.   The second more proximal cuff is localized and dissected free. The catheter is then cut in 2 and removed without difficulty. It is actually removed in 3 separate pieces to avoid pulling contaminated  catheter through the subcutaneous tract. This is then photographed on the back table.   The wound is then irrigated with sterile saline, inspected for hemostasis, and then closed in 2 layers using 3-0 Vicryl, followed by 4-0 Monocryl subcuticular for the skin. Dermabond is applied to the skin incision. Bacitracin ointment is applied to the exit site and [name] small bandage is placed. The patient tolerated the procedure well and there were no immediate complications. Sponge and needle counts are correct and she is taken to the recovery room in excellent condition.    ____________________________ Katha Cabal, MD ggs:bu D: 03/04/2014 10:39:44 ET T: 03/04/2014 16:58:14 ET JOB#: [id number]  cc: Katha Cabal, MD, <Dictator> Katha Cabal MD ELECTRONICALLY SIGNED 03/08/2014 16:07

## 2014-08-08 ENCOUNTER — Telehealth: Payer: Self-pay | Admitting: Internal Medicine

## 2014-08-08 ENCOUNTER — Encounter: Payer: Self-pay | Admitting: Internal Medicine

## 2014-08-08 NOTE — Telephone Encounter (Signed)
On June recall for abd ultrasound

## 2014-08-09 ENCOUNTER — Other Ambulatory Visit: Payer: Self-pay

## 2014-08-09 DIAGNOSIS — C220 Liver cell carcinoma: Secondary | ICD-10-CM

## 2014-08-09 DIAGNOSIS — K760 Fatty (change of) liver, not elsewhere classified: Secondary | ICD-10-CM

## 2014-08-09 NOTE — Telephone Encounter (Signed)
Letter mailed  Pt is set for [place] on 08/25/2014 @ 915. Pt must be fasting

## 2014-08-10 ENCOUNTER — Other Ambulatory Visit (HOSPITAL_COMMUNITY): Payer: Self-pay | Admitting: Family Medicine

## 2014-08-10 DIAGNOSIS — Z1231 Encounter for screening mammogram for malignant neoplasm of breast: Secondary | ICD-10-CM

## 2014-08-15 ENCOUNTER — Other Ambulatory Visit: Payer: Self-pay

## 2014-08-15 DIAGNOSIS — K7460 Unspecified cirrhosis of liver: Secondary | ICD-10-CM

## 2014-08-16 ENCOUNTER — Encounter: Payer: Self-pay | Admitting: Internal Medicine

## 2014-08-16 ENCOUNTER — Telehealth: Payer: Self-pay | Admitting: Internal Medicine

## 2014-08-16 MED ORDER — PANTOPRAZOLE SODIUM 40 MG PO TBEC: 40.0000 mg | DELAYED_RELEASE_TABLET | Freq: Two times a day (BID) | ORAL | Status: AC

## 2014-08-16 NOTE — Telephone Encounter (Signed)
Routing to the refill box. 

## 2014-08-16 NOTE — Telephone Encounter (Signed)
Patient's husband came to front office window this  morning saying that his wife has one pill left of her panoprazole and needs a refill right away called into [facility].

## 2014-08-16 NOTE — Telephone Encounter (Signed)
Done

## 2014-08-25 ENCOUNTER — Other Ambulatory Visit (HOSPITAL_COMMUNITY): Payer: Medicare Other

## 2014-08-31 ENCOUNTER — Ambulatory Visit (HOSPITAL_COMMUNITY)
Admission: RE | Admit: 2014-08-31 | Discharge: 2014-08-31 | Disposition: A | Discharge status: Home / Self Care | Payer: Medicare Other | Source: Ambulatory Visit | Attending: Gastroenterology | Admitting: Gastroenterology

## 2014-08-31 ENCOUNTER — Ambulatory Visit (HOSPITAL_COMMUNITY)
Admission: RE | Admit: 2014-08-31 | Discharge: 2014-08-31 | Disposition: A | Discharge status: Home / Self Care | Payer: Medicare Other | Source: Ambulatory Visit | Attending: Family Medicine | Admitting: Family Medicine

## 2014-08-31 ENCOUNTER — Ambulatory Visit (HOSPITAL_COMMUNITY): Payer: Medicare Other

## 2014-08-31 DIAGNOSIS — C220 Liver cell carcinoma: Secondary | ICD-10-CM

## 2014-08-31 DIAGNOSIS — K760 Fatty (change of) liver, not elsewhere classified: Secondary | ICD-10-CM

## 2014-08-31 DIAGNOSIS — Z1231 Encounter for screening mammogram for malignant neoplasm of breast: Secondary | ICD-10-CM | POA: Diagnosis present

## 2014-08-31 NOTE — Progress Notes (Signed)
Quick Note:  No evidence of hepatoma.  Await labs. OV in 09/2014 as planned.  Next abd u/s in six months for hepatoma surveillance. Please NIC. ______

## 2014-09-05 ENCOUNTER — Encounter: Payer: Self-pay | Admitting: General Practice

## 2014-09-15 ENCOUNTER — Other Ambulatory Visit: Payer: Self-pay | Admitting: Orthopedic Surgery

## 2014-09-16 ENCOUNTER — Ambulatory Visit (INDEPENDENT_AMBULATORY_CARE_PROVIDER_SITE_OTHER): Payer: Medicare Other | Admitting: Cardiovascular Disease

## 2014-09-16 ENCOUNTER — Encounter: Payer: Self-pay | Admitting: Cardiovascular Disease

## 2014-09-16 VITALS — BP 118/70 | HR 104 | Ht 63.5 in | Wt 134.0 lb

## 2014-09-16 DIAGNOSIS — E785 Hyperlipidemia, unspecified: Secondary | ICD-10-CM

## 2014-09-16 DIAGNOSIS — N186 End stage renal disease: Secondary | ICD-10-CM

## 2014-09-16 DIAGNOSIS — I5032 Chronic diastolic (congestive) heart failure: Secondary | ICD-10-CM

## 2014-09-16 DIAGNOSIS — I10 Essential (primary) hypertension: Secondary | ICD-10-CM | POA: Diagnosis not present

## 2014-09-16 DIAGNOSIS — I25810 Atherosclerosis of coronary artery bypass graft(s) without angina pectoris: Secondary | ICD-10-CM

## 2014-09-16 MED ORDER — CARVEDILOL 3.125 MG PO TABS: 3.1250 mg | ORAL_TABLET | Freq: Two times a day (BID) | ORAL | Status: AC

## 2014-09-16 NOTE — Patient Instructions (Signed)
Medication Instructions:  Your physician has recommended you make the following change in your medication: Increase carvedilol to 3.125 mg by mouth twice daily.   Labwork: none  Testing/Procedures: none  Follow-Up: Your physician wants you to follow-up in: 6 months.  You will receive a reminder letter in the mail two months in advance. If you don't receive a letter, please call our office to schedule the follow-up appointment.

## 2014-09-16 NOTE — Progress Notes (Signed)
Chief Complaint  Patient presents with  . Shortness of Breath      History of Present Illness: 69 yo female with a history of the ESRD on hemodialysis, CAD s/p CABG March 2015 at [facility], prior TIA, HTN, HLD, DM here today for cardiac follow up. She had been previously followed by Dr. Olevia Perches and I met her for the first time in April 2013. She had undergone PCI of the Circumflex in 2008 with DES, known occlusion of LAD. She was ultimately seen at [facility] and underwent CABG 3 (SVG-OM, SVG-D1, LIMA-LAD), mitral valve repair, maze procedure and excision of the left atrial appendage in 05/2013 with Dr. Lunette Stands.She was admitted for upper GI bleed secondary to duodenal ulcer in 06/2013. She was admitted for left MCA CVA and 08/2013. Notes indicate that atrial fibrillation was documented on admission. However, the patient was not placed on Coumadin or antiplatelet therapy given her GI bleed. Apparently, her gastroenterologist has recommended that she not take anticoagulation therapy until cleared with a follow-up EGD.She was admitted to [facility] 11/2013 with acute hypoxic respiratory failure in the setting of acute on chronic diastolic heart failure, right-sided pleural effusion and HCAP. Patient underwent dialysis for 5 days in a row with improvement in her volume status. She was seen here by Richardson Dopp, PA-C on 01/24/14 for surgical clearance for peritoneal dialysis catheter removal. Echo May 2015 with Moderate LVH, LVEF 55-60%, normal wall motion, mitral valve repair okay, mild mitral stenosis (mean 8 mmHg), mild to moderate LAE.   She is here today for follow up. No chest pain or SOB. She feels well. No LE edema. She has been having higher heart rates. Feels tired after 3 hour dialysis run.   Primary care: Azalia Bilis ([place])   Past Medical History  Diagnosis Date  . Diabetes mellitus   . Hypertension   . Hyperlipidemia   . Coronary artery disease   . Carpal tunnel  syndrome   . Anemia   . Colon polyps 01/2012    Per colonoscopy, Dr. Gala Romney  . Transfusion history 03-04-13    Transfusion x1 unit a month ago-[facility]  . Stroke 2010    2010-"brief weakness,tongue thickness,incoherent"-no residual affects.  . Mitral regurgitation   . Myocardial infarction 2009  . Peritonitis 06/23/2013  . Ovarian cancer 1995    [name]  . ESRD (end stage renal disease)     Started dialysis in 2009 with hemodialysis and did HD for about 5 years using a R arm AVF.  Changed to PD due to pt likes to travel and is independent.  Her nephrologist is Dr Hinda Lenis in [place], [place].       Past Surgical History  Procedure Laterality Date  . Complete abdominal hysterectomy  1995  . Abdominal hysterectomy    . Colonoscopy with esophagogastroduodenoscopy (egd)  01/27/2012    Dr. Gala Romney. Small hiatal hernia, abnormal second portion of the duodenum with questionable extrinsic compression. Single left sided colonic diverticulum, 2 colon polyps. Hamartomatous colon polyp  . Eus  04/09/2012    Dr. Owens Loffler: Nonspecific edema/thickening of the periampullary duodenum. Unremarkable biopsy  . Tonsillectomy      child  . Peritoneal catheter insertion  10/30/2012    for peritoneal dialysis  . Back surgery  2009    x3 level fusion-Dr. Louanne Skye  . Cardiac catheterization      x2 stents-'09- Dr. Joellen Jersey  . Esophagogastroduodenoscopy (egd) with propofol N/A 03/11/2013    Dr. Owens Loffler,  small duodenal diverticulum. Edematous duodenal mucosa with friability on biopsy (pathology negative)  . Three-vessel cabg, mitral valve annuloplasty, maze  06/17/2013    [facility]  . Coronary artery bypass graft    . Esophagogastroduodenoscopy N/A 07/02/2013    Dr. Enzo Montgomery BLEED DUE TO LARGE DUODENAL ULCER/Small hiatal hernia/PYLORIC CHANNEL ULCER/Multiple CLEANED BASED ulcers in the duodenal bulb and 2nd part of the duodenum. NEGATIVE H.pylori. Elevated gastrin level in  setting of Protonix  . Givens capsule study N/A 07/02/2013    not performed.   . Radiology with anesthesia N/A 08/19/2013    Procedure: RADIOLOGY WITH ANESTHESIA;  Surgeon: Rob Hickman, MD;  Location: [facility];  Service: Radiology;  Laterality: N/A;  . Esophagogastroduodenoscopy N/A 03/02/2014    RMR: Previously noted peptic ulcer disease completely heald. Duodenal erosions present. 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 Recent ultrasound demonstrated cirrhotic appearing liver with an F3-4 Metavir/small stones sludge.    Current Outpatient Prescriptions  Medication Sig Dispense Refill  . ALPRAZolam (XANAX) 0.5 MG tablet Take 0.5 mg by mouth at bedtime as needed for anxiety.    Marland Kitchen aspirin EC 81 MG tablet Take 1 tablet (81 mg total) by mouth daily. 90 tablet 3  . calcitRIOL (ROCALTROL) 0.5 MCG capsule Take 0.5 mcg by mouth daily.    Marland Kitchen EPINEPHrine 0.3 mg/0.3 mL IJ SOAJ injection Inject 0.3 mg into the muscle daily as needed (allergic reaction).    . insulin NPH-regular Human (HUMULIN 70/30) (70-30) 100 UNIT/ML injection Inject 10-20 Units into the skin 2 (two) times daily with a meal.    . Linaclotide (LINZESS) 145 MCG CAPS capsule Take 1 capsule (145 mcg total) by mouth daily. On empty stomach. 30 capsule 5  . meclizine (ANTIVERT) 25 MG tablet Take 12.5 mg by mouth 2 (two) times daily as needed for dizziness (Vertigo).     . multivitamin (RENA-VIT) TABS tablet Take 1 tablet by mouth every morning.    . pantoprazole (PROTONIX) 40 MG tablet Take 1 tablet (40 mg total) by mouth 2 (two) times daily. 180 tablet 1  . sevelamer carbonate (RENVELA) 800 MG tablet Take 800 mg by mouth 3 (three) times daily with meals. 2-3 with meals, 1-2 with snacks.    . simvastatin (ZOCOR) 10 MG tablet Take 10 mg by mouth daily.    . carvedilol (COREG) 3.125 MG tablet Take 1 tablet (3.125 mg total) by mouth 2 (two) times daily with a meal. 60 tablet 6   No current facility-administered  medications for this visit.    Allergies  Allergen Reactions  . Lisinopril Swelling  . Ezetimibe Swelling    ZETIA: Tongue swelling    History   Social History  . Marital Status: Married    Spouse Name: Sabra Heck  . Number of Children: 3  . Years of Education: 10th   Occupational History  . RETIRED    Social History Main Topics  . Smoking status: Former Smoker -- 0.50 packs/day for 15 years    Quit date: 03/25/1958  . Smokeless tobacco: Never Used  . Alcohol Use: No  . Drug Use: No  . Sexual Activity: No   Other Topics Concern  . Not on file   Social History Narrative   3 grown healthy children   Lives w/ husband   Has 8 grandkids   Caffeine use; occasionally    Family History  Problem Relation Age of Onset  . Diabetes Mother   . Heart attack Neg Hx   .  Stroke Mother     Review of Systems:  As stated in the HPI and otherwise negative.   BP 118/70 mmHg  Pulse 104  Ht 5' 3.5" (1.613 m)  Wt 134 lb (60.782 kg)  BMI 23.36 kg/m2  Physical Examination: General: Well developed, well nourished, NAD HEENT: OP clear, mucus membranes moist SKIN: warm, dry. No rashes. Neuro: No focal deficits Musculoskeletal: Muscle strength 5/5 all ext Psychiatric: Mood and affect normal Neck: No JVD, no carotid bruits, no thyromegaly, no lymphadenopathy. Lungs:Clear bilaterally, no wheezes, rhonci, crackles Cardiovascular: Regular rate and rhythm. No murmurs, gallops or rubs. Abdomen:Soft. Bowel sounds present. Non-tender.  Extremities: No lower extremity edema. Pulses are 2 + in the bilateral DP/PT.  EKG:  EKG is not ordered today. The ekg ordered today demonstrates   Recent Labs: 12/02/2013: ALT 14; Pro B Natriuretic peptide (BNP) 36660.0*; TSH 2.220 12/03/2013: Magnesium 1.8 02/28/2014: BUN 24*; Creatinine 5.71*; HGB 10.3*; Platelet 208; Potassium 3.9; Sodium 140   Lipid Panel    Component Value Date/Time   CHOL 109 08/19/2013 0554   TRIG 79 08/19/2013 0554   HDL 51  08/19/2013 0554   CHOLHDL 2.1 08/19/2013 0554   VLDL 16 08/19/2013 0554   LDLCALC 42 08/19/2013 0554   LDLDIRECT 110.7 08/25/2006 0841     Wt Readings from Last 3 Encounters:  09/16/14 134 lb (60.782 kg)  04/22/14 146 lb 9.6 oz (66.497 kg)  03/02/14 135 lb (61.236 kg)     Other studies Reviewed: Additional studies/ records that were reviewed today include: . Review of the above records demonstrates:    Assessment and Plan:   1. CAD: No angina. Continue beta blocker and ASA/statin.    2. HLD: Will continue simvastatin as above. Lipids well controlled.   3. Atrial fibrillation, paroxysmal: No clear documentation of this. She had a Maze procedure and left atrial appendage excision this year.  The likelihood that she would have CVA from atrial fibrillation is quite low. Will not start coumadin.   4. Chronic diastolic heart failure: Volume management per dialysis.   5. Essential hypertension: BP controlled. No changes.   6. ESRD:  She remains on HD. (Tuesday, Thursday, Saturday)  7. Tachycardia: Will increase Coreg to 3.125 mg po BID. I have offered a monitor but she does not wish to schedule at this time. She may be slightly dry. Will ask Nephrology to consider her dry weight.   Current medicines are reviewed at length with the patient today.  The patient does not have concerns regarding medicines.  The following changes have been made:  no change  Labs/ tests ordered today include:  No orders of the defined types were placed in this encounter.    Disposition:   FU with me in 6 months.   Signed, Lauree Chandler, MD 09/16/2014 5:16 PM    [address], [place], [place]  [address] Phone: [phone]; Fax: [phone]

## 2014-09-22 ENCOUNTER — Other Ambulatory Visit: Payer: Self-pay

## 2014-09-22 ENCOUNTER — Emergency Department (HOSPITAL_COMMUNITY): Payer: Medicare Other

## 2014-09-22 ENCOUNTER — Encounter (HOSPITAL_COMMUNITY): Payer: Self-pay | Admitting: Emergency Medicine

## 2014-09-22 ENCOUNTER — Inpatient Hospital Stay (HOSPITAL_COMMUNITY): Payer: Medicare Other

## 2014-09-22 ENCOUNTER — Inpatient Hospital Stay (HOSPITAL_COMMUNITY)
Admission: EM | Admit: 2014-09-22 | Discharge: 2014-10-24 | DRG: 870 | Disposition: E | Payer: Medicare Other | Attending: Pulmonary Disease | Admitting: Pulmonary Disease

## 2014-09-22 DIAGNOSIS — Z86010 Personal history of colonic polyps: Secondary | ICD-10-CM | POA: Diagnosis not present

## 2014-09-22 DIAGNOSIS — L03116 Cellulitis of left lower limb: Secondary | ICD-10-CM | POA: Diagnosis not present

## 2014-09-22 DIAGNOSIS — E785 Hyperlipidemia, unspecified: Secondary | ICD-10-CM | POA: Diagnosis present

## 2014-09-22 DIAGNOSIS — Z90710 Acquired absence of both cervix and uterus: Secondary | ICD-10-CM | POA: Diagnosis not present

## 2014-09-22 DIAGNOSIS — I959 Hypotension, unspecified: Secondary | ICD-10-CM

## 2014-09-22 DIAGNOSIS — E872 Acidosis: Secondary | ICD-10-CM | POA: Diagnosis present

## 2014-09-22 DIAGNOSIS — A4151 Sepsis due to Escherichia coli [E. coli]: Secondary | ICD-10-CM | POA: Diagnosis present

## 2014-09-22 DIAGNOSIS — D638 Anemia in other chronic diseases classified elsewhere: Secondary | ICD-10-CM | POA: Diagnosis present

## 2014-09-22 DIAGNOSIS — K8020 Calculus of gallbladder without cholecystitis without obstruction: Secondary | ICD-10-CM | POA: Diagnosis present

## 2014-09-22 DIAGNOSIS — R7881 Bacteremia: Secondary | ICD-10-CM | POA: Diagnosis not present

## 2014-09-22 DIAGNOSIS — L0390 Cellulitis, unspecified: Secondary | ICD-10-CM

## 2014-09-22 DIAGNOSIS — Z4659 Encounter for fitting and adjustment of other gastrointestinal appliance and device: Secondary | ICD-10-CM

## 2014-09-22 DIAGNOSIS — M726 Necrotizing fasciitis: Secondary | ICD-10-CM | POA: Diagnosis present

## 2014-09-22 DIAGNOSIS — Z8673 Personal history of transient ischemic attack (TIA), and cerebral infarction without residual deficits: Secondary | ICD-10-CM

## 2014-09-22 DIAGNOSIS — R6521 Severe sepsis with septic shock: Secondary | ICD-10-CM | POA: Insufficient documentation

## 2014-09-22 DIAGNOSIS — J9690 Respiratory failure, unspecified, unspecified whether with hypoxia or hypercapnia: Secondary | ICD-10-CM

## 2014-09-22 DIAGNOSIS — Z66 Do not resuscitate: Secondary | ICD-10-CM | POA: Diagnosis present

## 2014-09-22 DIAGNOSIS — Z87891 Personal history of nicotine dependence: Secondary | ICD-10-CM | POA: Diagnosis not present

## 2014-09-22 DIAGNOSIS — Z888 Allergy status to other drugs, medicaments and biological substances status: Secondary | ICD-10-CM

## 2014-09-22 DIAGNOSIS — R109 Unspecified abdominal pain: Secondary | ICD-10-CM | POA: Insufficient documentation

## 2014-09-22 DIAGNOSIS — I252 Old myocardial infarction: Secondary | ICD-10-CM | POA: Diagnosis not present

## 2014-09-22 DIAGNOSIS — I340 Nonrheumatic mitral (valve) insufficiency: Secondary | ICD-10-CM | POA: Diagnosis present

## 2014-09-22 DIAGNOSIS — Z823 Family history of stroke: Secondary | ICD-10-CM | POA: Diagnosis not present

## 2014-09-22 DIAGNOSIS — Z6822 Body mass index (BMI) 22.0-22.9, adult: Secondary | ICD-10-CM | POA: Diagnosis not present

## 2014-09-22 DIAGNOSIS — Z8543 Personal history of malignant neoplasm of ovary: Secondary | ICD-10-CM

## 2014-09-22 DIAGNOSIS — E440 Moderate protein-calorie malnutrition: Secondary | ICD-10-CM | POA: Diagnosis present

## 2014-09-22 DIAGNOSIS — E8770 Fluid overload, unspecified: Secondary | ICD-10-CM | POA: Insufficient documentation

## 2014-09-22 DIAGNOSIS — I2510 Atherosclerotic heart disease of native coronary artery without angina pectoris: Secondary | ICD-10-CM | POA: Diagnosis present

## 2014-09-22 DIAGNOSIS — Z951 Presence of aortocoronary bypass graft: Secondary | ICD-10-CM | POA: Diagnosis not present

## 2014-09-22 DIAGNOSIS — J9602 Acute respiratory failure with hypercapnia: Secondary | ICD-10-CM | POA: Diagnosis not present

## 2014-09-22 DIAGNOSIS — I96 Gangrene, not elsewhere classified: Secondary | ICD-10-CM | POA: Diagnosis not present

## 2014-09-22 DIAGNOSIS — Z833 Family history of diabetes mellitus: Secondary | ICD-10-CM

## 2014-09-22 DIAGNOSIS — R0602 Shortness of breath: Secondary | ICD-10-CM

## 2014-09-22 DIAGNOSIS — D696 Thrombocytopenia, unspecified: Secondary | ICD-10-CM | POA: Diagnosis present

## 2014-09-22 DIAGNOSIS — L8990 Pressure ulcer of unspecified site, unspecified stage: Secondary | ICD-10-CM | POA: Insufficient documentation

## 2014-09-22 DIAGNOSIS — J9600 Acute respiratory failure, unspecified whether with hypoxia or hypercapnia: Secondary | ICD-10-CM | POA: Diagnosis not present

## 2014-09-22 DIAGNOSIS — E875 Hyperkalemia: Secondary | ICD-10-CM | POA: Diagnosis present

## 2014-09-22 DIAGNOSIS — Z452 Encounter for adjustment and management of vascular access device: Secondary | ICD-10-CM

## 2014-09-22 DIAGNOSIS — J811 Chronic pulmonary edema: Secondary | ICD-10-CM | POA: Diagnosis present

## 2014-09-22 DIAGNOSIS — R21 Rash and other nonspecific skin eruption: Secondary | ICD-10-CM | POA: Diagnosis present

## 2014-09-22 DIAGNOSIS — Z992 Dependence on renal dialysis: Secondary | ICD-10-CM

## 2014-09-22 DIAGNOSIS — E11649 Type 2 diabetes mellitus with hypoglycemia without coma: Secondary | ICD-10-CM | POA: Diagnosis not present

## 2014-09-22 DIAGNOSIS — Z794 Long term (current) use of insulin: Secondary | ICD-10-CM | POA: Diagnosis not present

## 2014-09-22 DIAGNOSIS — K7460 Unspecified cirrhosis of liver: Secondary | ICD-10-CM | POA: Diagnosis present

## 2014-09-22 DIAGNOSIS — Z7982 Long term (current) use of aspirin: Secondary | ICD-10-CM | POA: Diagnosis not present

## 2014-09-22 DIAGNOSIS — Z9981 Dependence on supplemental oxygen: Secondary | ICD-10-CM | POA: Diagnosis not present

## 2014-09-22 DIAGNOSIS — E871 Hypo-osmolality and hyponatremia: Secondary | ICD-10-CM | POA: Diagnosis not present

## 2014-09-22 DIAGNOSIS — J90 Pleural effusion, not elsewhere classified: Secondary | ICD-10-CM | POA: Diagnosis present

## 2014-09-22 DIAGNOSIS — R17 Unspecified jaundice: Secondary | ICD-10-CM | POA: Diagnosis not present

## 2014-09-22 DIAGNOSIS — E876 Hypokalemia: Secondary | ICD-10-CM | POA: Diagnosis not present

## 2014-09-22 DIAGNOSIS — D72819 Decreased white blood cell count, unspecified: Secondary | ICD-10-CM | POA: Diagnosis present

## 2014-09-22 DIAGNOSIS — G9341 Metabolic encephalopathy: Secondary | ICD-10-CM | POA: Diagnosis present

## 2014-09-22 DIAGNOSIS — I120 Hypertensive chronic kidney disease with stage 5 chronic kidney disease or end stage renal disease: Secondary | ICD-10-CM | POA: Diagnosis present

## 2014-09-22 DIAGNOSIS — E1165 Type 2 diabetes mellitus with hyperglycemia: Secondary | ICD-10-CM | POA: Diagnosis present

## 2014-09-22 DIAGNOSIS — I471 Supraventricular tachycardia: Secondary | ICD-10-CM | POA: Diagnosis not present

## 2014-09-22 DIAGNOSIS — N186 End stage renal disease: Secondary | ICD-10-CM | POA: Diagnosis not present

## 2014-09-22 DIAGNOSIS — J9601 Acute respiratory failure with hypoxia: Secondary | ICD-10-CM | POA: Diagnosis present

## 2014-09-22 DIAGNOSIS — R1010 Upper abdominal pain, unspecified: Secondary | ICD-10-CM | POA: Diagnosis not present

## 2014-09-22 DIAGNOSIS — I9589 Other hypotension: Secondary | ICD-10-CM | POA: Diagnosis not present

## 2014-09-22 DIAGNOSIS — R001 Bradycardia, unspecified: Secondary | ICD-10-CM

## 2014-09-22 DIAGNOSIS — A419 Sepsis, unspecified organism: Secondary | ICD-10-CM | POA: Diagnosis not present

## 2014-09-22 LAB — BLOOD GAS, ARTERIAL
Acid-base deficit: 0.6 mmol/L (ref 0.0–2.0)
Bicarbonate: 22.8 mEq/L (ref 20.0–24.0)
Drawn by: 25788
O2 CONTENT: 2 L/min
O2 Saturation: 93.7 %
PH ART: 7.427 (ref 7.350–7.450)
TCO2: 20.8 mmol/L (ref 0–100)
pCO2 arterial: 35.3 mmHg (ref 35.0–45.0)
pO2, Arterial: 72 mmHg — ABNORMAL LOW (ref 80.0–100.0)

## 2014-09-22 LAB — CBC WITH DIFFERENTIAL/PLATELET
BASOS ABS: 0 10*3/uL (ref 0.0–0.1)
Basophils Relative: 0 % (ref 0–1)
Eosinophils Absolute: 0 10*3/uL (ref 0.0–0.7)
Eosinophils Relative: 0 % (ref 0–5)
HCT: 33 % — ABNORMAL LOW (ref 36.0–46.0)
Hemoglobin: 10.5 g/dL — ABNORMAL LOW (ref 12.0–15.0)
LYMPHS PCT: 8 % — AB (ref 12–46)
Lymphs Abs: 0.3 10*3/uL — ABNORMAL LOW (ref 0.7–4.0)
MCH: 34.5 pg — AB (ref 26.0–34.0)
MCHC: 31.8 g/dL (ref 30.0–36.0)
MCV: 108.6 fL — ABNORMAL HIGH (ref 78.0–100.0)
Monocytes Absolute: 0.1 10*3/uL (ref 0.1–1.0)
Monocytes Relative: 4 % (ref 3–12)
Neutro Abs: 2.8 10*3/uL (ref 1.7–7.7)
Neutrophils Relative %: 88 % — ABNORMAL HIGH (ref 43–77)
Platelets: 127 10*3/uL — ABNORMAL LOW (ref 150–400)
RBC: 3.04 MIL/uL — ABNORMAL LOW (ref 3.87–5.11)
RDW: 14.7 % (ref 11.5–15.5)
WBC: 3.2 10*3/uL — ABNORMAL LOW (ref 4.0–10.5)

## 2014-09-22 LAB — TROPONIN I: Troponin I: 0.1 ng/mL — ABNORMAL HIGH (ref <0.031)

## 2014-09-22 LAB — COMPREHENSIVE METABOLIC PANEL
ALBUMIN: 3 g/dL — AB (ref 3.5–5.0)
ALK PHOS: 94 U/L (ref 38–126)
ALT: 15 U/L (ref 14–54)
AST: 24 U/L (ref 15–41)
Anion gap: 17 — ABNORMAL HIGH (ref 5–15)
BUN: 49 mg/dL — AB (ref 6–20)
CO2: 22 mmol/L (ref 22–32)
CREATININE: 7.81 mg/dL — AB (ref 0.44–1.00)
Calcium: 7.9 mg/dL — ABNORMAL LOW (ref 8.9–10.3)
Chloride: 98 mmol/L — ABNORMAL LOW (ref 101–111)
GFR calc Af Amer: 5 mL/min — ABNORMAL LOW (ref >60)
GFR calc non Af Amer: 5 mL/min — ABNORMAL LOW (ref >60)
Glucose, Bld: 157 mg/dL — ABNORMAL HIGH (ref 65–99)
POTASSIUM: 5.5 mmol/L — AB (ref 3.5–5.1)
Sodium: 137 mmol/L (ref 135–145)
Total Bilirubin: 1.5 mg/dL — ABNORMAL HIGH (ref 0.3–1.2)
Total Protein: 7.3 g/dL (ref 6.5–8.1)

## 2014-09-22 LAB — I-STAT CG4 LACTIC ACID, ED: Lactic Acid, Venous: 3.69 mmol/L (ref 0.5–2.0)

## 2014-09-22 LAB — MRSA PCR SCREENING: MRSA by PCR: NEGATIVE

## 2014-09-22 LAB — GLUCOSE, CAPILLARY: GLUCOSE-CAPILLARY: 144 mg/dL — AB (ref 65–99)

## 2014-09-22 MED ORDER — SODIUM CHLORIDE 0.9 % IV SOLN: 250.0000 mL | INTRAVENOUS | Status: DC | PRN

## 2014-09-22 MED ORDER — FENTANYL CITRATE (PF) 100 MCG/2ML IJ SOLN
25.0000 ug | Freq: Once | INTRAMUSCULAR | Status: AC
  Administered 2014-09-22: 25 ug via INTRAVENOUS

## 2014-09-22 MED ORDER — SODIUM CHLORIDE 0.9 % IV BOLUS (SEPSIS)
250.0000 mL | Freq: Once | INTRAVENOUS | Status: AC
  Administered 2014-09-22: 250 mL via INTRAVENOUS

## 2014-09-22 MED ORDER — FENTANYL CITRATE (PF) 100 MCG/2ML IJ SOLN
INTRAMUSCULAR | Status: AC
  Filled 2014-09-22: qty 2

## 2014-09-22 MED ORDER — ONDANSETRON HCL 4 MG/5ML PO SOLN: 4.0000 mg | Freq: Once | ORAL | Status: DC

## 2014-09-22 MED ORDER — DEXTROSE 5 % IV SOLN
2.2500 g | Freq: Three times a day (TID) | INTRAVENOUS | Status: DC
Start: 2014-09-22 — End: 2014-09-23
  Administered 2014-09-23 (×2): 2.25 g via INTRAVENOUS
  Filled 2014-09-22 (×8): qty 2.25

## 2014-09-22 MED ORDER — HEPARIN SODIUM (PORCINE) 5000 UNIT/ML IJ SOLN
5000.0000 [IU] | Freq: Three times a day (TID) | INTRAMUSCULAR | Status: DC
  Administered 2014-09-22 – 2014-09-26 (×11): 5000 [IU] via SUBCUTANEOUS
  Filled 2014-09-22 (×14): qty 1

## 2014-09-22 MED ORDER — SODIUM CHLORIDE 0.9 % IJ SOLN: 3.0000 mL | INTRAMUSCULAR | Status: DC | PRN

## 2014-09-22 MED ORDER — ONDANSETRON HCL 4 MG/2ML IJ SOLN
4.0000 mg | Freq: Once | INTRAMUSCULAR | Status: AC
  Administered 2014-09-22: 4 mg via INTRAVENOUS
  Filled 2014-09-22: qty 2

## 2014-09-22 MED ORDER — SODIUM CHLORIDE 0.9 % IJ SOLN
3.0000 mL | Freq: Two times a day (BID) | INTRAMUSCULAR | Status: DC
  Administered 2014-09-22 – 2014-09-28 (×10): 3 mL via INTRAVENOUS

## 2014-09-22 MED ORDER — VANCOMYCIN HCL IN DEXTROSE 1-5 GM/200ML-% IV SOLN
1000.0000 mg | Freq: Once | INTRAVENOUS | Status: AC
  Administered 2014-09-22: 1000 mg via INTRAVENOUS
  Filled 2014-09-22: qty 200

## 2014-09-22 NOTE — ED Notes (Signed)
MD Pollina made aware of lactic acid value.

## 2014-09-22 NOTE — ED Notes (Signed)
Having swelling BLE, rash to legs and trunk and husband same pt felt hot at home.  Symptoms started last night.  BP now 76/63.  Pt is HD TTS with fistula to right upper arm.  Pt did not go to HD today but she was weak and possible fever.

## 2014-09-22 NOTE — Progress Notes (Signed)
[facility] Progress Note Patient Name: [name] DOB: [birth year] MRN: [id number]   Date of Service  08/28/2014  HPI/Events of Note  Patient in ucuicu. Camera x 1. D/w APP apPaul Hoffman -   eICU Interventions  Admit orders sent     Intervention Category Intermediate Interventions: Other:  Yuliana Vandrunen 09/12/2014, 10:44 PM

## 2014-09-22 NOTE — Progress Notes (Signed)
ANTIBIOTIC CONSULT NOTE - INITIAL  Pharmacy Consult for Vancomycin & Zosyn Indication: sepsis  Allergies  Allergen Reactions  . Lisinopril Swelling  . Ezetimibe Swelling    ZETIA: Tongue swelling    Patient Measurements: Height: 5\' 4"  (162.6 cm) Weight: 134 lb (60.782 kg) IBW/kg (Calculated) : 54.7 Adjusted Body Weight:   Vital Signs:  Intake/Output from previous day:   Intake/Output from this shift:    Labs:  Recent Labs  09/18/2014 1545  WBC 3.2*  HGB 10.5*  PLT 127*  CREATININE 7.81*   Estimated Creatinine Clearance: 5.8 mL/min (by C-G formula based on Cr of 7.81). No results for input(s): VANCOTROUGH, VANCOPEAK, VANCORANDOM, GENTTROUGH, GENTPEAK, GENTRANDOM, TOBRATROUGH, TOBRAPEAK, TOBRARND, AMIKACINPEAK, AMIKACINTROU, AMIKACIN in the last 72 hours.   Microbiology: Recent Results (from the past 720 hour(s))  Culture, blood (routine x 2)     Status: None (Preliminary result)   Collection Time: 09/14/2014  3:45 PM  Result Value Ref Range Status   Specimen Description BLOOD LEFT HAND DRAWN BY RN BB  Final   Special Requests BOTTLES DRAWN AEROBIC AND ANAEROBIC 5CC EACH  Final   Culture NO GROWTH <12 HOURS  Final   Report Status PENDING  Incomplete  Culture, blood (routine x 2)     Status: None (Preliminary result)   Collection Time: 08/26/2014  4:00 PM  Result Value Ref Range Status   Specimen Description BLOOD LEFT ANTECUBITAL  Final   Special Requests BOTTLES DRAWN AEROBIC AND ANAEROBIC 5CC EACH  Final   Culture NO GROWTH <12 HOURS  Final   Report Status PENDING  Incomplete    Medical History: Past Medical History  Diagnosis Date  . Diabetes mellitus   . Hypertension   . Hyperlipidemia   . Coronary artery disease   . Carpal tunnel syndrome   . Anemia   . Colon polyps 01/2012    Per colonoscopy, Dr. Gala Romney  . Transfusion history 03-04-13    Transfusion x1 unit a month ago-[facility]  . Stroke 2010    2010-"brief weakness,tongue thickness,incoherent"-no  residual affects.  . Mitral regurgitation   . Myocardial infarction 2009  . Peritonitis 06/23/2013  . Ovarian cancer 1995    [name]  . ESRD (end stage renal disease)     Started dialysis in 2009 with hemodialysis and did HD for about 5 years using a R arm AVF.  Changed to PD due to pt likes to travel and is independent.  Her nephrologist is Dr Hinda Lenis in [place], [place].       Medications:   (Not in a hospital admission) Assessment: 69 yo female, dialysis patient Pharmacy protocol Vancomycin and Zosyn  Goal of Therapy:  Vancomycin pre-HD level 15-25 mcg/ml Vancomycin post-HD level 5-15 mcg/ml  Plan:  Vancomycin 1 GM IV x 1 dose, next dose dependent on dialysis schedule Zosyn 2.25 GM IV every 8 hours Labs per protocol  Abner Greenspan, Haeli Gerlich Bennett 09/02/2014,6:24 PM

## 2014-09-22 NOTE — ED Notes (Signed)
Pt heart rate decreased to 40 and BP 82/46. MD Pollina made aware. Pt is alert and oriented.

## 2014-09-22 NOTE — ED Provider Notes (Signed)
CSN: [id number]     Arrival date & time 09/06/2014  1518 History   First MD Initiated Contact with Patient 08/25/2014 1532     Chief Complaint  Patient presents with  . Rash  . Fever  . Leg Swelling     (Consider location/radiation/quality/duration/timing/severity/associated sxs/prior Treatment) HPI Comments: Patient reports onset of fever, chills and generalized weakness last night. Patient reports that she continues to have chills with course of today. She was too weak to go to dialysis today. She normally dialyzes on Tuesday Thursday Saturday. Patient found to be hypotensive in triage. She has not had vomiting or diarrhea. Patient reports that her legs have been read today, thinks she has a rash. There is no pain or itching associated.  Patient is a 69 y.o. female presenting with rash and fever.  Rash Associated symptoms: fatigue and fever   Fever Associated symptoms: chills and rash     Past Medical History  Diagnosis Date  . Diabetes mellitus   . Hypertension   . Hyperlipidemia   . Coronary artery disease   . Carpal tunnel syndrome   . Anemia   . Colon polyps 01/2012    Per colonoscopy, Dr. Gala Romney  . Transfusion history 03-04-13    Transfusion x1 unit a month ago-[facility]  . Stroke 2010    2010-"brief weakness,tongue thickness,incoherent"-no residual affects.  . Mitral regurgitation   . Myocardial infarction 2009  . Peritonitis 06/23/2013  . Ovarian cancer 1995    [name]  . ESRD (end stage renal disease)     Started dialysis in 2009 with hemodialysis and did HD for about 5 years using a R arm AVF.  Changed to PD due to pt likes to travel and is independent.  Her nephrologist is Dr Hinda Lenis in [place], [place].      Past Surgical History  Procedure Laterality Date  . Complete abdominal hysterectomy  1995  . Abdominal hysterectomy    . Colonoscopy with esophagogastroduodenoscopy (egd)  01/27/2012    Dr. Gala Romney. Small hiatal hernia, abnormal second portion  of the duodenum with questionable extrinsic compression. Single left sided colonic diverticulum, 2 colon polyps. Hamartomatous colon polyp  . Eus  04/09/2012    Dr. Owens Loffler: Nonspecific edema/thickening of the periampullary duodenum. Unremarkable biopsy  . Tonsillectomy      child  . Peritoneal catheter insertion  10/30/2012    for peritoneal dialysis  . Back surgery  2009    x3 level fusion-Dr. Louanne Skye  . Cardiac catheterization      x2 stents-'09- Dr. Joellen Jersey  . Esophagogastroduodenoscopy (egd) with propofol N/A 03/11/2013    Dr. Owens Loffler, small duodenal diverticulum. Edematous duodenal mucosa with friability on biopsy (pathology negative)  . Three-vessel cabg, mitral valve annuloplasty, maze  06/17/2013    [facility]  . Coronary artery bypass graft    . Esophagogastroduodenoscopy N/A 07/02/2013    Dr. Enzo Montgomery BLEED DUE TO LARGE DUODENAL ULCER/Small hiatal hernia/PYLORIC CHANNEL ULCER/Multiple CLEANED BASED ulcers in the duodenal bulb and 2nd part of the duodenum. NEGATIVE H.pylori. Elevated gastrin level in setting of Protonix  . Givens capsule study N/A 07/02/2013    not performed.   . Radiology with anesthesia N/A 08/19/2013    Procedure: RADIOLOGY WITH ANESTHESIA;  Surgeon: Rob Hickman, MD;  Location: [facility];  Service: Radiology;  Laterality: N/A;  . Esophagogastroduodenoscopy N/A 03/02/2014    RMR: Previously noted peptic ulcer disease completely heald. Duodenal erosions present. 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 Recent  ultrasound demonstrated cirrhotic appearing liver with an F3-4 Metavir/small stones sludge.   Family History  Problem Relation Age of Onset  . Diabetes Mother   . Heart attack Neg Hx   . Stroke Mother    History  Substance Use Topics  . Smoking status: Former Smoker -- 0.50 packs/day for 15 years    Quit date: 03/25/1958  . Smokeless tobacco: Never Used  . Alcohol Use: No   OB History    No data  available     Review of Systems  Constitutional: Positive for fever, chills and fatigue.  Cardiovascular: Negative.   Gastrointestinal: Negative.   Skin: Positive for rash.  All other systems reviewed and are negative.     Allergies  Lisinopril and Ezetimibe  Home Medications   Prior to Admission medications   Medication Sig Start Date End Date Taking? Authorizing Provider  ALPRAZolam Duanne Moron) 0.5 MG tablet Take 0.5 mg by mouth at bedtime as needed for anxiety.   Yes Historical Provider, MD  aspirin EC 81 MG tablet Take 1 tablet (81 mg total) by mouth daily. 02/14/14  Yes Burnell Blanks, MD  carvedilol (COREG) 3.125 MG tablet Take 1 tablet (3.125 mg total) by mouth 2 (two) times daily with a meal. 09/16/14  Yes Burnell Blanks, MD  EPINEPHrine 0.3 mg/0.3 mL IJ SOAJ injection Inject 0.3 mg into the muscle daily as needed (allergic reaction).   Yes Historical Provider, MD  insulin NPH-regular Human (HUMULIN 70/30) (70-30) 100 UNIT/ML injection Inject 10-20 Units into the skin 2 (two) times daily with a meal. Takes 10 units at bedtime and 20 units in the morning if sugar is above 140   Yes Historical Provider, MD  meclizine (ANTIVERT) 25 MG tablet Take 12.5 mg by mouth 2 (two) times daily as needed for dizziness (Vertigo).    Yes Historical Provider, MD  multivitamin (RENA-VIT) TABS tablet Take 1 tablet by mouth every morning.   Yes Historical Provider, MD  pantoprazole (PROTONIX) 40 MG tablet Take 1 tablet (40 mg total) by mouth 2 (two) times daily. 08/16/14  Yes Mahala Menghini, PA-C  sevelamer carbonate (RENVELA) 800 MG tablet Take 800-2,400 mg by mouth 3 (three) times daily with meals. 2-3 with meals, 1-2 with snacks.   Yes Historical Provider, MD  simvastatin (ZOCOR) 10 MG tablet Take 10 mg by mouth daily. 08/29/14  Yes Historical Provider, MD  Linaclotide Rolan Lipa) 145 MCG CAPS capsule Take 1 capsule (145 mcg total) by mouth daily. On empty stomach. Patient not taking:  Reported on 09/03/2014 04/22/14   Mahala Menghini, PA-C   BP 82/46 mmHg  Pulse 47  Temp(Src) 98.2 F (36.8 C) (Oral)  Resp 11  Ht 5\' 4"  (1.626 m)  Wt 134 lb (60.782 kg)  BMI 22.99 kg/m2  SpO2 90% Physical Exam  Constitutional: She is oriented to person, place, and time. She appears well-developed and well-nourished. No distress.  HENT:  Head: Normocephalic and atraumatic.  Right Ear: Hearing normal.  Left Ear: Hearing normal.  Nose: Nose normal.  Mouth/Throat: Oropharynx is clear and moist and mucous membranes are normal.  Eyes: Conjunctivae and EOM are normal. Pupils are equal, round, and reactive to light.  Neck: Normal range of motion. Neck supple.  Cardiovascular: Regular rhythm, S1 normal and S2 normal.  Exam reveals no gallop and no friction rub.   No murmur heard. Pulmonary/Chest: Effort normal. No respiratory distress. She has decreased breath sounds. She exhibits no tenderness.  Abdominal: Soft. Normal appearance and  bowel sounds are normal. There is no hepatosplenomegaly. There is no tenderness. There is no rebound, no guarding, no tenderness at McBurney's point and negative Murphy's sign. No hernia.  Musculoskeletal: Normal range of motion. She exhibits edema.  Neurological: She is alert and oriented to person, place, and time. She has normal strength. No cranial nerve deficit or sensory deficit. Coordination normal. GCS eye subscore is 4. GCS verbal subscore is 5. GCS motor subscore is 6.  Skin: Skin is warm, dry and intact. No rash noted. No cyanosis.  Psychiatric: She has a normal mood and affect. Her speech is normal and behavior is normal. Thought content normal.  Nursing note and vitals reviewed.   ED Course  Procedures (including critical care time) Labs Review Labs Reviewed  CBC WITH DIFFERENTIAL/PLATELET - Abnormal; Notable for the following:    WBC 3.2 (*)    RBC 3.04 (*)    Hemoglobin 10.5 (*)    HCT 33.0 (*)    MCV 108.6 (*)    MCH 34.5 (*)    Platelets  127 (*)    Neutrophils Relative % 88 (*)    Lymphocytes Relative 8 (*)    Lymphs Abs 0.3 (*)    All other components within normal limits  COMPREHENSIVE METABOLIC PANEL - Abnormal; Notable for the following:    Potassium 5.5 (*)    Chloride 98 (*)    Glucose, Bld 157 (*)    BUN 49 (*)    Creatinine, Ser 7.81 (*)    Calcium 7.9 (*)    Albumin 3.0 (*)    Total Bilirubin 1.5 (*)    GFR calc non Af Amer 5 (*)    GFR calc Af Amer 5 (*)    Anion gap 17 (*)    All other components within normal limits  TROPONIN I - Abnormal; Notable for the following:    Troponin I 0.10 (*)    All other components within normal limits  BLOOD GAS, ARTERIAL - Abnormal; Notable for the following:    pO2, Arterial 72.0 (*)    All other components within normal limits  I-STAT CG4 LACTIC ACID, ED - Abnormal; Notable for the following:    Lactic Acid, Venous 3.69 (*)    All other components within normal limits  CULTURE, BLOOD (ROUTINE X 2)  CULTURE, BLOOD (ROUTINE X 2)    Imaging Review Dg Chest 1 View  09/12/2014   CLINICAL DATA:  Shortness of breath. Bilateral lower extremity swelling.  EXAM: CHEST  1 VIEW  COMPARISON:  PA and lateral chest 01/12/2014.  FINDINGS: There is cardiomegaly and vascular congestion. Small to moderate bilateral pleural effusions and basilar airspace disease, worse on the right, do not appear changed. No pneumothorax is identified.  IMPRESSION: No acute finding.  Chronic small to moderate pleural effusions and basilar airspace disease, worse on the right.  Cardiomegaly and pulmonary vascular congestion.   Electronically Signed   By: Inge Rise M.D.   On: 09/21/2014 16:21     EKG Interpretation   Date/Time:  Thursday September 22 2014 15:29:15 EDT Ventricular Rate:  58 PR Interval:  150 QRS Duration: 84 QT Interval:  484 QTC Calculation: 475 R Axis:   -80 Text Interpretation:  Sinus bradycardia with sinus arrhythmia with  occasional Premature ventricular complexes Left  axis deviation Low voltage  QRS Inferior infarct , age undetermined Possible Anterolateral infarct ,  age undetermined Abnormal ECG No significant change since last tracing  Confirmed by Betsey Holiday  MD, CHRISTOPHER (  [id number]) on 08/25/2014 4:05:48 PM      MDM   Final diagnoses:  Shortness of breath  Hypotension, unspecified hypotension type  Bradycardia    Patient presents to the ER for evaluation of fever and chills which began last night. Patient does not have a fever here in the ER, however. She has been feeling very weak at home. She was so weak that she did not go to dialysis today. Upon arrival to the ER she is found to be hypotensive. She does report that her blood pressure runs low, but she has been anywhere from 49-67 systolic here in the ER. She also has been bradycardic. Heart rate in the 40s to 60s. She does take Coreg, no evidence of overdose.  Patient reports a rash on her legs. She did have some nonspecific mild erythema without obvious morphologic rash present.  Workup reveals evidence of volume overload. She is mildly hypoxic, improves with supplement oxygen. She does report that she uses oxygen at night at home. She does not have any evidence of obvious bronchospasm currently. Hypoxia likely secondary to volume overload. She also has mild hyperkalemia. Troponin is mildly elevated, will need to be cycled. EKG does not show any evidence of obvious ischemia or infarct.  Discussed briefly with Dr. Roderic Palau, was concerned about the patient's hypotension and bradycardia and not tolerating dialysis here well. He asked that I discussed the care plan with critical care. Discussed with Dr. Chase Caller. Patient will be transferred to [facility] ICU under the care of critical care.    Orpah Greek, MD 09/17/2014 1800

## 2014-09-22 NOTE — H&P (Signed)
PULMONARY / CRITICAL CARE MEDICINE   Name: [name] MRN: [id number] DOB: [birth year]    ADMISSION DATE:  09/10/2014 CONSULTATION DATE:  09/04/2014  REFERRING MD :  Leslye Peer Penn  CHIEF COMPLAINT:  fever  INITIAL PRESENTATION: 69 year old female presented to [facility] ED 6/30 complaining of fever and abdominal pain x 1 day. Also with LLE rash. Missed dialysis due to malaise and presented to ED. She was found to be hypotensive and bradycardic. Transferred to [facility] for ICU admission.   STUDIES:    SIGNIFICANT EVENTS:   HISTORY OF PRESENT ILLNESS:  69 year old female with PMH ESRD on HD, DM, HTN, CVA, CAD. She was in her usual state of health, which seems to be minimally dependent on husband, until about 8PM 6/29. She was complaining of malaise and chills. Husband states that she felt warm to the touch and gave her tylenol. At that time he noted her to have a red rash from toe to hip on RLE.  This happened 2 more times overnight. 6/30 AM he brought her to [facility] ED with this complaint. At time of presentation she reported lower abdominal pain and nausea, as well as left leg pain where the rash is. She missed dialysis this morning to go to ED. In ED she was found to be hypotensive and bradycardic, but was felt to be volume overloaded on exam, and was only given a 500cc bolus. Hemodynamics did not improve and she was transferred to [facility] for ICU admission and further eval. Currently she reports lower abdominal pain, and L leg pain. Denies insect bites and changes in detergents, clothes that would account for lower extremity rash.   PAST MEDICAL HISTORY :   has a past medical history of Diabetes mellitus; Hypertension; Hyperlipidemia; Coronary artery disease; Carpal tunnel syndrome; Anemia; Colon polyps (01/2012); Transfusion history (03-04-13); Stroke (2010); Mitral regurgitation; Myocardial infarction (2009); Peritonitis (06/23/2013); Ovarian cancer (1995); and ESRD (end stage renal  disease).  has past surgical history that includes complete abdominal hysterectomy (1995); Abdominal hysterectomy; Colonoscopy with esophagogastroduodenoscopy (egd) (01/27/2012); EUS (04/09/2012); Tonsillectomy; Peritoneal catheter insertion (10/30/2012); Back surgery (2009); Cardiac catheterization; Esophagogastroduodenoscopy (egd) with propofol (N/A, 03/11/2013); Three-vessel CABG, mitral valve annuloplasty, MAZE (06/17/2013); Coronary artery bypass graft; Esophagogastroduodenoscopy (N/A, 07/02/2013); Givens capsule study (N/A, 07/02/2013); Radiology with anesthesia (N/A, 08/19/2013); and Esophagogastroduodenoscopy (N/A, 03/02/2014). Prior to Admission medications   Medication Sig Start Date End Date Taking? Authorizing Provider  ALPRAZolam Duanne Moron) 0.5 MG tablet Take 0.5 mg by mouth at bedtime as needed for anxiety.   Yes Historical Provider, MD  aspirin EC 81 MG tablet Take 1 tablet (81 mg total) by mouth daily. 02/14/14  Yes Burnell Blanks, MD  carvedilol (COREG) 3.125 MG tablet Take 1 tablet (3.125 mg total) by mouth 2 (two) times daily with a meal. 09/16/14  Yes Burnell Blanks, MD  EPINEPHrine 0.3 mg/0.3 mL IJ SOAJ injection Inject 0.3 mg into the muscle daily as needed (allergic reaction).   Yes Historical Provider, MD  insulin NPH-regular Human (HUMULIN 70/30) (70-30) 100 UNIT/ML injection Inject 10-20 Units into the skin 2 (two) times daily with a meal. Takes 10 units at bedtime and 20 units in the morning if sugar is above 140   Yes Historical Provider, MD  meclizine (ANTIVERT) 25 MG tablet Take 12.5 mg by mouth 2 (two) times daily as needed for dizziness (Vertigo).    Yes Historical Provider, MD  multivitamin (RENA-VIT) TABS tablet Take 1 tablet by mouth every  morning.   Yes Historical Provider, MD  pantoprazole (PROTONIX) 40 MG tablet Take 1 tablet (40 mg total) by mouth 2 (two) times daily. 08/16/14  Yes Mahala Menghini, PA-C  sevelamer carbonate (RENVELA) 800 MG tablet Take 800-2,400  mg by mouth 3 (three) times daily with meals. 2-3 with meals, 1-2 with snacks.   Yes Historical Provider, MD  simvastatin (ZOCOR) 10 MG tablet Take 10 mg by mouth daily. 08/29/14  Yes Historical Provider, MD  Linaclotide Rolan Lipa) 145 MCG CAPS capsule Take 1 capsule (145 mcg total) by mouth daily. On empty stomach. Patient not taking: Reported on 09/21/2014 04/22/14   Mahala Menghini, PA-C   Allergies  Allergen Reactions  . Lisinopril Swelling  . Ezetimibe Swelling    ZETIA: Tongue swelling    FAMILY HISTORY:  indicated that her mother is deceased. She indicated that her father is deceased. She indicated that all of her six sisters are alive. She indicated that both of her brothers are alive.  SOCIAL HISTORY:  reports that she quit smoking about 56 years ago. She has never used smokeless tobacco. She reports that she does not drink alcohol or use illicit drugs.  REVIEW OF SYSTEMS:   Bolds are positive  Constitutional: weight loss, gain, night sweats, Fevers, chills, fatigue .  HEENT: headaches, Sore throat, sneezing, nasal congestion, post nasal drip, Difficulty swallowing, Tooth/dental problems, visual complaints visual changes, ear ache CV:  chest pain, radiates: ,Orthopnea, PND, swelling in lower extremities, dizziness, palpitations, syncope.  GI  heartburn, indigestion, abdominal pain, nausea, vomiting, diarrhea, change in bowel habits, loss of appetite, bloody stools.  Resp: cough, productive: , hemoptysis, dyspnea, chest pain, pleuritic.  Skin: painful rash to BLE L > R or itching or icterus GU: dysuria, change in color of urine, urgency or frequency. flank pain, hematuria  MS: joint pain or swelling. decreased range of motion  Psych: change in mood or affect. depression or anxiety.  Neuro: difficulty with speech, weakness, numbness, ataxia    SUBJECTIVE:   VITAL SIGNS: Temp:  [98.2 F (36.8 C)-98.8 F (37.1 C)] 98.8 F (37.1 C) (06/30 2039) Pulse Rate:  [42-61] 50 (06/30  2200) Resp:  [11-28] 28 (06/30 2200) BP: (74-102)/(25-72) 100/33 mmHg (06/30 2200) SpO2:  [90 %-100 %] 98 % (06/30 2200) Weight:  [60.782 kg (134 lb)-62 kg (136 lb 11 oz)] 62 kg (136 lb 11 oz) (06/30 2100) HEMODYNAMICS:   VENTILATOR SETTINGS:   INTAKE / OUTPUT: No intake or output data in the 24 hours ending 09/11/2014 2217  PHYSICAL EXAMINATION: General:  Elderly female in NAD Neuro:  Alert, oriented, non-focal, delirium  HEENT:  [place]/AT, no JVD noted, PERRL Cardiovascular:  RRR, no MRG Lungs:  Clear bilateral breath sounds Abdomen:  Soft, non-tender, non-distended Musculoskeletal:  No acute deformity Skin:  BLE rash. L > R. Dark erythema.   LABS:  CBC  Recent Labs Lab 08/29/2014 1545  WBC 3.2*  HGB 10.5*  HCT 33.0*  PLT 127*   Coag's No results for input(s): APTT, INR in the last 168 hours. BMET  Recent Labs Lab 09/01/2014 1545  NA 137  K 5.5*  CL 98*  CO2 22  BUN 49*  CREATININE 7.81*  GLUCOSE 157*   Electrolytes  Recent Labs Lab 09/06/2014 1545  CALCIUM 7.9*   Sepsis Markers  Recent Labs Lab 09/04/2014 1556  LATICACIDVEN 3.69*   ABG  Recent Labs Lab 09/11/2014 1621  PHART 7.427  PCO2ART 35.3  PO2ART 72.0*   Liver Enzymes  Recent  Labs Lab 09/14/2014 1545  AST 24  ALT 15  ALKPHOS 94  BILITOT 1.5*  ALBUMIN 3.0*   Cardiac Enzymes  Recent Labs Lab 08/31/2014 1545  TROPONINI 0.10*   Glucose  Recent Labs Lab 09/18/2014 2041  GLUCAP 144*    Imaging Dg Chest 1 View  09/10/2014   CLINICAL DATA:  Shortness of breath. Bilateral lower extremity swelling.  EXAM: CHEST  1 VIEW  COMPARISON:  PA and lateral chest 01/12/2014.  FINDINGS: There is cardiomegaly and vascular congestion. Small to moderate bilateral pleural effusions and basilar airspace disease, worse on the right, do not appear changed. No pneumothorax is identified.  IMPRESSION: No acute finding.  Chronic small to moderate pleural effusions and basilar airspace disease, worse on the  right.  Cardiomegaly and pulmonary vascular congestion.   Electronically Signed   By: Inge Rise M.D.   On: 09/18/2014 16:21     ASSESSMENT / PLAN:  PULMONARY OETT 6/30 >>> A: No acute issues  P:   Supplemental O2 PRN [place]   CARDIOVASCULAR CVL 7/1 >>> A:  Shock, suspect septic.  Bradycardia H/o HTN, CAD  P:  MAP goal > 92mm/hg Levophed for MAP goal Insert CVL CVP monitoring Holding outpatient antihypertensives Ensure lactic clearing Trend troponin  RENAL A:   ESRD on HD (TTS) Hyperkalemia  P:   Follow Bmet Correct lytes as indicated Consult nephrology in AM  GASTROINTESTINAL A:   ABD pain, suspect in setting of hypotension  P:   US abdomen NPO IV protonix  HEMATOLOGIC A:   Anemia of chronic illness Thrombocytopenia   P:  Follow CBC Transfuse per ICU guidelines  INFECTIOUS A:   Septic shock, suspect cellulitic source LLE Leukopenia  P:   BCx2 7/1 > Abx: Vancomycin, start date 6/30 Abx: pip/tazo, start date 6/30  ENDOCRINE A:   DM  P:   CBG monitoring and SSI  NEUROLOGIC A:   Acute metabolic encephalopathy  P:   RASS goal: 0 Monitor   FAMILY  - Updates:   - Inter-disciplinary family meet or Palliative Care meeting due by:  09/30/2014   Georgann Housekeeper, AGACNP-BC [facility]/Critical Care Pager [phone] or [phone]  09/23/2014 1:00 AM     PCCM ATTENDING: I have reviewed pt's initial presentation, consultants notes and hospital database in detail.  The above assessment and plan was formulated under my direction.  In summary: ESRD Shock - likely septic Probable BLE cellulitis Abdominal pain is possibly bowel ischemia due to hypotension  Broad spectrum abx as ordered Imaging studies of abdomen as ordered Will need CVL for vasopressors Need to keep close eye on LEs to ensure this is not necrotizing fasciitis (doubt) She appears to be in very poor chronic health - would likely benefit from  goals of care discussion   35 minutes of independent CCM time was provided by me   Merton Border, MD;  PCCM service; Mobile [phone]

## 2014-09-23 ENCOUNTER — Inpatient Hospital Stay (HOSPITAL_COMMUNITY): Payer: Medicare Other

## 2014-09-23 DIAGNOSIS — I959 Hypotension, unspecified: Secondary | ICD-10-CM

## 2014-09-23 DIAGNOSIS — L8990 Pressure ulcer of unspecified site, unspecified stage: Secondary | ICD-10-CM | POA: Insufficient documentation

## 2014-09-23 DIAGNOSIS — E8770 Fluid overload, unspecified: Secondary | ICD-10-CM

## 2014-09-23 DIAGNOSIS — A419 Sepsis, unspecified organism: Secondary | ICD-10-CM

## 2014-09-23 LAB — PROTIME-INR
INR: 1.74 — AB (ref 0.00–1.49)
PROTHROMBIN TIME: 20.3 s — AB (ref 11.6–15.2)

## 2014-09-23 LAB — LACTIC ACID, PLASMA: Lactic Acid, Venous: 3.9 mmol/L (ref 0.5–2.0)

## 2014-09-23 LAB — COMPREHENSIVE METABOLIC PANEL
ALT: 17 U/L (ref 14–54)
ANION GAP: 17 — AB (ref 5–15)
AST: 31 U/L (ref 15–41)
Albumin: 2.8 g/dL — ABNORMAL LOW (ref 3.5–5.0)
Alkaline Phosphatase: 92 U/L (ref 38–126)
BUN: 52 mg/dL — AB (ref 6–20)
CHLORIDE: 99 mmol/L — AB (ref 101–111)
CO2: 22 mmol/L (ref 22–32)
CREATININE: 8.22 mg/dL — AB (ref 0.44–1.00)
Calcium: 8 mg/dL — ABNORMAL LOW (ref 8.9–10.3)
GFR calc Af Amer: 5 mL/min — ABNORMAL LOW (ref >60)
GFR calc non Af Amer: 4 mL/min — ABNORMAL LOW (ref >60)
GLUCOSE: 128 mg/dL — AB (ref 65–99)
Potassium: 6.3 mmol/L (ref 3.5–5.1)
Sodium: 138 mmol/L (ref 135–145)
Total Bilirubin: 2.6 mg/dL — ABNORMAL HIGH (ref 0.3–1.2)
Total Protein: 7.4 g/dL (ref 6.5–8.1)

## 2014-09-23 LAB — CBC WITH DIFFERENTIAL/PLATELET
BASOS PCT: 0 % (ref 0–1)
Basophils Absolute: 0 10*3/uL (ref 0.0–0.1)
EOS ABS: 0 10*3/uL (ref 0.0–0.7)
Eosinophils Relative: 0 % (ref 0–5)
HCT: 33.9 % — ABNORMAL LOW (ref 36.0–46.0)
Hemoglobin: 10.7 g/dL — ABNORMAL LOW (ref 12.0–15.0)
LYMPHS ABS: 0.4 10*3/uL — AB (ref 0.7–4.0)
LYMPHS PCT: 10 % — AB (ref 12–46)
MCH: 33.9 pg (ref 26.0–34.0)
MCHC: 31.6 g/dL (ref 30.0–36.0)
MCV: 107.3 fL — ABNORMAL HIGH (ref 78.0–100.0)
Monocytes Absolute: 0.2 10*3/uL (ref 0.1–1.0)
Monocytes Relative: 5 % (ref 3–12)
NEUTROS ABS: 3 10*3/uL (ref 1.7–7.7)
Neutrophils Relative %: 85 % — ABNORMAL HIGH (ref 43–77)
Platelets: 111 10*3/uL — ABNORMAL LOW (ref 150–400)
RBC: 3.16 MIL/uL — AB (ref 3.87–5.11)
RDW: 14.8 % (ref 11.5–15.5)
WBC: 3.6 10*3/uL — AB (ref 4.0–10.5)

## 2014-09-23 LAB — BASIC METABOLIC PANEL
ANION GAP: 15 (ref 5–15)
BUN: 56 mg/dL — AB (ref 6–20)
CALCIUM: 8.2 mg/dL — AB (ref 8.9–10.3)
CO2: 25 mmol/L (ref 22–32)
CREATININE: 8.55 mg/dL — AB (ref 0.44–1.00)
Chloride: 100 mmol/L — ABNORMAL LOW (ref 101–111)
GFR calc non Af Amer: 4 mL/min — ABNORMAL LOW (ref >60)
GFR, EST AFRICAN AMERICAN: 5 mL/min — AB (ref >60)
GLUCOSE: 198 mg/dL — AB (ref 65–99)
POTASSIUM: 5.3 mmol/L — AB (ref 3.5–5.1)
Sodium: 140 mmol/L (ref 135–145)

## 2014-09-23 LAB — GLUCOSE, CAPILLARY
GLUCOSE-CAPILLARY: 113 mg/dL — AB (ref 65–99)
GLUCOSE-CAPILLARY: 76 mg/dL (ref 65–99)
GLUCOSE-CAPILLARY: 43 mg/dL — AB (ref 65–99)
GLUCOSE-CAPILLARY: 58 mg/dL — AB (ref 65–99)
GLUCOSE-CAPILLARY: 96 mg/dL (ref 65–99)
GLUCOSE-CAPILLARY: 77 mg/dL (ref 65–99)
Glucose-Capillary: 193 mg/dL — ABNORMAL HIGH (ref 65–99)
Glucose-Capillary: 85 mg/dL (ref 65–99)
Glucose-Capillary: 84 mg/dL (ref 65–99)

## 2014-09-23 LAB — CBC
HCT: 32.6 % — ABNORMAL LOW (ref 36.0–46.0)
Hemoglobin: 10.2 g/dL — ABNORMAL LOW (ref 12.0–15.0)
MCH: 33.8 pg (ref 26.0–34.0)
MCHC: 31.3 g/dL (ref 30.0–36.0)
MCV: 107.9 fL — ABNORMAL HIGH (ref 78.0–100.0)
Platelets: 100 10*3/uL — ABNORMAL LOW (ref 150–400)
RBC: 3.02 MIL/uL — ABNORMAL LOW (ref 3.87–5.11)
RDW: 14.9 % (ref 11.5–15.5)
WBC: 3.6 10*3/uL — ABNORMAL LOW (ref 4.0–10.5)

## 2014-09-23 LAB — MAGNESIUM
MAGNESIUM: 1.8 mg/dL (ref 1.7–2.4)
Magnesium: 1.6 mg/dL — ABNORMAL LOW (ref 1.7–2.4)

## 2014-09-23 LAB — RENAL FUNCTION PANEL
ALBUMIN: 2.5 g/dL — AB (ref 3.5–5.0)
ANION GAP: 16 — AB (ref 5–15)
BUN: 65 mg/dL — ABNORMAL HIGH (ref 6–20)
CALCIUM: 7.9 mg/dL — AB (ref 8.9–10.3)
CHLORIDE: 100 mmol/L — AB (ref 101–111)
CO2: 24 mmol/L (ref 22–32)
Creatinine, Ser: 8.62 mg/dL — ABNORMAL HIGH (ref 0.44–1.00)
GFR calc Af Amer: 5 mL/min — ABNORMAL LOW (ref >60)
GFR calc non Af Amer: 4 mL/min — ABNORMAL LOW (ref >60)
GLUCOSE: 84 mg/dL (ref 65–99)
POTASSIUM: 5.6 mmol/L — AB (ref 3.5–5.1)
Phosphorus: 8.9 mg/dL — ABNORMAL HIGH (ref 2.5–4.6)
Sodium: 140 mmol/L (ref 135–145)

## 2014-09-23 LAB — PROCALCITONIN: PROCALCITONIN: 31.16 ng/mL

## 2014-09-23 LAB — BRAIN NATRIURETIC PEPTIDE: B NATRIURETIC PEPTIDE 5: 1031.4 pg/mL — AB (ref 0.0–100.0)

## 2014-09-23 LAB — TROPONIN I
TROPONIN I: 0.11 ng/mL — AB (ref <0.031)
Troponin I: 0.12 ng/mL — ABNORMAL HIGH (ref <0.031)
Troponin I: 0.17 ng/mL — ABNORMAL HIGH (ref <0.031)

## 2014-09-23 LAB — LIPASE, BLOOD: LIPASE: 38 U/L (ref 22–51)

## 2014-09-23 LAB — AMYLASE: Amylase: 41 U/L (ref 28–100)

## 2014-09-23 LAB — PHOSPHORUS
Phosphorus: 7.4 mg/dL — ABNORMAL HIGH (ref 2.5–4.6)
Phosphorus: 6.9 mg/dL — ABNORMAL HIGH (ref 2.5–4.6)

## 2014-09-23 LAB — APTT: aPTT: 34 seconds (ref 24–37)

## 2014-09-23 LAB — CORTISOL: Cortisol, Plasma: >60 ug/dL

## 2014-09-23 MED ORDER — PRISMASOL BGK 4/2.5 32-4-2.5 MEQ/L IV SOLN
INTRAVENOUS | Status: DC
  Administered 2014-09-23 – 2014-09-28 (×34): via INTRAVENOUS_CENTRAL
  Filled 2014-09-23 (×52): qty 5000

## 2014-09-23 MED ORDER — NOREPINEPHRINE BITARTRATE 1 MG/ML IV SOLN
2.0000 ug/min | INTRAVENOUS | Status: DC
  Administered 2014-09-23: 2 ug/min via INTRAVENOUS
  Filled 2014-09-23: qty 4

## 2014-09-23 MED ORDER — INSULIN ASPART 100 UNIT/ML ~~LOC~~ SOLN
2.0000 [IU] | SUBCUTANEOUS | Status: DC
  Administered 2014-09-23: 4 [IU] via SUBCUTANEOUS
  Administered 2014-09-24 – 2014-09-25 (×2): 2 [IU] via SUBCUTANEOUS
  Administered 2014-09-25 (×3): 4 [IU] via SUBCUTANEOUS
  Administered 2014-09-26: 6 [IU] via SUBCUTANEOUS
  Administered 2014-09-26: 4 [IU] via SUBCUTANEOUS
  Administered 2014-09-26: 2 [IU] via SUBCUTANEOUS
  Administered 2014-09-26 (×3): 6 [IU] via SUBCUTANEOUS

## 2014-09-23 MED ORDER — DEXTROSE 50 % IV SOLN
INTRAVENOUS | Status: AC
  Administered 2014-09-23: 25 mL
  Filled 2014-09-23: qty 50

## 2014-09-23 MED ORDER — VANCOMYCIN HCL 500 MG IV SOLR
500.0000 mg | INTRAVENOUS | Status: DC
  Administered 2014-09-24 – 2014-09-25 (×2): 500 mg via INTRAVENOUS
  Filled 2014-09-23 (×2): qty 500

## 2014-09-23 MED ORDER — NOREPINEPHRINE BITARTRATE 1 MG/ML IV SOLN
2.0000 ug/min | INTRAVENOUS | Status: DC
  Administered 2014-09-23: 38 ug/min via INTRAVENOUS
  Administered 2014-09-23: 45 ug/min via INTRAVENOUS
  Administered 2014-09-23: 20 ug/min via INTRAVENOUS
  Administered 2014-09-24: 18 ug/min via INTRAVENOUS
  Filled 2014-09-23 (×4): qty 16

## 2014-09-23 MED ORDER — SODIUM CHLORIDE 0.9 % IV SOLN
1.0000 g | Freq: Once | INTRAVENOUS | Status: AC
  Administered 2014-09-23: 1 g via INTRAVENOUS
  Filled 2014-09-23: qty 10

## 2014-09-23 MED ORDER — HEPARIN SODIUM (PORCINE) 1000 UNIT/ML DIALYSIS
1000.0000 [IU] | INTRAMUSCULAR | Status: DC | PRN
  Filled 2014-09-23: qty 6

## 2014-09-23 MED ORDER — ACETAMINOPHEN 650 MG RE SUPP
650.0000 mg | Freq: Four times a day (QID) | RECTAL | Status: DC | PRN
  Administered 2014-09-23: 650 mg via RECTAL
  Filled 2014-09-23: qty 1

## 2014-09-23 MED ORDER — DEXTROSE-NACL 5-0.45 % IV SOLN
INTRAVENOUS | Status: DC
  Administered 2014-09-23: 20:00:00 via INTRAVENOUS

## 2014-09-23 MED ORDER — FENTANYL CITRATE (PF) 100 MCG/2ML IJ SOLN
25.0000 ug | INTRAMUSCULAR | Status: DC | PRN
  Administered 2014-09-23 – 2014-09-27 (×8): 25 ug via INTRAVENOUS
  Filled 2014-09-23 (×9): qty 2

## 2014-09-23 MED ORDER — VASOPRESSIN 20 UNIT/ML IV SOLN
0.0300 [IU]/min | INTRAVENOUS | Status: DC
  Administered 2014-09-24 – 2014-09-29 (×5): 0.03 [IU]/min via INTRAVENOUS
  Filled 2014-09-23 (×6): qty 2

## 2014-09-23 MED ORDER — SODIUM POLYSTYRENE SULFONATE 15 GM/60ML PO SUSP
30.0000 g | Freq: Once | ORAL | Status: AC
  Administered 2014-09-23: 30 g via RECTAL
  Filled 2014-09-23: qty 120

## 2014-09-23 MED ORDER — HEPARIN (PORCINE) 2000 UNITS/L FOR CRRT
INTRAVENOUS_CENTRAL | Status: DC | PRN
  Filled 2014-09-23: qty 1000

## 2014-09-23 MED ORDER — PIPERACILLIN SOD-TAZOBACTAM SO 2.25 (2-0.25) G IV SOLR
2.2500 g | Freq: Four times a day (QID) | INTRAVENOUS | Status: DC
  Administered 2014-09-23 – 2014-09-26 (×11): 2.25 g via INTRAVENOUS
  Filled 2014-09-23 (×13): qty 2.25

## 2014-09-23 MED ORDER — PRISMASOL BGK 0/2.5 32-2.5 MEQ/L IV SOLN
INTRAVENOUS | Status: DC
  Administered 2014-09-23 – 2014-09-27 (×7): via INTRAVENOUS_CENTRAL
  Filled 2014-09-23 (×15): qty 5000

## 2014-09-23 MED ORDER — DEXTROSE 50 % IV SOLN
1.0000 | Freq: Once | INTRAVENOUS | Status: AC
  Administered 2014-09-23: 50 mL via INTRAVENOUS
  Filled 2014-09-23 (×2): qty 50

## 2014-09-23 MED ORDER — PRISMASOL BGK 0/2.5 32-2.5 MEQ/L IV SOLN
INTRAVENOUS | Status: DC
  Administered 2014-09-23 – 2014-09-27 (×7): via INTRAVENOUS_CENTRAL
  Filled 2014-09-23 (×12): qty 5000

## 2014-09-23 MED ORDER — CETYLPYRIDINIUM CHLORIDE 0.05 % MT LIQD: 7.0000 mL | Freq: Two times a day (BID) | OROMUCOSAL | Status: DC

## 2014-09-23 MED ORDER — SODIUM BICARBONATE 8.4 % IV SOLN
100.0000 meq | Freq: Once | INTRAVENOUS | Status: AC
  Administered 2014-09-23: 100 meq via INTRAVENOUS
  Filled 2014-09-23 (×2): qty 100

## 2014-09-23 MED ORDER — HEPARIN SODIUM (PORCINE) 1000 UNIT/ML DIALYSIS
1000.0000 [IU] | INTRAMUSCULAR | Status: DC | PRN
  Administered 2014-09-23: 3200 [IU] via INTRAVENOUS_CENTRAL
  Filled 2014-09-23: qty 6
  Filled 2014-09-23: qty 3
  Filled 2014-09-23: qty 6
  Filled 2014-09-23: qty 1

## 2014-09-23 MED ORDER — INSULIN ASPART 100 UNIT/ML IV SOLN
10.0000 [IU] | Freq: Once | INTRAVENOUS | Status: AC
  Administered 2014-09-23: 10 [IU] via INTRAVENOUS

## 2014-09-23 MED ORDER — HYDROMORPHONE HCL 1 MG/ML IJ SOLN
1.0000 mg | INTRAMUSCULAR | Status: AC
Start: 2014-09-23 — End: 2014-09-23
  Administered 2014-09-23: 1 mg via INTRAVENOUS
  Filled 2014-09-23: qty 1

## 2014-09-23 MED ORDER — CHLORHEXIDINE GLUCONATE 0.12 % MT SOLN
15.0000 mL | Freq: Two times a day (BID) | OROMUCOSAL | Status: DC
  Administered 2014-09-23: 15 mL via OROMUCOSAL
  Filled 2014-09-23: qty 15

## 2014-09-23 NOTE — Progress Notes (Addendum)
CRITICAL VALUE ALERT  Critical value received:  Potassium 6.3 & Lactic Acid 3.9  Date of notification:  09/23/14  Time of notification:  0305  Critical value read back: YES  Nurse who received alert:  Rito Ehrlich, RN  MD notified (1st page):  ELINK  Time of first page:  0306  MD notified (2nd page): ELINK  Time of second [id number]  Responding MD:  Dr Oletta Darter  Time MD responded:  [phone]

## 2014-09-23 NOTE — Procedures (Signed)
RT unable to obtain ABG x 2 and Charge RT unable to obtain ABG x 1.  Georgann Housekeeper, NP notifited.

## 2014-09-23 NOTE — Procedures (Signed)
Central Venous dialysis Catheter Insertion Procedure Note [name] [id number] 1945/05/26  Procedure: Insertion of Central Venous Catheter Indications: Assessment of intravascular volume, Drug and/or fluid administration and Frequent blood sampling  Procedure Details Consent: Risks of procedure as well as the alternatives and risks of each were explained to the (patient/caregiver).  Consent for procedure obtained. Time Out: Verified patient identification, verified procedure, site/side was marked, verified correct patient position, special equipment/implants available, medications/allergies/relevent history reviewed, required imaging and test results available.  Performed  Maximum sterile technique was used including antiseptics, cap, gloves, gown, hand hygiene, mask and sheet. Skin prep: Chlorhexidine; local anesthetic administered A antimicrobial bonded/coated triple lumen catheter was placed in the right internal jugular vein using the Seldinger technique.  Evaluation Blood flow good; but seemed to hit obstruction at > 10 cm. Excellent blood flow from all three ports at 10 cm.  Complications: No apparent complications Patient did tolerate procedure well. Chest X-ray ordered to verify placement.  CXR: pending.  BABCOCK,PETE 09/23/2014, 11:54 AM  Baltazar Apo, MD, PhD 09/23/2014, 4:04 PM [facility] [phone] or if no answer [phone]

## 2014-09-23 NOTE — Progress Notes (Signed)
[facility] Progress Note Patient Name: [name] DOB: July 10, 1945 MRN: [id number]   Date of Service  09/23/2014  HPI/Events of Note  Fever to 101.6 F. Already on Antibiotics.  eICU Interventions  Will order Tylenol Suppository 650 mg PR PRN Temp > 101.5 F.     Intervention Category Minor Interventions: Routine modifications to care plan (e.g. PRN medications for pain, fever)  Sommer,Steven Eugene 09/23/2014, 5:35 AM

## 2014-09-23 NOTE — Progress Notes (Signed)
[place] Progress Note Patient Name: [name] DOB: [birth year] MRN: [id number]   Date of Service  09/23/2014  HPI/Events of Note  K+ = 5.5 >> 6.3. ESRD.  eICU Interventions  Will order: 1. D50/Insulin 2. Calcium Gluconate. 3. NaHCO3 100 meq IV now. 4. Kayexalate 30 gm PR now (patient NPO).     Intervention Category Major Interventions: Electrolyte abnormality - evaluation and management  Sommer,Steven Eugene 09/23/2014, 3:36 AM

## 2014-09-23 NOTE — Progress Notes (Signed)
CRITICAL VALUE ALERT  Critical value received:  CBG 43 recheck 58  Date of notification:  09/23/14  Time of notification:  1200  Critical value read back: yes  Nurse who received alert:  Birder Robson RN  MD notified (1st page):  Dr. Lamonte Sakai on unit and notified verbally. Hypoglycemia protocol intiated. 25cc of dextrose administered and cbg rechecked in 15 min with result of 96.

## 2014-09-23 NOTE — Progress Notes (Signed)
[place] Progress Note Patient Name: [name] DOB: Aug 06, 1945 MRN: [id number]  Date of Service  09/23/2014   HPI/Events of Note   hypoglycemia  eICU Interventions  Start d5 halfnormal at [facility]   Indications: moderate, hypoglycemia  [name] 09/23/2014, 7:31 PM;

## 2014-09-23 NOTE — Progress Notes (Signed)
ANTIBIOTIC CONSULT NOTE - FOLLOW UP  Pharmacy Consult for Zosyn and vancomycin Indication: cellulitis/sepsis  Allergies  Allergen Reactions  . Lisinopril Swelling  . Ezetimibe Swelling    ZETIA: Tongue swelling    Patient Measurements: Height: 5\' 4"  (162.6 cm) Weight: 138 lb 10.7 oz (62.9 kg) IBW/kg (Calculated) : 54.7  Vital Signs: Temp: 98.1 F (36.7 C) (07/01 0741) Temp Source: Oral (07/01 0741) BP: 95/39 mmHg (07/01 1400) Pulse Rate: 108 (07/01 1400) Intake/Output from previous day: 06/30 0701 - 07/01 0700 In: 541.5 [I.V.:381.5; IV Piggyback:160] Out: 1 [Stool:1] Intake/Output from this shift: Total I/O In: 283.9 [I.V.:233.9; IV Piggyback:50] Out: -   Labs:  Recent Labs  09/21/2014 1545 09/23/14 0140 09/23/14 0500  WBC 3.2* 3.6* 3.6*  HGB 10.5* 10.7* 10.2*  PLT 127* 111* 100*  CREATININE 7.81* 8.22* 8.55*   Estimated Creatinine Clearance: 5.3 mL/min (by C-G formula based on Cr of 8.55). No results for input(s): VANCOTROUGH, VANCOPEAK, VANCORANDOM, GENTTROUGH, GENTPEAK, GENTRANDOM, TOBRATROUGH, TOBRAPEAK, TOBRARND, AMIKACINPEAK, AMIKACINTROU, AMIKACIN in the last 72 hours.   Microbiology: Recent Results (from the past 720 hour(s))  Culture, blood (routine x 2)     Status: None (Preliminary result)   Collection Time: 09/06/2014  3:45 PM  Result Value Ref Range Status   Specimen Description BLOOD LEFT HAND DRAWN BY RN BB  Final   Special Requests BOTTLES DRAWN AEROBIC AND ANAEROBIC 5CC EACH  Final   Culture  Setup Time   Final    GRAM NEGATIVE RODS ANAEROBIC BOTTLE ONLY CRITICAL RESULT CALLED TO, READ BACK BY AND VERIFIED WITH: Vivianne Spence AT [facility] AT [place] [address] BY FORSYTH K Performed at [facility] Performed at [facility]    Culture PENDING  Incomplete   Report Status PENDING  Incomplete  Culture, blood (routine x 2)     Status: None (Preliminary result)   Collection Time: 08/24/2014  4:00 PM  Result Value Ref Range Status   Specimen  Description BLOOD LEFT ANTECUBITAL  Final   Special Requests BOTTLES DRAWN AEROBIC AND ANAEROBIC 5CC EACH  Final   Culture  Setup Time   Final    GRAM NEGATIVE RODS IN BOTH AEROBIC AND ANAEROBIC BOTTLES CRITICAL RESULT CALLED TO, READ BACK BY AND VERIFIED WITH: MOORE K AT [place] ON [address] BY FORSYTH K Performed at [facility]    Culture PENDING  Incomplete   Report Status PENDING  Incomplete  MRSA PCR Screening     Status: None   Collection Time: 09/07/2014  8:38 PM  Result Value Ref Range Status   MRSA by PCR NEGATIVE NEGATIVE Final    Comment:        The GeneXpert MRSA Assay (FDA approved for NASAL specimens only), is one component of a comprehensive MRSA colonization surveillance program. It is not intended to diagnose MRSA infection nor to guide or monitor treatment for MRSA infections.   Culture, blood (routine x 2)     Status: None (Preliminary result)   Collection Time: 09/23/14 12:47 AM  Result Value Ref Range Status   Specimen Description BLOOD LEFT HAND  Final   Special Requests IN PEDIATRIC BOTTLE 2CC  Final   Culture  Setup Time   Final    GRAM NEGATIVE RODS AEROBIC BOTTLE ONLY CRITICAL RESULT CALLED TO, READ BACK BY AND VERIFIED WITH: I BAYNES 09/23/14 @ 69 M VESTAL    Culture GRAM NEGATIVE RODS  Final   Report Status PENDING  Incomplete  Culture, blood (routine  x 2)     Status: None (Preliminary result)   Collection Time: 09/23/14  1:40 AM  Result Value Ref Range Status   Specimen Description BLOOD CENTRAL LINE  Final   Special Requests BOTTLES DRAWN AEROBIC AND ANAEROBIC 10CC EACH  Final   Culture  Setup Time   Final    GRAM NEGATIVE RODS IN BOTH AEROBIC AND ANAEROBIC BOTTLES CRITICAL RESULT CALLED TO, READ BACK BY AND VERIFIED WITH: A. BAYNES, RN AT 1013 ON [id number] BY Rhea Bleacher    Culture PENDING  Incomplete   Report Status PENDING  Incomplete    Anti-infectives    Start     Dose/Rate Route Frequency Ordered Stop   09/24/14 0800   vancomycin (VANCOCIN) 500 mg in sodium chloride 0.9 % 100 mL IVPB     500 mg 100 mL/hr over 60 Minutes Intravenous Every 24 hours 09/23/14 1453     09/23/14 1800  piperacillin-tazobactam (ZOSYN) 2.25 g in dextrose 5 % 50 mL IVPB     2.25 g 100 mL/hr over 30 Minutes Intravenous 4 times per day 09/23/14 1453     09/01/2014 1900  piperacillin-tazobactam (ZOSYN) 2.25 g in dextrose 5 % 50 mL IVPB  Status:  Discontinued     2.25 g 100 mL/hr over 30 Minutes Intravenous Every 8 hours 09/15/2014 1823 09/23/14 1453   09/13/2014 1830  vancomycin (VANCOCIN) IVPB 1000 mg/200 mL premix     1,000 mg 200 mL/hr over 60 Minutes Intravenous  Once 09/09/2014 1823 09/11/2014 1940      Assessment: 69 year old woman admitted sepsis/cellulitis found to have Gram-negative rods in 4 blood cultures.   CRRT starting.  Need to adjust vancomycin and Zosyn for CRRT.  Goal of Therapy:  Vancomycin trough level 15-20 mcg/ml  Plan:  Measure antibiotic drug levels at steady state Follow up culture results Zosyn 2.25g IV q6h  Vancomycin 500mg  IV q24h  Candie Mile 09/23/2014,3:00 PM

## 2014-09-23 NOTE — Procedures (Signed)
Central Venous Catheter Insertion Procedure Note AANYAH LOA [id number] September 03, 1945  Procedure: Insertion of Central Venous Catheter Indications: Assessment of intravascular volume, Drug and/or fluid administration and Frequent blood sampling  Procedure Details Consent: Risks of procedure as well as the alternatives and risks of each were explained to the (patient/caregiver).  Consent for procedure obtained. Time Out: Verified patient identification, verified procedure, site/side was marked, verified correct patient position, special equipment/implants available, medications/allergies/relevent history reviewed, required imaging and test results available.  Performed  Maximum sterile technique was used including antiseptics, cap, gloves, gown, hand hygiene, mask and sheet. Skin prep: Chlorhexidine; local anesthetic administered A antimicrobial bonded/coated triple lumen catheter was placed in the right internal jugular vein using the Seldinger technique. Ultrasound guidance used.Yes.   Catheter placed to 15 cm. Blood aspirated via all 3 ports and then flushed x 3. Line sutured x 2 and dressing applied.  Evaluation Blood flow good Complications: No apparent complications Patient did tolerate procedure well. Chest X-ray ordered to verify placement.  CXR: pending.  Georgann Housekeeper, AGACNP-BC [facility] Pulmonology/Critical Care Pager [phone] or [phone]  09/23/2014 1:46 AM     Merton Border, MD ; [facility] service Mobile [phone].  After 5:30 PM or weekends, call [phone]

## 2014-09-23 NOTE — Progress Notes (Signed)
[facility] Physician Progress Note and Electrolyte Replacement  Patient Name: BRYNLYNN WALKO DOB: Sep 23, 1945 MRN: [id number]    Date of Service  09/23/2014   HPI/Events of Note   RN says leg area of rash is burning and c/o lot of pain despite fentanyl 73mcg  eICU Interventions  Try rotation to dilaudid 1mg  x 1 IV x 1   Intervention Category Major Interventions: Electrolyte abnormality - evaluation and management Intermediate Interventions: Diagnostic test evaluation Minor Interventions: Routine modifications to care plan (e.g. PRN medications for pain, fever)  Cinnamon Morency 09/23/2014, 4:57 PM   ASSESSMENT: MAJOR ELECTROLYTE      Dr. Brand Males, M.D., [facility].C.P Pulmonary and Critical Care Medicine Staff Physician [place] [facility] Pager: [phone], If no answer or between  15:00h - 7:00h: call [phone]  09/23/2014 4:57 PM

## 2014-09-23 NOTE — H&P (Signed)
PULMONARY / CRITICAL CARE MEDICINE   Name: [name] MRN: [id number] DOB: Mar 15, 1946    ADMISSION DATE:  09/12/2014 CONSULTATION DATE:  09/06/2014  REFERRING MD :  Leslye Peer Penn  CHIEF COMPLAINT:  fever  INITIAL PRESENTATION: 69 year old female presented to [facility] ED 6/30 complaining of fever and abdominal pain x 1 day. Also with LLE rash. Missed dialysis due to malaise and presented to ED. She was found to be hypotensive and bradycardic. Transferred to [facility] for ICU admission.   STUDIES:    SIGNIFICANT EVENTS:   HISTORY OF PRESENT ILLNESS:  69 year old female with PMH ESRD on HD, DM, HTN, CVA, CAD. She was in her usual state of health, which seems to be minimally dependent on husband, until about 8PM 6/29. She was complaining of malaise and chills. Husband states that she felt warm to the touch and gave her tylenol. At that time he noted her to have a red rash from toe to hip on RLE.  This happened 2 more times overnight. 6/30 AM he brought her to [facility] ED with this complaint. At time of presentation she reported lower abdominal pain and nausea, as well as left leg pain where the rash is. She missed dialysis this morning to go to ED. In ED she was found to be hypotensive and bradycardic, but was felt to be volume overloaded on exam, and was only given a 500cc bolus. Hemodynamics did not improve and she was transferred to [facility] for ICU admission and further eval. Currently she reports lower abdominal pain, and L leg pain. Denies insect bites and changes in detergents, clothes that would account for lower extremity rash.   PAST MEDICAL HISTORY :   has a past medical history of Diabetes mellitus; Hypertension; Hyperlipidemia; Coronary artery disease; Carpal tunnel syndrome; Anemia; Colon polyps (01/2012); Transfusion history (03-04-13); Stroke (2010); Mitral regurgitation; Myocardial infarction (2009); Peritonitis (06/23/2013); Ovarian cancer (1995); and ESRD (end stage renal  disease).  has past surgical history that includes complete abdominal hysterectomy (1995); Abdominal hysterectomy; Colonoscopy with esophagogastroduodenoscopy (egd) (01/27/2012); EUS (04/09/2012); Tonsillectomy; Peritoneal catheter insertion (10/30/2012); Back surgery (2009); Cardiac catheterization; Esophagogastroduodenoscopy (egd) with propofol (N/A, 03/11/2013); Three-vessel CABG, mitral valve annuloplasty, MAZE (06/17/2013); Coronary artery bypass graft; Esophagogastroduodenoscopy (N/A, 07/02/2013); Givens capsule study (N/A, 07/02/2013); Radiology with anesthesia (N/A, 08/19/2013); and Esophagogastroduodenoscopy (N/A, 03/02/2014). Prior to Admission medications   Medication Sig Start Date End Date Taking? Authorizing Provider  ALPRAZolam Duanne Moron) 0.5 MG tablet Take 0.5 mg by mouth at bedtime as needed for anxiety.   Yes Historical Provider, MD  aspirin EC 81 MG tablet Take 1 tablet (81 mg total) by mouth daily. 02/14/14  Yes Burnell Blanks, MD  carvedilol (COREG) 3.125 MG tablet Take 1 tablet (3.125 mg total) by mouth 2 (two) times daily with a meal. 09/16/14  Yes Burnell Blanks, MD  EPINEPHrine 0.3 mg/0.3 mL IJ SOAJ injection Inject 0.3 mg into the muscle daily as needed (allergic reaction).   Yes Historical Provider, MD  insulin NPH-regular Human (HUMULIN 70/30) (70-30) 100 UNIT/ML injection Inject 10-20 Units into the skin 2 (two) times daily with a meal. Takes 10 units at bedtime and 20 units in the morning if sugar is above 140   Yes Historical Provider, MD  meclizine (ANTIVERT) 25 MG tablet Take 12.5 mg by mouth 2 (two) times daily as needed for dizziness (Vertigo).    Yes Historical Provider, MD  multivitamin (RENA-VIT) TABS tablet Take 1 tablet by mouth every  morning.   Yes Historical Provider, MD  pantoprazole (PROTONIX) 40 MG tablet Take 1 tablet (40 mg total) by mouth 2 (two) times daily. 08/16/14  Yes Mahala Menghini, PA-C  sevelamer carbonate (RENVELA) 800 MG tablet Take 800-2,400  mg by mouth 3 (three) times daily with meals. 2-3 with meals, 1-2 with snacks.   Yes Historical Provider, MD  simvastatin (ZOCOR) 10 MG tablet Take 10 mg by mouth daily. 08/29/14  Yes Historical Provider, MD  Linaclotide Rolan Lipa) 145 MCG CAPS capsule Take 1 capsule (145 mcg total) by mouth daily. On empty stomach. Patient not taking: Reported on 09/04/2014 04/22/14   Mahala Menghini, PA-C   Allergies  Allergen Reactions  . Lisinopril Swelling  . Ezetimibe Swelling    ZETIA: Tongue swelling    FAMILY HISTORY:  indicated that her mother is deceased. She indicated that her father is deceased. She indicated that all of her six sisters are alive. She indicated that both of her brothers are alive.  SOCIAL HISTORY:  reports that she quit smoking about 56 years ago. She has never used smokeless tobacco. She reports that she does not drink alcohol or use illicit drugs.  REVIEW OF SYSTEMS:   Bolds are positive  Constitutional: weight loss, gain, night sweats, Fevers, chills, fatigue .  HEENT: headaches, Sore throat, sneezing, nasal congestion, post nasal drip, Difficulty swallowing, Tooth/dental problems, visual complaints visual changes, ear ache CV:  chest pain, radiates: ,Orthopnea, PND, swelling in lower extremities, dizziness, palpitations, syncope.  GI  heartburn, indigestion, abdominal pain, nausea, vomiting, diarrhea, change in bowel habits, loss of appetite, bloody stools.  Resp: cough, productive: , hemoptysis, dyspnea, chest pain, pleuritic.  Skin: painful rash to BLE L > R or itching or icterus GU: dysuria, change in color of urine, urgency or frequency. flank pain, hematuria  MS: joint pain or swelling. decreased range of motion  Psych: change in mood or affect. depression or anxiety.  Neuro: difficulty with speech, weakness, numbness, ataxia    SUBJECTIVE:   VITAL SIGNS: Temp:  [97.5 F (36.4 C)-100.6 F (38.1 C)] 97.5 F (36.4 C) (07/01 1605) Pulse Rate:  [42-109] 108 (07/01  1500) Resp:  [11-32] 27 (07/01 1500) BP: (69-128)/(15-72) 104/41 mmHg (07/01 1500) SpO2:  [90 %-100 %] 98 % (07/01 1500) FiO2 (%):  [0 %] 0 % (06/30 2242) Weight:  [62 kg (136 lb 11 oz)-62.9 kg (138 lb 10.7 oz)] 62.9 kg (138 lb 10.7 oz) (07/01 0500) HEMODYNAMICS: CVP:  [13 mmHg-21 mmHg] 18 mmHg VENTILATOR SETTINGS: Vent Mode:  [-]  FiO2 (%):  [0 %] 0 % INTAKE / OUTPUT:  Intake/Output Summary (Last 24 hours) at 09/23/14 1606 Last data filed at 09/23/14 1500  Gross per 24 hour  Intake 870.97 ml  Output      1 ml  Net 869.97 ml    PHYSICAL EXAMINATION: General:  Elderly female in NAD Neuro:  Alert, oriented, non-focal, delirium  HEENT:  [place]/AT, no JVD noted, PERRL Cardiovascular:  RRR, no MRG Lungs:  Clear bilateral breath sounds Abdomen:  Soft, non-tender, non-distended Musculoskeletal:  No acute deformity Skin:  BLE rash. L > R. Dark erythema.   LABS:  CBC  Recent Labs Lab 09/11/2014 1545 09/23/14 0140 09/23/14 0500  WBC 3.2* 3.6* 3.6*  HGB 10.5* 10.7* 10.2*  HCT 33.0* 33.9* 32.6*  PLT 127* 111* 100*   Coag's  Recent Labs Lab 09/23/14 0140  APTT 34  INR 1.74*   BMET  Recent Labs Lab 09/05/2014 1545 09/23/14 0140  09/23/14 0500  NA 137 138 140  K 5.5* 6.3* 5.3*  CL 98* 99* 100*  CO2 22 22 25   BUN 49* 52* 56*  CREATININE 7.81* 8.22* 8.55*  GLUCOSE 157* 128* 198*   Electrolytes  Recent Labs Lab 09/06/2014 1545 09/23/14 0140 09/23/14 0500  CALCIUM 7.9* 8.0* 8.2*  MG  --  1.8 1.6*  PHOS  --  7.4* 6.9*   Sepsis Markers  Recent Labs Lab 09/11/2014 1556 09/23/14 0140  LATICACIDVEN 3.69* 3.9*  PROCALCITON  --  31.16   ABG  Recent Labs Lab 09/11/2014 1621  PHART 7.427  PCO2ART 35.3  PO2ART 72.0*   Liver Enzymes  Recent Labs Lab 08/30/2014 1545 09/23/14 0140  AST 24 31  ALT 15 17  ALKPHOS 94 92  BILITOT 1.5* 2.6*  ALBUMIN 3.0* 2.8*   Cardiac Enzymes  Recent Labs Lab 09/23/14 0140 09/23/14 0500 09/23/14 1030  TROPONINI 0.11*  0.12* 0.17*   Glucose  Recent Labs Lab 09/23/14 0211 09/23/14 0506 09/23/14 0732 09/23/14 1201 09/23/14 1207 09/23/14 1241  GLUCAP 113* 193* 76 43* 58* 96    Imaging Dg Chest 1 View  09/17/2014   CLINICAL DATA:  Shortness of breath. Bilateral lower extremity swelling.  EXAM: CHEST  1 VIEW  COMPARISON:  PA and lateral chest 01/12/2014.  FINDINGS: There is cardiomegaly and vascular congestion. Small to moderate bilateral pleural effusions and basilar airspace disease, worse on the right, do not appear changed. No pneumothorax is identified.  IMPRESSION: No acute finding.  Chronic small to moderate pleural effusions and basilar airspace disease, worse on the right.  Cardiomegaly and pulmonary vascular congestion.   Electronically Signed   By: Inge Rise M.D.   On: 09/02/2014 16:21   US Abdomen Complete  09/23/2014   CLINICAL DATA:  Abdominal pain for 2 days.  History of cirrhosis.  EXAM: ULTRASOUND ABDOMEN COMPLETE  COMPARISON:  Ultrasound 08/31/2014  FINDINGS: Gallbladder: Gallbladder is contracted. There several echogenic gallstones the lumen of the gallbladder and sludge. The gallbladder wall is thickened to 4 mm. No pericholecystic fluid. Unable to assess Percell Miller sign due to limited patient responsiveness.  Common bile duct: Diameter: Normal at 4 mm.  Liver: Liver has a lobular contour.  No biliary duct dilatation.  IVC: No abnormality visualized.  Pancreas: Visualized portion unremarkable.  Spleen: Size and appearance within normal limits.  Right Kidney: Length: 10.1 cm. Echogenicity within normal limits. No mass or hydronephrosis visualized.  Left Kidney: Length: Severely atrophic and nonvisualized. CT 10/24/2013  Abdominal aorta: No aneurysm visualized.  Other findings: No ascites.  IMPRESSION: 1. Again demonstrated lobular contour of the liver consistent with cirrhosis. No ascites or biliary duct dilatation. 2. Gallbladder is contracted and thick-walled with multiple gallstones. This is  similar to comparison exam. No clear evidence acute cholecystitis.   Electronically Signed   By: Suzy Bouchard M.D.   On: 09/23/2014 08:23   Dg Chest Port 1 View  09/23/2014   CLINICAL DATA:  Attempted hemodialysis catheter placement. According to study node, this catheter will be removed and another attempted after pneumothorax excluded.  EXAM: PORTABLE CHEST - 1 VIEW  COMPARISON:  Earlier the same date.  FINDINGS: 1231 hr. Pre-existing right IJ central venous catheter appears unchanged at the mid SVC level. There is a new coiled catheter in the right supraclavicular area, presumably the attempted hemodialysis catheter. This is not clearly intravascular. There is no evidence of pneumothorax. Pulmonary edema, bilateral pleural effusions and bibasilar atelectasis are unchanged. There is stable  cardiomegaly status post median sternotomy and valve replacement.  IMPRESSION: No evidence of pneumothorax following attempted hemodialysis catheter placement. The new catheter appears coiled in the right supraclavicular area, not clearly intravascular. Catheter repositioning recommended.  These results will be called to the ordering clinician or representative by the Radiologist Assistant, and communication documented in the PACS or zVision Dashboard.   Electronically Signed   By: Richardean Sale M.D.   On: 09/23/2014 12:44   Dg Chest Port 1 View  09/23/2014   CLINICAL DATA:  Status post central line placement  EXAM: PORTABLE CHEST - 1 VIEW  COMPARISON:  09/03/2014  FINDINGS: Cardiac shadow is stable. Postsurgical changes are again seen. Bibasilar infiltrates right greater than left are seen and stable. A new right jugular central line is noted with the catheter tip in the distal superior vena cava. No pneumothorax is noted.  IMPRESSION: No pneumothorax following placement.  Stable bibasilar infiltrates.   Electronically Signed   By: Inez Catalina M.D.   On: 09/23/2014 02:09   Dg Chest Port 1 View  08/31/2014    CLINICAL DATA:  Sepsis  EXAM: PORTABLE CHEST - 1 VIEW  COMPARISON:  09/06/2014  FINDINGS: Cardiac shadow remains enlarged. Postsurgical changes are again seen. Bilateral pleural effusions right greater than left are noted. Bibasilar infiltrates are noted as well. The overall appearance is stable from the prior exam no acute bony abnormality is seen.  IMPRESSION: Stable changes in the lung bases bilaterally. No new focal abnormality is seen.   Electronically Signed   By: Inez Catalina M.D.   On: 09/17/2014 23:09     ASSESSMENT / PLAN:  PULMONARY A: No acute issues  P:   Supplemental O2 PRN [place]   CARDIOVASCULAR CVL 7/1 >>> A:  Shock, suspect septic.  Bradycardia H/o HTN, CAD  P:  MAP goal > 74mm/hg Levophed for MAP goal CVP monitoring Holding outpatient antihypertensives Ensure lactic clearing Trend troponin  RENAL A:   ESRD on HD (TTS) Hyperkalemia  P:   Follow Bmet Correct lytes as indicated Consulted nephrology 7/1 Will place HD catheter to facilitate CVVH  GASTROINTESTINAL A:   ABD pain, suspect in setting of hypotension  P:   US abdomen NPO IV protonix  HEMATOLOGIC A:   Anemia of chronic illness Thrombocytopenia   P:  Follow CBC Transfuse per ICU guidelines  INFECTIOUS A:   Septic shock, suspect cellulitic source LLE Leukopenia  P:   BCx2 7/1 > Abx: Vancomycin, start date 6/30 Abx: pip/tazo, start date 6/30  Follow B LE exam but have deferred MRI or other imaging- - not sure she can tolerate and further not clear that she could tolerate surgical debridement. I believe aggressive medical care is best course here.   ENDOCRINE A:   DM  P:   CBG monitoring and SSI  NEUROLOGIC A:   Acute metabolic encephalopathy  P:   RASS goal: 0 Monitor   FAMILY  - Updates:   - Inter-disciplinary family meet or Palliative Care meeting due by:  09/30/2014    35 minutes of independent CCM time was provided by me   Baltazar Apo, MD,  PhD 09/23/2014, 4:09 PM [facility] [phone] or if no answer [phone]

## 2014-09-23 NOTE — Progress Notes (Signed)
Inpatient Diabetes Program Recommendations  AACE/ADA: New Consensus Statement on Inpatient Glycemic Control (2013)  Target Ranges:  Prepandial:   less than 140 mg/dL      Peak postprandial:   less than 180 mg/dL (1-2 hours)      Critically ill patients:  140 - 180 mg/dL   Inpatient Diabetes Program Recommendations Correction (SSI): Decrease correction scale to sensitive Thank you  Raoul Pitch BSN, RN,CDE Inpatient Diabetes Coordinator [phone] (team pager)

## 2014-09-23 NOTE — Progress Notes (Signed)
CRITICAL VALUE ALERT  Critical value received:  All four blood culture bottles, aerobic and anaerobic have gram negitive rods   Date of notification:  09/23/14  Time of notification:  0410  Critical value read back:Yes.    Nurse who received alert:  km  Responding MD:  elink  Time MD responded:  [phone]

## 2014-09-23 NOTE — Progress Notes (Signed)
[place] Progress Note Patient Name: [name] DOB: [birth year] MRN: [id number]   Date of Service  09/23/2014  HPI/Events of Note  On high dose levo gtt  eICU Interventions  Add  Vaso gtt     Intervention Category Major Interventions: Shock - evaluation and management  [name] V. 09/23/2014, 11:59 PM

## 2014-09-23 NOTE — Consult Note (Signed)
69 year old female with a hx of ESRD on HD TTS in [place].  Her primary nephrologist is Dr. Hinda Lenis.  Her other, DM, HTN, CVA, CAD. Wednesday she began to feel bad, according to her husband, and had fever and they noticed the onset of LLE erythematous rash which was apinful and progressed to the right leg.  She felt worse and therefore missed dialysis and went to the ED at [facility]. In ED she was found to be hypotensive and bradycardic, but was felt to be volume overloaded on exam.  She was given a 500cc bolus. Hemodynamics did not improve and she was transferred to [facility] for ICU admission and further eval. On admission she reported lower abdominal pain, and L leg pain. Renal is asked to support with dialytic therapy in the form of CRRT.  BC are now growing gram neg rods on 6/30 and 7/1.  She has a RUE AVF which functions well.  Past Medical History  Diagnosis Date  . Diabetes mellitus   . Hypertension   . Hyperlipidemia   . Coronary artery disease   . Carpal tunnel syndrome   . Anemia   . Colon polyps 01/2012    Per colonoscopy, Dr. Gala Romney  . Transfusion history 03-04-13    Transfusion x1 unit a month ago-[facility]  . Stroke 2010    2010-"brief weakness,tongue thickness,incoherent"-no residual affects.  . Mitral regurgitation   . Myocardial infarction 2009  . Peritonitis 06/23/2013  . Ovarian cancer 1995    [name]  . ESRD (end stage renal disease)     Started dialysis in 2009 with hemodialysis and did HD for about 5 years using a R arm AVF.  Changed to PD due to pt likes to travel and is independent.  Her nephrologist is Dr Hinda Lenis in [place], [place].      Past Surgical History  Procedure Laterality Date  . Complete abdominal hysterectomy  1995  . Abdominal hysterectomy    . Colonoscopy with esophagogastroduodenoscopy (egd)  01/27/2012    Dr. Gala Romney. Small hiatal hernia, abnormal second portion of the duodenum with questionable extrinsic compression. Single left  sided colonic diverticulum, 2 colon polyps. Hamartomatous colon polyp  . Eus  04/09/2012    Dr. Owens Loffler: Nonspecific edema/thickening of the periampullary duodenum. Unremarkable biopsy  . Tonsillectomy      child  . Peritoneal catheter insertion  10/30/2012    for peritoneal dialysis  . Back surgery  2009    x3 level fusion-Dr. Louanne Skye  . Cardiac catheterization      x2 stents-'09- Dr. Joellen Jersey  . Esophagogastroduodenoscopy (egd) with propofol N/A 03/11/2013    Dr. Owens Loffler, small duodenal diverticulum. Edematous duodenal mucosa with friability on biopsy (pathology negative)  . Three-vessel cabg, mitral valve annuloplasty, maze  06/17/2013    [facility]  . Coronary artery bypass graft    . Esophagogastroduodenoscopy N/A 07/02/2013    Dr. Enzo Montgomery BLEED DUE TO LARGE DUODENAL ULCER/Small hiatal hernia/PYLORIC CHANNEL ULCER/Multiple CLEANED BASED ulcers in the duodenal bulb and 2nd part of the duodenum. NEGATIVE H.pylori. Elevated gastrin level in setting of Protonix  . Givens capsule study N/A 07/02/2013    not performed.   . Radiology with anesthesia N/A 08/19/2013    Procedure: RADIOLOGY WITH ANESTHESIA;  Surgeon: Rob Hickman, MD;  Location: [place];  Service: Radiology;  Laterality: N/A;  . Esophagogastroduodenoscopy N/A 03/02/2014    RMR: Previously noted peptic ulcer disease completely heald. Duodenal erosions present. 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 Recent ultrasound demonstrated cirrhotic appearing liver with an F3-4 Metavir/small stones sludge.   Social History:  reports that she quit smoking about 56 years ago. She has never used smokeless tobacco. She reports that she does not drink alcohol or use illicit drugs. Allergies:  Allergies  Allergen Reactions  . Lisinopril Swelling  . Ezetimibe Swelling    ZETIA: Tongue swelling   Family History  Problem Relation Age of Onset  . Diabetes Mother   . Heart attack Neg Hx   .  Stroke Mother     Medications:  Prior to Admission:  Prescriptions prior to admission  Medication Sig Dispense Refill Last Dose  . ALPRAZolam (XANAX) 0.5 MG tablet Take 0.5 mg by mouth at bedtime as needed for anxiety.   09/21/2014 at Unknown time  . aspirin EC 81 MG tablet Take 1 tablet (81 mg total) by mouth daily. 90 tablet 3 09/09/2014 at Unknown time  . carvedilol (COREG) 3.125 MG tablet Take 1 tablet (3.125 mg total) by mouth 2 (two) times daily with a meal. 60 tablet 6 09/21/2014 at 1000a  . EPINEPHrine 0.3 mg/0.3 mL IJ SOAJ injection Inject 0.3 mg into the muscle daily as needed (allergic reaction).   unknown  . insulin NPH-regular Human (HUMULIN 70/30) (70-30) 100 UNIT/ML injection Inject 10-20 Units into the skin 2 (two) times daily with a meal. Takes 10 units at bedtime and 20 units in the morning if sugar is above 140   09/21/2014 at Unknown time  . meclizine (ANTIVERT) 25 MG tablet Take 12.5 mg by mouth 2 (two) times daily as needed for dizziness (Vertigo).    over 30 days  . multivitamin (RENA-VIT) TABS tablet Take 1 tablet by mouth every morning.   09/21/2014 at Unknown time  . pantoprazole (PROTONIX) 40 MG tablet Take 1 tablet (40 mg total) by mouth 2 (two) times daily. 180 tablet 1 09/18/2014 at Unknown time  . sevelamer carbonate (RENVELA) 800 MG tablet Take 800-2,400 mg by mouth 3 (three) times daily with meals. 2-3 with meals, 1-2 with snacks.   09/21/2014 at Unknown time  . simvastatin (ZOCOR) 10 MG tablet Take 10 mg by mouth daily.   09/21/2014 at Unknown time  . Linaclotide (LINZESS) 145 MCG CAPS capsule Take 1 capsule (145 mcg total) by mouth daily. On empty stomach. (Patient not taking: Reported on 09/08/2014) 30 capsule 5 Taking   Scheduled: . heparin  5,000 Units Subcutaneous 3 times per day  . insulin aspart  2-6 Units Subcutaneous 6 times per day  . piperacillin-tazobactam (ZOSYN)  IV  2.25 g Intravenous 4 times per day  . sodium chloride  3 mL Intravenous Q12H  . [START  ON 09/24/2014] vancomycin  500 mg Intravenous Q24H   ROS: unable to obtain due to acuity & lethargy  Blood pressure 104/41, pulse 108, temperature 97.5 F (36.4 C), temperature source Oral, resp. rate 27, height '5\' 4"'  (1.626 m), weight 62.9 kg (138 lb 10.7 oz), SpO2 98 %.  General appearance: acutely ill appearing female Head: ATNC Eyes: Anicteric Nose: WNL Resp: clear but shallow Chest wall: Nontender Cardio: RRR GI: Sl protuberant No HSmegaly Extremities: hemorrhagic bullae LLE circumferentially > right Skin: hemorrhagic bullae as above Neurologic:lethargic  Results for orders placed or performed during the hospital encounter of 08/25/2014 (from the past 48 hour(s))  CBC with Differential/Platelet     Status: Abnormal   Collection Time: 09/06/2014  3:45 PM  Result Value Ref Range   WBC 3.2 (L)  4.0 - 10.5 K/uL   RBC 3.04 (L) 3.87 - 5.11 MIL/uL   Hemoglobin 10.5 (L) 12.0 - 15.0 g/dL   HCT 33.0 (L) 36.0 - 46.0 %   MCV 108.6 (H) 78.0 - 100.0 fL   MCH 34.5 (H) 26.0 - 34.0 pg   MCHC 31.8 30.0 - 36.0 g/dL   RDW 14.7 11.5 - 15.5 %   Platelets 127 (L) 150 - 400 K/uL    Comment: SPECIMEN CHECKED FOR CLOTS LARGE PLATELETS PRESENT PLATELET COUNT CONFIRMED BY SMEAR    Neutrophils Relative % 88 (H) 43 - 77 %   Neutro Abs 2.8 1.7 - 7.7 K/uL   Lymphocytes Relative 8 (L) 12 - 46 %   Lymphs Abs 0.3 (L) 0.7 - 4.0 K/uL   Monocytes Relative 4 3 - 12 %   Monocytes Absolute 0.1 0.1 - 1.0 K/uL   Eosinophils Relative 0 0 - 5 %   Eosinophils Absolute 0.0 0.0 - 0.7 K/uL   Basophils Relative 0 0 - 1 %   Basophils Absolute 0.0 0.0 - 0.1 K/uL  Comprehensive metabolic panel     Status: Abnormal   Collection Time: 08/25/2014  3:45 PM  Result Value Ref Range   Sodium 137 135 - 145 mmol/L   Potassium 5.5 (H) 3.5 - 5.1 mmol/L   Chloride 98 (L) 101 - 111 mmol/L   CO2 22 22 - 32 mmol/L   Glucose, Bld 157 (H) 65 - 99 mg/dL   BUN 49 (H) 6 - 20 mg/dL   Creatinine, Ser 7.81 (H) 0.44 - 1.00 mg/dL   Calcium 7.9  (L) 8.9 - 10.3 mg/dL   Total Protein 7.3 6.5 - 8.1 g/dL   Albumin 3.0 (L) 3.5 - 5.0 g/dL   AST 24 15 - 41 U/L   ALT 15 14 - 54 U/L   Alkaline Phosphatase 94 38 - 126 U/L   Total Bilirubin 1.5 (H) 0.3 - 1.2 mg/dL   GFR calc non Af Amer 5 (L) >60 mL/min   GFR calc Af Amer 5 (L) >60 mL/min    Comment: (NOTE) The eGFR has been calculated using the CKD EPI equation. This calculation has not been validated in all clinical situations. eGFR's persistently <60 mL/min signify possible Chronic Kidney Disease.    Anion gap 17 (H) 5 - 15  Troponin I     Status: Abnormal   Collection Time: 09/04/2014  3:45 PM  Result Value Ref Range   Troponin I 0.10 (H) <0.031 ng/mL    Comment:        PERSISTENTLY INCREASED TROPONIN VALUES IN THE RANGE OF 0.04-0.49 ng/mL CAN BE SEEN IN:       -UNSTABLE ANGINA       -CONGESTIVE HEART FAILURE       -MYOCARDITIS       -CHEST TRAUMA       -ARRYHTHMIAS       -LATE PRESENTING MYOCARDIAL INFARCTION       -COPD   CLINICAL FOLLOW-UP RECOMMENDED.   Culture, blood (routine x 2)     Status: None (Preliminary result)   Collection Time: 08/28/2014  3:45 PM  Result Value Ref Range   Specimen Description BLOOD LEFT HAND DRAWN BY RN BB    Special Requests BOTTLES DRAWN AEROBIC AND ANAEROBIC 5CC EACH    Culture  Setup Time      GRAM NEGATIVE RODS IN BOTH AEROBIC AND ANAEROBIC BOTTLES CRITICAL RESULT CALLED TO, READ BACK BY AND VERIFIED WITH: MOORE K AT  [place] AT 0408 ON [id number] BY FORSYTH K Performed at [facility] Performed at [facility]    Culture PENDING    Report Status PENDING   I-Stat CG4 Lactic Acid, ED     Status: Abnormal   Collection Time: 09/05/2014  3:56 PM  Result Value Ref Range   Lactic Acid, Venous 3.69 (HH) 0.5 - 2.0 mmol/L   Comment NOTIFIED PHYSICIAN   Culture, blood (routine x 2)     Status: None (Preliminary result)   Collection Time: 08/27/2014  4:00 PM  Result Value Ref Range   Specimen Description BLOOD LEFT ANTECUBITAL    Special  Requests BOTTLES DRAWN AEROBIC AND ANAEROBIC 5CC EACH    Culture  Setup Time      GRAM NEGATIVE RODS IN BOTH AEROBIC AND ANAEROBIC BOTTLES CRITICAL RESULT CALLED TO, READ BACK BY AND VERIFIED WITH: MOORE K AT [facility] AT [place] ON [id number] BY FORSYTH K Performed at [facility]    Culture PENDING    Report Status PENDING   Blood gas, arterial ([facility] & [facility] ONLY)     Status: Abnormal   Collection Time: 09/14/2014  4:21 PM  Result Value Ref Range   O2 Content 2.0 L/min   pH, Arterial 7.427 7.350 - 7.450   pCO2 arterial 35.3 35.0 - 45.0 mmHg   pO2, Arterial 72.0 (L) 80.0 - 100.0 mmHg   Bicarbonate 22.8 20.0 - 24.0 mEq/L   TCO2 20.8 0 - 100 mmol/L   Acid-base deficit 0.6 0.0 - 2.0 mmol/L   O2 Saturation 93.7 %   Collection site LEFT BRACHIAL    Drawn by [id number]    Sample type ARTERIAL DRAW    Allens test (pass/fail) PASS PASS  MRSA PCR Screening     Status: None   Collection Time: 08/27/2014  8:38 PM  Result Value Ref Range   MRSA by PCR NEGATIVE NEGATIVE    Comment:        The GeneXpert MRSA Assay (FDA approved for NASAL specimens only), is one component of a comprehensive MRSA colonization surveillance program. It is not intended to diagnose MRSA infection nor to guide or monitor treatment for MRSA infections.   Glucose, capillary     Status: Abnormal   Collection Time: 09/12/2014  8:41 PM  Result Value Ref Range   Glucose-Capillary 144 (H) 65 - 99 mg/dL  Culture, blood (routine x 2)     Status: None (Preliminary result)   Collection Time: 09/23/14 12:47 AM  Result Value Ref Range   Specimen Description BLOOD LEFT HAND    Special Requests IN PEDIATRIC BOTTLE 2CC    Culture  Setup Time      GRAM NEGATIVE RODS AEROBIC BOTTLE ONLY CRITICAL RESULT CALLED TO, READ BACK BY AND VERIFIED WITH: I BAYNES 09/23/14 @ 105 M VESTAL    Culture GRAM NEGATIVE RODS    Report Status PENDING   Comprehensive metabolic panel     Status: Abnormal   Collection Time: 09/23/14  1:40 AM  Result Value Ref  Range   Sodium 138 135 - 145 mmol/L   Potassium 6.3 (HH) 3.5 - 5.1 mmol/L    Comment: REPEATED TO VERIFY NO VISIBLE HEMOLYSIS CRITICAL RESULT CALLED TO, READ BACK BY AND VERIFIED WITH: [place] 1700 09/23/14 E.GADDY    Chloride 99 (L) 101 - 111 mmol/L   CO2 22 22 - 32 mmol/L   Glucose, Bld 128 (H) 65 - 99 mg/dL   BUN 52 (H) 6 - 20 mg/dL  Creatinine, Ser 8.22 (H) 0.44 - 1.00 mg/dL   Calcium 8.0 (L) 8.9 - 10.3 mg/dL   Total Protein 7.4 6.5 - 8.1 g/dL   Albumin 2.8 (L) 3.5 - 5.0 g/dL   AST 31 15 - 41 U/L   ALT 17 14 - 54 U/L   Alkaline Phosphatase 92 38 - 126 U/L   Total Bilirubin 2.6 (H) 0.3 - 1.2 mg/dL   GFR calc non Af Amer 4 (L) >60 mL/min   GFR calc Af Amer 5 (L) >60 mL/min    Comment: (NOTE) The eGFR has been calculated using the CKD EPI equation. This calculation has not been validated in all clinical situations. eGFR's persistently <60 mL/min signify possible Chronic Kidney Disease.    Anion gap 17 (H) 5 - 15  Magnesium     Status: None   Collection Time: 09/23/14  1:40 AM  Result Value Ref Range   Magnesium 1.8 1.7 - 2.4 mg/dL  Phosphorus     Status: Abnormal   Collection Time: 09/23/14  1:40 AM  Result Value Ref Range   Phosphorus 7.4 (H) 2.5 - 4.6 mg/dL  Amylase     Status: None   Collection Time: 09/23/14  1:40 AM  Result Value Ref Range   Amylase 41 28 - 100 U/L  Lipase, blood     Status: None   Collection Time: 09/23/14  1:40 AM  Result Value Ref Range   Lipase 38 22 - 51 U/L  Troponin I     Status: Abnormal   Collection Time: 09/23/14  1:40 AM  Result Value Ref Range   Troponin I 0.11 (H) <0.031 ng/mL    Comment:        PERSISTENTLY INCREASED TROPONIN VALUES IN THE RANGE OF 0.04-0.49 ng/mL CAN BE SEEN IN:       -UNSTABLE ANGINA       -CONGESTIVE HEART FAILURE       -MYOCARDITIS       -CHEST TRAUMA       -ARRYHTHMIAS       -LATE PRESENTING MYOCARDIAL INFARCTION       -COPD   CLINICAL FOLLOW-UP RECOMMENDED.   Lactic acid, plasma     Status:  Abnormal   Collection Time: 09/23/14  1:40 AM  Result Value Ref Range   Lactic Acid, Venous 3.9 (HH) 0.5 - 2.0 mmol/L    Comment: REPEATED TO VERIFY CRITICAL RESULT CALLED TO, READ BACK BY AND VERIFIED WITH: J.CLIFTON RN 1761 09/23/14 E.GADDY   Procalcitonin     Status: None   Collection Time: 09/23/14  1:40 AM  Result Value Ref Range   Procalcitonin 31.16 ng/mL    Comment:        Interpretation: PCT >= 10 ng/mL: Important systemic inflammatory response, almost exclusively due to severe bacterial sepsis or septic shock. (NOTE)         ICU PCT Algorithm               Non ICU PCT Algorithm    ----------------------------     ------------------------------         PCT < 0.25 ng/mL                 PCT < 0.1 ng/mL     Stopping of antibiotics            Stopping of antibiotics       strongly encouraged.               strongly encouraged.    ----------------------------     ------------------------------  PCT level decrease by               PCT < 0.25 ng/mL       >= 80% from peak PCT       OR PCT 0.25 - 0.5 ng/mL          Stopping of antibiotics                                             encouraged.     Stopping of antibiotics           encouraged.    ----------------------------     ------------------------------       PCT level decrease by              PCT >= 0.25 ng/mL       < 80% from peak PCT        AND PCT >= 0.5 ng/mL             Continuing antibiotics                                              encouraged.       Continuing antibiotics            encouraged.    ----------------------------     ------------------------------     PCT level increase compared          PCT > 0.5 ng/mL         with peak PCT AND          PCT >= 0.5 ng/mL             Escalation of antibiotics                                          strongly encouraged.      Escalation of antibiotics        strongly encouraged.   Brain natriuretic peptide     Status: Abnormal   Collection Time: 09/23/14   1:40 AM  Result Value Ref Range   B Natriuretic Peptide 1031.4 (H) 0.0 - 100.0 pg/mL  Cortisol     Status: None   Collection Time: 09/23/14  1:40 AM  Result Value Ref Range   Cortisol, Plasma >60.0 ug/dL    Comment: (NOTE) AM    6.7 - 22.6 ug/dL PM   <10.0       ug/dL   CBC WITH DIFFERENTIAL     Status: Abnormal   Collection Time: 09/23/14  1:40 AM  Result Value Ref Range   WBC 3.6 (L) 4.0 - 10.5 K/uL   RBC 3.16 (L) 3.87 - 5.11 MIL/uL   Hemoglobin 10.7 (L) 12.0 - 15.0 g/dL   HCT 33.9 (L) 36.0 - 46.0 %   MCV 107.3 (H) 78.0 - 100.0 fL   MCH 33.9 26.0 - 34.0 pg   MCHC 31.6 30.0 - 36.0 g/dL   RDW 14.8 11.5 - 15.5 %   Platelets 111 (L) 150 - 400 K/uL    Comment: PLATELET COUNT CONFIRMED BY SMEAR SPECIMEN CHECKED FOR CLOTS REPEATED TO VERIFY    Neutrophils Relative %  85 (H) 43 - 77 %   Lymphocytes Relative 10 (L) 12 - 46 %   Monocytes Relative 5 3 - 12 %   Eosinophils Relative 0 0 - 5 %   Basophils Relative 0 0 - 1 %   Neutro Abs 3.0 1.7 - 7.7 K/uL   Lymphs Abs 0.4 (L) 0.7 - 4.0 K/uL   Monocytes Absolute 0.2 0.1 - 1.0 K/uL   Eosinophils Absolute 0.0 0.0 - 0.7 K/uL   Basophils Absolute 0.0 0.0 - 0.1 K/uL   RBC Morphology POLYCHROMASIA PRESENT    WBC Morphology MILD LEFT SHIFT (1-5% METAS, OCC MYELO, OCC BANDS)     Comment: VACUOLATED NEUTROPHILS   Smear Review LARGE PLATELETS PRESENT     Comment: PLATELETS APPEAR DECREASED  Protime-INR     Status: Abnormal   Collection Time: 09/23/14  1:40 AM  Result Value Ref Range   Prothrombin Time 20.3 (H) 11.6 - 15.2 seconds   INR 1.74 (H) 0.00 - 1.49  APTT     Status: None   Collection Time: 09/23/14  1:40 AM  Result Value Ref Range   aPTT 34 24 - 37 seconds  Culture, blood (routine x 2)     Status: None (Preliminary result)   Collection Time: 09/23/14  1:40 AM  Result Value Ref Range   Specimen Description BLOOD CENTRAL LINE    Special Requests BOTTLES DRAWN AEROBIC AND ANAEROBIC 10CC EACH    Culture  Setup Time      GRAM  NEGATIVE RODS IN BOTH AEROBIC AND ANAEROBIC BOTTLES CRITICAL RESULT CALLED TO, READ BACK BY AND VERIFIED WITH: A. BAYNES, RN AT 1013 ON [id number] BY Rhea Bleacher    Culture PENDING    Report Status PENDING   Glucose, capillary     Status: Abnormal   Collection Time: 09/23/14  2:11 AM  Result Value Ref Range   Glucose-Capillary 113 (H) 65 - 99 mg/dL  Troponin I     Status: Abnormal   Collection Time: 09/23/14  5:00 AM  Result Value Ref Range   Troponin I 0.12 (H) <0.031 ng/mL    Comment:        PERSISTENTLY INCREASED TROPONIN VALUES IN THE RANGE OF 0.04-0.49 ng/mL CAN BE SEEN IN:       -UNSTABLE ANGINA       -CONGESTIVE HEART FAILURE       -MYOCARDITIS       -CHEST TRAUMA       -ARRYHTHMIAS       -LATE PRESENTING MYOCARDIAL INFARCTION       -COPD   CLINICAL FOLLOW-UP RECOMMENDED.   CBC     Status: Abnormal   Collection Time: 09/23/14  5:00 AM  Result Value Ref Range   WBC 3.6 (L) 4.0 - 10.5 K/uL   RBC 3.02 (L) 3.87 - 5.11 MIL/uL   Hemoglobin 10.2 (L) 12.0 - 15.0 g/dL   HCT 32.6 (L) 36.0 - 46.0 %   MCV 107.9 (H) 78.0 - 100.0 fL   MCH 33.8 26.0 - 34.0 pg   MCHC 31.3 30.0 - 36.0 g/dL   RDW 14.9 11.5 - 15.5 %   Platelets 100 (L) 150 - 400 K/uL    Comment: CONSISTENT WITH PREVIOUS RESULT  Basic metabolic panel     Status: Abnormal   Collection Time: 09/23/14  5:00 AM  Result Value Ref Range   Sodium 140 135 - 145 mmol/L   Potassium 5.3 (H) 3.5 - 5.1 mmol/L   Chloride 100 (L) 101 -  111 mmol/L   CO2 25 22 - 32 mmol/L   Glucose, Bld 198 (H) 65 - 99 mg/dL   BUN 56 (H) 6 - 20 mg/dL   Creatinine, Ser 8.55 (H) 0.44 - 1.00 mg/dL   Calcium 8.2 (L) 8.9 - 10.3 mg/dL   GFR calc non Af Amer 4 (L) >60 mL/min   GFR calc Af Amer 5 (L) >60 mL/min    Comment: (NOTE) The eGFR has been calculated using the CKD EPI equation. This calculation has not been validated in all clinical situations. eGFR's persistently <60 mL/min signify possible Chronic Kidney Disease.    Anion gap 15 5 - 15   Magnesium     Status: Abnormal   Collection Time: 09/23/14  5:00 AM  Result Value Ref Range   Magnesium 1.6 (L) 1.7 - 2.4 mg/dL  Phosphorus     Status: Abnormal   Collection Time: 09/23/14  5:00 AM  Result Value Ref Range   Phosphorus 6.9 (H) 2.5 - 4.6 mg/dL  Glucose, capillary     Status: Abnormal   Collection Time: 09/23/14  5:06 AM  Result Value Ref Range   Glucose-Capillary 193 (H) 65 - 99 mg/dL  Glucose, capillary     Status: None   Collection Time: 09/23/14  7:32 AM  Result Value Ref Range   Glucose-Capillary 76 65 - 99 mg/dL  Troponin I     Status: Abnormal   Collection Time: 09/23/14 10:30 AM  Result Value Ref Range   Troponin I 0.17 (H) <0.031 ng/mL    Comment:        PERSISTENTLY INCREASED TROPONIN VALUES IN THE RANGE OF 0.04-0.49 ng/mL CAN BE SEEN IN:       -UNSTABLE ANGINA       -CONGESTIVE HEART FAILURE       -MYOCARDITIS       -CHEST TRAUMA       -ARRYHTHMIAS       -LATE PRESENTING MYOCARDIAL INFARCTION       -COPD   CLINICAL FOLLOW-UP RECOMMENDED.   Glucose, capillary     Status: Abnormal   Collection Time: 09/23/14 12:01 PM  Result Value Ref Range   Glucose-Capillary 43 (LL) 65 - 99 mg/dL  Glucose, capillary     Status: Abnormal   Collection Time: 09/23/14 12:07 PM  Result Value Ref Range   Glucose-Capillary 58 (L) 65 - 99 mg/dL   Comment 1 Notify RN   Glucose, capillary     Status: None   Collection Time: 09/23/14 12:41 PM  Result Value Ref Range   Glucose-Capillary 96 65 - 99 mg/dL   Comment 1 Notify RN   Glucose, capillary     Status: None   Collection Time: 09/23/14  4:05 PM  Result Value Ref Range   Glucose-Capillary 85 65 - 99 mg/dL   Dg Chest 1 View  09/02/2014   CLINICAL DATA:  Shortness of breath. Bilateral lower extremity swelling.  EXAM: CHEST  1 VIEW  COMPARISON:  PA and lateral chest 01/12/2014.  FINDINGS: There is cardiomegaly and vascular congestion. Small to moderate bilateral pleural effusions and basilar airspace disease, worse  on the right, do not appear changed. No pneumothorax is identified.  IMPRESSION: No acute finding.  Chronic small to moderate pleural effusions and basilar airspace disease, worse on the right.  Cardiomegaly and pulmonary vascular congestion.   Electronically Signed   By: Inge Rise M.D.   On: 09/15/2014 16:21   US Abdomen Complete  09/23/2014   CLINICAL  DATA:  Abdominal pain for 2 days.  History of cirrhosis.  EXAM: ULTRASOUND ABDOMEN COMPLETE  COMPARISON:  Ultrasound 08/31/2014  FINDINGS: Gallbladder: Gallbladder is contracted. There several echogenic gallstones the lumen of the gallbladder and sludge. The gallbladder wall is thickened to 4 mm. No pericholecystic fluid. Unable to assess Percell Miller sign due to limited patient responsiveness.  Common bile duct: Diameter: Normal at 4 mm.  Liver: Liver has a lobular contour.  No biliary duct dilatation.  IVC: No abnormality visualized.  Pancreas: Visualized portion unremarkable.  Spleen: Size and appearance within normal limits.  Right Kidney: Length: 10.1 cm. Echogenicity within normal limits. No mass or hydronephrosis visualized.  Left Kidney: Length: Severely atrophic and nonvisualized. CT 10/24/2013  Abdominal aorta: No aneurysm visualized.  Other findings: No ascites.  IMPRESSION: 1. Again demonstrated lobular contour of the liver consistent with cirrhosis. No ascites or biliary duct dilatation. 2. Gallbladder is contracted and thick-walled with multiple gallstones. This is similar to comparison exam. No clear evidence acute cholecystitis.   Electronically Signed   By: Suzy Bouchard M.D.   On: 09/23/2014 08:23   Dg Chest Port 1 View  09/23/2014   CLINICAL DATA:  Attempted hemodialysis catheter placement. According to study node, this catheter will be removed and another attempted after pneumothorax excluded.  EXAM: PORTABLE CHEST - 1 VIEW  COMPARISON:  Earlier the same date.  FINDINGS: 1231 hr. Pre-existing right IJ central venous catheter appears  unchanged at the mid SVC level. There is a new coiled catheter in the right supraclavicular area, presumably the attempted hemodialysis catheter. This is not clearly intravascular. There is no evidence of pneumothorax. Pulmonary edema, bilateral pleural effusions and bibasilar atelectasis are unchanged. There is stable cardiomegaly status post median sternotomy and valve replacement.  IMPRESSION: No evidence of pneumothorax following attempted hemodialysis catheter placement. The new catheter appears coiled in the right supraclavicular area, not clearly intravascular. Catheter repositioning recommended.  These results will be called to the ordering clinician or representative by the Radiologist Assistant, and communication documented in the PACS or zVision Dashboard.   Electronically Signed   By: Richardean Sale M.D.   On: 09/23/2014 12:44   Dg Chest Port 1 View  09/23/2014   CLINICAL DATA:  Status post central line placement  EXAM: PORTABLE CHEST - 1 VIEW  COMPARISON:  09/09/2014  FINDINGS: Cardiac shadow is stable. Postsurgical changes are again seen. Bibasilar infiltrates right greater than left are seen and stable. A new right jugular central line is noted with the catheter tip in the distal superior vena cava. No pneumothorax is noted.  IMPRESSION: No pneumothorax following placement.  Stable bibasilar infiltrates.   Electronically Signed   By: Inez Catalina M.D.   On: 09/23/2014 02:09   Dg Chest Port 1 View  08/26/2014   CLINICAL DATA:  Sepsis  EXAM: PORTABLE CHEST - 1 VIEW  COMPARISON:  09/12/2014  FINDINGS: Cardiac shadow remains enlarged. Postsurgical changes are again seen. Bilateral pleural effusions right greater than left are noted. Bibasilar infiltrates are noted as well. The overall appearance is stable from the prior exam no acute bony abnormality is seen.  IMPRESSION: Stable changes in the lung bases bilaterally. No new focal abnormality is seen.   Electronically Signed   By: Inez Catalina M.D.    On: 09/06/2014 23:09    Assessment:  1 ESRD 2 Gram negative septic shock  Plan:  Will support with CVVHD   Dennice Tindol C 09/23/2014, 5:05 PM

## 2014-09-23 NOTE — Progress Notes (Signed)
[place] Progress Note Patient Name: DEITRA CRAINE DOB: Sep 25, 1945 MRN: [id number]   Date of Service  09/23/2014  HPI/Events of Note  Blood cultures (4/4 bottles) are positive for GNR's. Patient is already on Zosyn.  eICU Interventions  Continue present management.      Intervention Category Intermediate Interventions: Diagnostic test evaluation  Giulian Goldring [place] 09/23/2014, 4:12 AM

## 2014-09-23 NOTE — Progress Notes (Signed)
CRITICAL VALUE ALERT  Critical value received:  Temp 101.6  Date of notification: 09/23/14  Time of notification: 0500  Critical value read back: Yes  Nurse who received alert:  Rito Ehrlich, RN  MD notified (1st page):  ELINK  Time of first page:  0515  MD notified (2nd page):  Time of second page:  Responding MD:  Warren Lacy  Time MD responded:  [phone]

## 2014-09-23 DEATH — deceased

## 2014-09-24 ENCOUNTER — Inpatient Hospital Stay (HOSPITAL_COMMUNITY): Payer: Medicare Other

## 2014-09-24 DIAGNOSIS — R109 Unspecified abdominal pain: Secondary | ICD-10-CM | POA: Insufficient documentation

## 2014-09-24 DIAGNOSIS — R6521 Severe sepsis with septic shock: Secondary | ICD-10-CM

## 2014-09-24 DIAGNOSIS — I9589 Other hypotension: Secondary | ICD-10-CM

## 2014-09-24 DIAGNOSIS — J9602 Acute respiratory failure with hypercapnia: Secondary | ICD-10-CM

## 2014-09-24 DIAGNOSIS — E440 Moderate protein-calorie malnutrition: Secondary | ICD-10-CM | POA: Insufficient documentation

## 2014-09-24 DIAGNOSIS — J9600 Acute respiratory failure, unspecified whether with hypoxia or hypercapnia: Secondary | ICD-10-CM | POA: Insufficient documentation

## 2014-09-24 DIAGNOSIS — R1010 Upper abdominal pain, unspecified: Secondary | ICD-10-CM

## 2014-09-24 DIAGNOSIS — A419 Sepsis, unspecified organism: Secondary | ICD-10-CM | POA: Insufficient documentation

## 2014-09-24 LAB — GLUCOSE, CAPILLARY
GLUCOSE-CAPILLARY: 128 mg/dL — AB (ref 65–99)
GLUCOSE-CAPILLARY: 83 mg/dL (ref 65–99)
Glucose-Capillary: 69 mg/dL (ref 65–99)
Glucose-Capillary: 64 mg/dL — ABNORMAL LOW (ref 65–99)
Glucose-Capillary: 113 mg/dL — ABNORMAL HIGH (ref 65–99)
Glucose-Capillary: 113 mg/dL — ABNORMAL HIGH (ref 65–99)
Glucose-Capillary: 65 mg/dL (ref 65–99)
Glucose-Capillary: 104 mg/dL — ABNORMAL HIGH (ref 65–99)

## 2014-09-24 LAB — BLOOD GAS, ARTERIAL
Acid-base deficit: 7.5 mmol/L — ABNORMAL HIGH (ref 0.0–2.0)
Bicarbonate: 18.3 meq/L — ABNORMAL LOW (ref 20.0–24.0)
Drawn by: 42624
FIO2: 0.4 %
MECHVT: 440 mL
O2 Saturation: 99 %
PEEP: 5 cmH2O
Patient temperature: 98.6
RATE: 20 {breaths}/min
TCO2: 19.6 mmol/L (ref 0–100)
pCO2 arterial: 41.8 mmHg (ref 35.0–45.0)
pH, Arterial: 7.264 — ABNORMAL LOW (ref 7.350–7.450)
pO2, Arterial: 163 mmHg — ABNORMAL HIGH (ref 80.0–100.0)

## 2014-09-24 LAB — LACTIC ACID, PLASMA: Lactic Acid, Venous: 5.2 mmol/L (ref 0.5–2.0)

## 2014-09-24 LAB — RENAL FUNCTION PANEL
ANION GAP: 18 — AB (ref 5–15)
Albumin: 2.6 g/dL — ABNORMAL LOW (ref 3.5–5.0)
Albumin: 2.4 g/dL — ABNORMAL LOW (ref 3.5–5.0)
Anion gap: 15 (ref 5–15)
BUN: 38 mg/dL — ABNORMAL HIGH (ref 6–20)
BUN: 24 mg/dL — AB (ref 6–20)
CHLORIDE: 99 mmol/L — AB (ref 101–111)
CO2: 17 mmol/L — AB (ref 22–32)
CO2: 19 mmol/L — AB (ref 22–32)
CREATININE: 2.76 mg/dL — AB (ref 0.44–1.00)
Calcium: 7.8 mg/dL — ABNORMAL LOW (ref 8.9–10.3)
Calcium: 7.6 mg/dL — ABNORMAL LOW (ref 8.9–10.3)
Chloride: 100 mmol/L — ABNORMAL LOW (ref 101–111)
Creatinine, Ser: 4.47 mg/dL — ABNORMAL HIGH (ref 0.44–1.00)
GFR calc non Af Amer: 9 mL/min — ABNORMAL LOW (ref >60)
GFR calc non Af Amer: 16 mL/min — ABNORMAL LOW (ref >60)
GFR, EST AFRICAN AMERICAN: 11 mL/min — AB (ref >60)
GFR, EST AFRICAN AMERICAN: 19 mL/min — AB (ref >60)
GLUCOSE: 67 mg/dL (ref 65–99)
Glucose, Bld: 75 mg/dL (ref 65–99)
PHOSPHORUS: 3.2 mg/dL (ref 2.5–4.6)
Phosphorus: 5.1 mg/dL — ABNORMAL HIGH (ref 2.5–4.6)
Potassium: 4.9 mmol/L (ref 3.5–5.1)
Potassium: 4.4 mmol/L (ref 3.5–5.1)
SODIUM: 134 mmol/L — AB (ref 135–145)
SODIUM: 134 mmol/L — AB (ref 135–145)

## 2014-09-24 LAB — POCT I-STAT 3, ART BLOOD GAS (G3+)
ACID-BASE DEFICIT: 6 mmol/L — AB (ref 0.0–2.0)
Acid-base deficit: 4 mmol/L — ABNORMAL HIGH (ref 0.0–2.0)
Bicarbonate: 21.1 mEq/L (ref 20.0–24.0)
Bicarbonate: 24.2 mEq/L — ABNORMAL HIGH (ref 20.0–24.0)
O2 SAT: 97 %
O2 Saturation: 99 %
PCO2 ART: 37.1 mmHg (ref 35.0–45.0)
PH ART: 7.359 (ref 7.350–7.450)
Patient temperature: 93.1
Patient temperature: 97.2
TCO2: 22 mmol/L (ref 0–100)
TCO2: 26 mmol/L (ref 0–100)
pCO2 arterial: 64.8 mmHg (ref 35.0–45.0)
pH, Arterial: 7.162 — CL (ref 7.350–7.450)
pO2, Arterial: 123 mmHg — ABNORMAL HIGH (ref 80.0–100.0)
pO2, Arterial: 99 mmHg (ref 80.0–100.0)

## 2014-09-24 LAB — MAGNESIUM
MAGNESIUM: 2.2 mg/dL (ref 1.7–2.4)
Magnesium: 2.1 mg/dL (ref 1.7–2.4)

## 2014-09-24 LAB — PHOSPHORUS: Phosphorus: 1.9 mg/dL — ABNORMAL LOW (ref 2.5–4.6)

## 2014-09-24 MED ORDER — MIDAZOLAM HCL 2 MG/2ML IJ SOLN
INTRAMUSCULAR | Status: AC
  Filled 2014-09-24: qty 4

## 2014-09-24 MED ORDER — MIDAZOLAM HCL 2 MG/2ML IJ SOLN
0.5000 mg | Freq: Once | INTRAMUSCULAR | Status: AC
  Administered 2014-09-24: 0.5 mg via INTRAVENOUS

## 2014-09-24 MED ORDER — NEPRO/CARBSTEADY PO LIQD
1000.0000 mL | ORAL | Status: DC
  Filled 2014-09-24 (×2): qty 1000

## 2014-09-24 MED ORDER — VITAL AF 1.2 CAL PO LIQD
1000.0000 mL | ORAL | Status: DC
  Administered 2014-09-24 – 2014-09-25 (×2): 1000 mL
  Filled 2014-09-24 (×7): qty 1000

## 2014-09-24 MED ORDER — PRO-STAT SUGAR FREE PO LIQD
30.0000 mL | Freq: Every day | ORAL | Status: DC
  Administered 2014-09-25 – 2014-09-28 (×4): 30 mL
  Filled 2014-09-24 (×5): qty 30

## 2014-09-24 MED ORDER — FENTANYL CITRATE (PF) 100 MCG/2ML IJ SOLN
INTRAMUSCULAR | Status: AC
  Filled 2014-09-24: qty 4

## 2014-09-24 MED ORDER — CETYLPYRIDINIUM CHLORIDE 0.05 % MT LIQD
7.0000 mL | Freq: Four times a day (QID) | OROMUCOSAL | Status: DC
  Administered 2014-09-24 – 2014-09-29 (×21): 7 mL via OROMUCOSAL

## 2014-09-24 MED ORDER — FENTANYL CITRATE (PF) 100 MCG/2ML IJ SOLN
50.0000 ug | Freq: Once | INTRAMUSCULAR | Status: AC
  Administered 2014-09-24: 50 ug via INTRAVENOUS

## 2014-09-24 MED ORDER — CHLORHEXIDINE GLUCONATE 0.12 % MT SOLN
15.0000 mL | Freq: Two times a day (BID) | OROMUCOSAL | Status: DC
  Administered 2014-09-24 – 2014-09-29 (×11): 15 mL via OROMUCOSAL
  Filled 2014-09-24 (×11): qty 15

## 2014-09-24 MED ORDER — DEXTROSE 50 % IV SOLN
25.0000 mL | INTRAVENOUS | Status: DC | PRN
  Administered 2014-09-24 (×2): 25 mL via INTRAVENOUS
  Filled 2014-09-24: qty 50

## 2014-09-24 MED ORDER — FAMOTIDINE IN NACL 20-0.9 MG/50ML-% IV SOLN
20.0000 mg | INTRAVENOUS | Status: DC
  Administered 2014-09-25 – 2014-09-28 (×4): 20 mg via INTRAVENOUS
  Filled 2014-09-24 (×4): qty 50

## 2014-09-24 MED ORDER — ETOMIDATE 2 MG/ML IV SOLN
0.3000 mg/kg | Freq: Once | INTRAVENOUS | Status: AC
  Administered 2014-09-24: 20 mg via INTRAVENOUS

## 2014-09-24 MED ORDER — VITAL HIGH PROTEIN PO LIQD
1000.0000 mL | ORAL | Status: DC
  Filled 2014-09-24 (×2): qty 1000

## 2014-09-24 MED ORDER — PRO-STAT SUGAR FREE PO LIQD
30.0000 mL | Freq: Two times a day (BID) | ORAL | Status: DC
  Filled 2014-09-24 (×2): qty 30

## 2014-09-24 MED ORDER — FENTANYL CITRATE (PF) 100 MCG/2ML IJ SOLN
50.0000 ug | INTRAMUSCULAR | Status: DC | PRN
  Administered 2014-09-25: 50 ug via INTRAVENOUS

## 2014-09-24 MED ORDER — FAMOTIDINE IN NACL 20-0.9 MG/50ML-% IV SOLN
20.0000 mg | Freq: Two times a day (BID) | INTRAVENOUS | Status: DC
  Administered 2014-09-24 (×2): 20 mg via INTRAVENOUS
  Filled 2014-09-24 (×3): qty 50

## 2014-09-24 MED ORDER — FENTANYL CITRATE (PF) 100 MCG/2ML IJ SOLN: 50.0000 ug | INTRAMUSCULAR | Status: DC | PRN

## 2014-09-24 MED ORDER — DEXTROSE 50 % IV SOLN
25.0000 mL | Freq: Once | INTRAVENOUS | Status: AC
  Administered 2014-09-24: 25 mL via INTRAVENOUS

## 2014-09-24 NOTE — Progress Notes (Signed)
CRITICAL VALUE ALERT  Critical value received:  Lactic 5.2   Date of notification:  09/24/14  Time of notification:  1700  Critical value read back:Yes.    Nurse who received alert:  Rosine Door  MD notified (1st page):  Titus Mould  Time of first page:  48  MD notified (2nd page):  Time of second page:  Responding MD:  Dr. Titus Mould  Time MD responded:  1700

## 2014-09-24 NOTE — Progress Notes (Signed)
PULMONARY / CRITICAL CARE MEDICINE   Name: [name] MRN: [id number] DOB: November 25, 1945    ADMISSION DATE:  08/31/2014 CONSULTATION DATE:  08/25/2014  REFERRING MD :  Leslye Peer Penn  CHIEF COMPLAINT:  fever  INITIAL PRESENTATION: 69 year old female presented to [facility] ED 6/30 complaining of fever and abdominal pain x 1 day. Also with LLE rash. Missed dialysis due to malaise and presented to ED. She was found to be hypotensive and bradycardic. Transferred to [facility] for ICU admission.   STUDIES:  abd u/s 7/1>>> cirrhosis, multiple gallstones without clear evidence cholecystitis   SIGNIFICANT EVENTS:  7/2>>worsening resp failure, intubated  SUBJECTIVE:  Decompensated overnight requiring intubation. Remains on mod dose pressors, CVVH  VITAL SIGNS: Temp:  [93.1 F (33.9 C)-98.4 F (36.9 C)] 98.4 F (36.9 C) (07/02 1223) Pulse Rate:  [30-108] 63 (07/02 1200) Resp:  [12-32] 23 (07/02 1200) BP: (67-158)/(33-93) 87/44 mmHg (07/02 1200) SpO2:  [94 %-100 %] 99 % (07/02 1200) Arterial Line BP: (107-123)/(36) 107/36 mmHg (07/02 0900) FiO2 (%):  [40 %] 40 % (07/02 1149) Weight:  [141 lb 1.5 oz (64 kg)] 141 lb 1.5 oz (64 kg) (07/02 0500) HEMODYNAMICS: CVP:  [12 mmHg-24 mmHg] 12 mmHg VENTILATOR SETTINGS: Vent Mode:  [-] PRVC FiO2 (%):  [40 %] 40 % Set Rate:  [14 bmp-20 bmp] 14 bmp Vt Set:  [440 mL] 440 mL PEEP:  [5 cmH20] 5 cmH20 Plateau Pressure:  [22 LOV56-43 cmH20] 22 cmH20 INTAKE / OUTPUT:  Intake/Output Summary (Last 24 hours) at 09/24/14 1321 Last data filed at 09/24/14 1203  Gross per 24 hour  Intake 1405.27 ml  Output   3379 ml  Net -1973.73 ml    PHYSICAL EXAMINATION: General:  Elderly female in NAD on vent  Neuro:  Somnolent on vent, opens eyes to voice, does not follow commands  HEENT:  [place]/AT, no JVD noted, PERRL Cardiovascular:  RRR, no MRG Lungs:  resps even non labored on vent, clear bilateral breath sounds Abdomen:  Soft, non-tender,  non-distended Musculoskeletal:  No acute deformity Skin:  BLE rash. L > R. Severe erythema, blistered.   LABS:  CBC  Recent Labs Lab 09/21/2014 1545 09/23/14 0140 09/23/14 0500  WBC 3.2* 3.6* 3.6*  HGB 10.5* 10.7* 10.2*  HCT 33.0* 33.9* 32.6*  PLT 127* 111* 100*   Coag's  Recent Labs Lab 09/23/14 0140  APTT 34  INR 1.74*   BMET  Recent Labs Lab 09/23/14 0500 09/23/14 1645 09/24/14 0423  NA 140 140 134*  K 5.3* 5.6* 4.9  CL 100* 100* 99*  CO2 25 24 17*  BUN 56* 65* 38*  CREATININE 8.55* 8.62* 4.47*  GLUCOSE 198* 84 75   Electrolytes  Recent Labs Lab 09/23/14 0140 09/23/14 0500 09/23/14 1645 09/24/14 0423  CALCIUM 8.0* 8.2* 7.9* 7.8*  MG 1.8 1.6*  --  2.1  PHOS 7.4* 6.9* 8.9* 5.1*   Sepsis Markers  Recent Labs Lab 08/28/2014 1556 09/23/14 0140  LATICACIDVEN 3.69* 3.9*  PROCALCITON  --  31.16   ABG  Recent Labs Lab 08/24/2014 1621 09/24/14 0029 09/24/14 0235  PHART 7.427 7.162* 7.264*  PCO2ART 35.3 64.8* 41.8  PO2ART 72.0* 99.0 163*   Liver Enzymes  Recent Labs Lab 08/28/2014 1545 09/23/14 0140 09/23/14 1645 09/24/14 0423  AST 24 31  --   --   ALT 15 17  --   --   ALKPHOS 94 92  --   --   BILITOT 1.5* 2.6*  --   --  ALBUMIN 3.0* 2.8* 2.5* 2.6*   Cardiac Enzymes  Recent Labs Lab 09/23/14 0140 09/23/14 0500 09/23/14 1030  TROPONINI 0.11* 0.12* 0.17*   Glucose  Recent Labs Lab 09/24/14 0352 09/24/14 0356 09/24/14 0427 09/24/14 0835 09/24/14 0930 09/24/14 1225  GLUCAP 33* 69 128* 64* 113* 83    Imaging Portable Chest Xray  09/24/2014   CLINICAL DATA:  Endotracheal tube placement.  Initial encounter.  EXAM: PORTABLE CHEST - 1 VIEW  COMPARISON:  Chest radiograph performed 09/23/2014  FINDINGS: The patient's endotracheal tube is seen ending 4 cm above the carina.  An enteric tube is noted extending below the diaphragm. A right IJ line is noted ending about the mid SVC.  A small right pleural effusion is seen. Bibasilar  airspace opacification likely reflects pulmonary edema, relatively stable from the prior study. No pneumothorax is seen.  The cardiomediastinal silhouette is enlarged. The patient is status post median sternotomy. A valve replacement is noted. No acute osseous abnormalities are identified.  IMPRESSION: 1. Endotracheal tube seen ending 4 cm above the carina. 2. Small right pleural effusion noted. Bibasilar airspace opacification likely reflects pulmonary edema, relatively stable from the recent prior study. Cardiomegaly noted.   Electronically Signed   By: Garald Balding M.D.   On: 09/24/2014 01:48     ASSESSMENT / PLAN:  PULMONARY A: Acute respiratory failure - r/t AMS, sepsis  bilat effusions pulm edema P:   Vent support - 8cc/kg  F/u CXR  F/u ABG   CARDIOVASCULAR CVL 7/1 >>> A:  Septic shock  Bradycardia H/o HTN, CAD P:  MAP goal > 2mm/hg Levophed for MAP goal CVP monitoring Holding outpatient antihypertensives Ensure lactic clearing   RENAL A:   ESRD on HD (TTS) Hyperkalemia P:   CVVHD per renal  Follow Bmet Correct lytes as indicated   GASTROINTESTINAL A:   ABD pain, suspect in setting of hypotension P:   NPO IV protonix  HEMATOLOGIC A:   Anemia of chronic illness Thrombocytopenia   P:  Follow CBC Transfuse per ICU guidelines  INFECTIOUS A:   Septic shock, suspect cellulitic source LLE Leukopenia  P:   BCx2 7/1 >GNR 2/2>>> Vancomycin>>> 6/30 pip/tazo >>>> 6/30  Cont broad spectrum abx, may be able to d/c vanc.   Trend lactate, pct  BLE exam seems to be worsening but not clear that she would tolerate MRI and highly doubt she would tolerate surgical intervention of any kind.    ENDOCRINE A:   DM P:   CBG monitoring and SSI   NEUROLOGIC A:   Acute metabolic encephalopathy P:   RASS goal: -1 Prn fentanyl  Daily WUA    FAMILY  - Updates: no family available 7/2.  NEED to discuss goals of care.  Will try to reach family via  phone.   - Inter-disciplinary family meet or Palliative Care meeting due by:  09/30/2014   Nickolas Madrid, NP 09/24/2014  1:21 PM Pager: [phone] or [phone]   STAFF NOTE: Linwood Dibbles, MD FACP have personally reviewed patient's available data, including medical history, events of note, physical examination and test results as part of my evaluation. I have discussed with resident/NP and other care providers such as pharmacist, RN and RRT. In addition, I personally evaluated patient and elicited key findings of: intubated, sedated, acidotic, increase MV / rate vent, repeat abg, low odse pressors required, need neg  Balance further, as likely edema, effusions major contributor to need ETT, if declines low threshold addition clinda,  assess xray legs for gas, cortisol adequate, no need stress roids The patient is critically ill with multiple organ systems failure and requires high complexity decision making for assessment and support, frequent evaluation and titration of therapies, application of advanced monitoring technologies and extensive interpretation of multiple databases.   Critical Care Time devoted to patient care services described in this note is 30 Minutes. This time reflects time of care of this signee: Merrie Roof, MD FACP. This critical care time does not reflect procedure time, or teaching time or supervisory time of PA/NP/Med student/Med Resident etc but could involve care discussion time. Rest per NP/medical resident whose note is outlined above and that I agree with   Lavon Paganini. Titus Mould, MD, [place] Pgr: [facility] 09/24/2014 1:45 PM

## 2014-09-24 NOTE — Progress Notes (Signed)
Assessment:  1 ESRD 2 Gram negative septic shock  Plan: Will continue to support with CVVHD, increase dialysate rate with acidosis  Subjective: Interval History: Intubated yesterday  Objective: Vital signs in last 24 hours: Temp:  [93.1 F (33.9 C)-97.5 F (36.4 C)] 97.3 F (36.3 C) (07/02 0837) Pulse Rate:  [30-109] 61 (07/02 1000) Resp:  [12-32] 15 (07/02 1000) BP: (67-158)/(33-93) 101/46 mmHg (07/02 1000) SpO2:  [94 %-100 %] 100 % (07/02 1000) Arterial Line BP: (107-123)/(36) 107/36 mmHg (07/02 0900) FiO2 (%):  [40 %] 40 % (07/02 0900) Weight:  [64 kg (141 lb 1.5 oz)] 64 kg (141 lb 1.5 oz) (07/02 0500) Weight change: 3.218 kg (7 lb 1.5 oz)  Intake/Output from previous day: 07/01 0701 - 07/02 0700 In: 1257.6 [I.V.:1007.6; IV Piggyback:250] Out: 2489  Intake/Output this shift: Total I/O In: 264.6 [I.V.:114.6; IV Piggyback:150] Out: 577 [Other:577]  General appearance: arouses some Extremities: hemorrhagic bullae weeping and now flat Neurologic: arouses some  Lab Results:  Recent Labs  09/23/14 0140 09/23/14 0500  WBC 3.6* 3.6*  HGB 10.7* 10.2*  HCT 33.9* 32.6*  PLT 111* 100*   BMET:  Recent Labs  09/23/14 1645 09/24/14 0423  NA 140 134*  K 5.6* 4.9  CL 100* 99*  CO2 24 17*  GLUCOSE 84 75  BUN 65* 38*  CREATININE 8.62* 4.47*  CALCIUM 7.9* 7.8*   No results for input(s): PTH in the last 72 hours. Iron Studies: No results for input(s): IRON, TIBC, TRANSFERRIN, FERRITIN in the last 72 hours. Studies/Results: Dg Chest 1 View  09/11/2014   CLINICAL DATA:  Shortness of breath. Bilateral lower extremity swelling.  EXAM: CHEST  1 VIEW  COMPARISON:  PA and lateral chest 01/12/2014.  FINDINGS: There is cardiomegaly and vascular congestion. Small to moderate bilateral pleural effusions and basilar airspace disease, worse on the right, do not appear changed. No pneumothorax is identified.  IMPRESSION: No acute finding.  Chronic small to moderate pleural  effusions and basilar airspace disease, worse on the right.  Cardiomegaly and pulmonary vascular congestion.   Electronically Signed   By: Inge Rise M.D.   On: 09/01/2014 16:21   US Abdomen Complete  09/23/2014   CLINICAL DATA:  Abdominal pain for 2 days.  History of cirrhosis.  EXAM: ULTRASOUND ABDOMEN COMPLETE  COMPARISON:  Ultrasound 08/31/2014  FINDINGS: Gallbladder: Gallbladder is contracted. There several echogenic gallstones the lumen of the gallbladder and sludge. The gallbladder wall is thickened to 4 mm. No pericholecystic fluid. Unable to assess Percell Miller sign due to limited patient responsiveness.  Common bile duct: Diameter: Normal at 4 mm.  Liver: Liver has a lobular contour.  No biliary duct dilatation.  IVC: No abnormality visualized.  Pancreas: Visualized portion unremarkable.  Spleen: Size and appearance within normal limits.  Right Kidney: Length: 10.1 cm. Echogenicity within normal limits. No mass or hydronephrosis visualized.  Left Kidney: Length: Severely atrophic and nonvisualized. CT 10/24/2013  Abdominal aorta: No aneurysm visualized.  Other findings: No ascites.  IMPRESSION: 1. Again demonstrated lobular contour of the liver consistent with cirrhosis. No ascites or biliary duct dilatation. 2. Gallbladder is contracted and thick-walled with multiple gallstones. This is similar to comparison exam. No clear evidence acute cholecystitis.   Electronically Signed   By: Suzy Bouchard M.D.   On: 09/23/2014 08:23   Portable Chest Xray  09/24/2014   CLINICAL DATA:  Endotracheal tube placement.  Initial encounter.  EXAM: PORTABLE CHEST - 1 VIEW  COMPARISON:  Chest radiograph performed 09/23/2014  FINDINGS: The patient's endotracheal tube is seen ending 4 cm above the carina.  An enteric tube is noted extending below the diaphragm. A right IJ line is noted ending about the mid SVC.  A small right pleural effusion is seen. Bibasilar airspace opacification likely reflects pulmonary edema,  relatively stable from the prior study. No pneumothorax is seen.  The cardiomediastinal silhouette is enlarged. The patient is status post median sternotomy. A valve replacement is noted. No acute osseous abnormalities are identified.  IMPRESSION: 1. Endotracheal tube seen ending 4 cm above the carina. 2. Small right pleural effusion noted. Bibasilar airspace opacification likely reflects pulmonary edema, relatively stable from the recent prior study. Cardiomegaly noted.   Electronically Signed   By: Garald Balding M.D.   On: 09/24/2014 01:48   Dg Chest Port 1 View  09/23/2014   CLINICAL DATA:  Attempted hemodialysis catheter placement. According to study node, this catheter will be removed and another attempted after pneumothorax excluded.  EXAM: PORTABLE CHEST - 1 VIEW  COMPARISON:  Earlier the same date.  FINDINGS: 1231 hr. Pre-existing right IJ central venous catheter appears unchanged at the mid SVC level. There is a new coiled catheter in the right supraclavicular area, presumably the attempted hemodialysis catheter. This is not clearly intravascular. There is no evidence of pneumothorax. Pulmonary edema, bilateral pleural effusions and bibasilar atelectasis are unchanged. There is stable cardiomegaly status post median sternotomy and valve replacement.  IMPRESSION: No evidence of pneumothorax following attempted hemodialysis catheter placement. The new catheter appears coiled in the right supraclavicular area, not clearly intravascular. Catheter repositioning recommended.  These results will be called to the ordering clinician or representative by the Radiologist Assistant, and communication documented in the PACS or zVision Dashboard.   Electronically Signed   By: Richardean Sale M.D.   On: 09/23/2014 12:44   Dg Chest Port 1 View  09/23/2014   CLINICAL DATA:  Status post central line placement  EXAM: PORTABLE CHEST - 1 VIEW  COMPARISON:  09/17/2014  FINDINGS: Cardiac shadow is stable. Postsurgical  changes are again seen. Bibasilar infiltrates right greater than left are seen and stable. A new right jugular central line is noted with the catheter tip in the distal superior vena cava. No pneumothorax is noted.  IMPRESSION: No pneumothorax following placement.  Stable bibasilar infiltrates.   Electronically Signed   By: Inez Catalina M.D.   On: 09/23/2014 02:09   Dg Chest Port 1 View  09/07/2014   CLINICAL DATA:  Sepsis  EXAM: PORTABLE CHEST - 1 VIEW  COMPARISON:  09/14/2014  FINDINGS: Cardiac shadow remains enlarged. Postsurgical changes are again seen. Bilateral pleural effusions right greater than left are noted. Bibasilar infiltrates are noted as well. The overall appearance is stable from the prior exam no acute bony abnormality is seen.  IMPRESSION: Stable changes in the lung bases bilaterally. No new focal abnormality is seen.   Electronically Signed   By: Inez Catalina M.D.   On: 08/30/2014 23:09   Scheduled: . antiseptic oral rinse  7 mL Mouth Rinse QID  . chlorhexidine  15 mL Mouth Rinse BID  . famotidine (PEPCID) IV  20 mg Intravenous Q12H  . heparin  5,000 Units Subcutaneous 3 times per day  . insulin aspart  2-6 Units Subcutaneous 6 times per day  . piperacillin-tazobactam (ZOSYN)  IV  2.25 g Intravenous 4 times per day  . sodium chloride  3 mL Intravenous Q12H  . vancomycin  500 mg Intravenous Q24H  LOS: 2 days   Francois Elk C 09/24/2014,10:29 AM

## 2014-09-24 NOTE — Progress Notes (Signed)
Patient ID: [name], female   DOB: 1945-11-14, 69 y.o.   MRN: [id number] I have had extensive discussions with family husband. We discussed patients current circumstances and organ failures. We also discussed patient's prior wishes under circumstances such as this. Family has decided to NOT perform resuscitation if arrest but to continue current medical support for now. She also would not want surgery of any kind.   Lavon Paganini. Titus Mould, MD, [place] Pgr: [facility]

## 2014-09-24 NOTE — Procedures (Signed)
Arterial Catheter Insertion Procedure Note [name] [id number] 1945-04-18  Procedure: Insertion of Arterial Catheter  Indications: Blood pressure monitoring  Procedure Details Consent: Unable to obtain consent because of altered level of consciousness. Time Out: Verified patient identification, verified procedure, site/side was marked, verified correct patient position, special equipment/implants available, medications/allergies/relevent history reviewed, required imaging and test results available.  Performed Real time [place] was used to ID and cannulate the vessel  Maximum sterile technique was used including antiseptics, cap, gloves, gown, hand hygiene, mask and sheet. Skin prep: Chlorhexidine; local anesthetic administered 20 gauge catheter was inserted into right femoral artery using the Seldinger technique.  Evaluation Blood flow good; BP tracing good. Complications: No apparent complications.   Krista Godsil,PETE 09/24/2014

## 2014-09-24 NOTE — Progress Notes (Addendum)
Initial Nutrition Assessment  DOCUMENTATION CODES:  Non-severe (moderate) malnutrition in context of chronic illness  INTERVENTION: - Initiate VITAL AF 1.2 @ 45 mL/hr with Prostat once/day. This will provide 1396 kcal, 96 grams of protein  and 876 mL free water. - RD will continue to monitor for needs  NUTRITION DIAGNOSIS: Inadequate oral intake related to inability to eat as evidenced by NPO status.  GOAL:  Patient will meet greater than or equal to 90% of their needs  MONITOR:  Vent status, Weight trends, Labs, I & O's  REASON FOR ASSESSMENT:  Ventilator  ASSESSMENT: 69 year old female presented to [facility] ED 6/30 complaining of fever and abdominal pain x 1 day. Also with LLE rash. Missed dialysis due to malaise and presented to ED. She was found to be hypotensive and bradycardic. Transferred to [facility] for ICU admission.   Pt seen for new vent. BMI indicates normal weight status. Patient is currently intubated on ventilator support MV: 9.1 L/min Temp (24hrs), Avg:95.9 F (35.5 C), Min:93.1 F (33.9 C), Max:98.4 F (36.9 C)  Propofol: none Levophed @ 11 mcg/min, Vasopressin @ 11.3 mL/hr.  Family reports that pt's last meal was 6/29 and she was consuming a Renal diet at home. Pt had initially lost weight but recently has been gaining weight, about 12 lbs in the past few months per family; confirmed by weight hx review.  Pt on CVVHD with hx of ESRD on HD (Tuesday, Thursday, Saturday).  Physical assessment shows moderate muscle and fat wasting. Unable to meet needs; no TF order in place at this time. Medications reviewed. Labs reviewed; CBGs: 33-128 mg/dL, Cl: 99 mmol/L, BUN/creatinine elevated, Ca: 7.8 mg/dL, GFR: 9. Phos 5.1 mg/dL and Mg/K WDL.   Height:  Ht Readings from Last 1 Encounters:  09/12/2014 5\' 4"  (1.626 m)    Weight:  Wt Readings from Last 1 Encounters:  09/24/14 141 lb 1.5 oz (64 kg)    Ideal Body Weight:  54.54 kg (kg)  Wt Readings from Last 10  Encounters:  09/24/14 141 lb 1.5 oz (64 kg)  09/16/14 134 lb (60.782 kg)  04/22/14 146 lb 9.6 oz (66.497 kg)  03/02/14 135 lb (61.236 kg)  02/16/14 135 lb (61.236 kg)  02/14/14 148 lb (67.132 kg)  01/24/14 149 lb (67.586 kg)  01/20/14 140 lb (63.504 kg)  12/28/13 147 lb (66.679 kg)  12/08/13 145 lb 15.1 oz (66.2 kg)  130 (59 kg) admit wt.   BMI:  Body mass index is 24.21 kg/(m^2).  Estimated Nutritional Needs:  Kcal:  1317 Protein:  89-100 (1.5-1.7g) Fluid:  1.5 L/day  Skin:  Wound (see comment) (stage 1 to buttocks, chest and L leg wound)  Diet Order:  Diet NPO time specified  EDUCATION NEEDS:  No education needs identified at this time   Intake/Output Summary (Last 24 hours) at 09/24/14 1348 Last data filed at 09/24/14 1203  Gross per 24 hour  Intake 1405.27 ml  Output   3379 ml  Net -1973.73 ml    Last BM:  7/1  Burtis Junes RD, LDN Nutrition Pager: [phone] 09/24/2014 1:48 PM

## 2014-09-24 NOTE — Procedures (Signed)
Intubation Procedure Note MARIAVICTORIA NOTTINGHAM [id number] [birth year]  Procedure: Intubation Indications: Respiratory insufficiency  Procedure Details Consent: Unable to obtain consent because of emergent medical necessity. Time Out: Verified patient identification, verified procedure, site/side was marked, verified correct patient position, special equipment/implants available, medications/allergies/relevent history reviewed, required imaging and test results available.  Performed  Maximum sterile technique was used including antiseptics, cap, gloves, gown, hand hygiene and mask.  MAC and 3    Evaluation Hemodynamic Status: BP stable throughout; O2 sats: stable throughout Patient's Current Condition: stable Complications: No apparent complications Patient did tolerate procedure well. Chest X-ray ordered to verify placement.  CXR: pending.   Philemon Riedesel,PETE 09/24/2014

## 2014-09-25 ENCOUNTER — Inpatient Hospital Stay (HOSPITAL_COMMUNITY): Payer: Medicare Other

## 2014-09-25 LAB — RENAL FUNCTION PANEL
ANION GAP: 16 — AB (ref 5–15)
Albumin: 2.2 g/dL — ABNORMAL LOW (ref 3.5–5.0)
BUN: 11 mg/dL (ref 6–20)
CO2: 16 mmol/L — ABNORMAL LOW (ref 22–32)
CREATININE: 1.31 mg/dL — AB (ref 0.44–1.00)
Calcium: 7.3 mg/dL — ABNORMAL LOW (ref 8.9–10.3)
Chloride: 99 mmol/L — ABNORMAL LOW (ref 101–111)
GFR calc Af Amer: 47 mL/min — ABNORMAL LOW (ref >60)
GFR, EST NON AFRICAN AMERICAN: 40 mL/min — AB (ref >60)
Glucose, Bld: 181 mg/dL — ABNORMAL HIGH (ref 65–99)
Phosphorus: 4.3 mg/dL (ref 2.5–4.6)
Potassium: 3.8 mmol/L (ref 3.5–5.1)
Sodium: 131 mmol/L — ABNORMAL LOW (ref 135–145)

## 2014-09-25 LAB — PHOSPHORUS
Phosphorus: 1.7 mg/dL — ABNORMAL LOW (ref 2.5–4.6)
Phosphorus: 4.3 mg/dL (ref 2.5–4.6)

## 2014-09-25 LAB — CULTURE, BLOOD (ROUTINE X 2)

## 2014-09-25 LAB — CBC
HCT: 32.2 % — ABNORMAL LOW (ref 36.0–46.0)
HEMOGLOBIN: 10.1 g/dL — AB (ref 12.0–15.0)
MCH: 33.4 pg (ref 26.0–34.0)
MCHC: 31.4 g/dL (ref 30.0–36.0)
MCV: 106.6 fL — ABNORMAL HIGH (ref 78.0–100.0)
Platelets: 57 10*3/uL — ABNORMAL LOW (ref 150–400)
RBC: 3.02 MIL/uL — ABNORMAL LOW (ref 3.87–5.11)
RDW: 15.2 % (ref 11.5–15.5)
WBC: 9.3 10*3/uL (ref 4.0–10.5)

## 2014-09-25 LAB — GLUCOSE, CAPILLARY
GLUCOSE-CAPILLARY: 125 mg/dL — AB (ref 65–99)
GLUCOSE-CAPILLARY: 80 mg/dL (ref 65–99)
Glucose-Capillary: 126 mg/dL — ABNORMAL HIGH (ref 65–99)
Glucose-Capillary: 171 mg/dL — ABNORMAL HIGH (ref 65–99)
Glucose-Capillary: 164 mg/dL — ABNORMAL HIGH (ref 65–99)
Glucose-Capillary: 169 mg/dL — ABNORMAL HIGH (ref 65–99)

## 2014-09-25 LAB — BASIC METABOLIC PANEL
Anion gap: 11 (ref 5–15)
BUN: 13 mg/dL (ref 6–20)
CO2: 23 mmol/L (ref 22–32)
Calcium: 7.9 mg/dL — ABNORMAL LOW (ref 8.9–10.3)
Chloride: 99 mmol/L — ABNORMAL LOW (ref 101–111)
Creatinine, Ser: 1.79 mg/dL — ABNORMAL HIGH (ref 0.44–1.00)
GFR calc non Af Amer: 28 mL/min — ABNORMAL LOW (ref >60)
GFR, EST AFRICAN AMERICAN: 32 mL/min — AB (ref >60)
Glucose, Bld: 150 mg/dL — ABNORMAL HIGH (ref 65–99)
Potassium: 3.9 mmol/L (ref 3.5–5.1)
Sodium: 133 mmol/L — ABNORMAL LOW (ref 135–145)

## 2014-09-25 LAB — ALBUMIN: ALBUMIN: 2.2 g/dL — AB (ref 3.5–5.0)

## 2014-09-25 LAB — MAGNESIUM
Magnesium: 2.2 mg/dL (ref 1.7–2.4)
Magnesium: 2.4 mg/dL (ref 1.7–2.4)

## 2014-09-25 MED ORDER — SODIUM PHOSPHATE 3 MMOLE/ML IV SOLN
30.0000 mmol | Freq: Once | INTRAVENOUS | Status: AC
  Administered 2014-09-25: 30 mmol via INTRAVENOUS
  Filled 2014-09-25: qty 10

## 2014-09-25 MED ORDER — PHENYLEPHRINE HCL 10 MG/ML IJ SOLN
30.0000 ug/min | INTRAVENOUS | Status: DC
  Administered 2014-09-25: 130 ug/min via INTRAVENOUS
  Administered 2014-09-25: 130.133 ug/min via INTRAVENOUS
  Administered 2014-09-25: 50 ug/min via INTRAVENOUS
  Administered 2014-09-25: 130 ug/min via INTRAVENOUS
  Administered 2014-09-26: 120 ug/min via INTRAVENOUS
  Administered 2014-09-26: 80 ug/min via INTRAVENOUS
  Administered 2014-09-26: 110 ug/min via INTRAVENOUS
  Administered 2014-09-27 (×3): 120 ug/min via INTRAVENOUS
  Administered 2014-09-27: 70 ug/min via INTRAVENOUS
  Administered 2014-09-28: 60 ug/min via INTRAVENOUS
  Administered 2014-09-28: 150 ug/min via INTRAVENOUS
  Administered 2014-09-29: 80 ug/min via INTRAVENOUS
  Filled 2014-09-25 (×16): qty 4

## 2014-09-25 NOTE — Progress Notes (Signed)
Assessment:  1 ESRD 2 Gram negative septic shock  Plan: Will continue to support with CVVHD, decrease goal to -50cc/hr  Objective: Vital signs in last 24 hours: Temp:  [97.2 F (36.2 C)-99.5 F (37.5 C)] 97.5 F (36.4 C) (07/03 0836) Pulse Rate:  [61-140] 107 (07/03 0839) Resp:  [11-32] 25 (07/03 0839) BP: (87-129)/(37-72) 103/47 mmHg (07/03 0839) SpO2:  [88 %-100 %] 100 % (07/03 0839) Arterial Line BP: (75-129)/(26-54) 95/47 mmHg (07/03 0700) FiO2 (%):  [40 %] 40 % (07/03 0839) Weight:  [60.9 kg (134 lb 4.2 oz)] 60.9 kg (134 lb 4.2 oz) (07/03 0445) Weight change: -3.1 kg (-6 lb 13.4 oz)  Intake/Output from previous day: 07/02 0701 - 07/03 0700 In: 1855 [I.V.:1035.8; NG/GT:469.2; IV Piggyback:350] Out: 4329  Intake/Output this shift: Total I/O In: -  Out: 390 [Other:390]  General appearance: not awake Resp: clear to auscultation bilaterally Extremities: ecchmotic residua of bullae in BLEs  Lab Results:  Recent Labs  09/23/14 0500 09/25/14 0415  WBC 3.6* 9.3  HGB 10.2* 10.1*  HCT 32.6* 32.2*  PLT 100* 57*   BMET:  Recent Labs  09/24/14 1550 09/25/14 0415  NA 134* 133*  K 4.4 3.9  CL 100* 99*  CO2 19* 23  GLUCOSE 67 150*  BUN 24* 13  CREATININE 2.76* 1.79*  CALCIUM 7.6* 7.9*   No results for input(s): PTH in the last 72 hours. Iron Studies: No results for input(s): IRON, TIBC, TRANSFERRIN, FERRITIN in the last 72 hours. Studies/Results: Portable Chest Xray  09/25/2014   CLINICAL DATA:  Sepsis.  EXAM: PORTABLE CHEST - 1 VIEW  COMPARISON:  09/24/2014.  FINDINGS: Support apparatus: Tip of the endotracheal tube is 45 mm from the carina. Enteric tube remains present. RIGHT IJ central line with the tip in the upper superior vena cava. Monitoring leads project over the chest.  Cardiomediastinal Silhouette: Enlarged, unchanged. Mitral annuloplasty ring and median sternotomy. Aortic arch atherosclerosis.  Lungs: Right-greater-than-left basilar atelectasis  appears similar although there is some positional shift in the RIGHT effusion and associated shifting atelectasis. No pneumothorax. Retrocardiac subsegmental atelectasis.  Effusions:  Small to moderate RIGHT pleural effusion.  Other:  None.  IMPRESSION: 1. Stable support apparatus. 2. Unchanged cardiomegaly. 3. Positional shift in the RIGHT pleural effusion. RIGHT-greater-than-LEFT basilar opacity compatible with atelectasis. Superimposed airspace disease may be present at the RIGHT base.   Electronically Signed   By: Dereck Ligas M.D.   On: 09/25/2014 09:12   Portable Chest Xray  09/24/2014   CLINICAL DATA:  Endotracheal tube placement.  Initial encounter.  EXAM: PORTABLE CHEST - 1 VIEW  COMPARISON:  Chest radiograph performed 09/23/2014  FINDINGS: The patient's endotracheal tube is seen ending 4 cm above the carina.  An enteric tube is noted extending below the diaphragm. A right IJ line is noted ending about the mid SVC.  A small right pleural effusion is seen. Bibasilar airspace opacification likely reflects pulmonary edema, relatively stable from the prior study. No pneumothorax is seen.  The cardiomediastinal silhouette is enlarged. The patient is status post median sternotomy. A valve replacement is noted. No acute osseous abnormalities are identified.  IMPRESSION: 1. Endotracheal tube seen ending 4 cm above the carina. 2. Small right pleural effusion noted. Bibasilar airspace opacification likely reflects pulmonary edema, relatively stable from the recent prior study. Cardiomegaly noted.   Electronically Signed   By: Garald Balding M.D.   On: 09/24/2014 01:48   Dg Chest Port 1 View  09/23/2014   CLINICAL DATA:  Attempted hemodialysis catheter placement. According to study node, this catheter will be removed and another attempted after pneumothorax excluded.  EXAM: PORTABLE CHEST - 1 VIEW  COMPARISON:  Earlier the same date.  FINDINGS: 1231 hr. Pre-existing right IJ central venous catheter appears  unchanged at the mid SVC level. There is a new coiled catheter in the right supraclavicular area, presumably the attempted hemodialysis catheter. This is not clearly intravascular. There is no evidence of pneumothorax. Pulmonary edema, bilateral pleural effusions and bibasilar atelectasis are unchanged. There is stable cardiomegaly status post median sternotomy and valve replacement.  IMPRESSION: No evidence of pneumothorax following attempted hemodialysis catheter placement. The new catheter appears coiled in the right supraclavicular area, not clearly intravascular. Catheter repositioning recommended.  These results will be called to the ordering clinician or representative by the Radiologist Assistant, and communication documented in the PACS or zVision Dashboard.   Electronically Signed   By: Richardean Sale M.D.   On: 09/23/2014 12:44   Dg Tibia/fibula Left Port  09/24/2014   CLINICAL DATA:  Patient presented yesterday with fever, left lower extremity rash and abdominal pain, and was found to be hypotensive, likely secondary to septic shock. Patient now has bruising and blistering to both lower extremities.  EXAM: PORTABLE LEFT TIBIA AND FIBULA - 2 VIEW  COMPARISON:  Left knee x-rays 04/22/2005. No other prior lower extremity imaging.  FINDINGS: Subcutaneous edema diffusely throughout the left lower leg. No evidence of acute or subacute fracture. Bone mineral density relatively well preserved for age. No radiographic evidence of osteomyelitis. Benign-appearing circumscribed lucent focus with sclerotic margins involving the medial aspect of the proximal tibia. Knee joint and ankle joint intact.  IMPRESSION: No acute or significant osseous abnormality.   Electronically Signed   By: Evangeline Dakin M.D.   On: 09/24/2014 14:54   Dg Tibia/fibula Right Port  09/24/2014   CLINICAL DATA:  Patient presented yesterday with fever, left lower extremity rash and abdominal pain, and was found to be hypotensive, likely  secondary to septic shock. Patient now has bruising and blistering to both lower extremities.  EXAM: PORTABLE RIGHT TIBIA AND FIBULA - 2 VIEW  COMPARISON:  None.  FINDINGS: Subcutaneous edema involving the mid lower leg. No evidence of acute or subacute fracture or dislocation. Bone mineral density relatively well preserved for age. No radiographic evidence of osteomyelitis. No intrinsic osseous abnormality. Visualized knee joint and ankle joint intact.  IMPRESSION: No osseous abnormality.   Electronically Signed   By: Evangeline Dakin M.D.   On: 09/24/2014 14:58   Dg Abd Portable 1v  09/24/2014   CLINICAL DATA:  Bedside orogastric tube placement. Current history of end-stage renal disease on hemodialysis. Patient presented 2 days ago with fever, abdominal pain and hypotension.  EXAM: PORTABLE ABDOMEN - 1 VIEW  COMPARISON:  08/20/2013.  FINDINGS: Orogastric tube tip projects over the expected location of the gastric fundus. Bowel gas pattern unremarkable. Right femoral dual lumen dialysis catheter has its tips projected over the expected location of the right common iliac artery. Numerous surgical clips throughout the retroperitoneum and pelvis.  IMPRESSION: 1. Orogastric tube tip projects over the gastric fundus. 2. No acute abdominal abnormality.   Electronically Signed   By: Evangeline Dakin M.D.   On: 09/24/2014 14:03   Scheduled: . antiseptic oral rinse  7 mL Mouth Rinse QID  . chlorhexidine  15 mL Mouth Rinse BID  . famotidine (PEPCID) IV  20 mg Intravenous Q24H  . feeding supplement (PRO-STAT SUGAR FREE 64)  30 mL Per Tube Daily  . heparin  5,000 Units Subcutaneous 3 times per day  . insulin aspart  2-6 Units Subcutaneous 6 times per day  . piperacillin-tazobactam (ZOSYN)  IV  2.25 g Intravenous 4 times per day  . sodium chloride  3 mL Intravenous Q12H  . vancomycin  500 mg Intravenous Q24H     LOS: 3 days   Catrice Zuleta C 09/25/2014,9:33 AM

## 2014-09-25 NOTE — Progress Notes (Signed)
Pt with SVT 160-140 SBP-88-95, sats 100%, CVP= 11 . Dr Chase Caller. Notified see orders to change levo to Neo

## 2014-09-25 NOTE — Progress Notes (Signed)
[facility] Progress Note Patient Name: [name] DOB: 1945/07/08 MRN: [id number]   Date of Service  09/25/2014  HPI/Events of Note  SVT HR 141 while on levophed and vasopressin  eICU Interventions  Change lveophed to neo     Intervention Category Major Interventions: Arrhythmia - evaluation and management  Ellyanna Holton 09/25/2014, 12:45 AM

## 2014-09-25 NOTE — Progress Notes (Signed)
PULMONARY / CRITICAL CARE MEDICINE   Name: [name] MRN: [id number] DOB: 02-11-1946    ADMISSION DATE:  09/10/2014 CONSULTATION DATE:  08/30/2014  REFERRING MD :  Leslye Peer Penn  CHIEF COMPLAINT:  fever  INITIAL PRESENTATION: 69 year old female presented to [facility] ED 6/30 complaining of fever and abdominal pain x 1 day. Also with LLE rash. Missed dialysis due to malaise and presented to ED. She was found to be hypotensive and bradycardic. Transferred to [facility] for ICU admission.   STUDIES:  abd u/s 7/1>>> cirrhosis, multiple gallstones without clear evidence cholecystitis   SIGNIFICANT EVENTS:  7/2>>worsening resp failure, intubated  SUBJECTIVE:   SVT overnight, changed to neo.  Remains on high dose pressors, on CVVH.  Lactate rising.   VITAL SIGNS: Temp:  [97.2 F (36.2 C)-99.5 F (37.5 C)] 97.5 F (36.4 C) (07/03 0836) Pulse Rate:  [63-140] 107 (07/03 0839) Resp:  [11-32] 25 (07/03 0839) BP: (87-129)/(37-72) 103/47 mmHg (07/03 0839) SpO2:  [88 %-100 %] 100 % (07/03 0839) Arterial Line BP: (75-129)/(26-54) 95/47 mmHg (07/03 0700) FiO2 (%):  [40 %] 40 % (07/03 0839) Weight:  [134 lb 4.2 oz (60.9 kg)] 134 lb 4.2 oz (60.9 kg) (07/03 0445) HEMODYNAMICS: CVP:  [11 mmHg-17 mmHg] 17 mmHg VENTILATOR SETTINGS: Vent Mode:  [-] PSV;CPAP FiO2 (%):  [40 %] 40 % Set Rate:  [14 bmp-22 bmp] 22 bmp Vt Set:  [440 mL] 440 mL PEEP:  [5 cmH20] 5 cmH20 Pressure Support:  [10 cmH20] 10 cmH20 Plateau Pressure:  [22 DJT70-17 cmH20] 26 cmH20 INTAKE / OUTPUT:  Intake/Output Summary (Last 24 hours) at 09/25/14 1024 Last data filed at 09/25/14 1000  Gross per 24 hour  Intake 1590.37 ml  Output   4350 ml  Net -2759.63 ml    PHYSICAL EXAMINATION: General:  Elderly female in NAD on vent  Neuro:  Somnolent on vent, opens eyes to voice, does not follow commands  HEENT:  [place]/AT, no JVD noted, PERRL Cardiovascular:  RRR, no MRG Lungs:  resps even non labored on vent, clear bilateral breath  sounds Abdomen:  Soft, non-tender, non-distended Musculoskeletal:  No acute deformity Skin:  BLE rash. L > R. Severe erythema, blistered., dark areas   LABS:  CBC  Recent Labs Lab 09/23/14 0140 09/23/14 0500 09/25/14 0415  WBC 3.6* 3.6* 9.3  HGB 10.7* 10.2* 10.1*  HCT 33.9* 32.6* 32.2*  PLT 111* 100* 57*   Coag's  Recent Labs Lab 09/23/14 0140  APTT 34  INR 1.74*   BMET  Recent Labs Lab 09/24/14 0423 09/24/14 1550 09/25/14 0415  NA 134* 134* 133*  K 4.9 4.4 3.9  CL 99* 100* 99*  CO2 17* 19* 23  BUN 38* 24* 13  CREATININE 4.47* 2.76* 1.79*  GLUCOSE 75 67 150*   Electrolytes  Recent Labs Lab 09/24/14 0423 09/24/14 1550 09/24/14 2235 09/25/14 0415  CALCIUM 7.8* 7.6*  --  7.9*  MG 2.1  --  2.2 2.2  PHOS 5.1* 3.2 1.9* 1.7*   Sepsis Markers  Recent Labs Lab 09/17/2014 1556 09/23/14 0140 09/24/14 1410  LATICACIDVEN 3.69* 3.9* 5.2*  PROCALCITON  --  31.16  --    ABG  Recent Labs Lab 09/24/14 0029 09/24/14 0235 09/24/14 1643  PHART 7.162* 7.264* 7.359  PCO2ART 64.8* 41.8 37.1  PO2ART 99.0 163* 123.0*   Liver Enzymes  Recent Labs Lab 09/09/2014 1545 09/23/14 0140  09/24/14 0423 09/24/14 1550 09/25/14 0415  AST 24 31  --   --   --   --  ALT 15 17  --   --   --   --   ALKPHOS 94 92  --   --   --   --   BILITOT 1.5* 2.6*  --   --   --   --   ALBUMIN 3.0* 2.8*  < > 2.6* 2.4* 2.2*  < > = values in this interval not displayed. Cardiac Enzymes  Recent Labs Lab 09/23/14 0140 09/23/14 0500 09/23/14 1030  TROPONINI 0.11* 0.12* 0.17*   Glucose  Recent Labs Lab 09/24/14 1641 09/24/14 1709 09/24/14 1935 09/24/14 2336 09/25/14 0342 09/25/14 0840  GLUCAP 65 113* 104* 125* 126* 171*    Imaging Portable Chest Xray  09/25/2014   CLINICAL DATA:  Sepsis.  EXAM: PORTABLE CHEST - 1 VIEW  COMPARISON:  09/24/2014.  FINDINGS: Support apparatus: Tip of the endotracheal tube is 45 mm from the carina. Enteric tube remains present. RIGHT IJ  central line with the tip in the upper superior vena cava. Monitoring leads project over the chest.  Cardiomediastinal Silhouette: Enlarged, unchanged. Mitral annuloplasty ring and median sternotomy. Aortic arch atherosclerosis.  Lungs: Right-greater-than-left basilar atelectasis appears similar although there is some positional shift in the RIGHT effusion and associated shifting atelectasis. No pneumothorax. Retrocardiac subsegmental atelectasis.  Effusions:  Small to moderate RIGHT pleural effusion.  Other:  None.  IMPRESSION: 1. Stable support apparatus. 2. Unchanged cardiomegaly. 3. Positional shift in the RIGHT pleural effusion. RIGHT-greater-than-LEFT basilar opacity compatible with atelectasis. Superimposed airspace disease may be present at the RIGHT base.   Electronically Signed   By: Dereck Ligas M.D.   On: 09/25/2014 09:12   Dg Tibia/fibula Left Port  09/24/2014   CLINICAL DATA:  Patient presented yesterday with fever, left lower extremity rash and abdominal pain, and was found to be hypotensive, likely secondary to septic shock. Patient now has bruising and blistering to both lower extremities.  EXAM: PORTABLE LEFT TIBIA AND FIBULA - 2 VIEW  COMPARISON:  Left knee x-rays 04/22/2005. No other prior lower extremity imaging.  FINDINGS: Subcutaneous edema diffusely throughout the left lower leg. No evidence of acute or subacute fracture. Bone mineral density relatively well preserved for age. No radiographic evidence of osteomyelitis. Benign-appearing circumscribed lucent focus with sclerotic margins involving the medial aspect of the proximal tibia. Knee joint and ankle joint intact.  IMPRESSION: No acute or significant osseous abnormality.   Electronically Signed   By: Evangeline Dakin M.D.   On: 09/24/2014 14:54   Dg Tibia/fibula Right Port  09/24/2014   CLINICAL DATA:  Patient presented yesterday with fever, left lower extremity rash and abdominal pain, and was found to be hypotensive, likely  secondary to septic shock. Patient now has bruising and blistering to both lower extremities.  EXAM: PORTABLE RIGHT TIBIA AND FIBULA - 2 VIEW  COMPARISON:  None.  FINDINGS: Subcutaneous edema involving the mid lower leg. No evidence of acute or subacute fracture or dislocation. Bone mineral density relatively well preserved for age. No radiographic evidence of osteomyelitis. No intrinsic osseous abnormality. Visualized knee joint and ankle joint intact.  IMPRESSION: No osseous abnormality.   Electronically Signed   By: Evangeline Dakin M.D.   On: 09/24/2014 14:58   Dg Abd Portable 1v  09/24/2014   CLINICAL DATA:  Bedside orogastric tube placement. Current history of end-stage renal disease on hemodialysis. Patient presented 2 days ago with fever, abdominal pain and hypotension.  EXAM: PORTABLE ABDOMEN - 1 VIEW  COMPARISON:  08/20/2013.  FINDINGS: Orogastric tube tip projects over  the expected location of the gastric fundus. Bowel gas pattern unremarkable. Right femoral dual lumen dialysis catheter has its tips projected over the expected location of the right common iliac artery. Numerous surgical clips throughout the retroperitoneum and pelvis.  IMPRESSION: 1. Orogastric tube tip projects over the gastric fundus. 2. No acute abdominal abnormality.   Electronically Signed   By: Evangeline Dakin M.D.   On: 09/24/2014 14:03     ASSESSMENT / PLAN:  PULMONARY A: Acute respiratory failure - r/t AMS, sepsis  bilat effusions pulm edema P:   Vent support - 8cc/kg  F/u CXR  F/u ABG   CARDIOVASCULAR CVL 7/1 >>> A:  Septic shock  Bradycardia H/o HTN, CAD SVT - resolved P:  MAP goal > 49mm/hg Neo gtt - wean as able  CVP monitoring Holding outpatient antihypertensives F/u lactate    RENAL A:   ESRD on HD (TTS) Hyperkalemia Hypophosphatemia  Hyponatremia  P:   CVVHD per renal  Follow Bmet Correct lytes as indicated   GASTROINTESTINAL A:   ABD pain, suspect in setting of  hypotension P:   NPO IV protonix  HEMATOLOGIC A:   Anemia of chronic illness Thrombocytopenia  P:  Follow CBC Transfuse per ICU guidelines  INFECTIOUS A:   Septic shock, suspect cellulitic source LLE Leukopenia  P:   BCx2 7/1 >GNR 2/2>>>EColi (RES cipro, SENS ceftaz, pip/tazo, imipenem) Vancomycin 6/30 >>> pip/tazo 6/30 >>>  D/c vanc  Trend lactate, pct  BLE exam seems to be worsening but not clear that she would tolerate MRI and highly doubt she would tolerate surgical intervention of any kind.    ENDOCRINE A:   DM P:   CBG monitoring and SSI   NEUROLOGIC A:   Acute metabolic encephalopathy P:   RASS goal: -1 Prn fentanyl  Daily WUA    FAMILY  - Updates: daughter updated 7/3.  Discussed ongoing pressors needs, rising lactate despite appropriate abx.  Note discussion yesterday with Dr. Titus Mould, now DNR.  Suspect she will cont to deteriorate without definitive/surgical intervention which she does not want.  Started discussions regarding transition to comfort.  Daughter (youngest) was tearful but agreed that pt would likely "not want all this" and she will discuss with other family who will arrive later.    - Inter-disciplinary family meet or Palliative Care meeting due by:  09/30/2014   Nickolas Madrid, NP 09/25/2014  10:24 AM Pager: [phone] or [phone]  STAFF NOTE: Linwood Dibbles, MD FACP have personally reviewed patient's available data, including medical history, events of note, physical examination and test results as part of my evaluation. I have discussed with resident/NP and other care providers such as pharmacist, RN and RRT. In addition, I personally evaluated patient and elicited key findings of: more responsive some improvemnt in MAP and pressor needs, LActic concerning, concerns still exists for [employer], pt husband stated she would not want any surgical approach, keep clinda for toxin inhibition, maintain neg balance on cvvhd, pcxr  improved , prognosis poor, would support comfort care if family sought, phos replacement, SBT attempt ok todday The patient is critically ill with multiple organ systems failure and requires high complexity decision making for assessment and support, frequent evaluation and titration of therapies, application of advanced monitoring technologies and extensive interpretation of multiple databases.   Critical Care Time devoted to patient care services described in this note is 30 Minutes. This time reflects time of care of this signee: Merrie Roof, MD FACP. This critical  care time does not reflect procedure time, or teaching time or supervisory time of PA/NP/Med student/Med Resident etc but could involve care discussion time. Rest per NP/medical resident whose note is outlined above and that I agree with   Lavon Paganini. Titus Mould, MD, [place] Pgr: [facility] 09/25/2014 2:51 PM

## 2014-09-26 LAB — RENAL FUNCTION PANEL
ALBUMIN: 2 g/dL — AB (ref 3.5–5.0)
Albumin: 2.2 g/dL — ABNORMAL LOW (ref 3.5–5.0)
Anion gap: 15 (ref 5–15)
Anion gap: 10 (ref 5–15)
BUN: 15 mg/dL (ref 6–20)
BUN: 19 mg/dL (ref 6–20)
CALCIUM: 7.8 mg/dL — AB (ref 8.9–10.3)
CHLORIDE: 99 mmol/L — AB (ref 101–111)
CO2: 19 mmol/L — ABNORMAL LOW (ref 22–32)
CO2: 24 mmol/L (ref 22–32)
Calcium: 7.6 mg/dL — ABNORMAL LOW (ref 8.9–10.3)
Chloride: 96 mmol/L — ABNORMAL LOW (ref 101–111)
Creatinine, Ser: 1.29 mg/dL — ABNORMAL HIGH (ref 0.44–1.00)
Creatinine, Ser: 1.17 mg/dL — ABNORMAL HIGH (ref 0.44–1.00)
GFR calc Af Amer: 53 mL/min — ABNORMAL LOW (ref >60)
GFR calc non Af Amer: 41 mL/min — ABNORMAL LOW (ref >60)
GFR calc non Af Amer: 46 mL/min — ABNORMAL LOW (ref >60)
GFR, EST AFRICAN AMERICAN: 47 mL/min — AB (ref >60)
Glucose, Bld: 272 mg/dL — ABNORMAL HIGH (ref 65–99)
Glucose, Bld: 262 mg/dL — ABNORMAL HIGH (ref 65–99)
PHOSPHORUS: 2.6 mg/dL (ref 2.5–4.6)
POTASSIUM: 3.7 mmol/L (ref 3.5–5.1)
POTASSIUM: 3.5 mmol/L (ref 3.5–5.1)
Phosphorus: 1.7 mg/dL — ABNORMAL LOW (ref 2.5–4.6)
SODIUM: 130 mmol/L — AB (ref 135–145)
SODIUM: 133 mmol/L — AB (ref 135–145)

## 2014-09-26 LAB — CBC
HEMATOCRIT: 33 % — AB (ref 36.0–46.0)
Hemoglobin: 10.6 g/dL — ABNORMAL LOW (ref 12.0–15.0)
MCH: 33.2 pg (ref 26.0–34.0)
MCHC: 32.1 g/dL (ref 30.0–36.0)
MCV: 103.4 fL — ABNORMAL HIGH (ref 78.0–100.0)
Platelets: 39 10*3/uL — ABNORMAL LOW (ref 150–400)
RBC: 3.19 MIL/uL — ABNORMAL LOW (ref 3.87–5.11)
RDW: 14.8 % (ref 11.5–15.5)
WBC: 12.8 10*3/uL — AB (ref 4.0–10.5)

## 2014-09-26 LAB — GLUCOSE, CAPILLARY
Glucose-Capillary: 131 mg/dL — ABNORMAL HIGH (ref 65–99)
Glucose-Capillary: 177 mg/dL — ABNORMAL HIGH (ref 65–99)
Glucose-Capillary: 274 mg/dL — ABNORMAL HIGH (ref 65–99)
Glucose-Capillary: 275 mg/dL — ABNORMAL HIGH (ref 65–99)
Glucose-Capillary: 302 mg/dL — ABNORMAL HIGH (ref 65–99)

## 2014-09-26 LAB — PHOSPHORUS
PHOSPHORUS: 1.7 mg/dL — AB (ref 2.5–4.6)
Phosphorus: 2.6 mg/dL (ref 2.5–4.6)

## 2014-09-26 LAB — LACTIC ACID, PLASMA: Lactic Acid, Venous: 2.5 mmol/L (ref 0.5–2.0)

## 2014-09-26 LAB — MAGNESIUM
MAGNESIUM: 2.2 mg/dL (ref 1.7–2.4)
Magnesium: 2.4 mg/dL (ref 1.7–2.4)

## 2014-09-26 MED ORDER — SODIUM CHLORIDE 0.9 % IV SOLN
3.0000 g | Freq: Three times a day (TID) | INTRAVENOUS | Status: DC
  Administered 2014-09-26 – 2014-09-27 (×4): 3 g via INTRAVENOUS
  Filled 2014-09-26 (×5): qty 3

## 2014-09-26 MED ORDER — DARBEPOETIN ALFA 40 MCG/0.4ML IJ SOSY
40.0000 ug | PREFILLED_SYRINGE | INTRAMUSCULAR | Status: DC
  Administered 2014-09-26: 40 ug via SUBCUTANEOUS
  Filled 2014-09-26: qty 0.4

## 2014-09-26 NOTE — Care Management (Signed)
Important Message  Patient Details  Name: [name] MRN: [id number] Date of Birth: 07-08-1945   Medicare Important Message Given:  Yes-second notification given    Loann Quill 09/26/2014, 9:20 AM

## 2014-09-26 NOTE — Progress Notes (Signed)
PULMONARY / CRITICAL CARE MEDICINE   Name: [name] MRN: [id number] DOB: 09/23/1945    ADMISSION DATE:  09/15/2014 CONSULTATION DATE:  08/28/2014  REFERRING MD :  Leslye Peer Penn  CHIEF COMPLAINT:  fever  INITIAL PRESENTATION: 69 year old female presented to [facility] ED 6/30 complaining of fever and abdominal pain x 1 day. Also with LLE rash. Missed dialysis due to malaise and presented to ED. She was found to be hypotensive and bradycardic. Transferred to [facility] for ICU admission.   STUDIES:  abd u/s 7/1>>> cirrhosis, multiple gallstones without clear evidence cholecystitis   SIGNIFICANT EVENTS:  7/2>>worsening resp failure, intubated  SUBJECTIVE:   On CVVH. Continues to need pressors. Has begun to attempt weaning off.  Lactate pending, but was raising. Continues to be somnolent on vent.   VITAL SIGNS: Temp:  [94.5 F (34.7 C)-97.8 F (36.6 C)] 97.5 F (36.4 C) (07/04 0746) Pulse Rate:  [57-103] 70 (07/04 0800) Resp:  [18-68] 26 (07/04 0800) BP: (90-117)/(37-75) 96/44 mmHg (07/04 0800) SpO2:  [98 %-100 %] 100 % (07/04 0800) Arterial Line BP: (98-117)/(37-45) 103/38 mmHg (07/04 0800) FiO2 (%):  [40 %] 40 % (07/04 0800) Weight:  [134 lb 11.2 oz (61.1 kg)] 134 lb 11.2 oz (61.1 kg) (07/04 0348) HEMODYNAMICS: CVP:  [11 mmHg-16 mmHg] 15 mmHg VENTILATOR SETTINGS: Vent Mode:  [-] PSV FiO2 (%):  [40 %] 40 % Set Rate:  [22 bmp] 22 bmp Vt Set:  [440 mL] 440 mL PEEP:  [5 cmH20] 5 cmH20 Pressure Support:  [10 cmH20] 10 cmH20 Plateau Pressure:  [21 cmH20-27 cmH20] 27 cmH20 INTAKE / OUTPUT:  Intake/Output Summary (Last 24 hours) at 09/26/14 0935 Last data filed at 09/26/14 0800  Gross per 24 hour  Intake 3062.13 ml  Output   4042 ml  Net -979.87 ml    PHYSICAL EXAMINATION: General -- elderly; NAD; intubated HEENT -- Head is normocephalic. PERRLA. Neck -- supple; no JBLE rash. L > R. Dark bullae present and dressedVD; HD port in place Integument -- BLE rash. L > R. Dark  bullae present and dressed Chest -- Intubated; good expansion. Lungs clear to auscultation. Cardiac -- RRR. No murmurs noted.  Abdomen -- soft, nontender. No masses palpable. Bowel sounds present. CNS -- Somnolent, opens eyes when name is called, does not follow commands  Extremeties -- No deformity; Dorsalis pedis pulses present and symmetrical.    LABS:  CBC  Recent Labs Lab 09/23/14 0500 09/25/14 0415 09/26/14 0400  WBC 3.6* 9.3 12.8*  HGB 10.2* 10.1* 10.6*  HCT 32.6* 32.2* 33.0*  PLT 100* 57* 39*   Coag's  Recent Labs Lab 09/23/14 0140  APTT 34  INR 1.74*   BMET  Recent Labs Lab 09/25/14 0415 09/25/14 1634 09/26/14 0400  NA 133* 131* 130*  K 3.9 3.8 3.7  CL 99* 99* 96*  CO2 23 16* 19*  BUN 13 11 15   CREATININE 1.79* 1.31* 1.29*  GLUCOSE 150* 181* 272*   Electrolytes  Recent Labs Lab 09/25/14 0415 09/25/14 1634 09/26/14 0400  CALCIUM 7.9* 7.3* 7.6*  MG 2.2 2.4 2.2  PHOS 1.7* 4.3  4.3 2.6  2.6   Sepsis Markers  Recent Labs Lab 09/20/2014 1556 09/23/14 0140 09/24/14 1410  LATICACIDVEN 3.69* 3.9* 5.2*  PROCALCITON  --  31.16  --    ABG  Recent Labs Lab 09/24/14 0029 09/24/14 0235 09/24/14 1643  PHART 7.162* 7.264* 7.359  PCO2ART 64.8* 41.8 37.1  PO2ART 99.0 163* 123.0*   Liver  Enzymes  Recent Labs Lab 09/16/2014 1545 09/23/14 0140  09/25/14 0415 09/25/14 1634 09/26/14 0400  AST 24 31  --   --   --   --   ALT 15 17  --   --   --   --   ALKPHOS 94 92  --   --   --   --   BILITOT 1.5* 2.6*  --   --   --   --   ALBUMIN 3.0* 2.8*  < > 2.2* 2.2* 2.2*  < > = values in this interval not displayed. Cardiac Enzymes  Recent Labs Lab 09/23/14 0140 09/23/14 0500 09/23/14 1030  TROPONINI 0.11* 0.12* 0.17*   Glucose  Recent Labs Lab 09/25/14 1154 09/25/14 1618 09/25/14 1957 09/26/14 0008 09/26/14 0337 09/26/14 0745  GLUCAP 164* 169* 80 131* 177* 302*    Imaging No results found.   ASSESSMENT /  PLAN:  PULMONARY A: Acute respiratory failure - r/t AMS, sepsis  bilat effusions R>L on CXR 7/3  pulm edema P:   Intubated 7/2 Vent support - consider wean later today  CARDIOVASCULAR CVL 7/1 >>> A:  Septic shock  Bradycardia H/o HTN, CAD SVT - resolved P:  Change to sys goal 90 Neo gtt - wean as able  Vasopressin, wean to off, follow neo requirements CVP 10 F/u lactate - down  RENAL A:   ESRD on HD (TTS) Hyperkalemia  Hypophosphatemia Hyponatremia  P:   CVVHD per renal  Follow Bmet Correct electrolytes as needed Neg balance goals  GASTROINTESTINAL A:   ABD pain, suspect in setting of hypotension P:   NPO IV protonix  HEMATOLOGIC A:   Anemia of chronic illness (Hgb 10.6) Thrombocytopenia (sepsis), unlikely HIT (day 4) P:  Follow CBC Dc heparin follow plat trend  INFECTIOUS A:   Septic shock, suspect cellulitic source LLE  - ?necrotizing fasciitis  Leukopenia (resolved) Leukocytosis (12.8, 7/4)  P:   BCx2 7/1 >GNR 2/2>>>EColi (RES cipro, SENS ceftaz, pip/tazo, imipenem) Vancomycin 6/30 >>> 7/3 pip/tazo 6/30 >>> Trend lactate, f/u BLE exam seems to be worsening but not clear that she would tolerate MRI and highly doubt she would tolerate surgical intervention of any kind. Husband does not want   Consider transition to unasyn  ENDOCRINE A:   DM P:   CBG monitoring and SSI  - D5 as needed  NEUROLOGIC A:   Acute metabolic encephalopathy P:   RASS goal: -1 Prn fentanyl  Daily WUA    FAMILY  - Updates: daughter updated 7/3.  Now DNR. Suspect she will continue to deteriorate. Will continue to inform family of our plans when available.    - Inter-disciplinary family meet or Palliative Care meeting due by:  09/30/2014   Elberta Leatherwood, MD,MS,  PGY2 09/26/2014 9:35 AM   STAFF NOTE: Linwood Dibbles, MD FACP have personally reviewed patient's available data, including medical history, events of note, physical examination and test  results as part of my evaluation. I have discussed with resident/NP and other care providers such as pharmacist, RN and RRT. In addition, I personally evaluated patient and elicited key findings of: wounds unchanged, weaning today, low dose neo required, e coli noted, change to unasyn, dc zosyn, need to discuss reintubation when plan extubation, repeat BC today, family updated in full by me, cvvhd on going, even per renal today as goal, need to limit duration of aggressive support, if neo up  after vaso off then restart vaso The patient is critically ill  with multiple organ systems failure and requires high complexity decision making for assessment and support, frequent evaluation and titration of therapies, application of advanced monitoring technologies and extensive interpretation of multiple databases.   Critical Care Time devoted to patient care services described in this note is 30 Minutes. This time reflects time of care of this signee: Merrie Roof, MD FACP. This critical care time does not reflect procedure time, or teaching time or supervisory time of PA/NP/Med student/Med Resident etc but could involve care discussion time. Rest per NP/medical resident whose note is outlined above and that I agree with   Lavon Paganini. Titus Mould, MD, [place] Pgr: [facility] 09/26/2014 10:33 AM

## 2014-09-26 NOTE — Progress Notes (Signed)
ANTIBIOTIC CONSULT NOTE - FOLLOW UP  Pharmacy Consult for Unasyn Indication: Unasyn  Allergies  Allergen Reactions  . Lisinopril Swelling  . Ezetimibe Swelling    ZETIA: Tongue swelling    Patient Measurements: Height: 5\' 4"  (162.6 cm) Weight: 134 lb 11.2 oz (61.1 kg) IBW/kg (Calculated) : 54.7  Vital Signs: Temp: 97.5 F (36.4 C) (07/04 0746) Temp Source: Oral (07/04 0746) BP: 96/44 mmHg (07/04 0800) Pulse Rate: 70 (07/04 0800) Intake/Output from previous day: 07/03 0701 - 07/04 0700 In: 0175 [I.V.:1629.6; NG/GT:959.4; IV Piggyback:671] Out: 4286  Intake/Output from this shift: Total I/O In: 112.7 [I.V.:67.7; NG/GT:45] Out: 339 [Other:339]  Labs:  Recent Labs  09/25/14 0415 09/25/14 1634 09/26/14 0400  WBC 9.3  --  12.8*  HGB 10.1*  --  10.6*  PLT 57*  --  39*  CREATININE 1.79* 1.31* 1.29*   Estimated Creatinine Clearance: 35 mL/min (by C-G formula based on Cr of 1.29). No results for input(s): VANCOTROUGH, VANCOPEAK, VANCORANDOM, GENTTROUGH, GENTPEAK, GENTRANDOM, TOBRATROUGH, TOBRAPEAK, TOBRARND, AMIKACINPEAK, AMIKACINTROU, AMIKACIN in the last 72 hours.   Microbiology: Recent Results (from the past 720 hour(s))  Culture, blood (routine x 2)     Status: None   Collection Time: 09/10/2014  3:45 PM  Result Value Ref Range Status   Specimen Description BLOOD LEFT HAND DRAWN BY RN BB  Final   Special Requests BOTTLES DRAWN AEROBIC AND ANAEROBIC 5CC EACH  Final   Culture  Setup Time   Final    GRAM NEGATIVE RODS IN BOTH AEROBIC AND ANAEROBIC BOTTLES CRITICAL RESULT CALLED TO, READ BACK BY AND VERIFIED WITH: Vivianne Spence AT [place] ON [id number] BY Mikel Cella K Performed at [facility]    Culture   Final    ESCHERICHIA COLI SUSCEPTIBILITIES PERFORMED ON PREVIOUS CULTURE WITHIN THE LAST 5 DAYS. Performed at [facility]    Report Status 09/25/2014 FINAL  Final  Culture, blood (routine x 2)     Status: None   Collection Time: 08/31/2014  4:00 PM  Result  Value Ref Range Status   Specimen Description BLOOD LEFT ANTECUBITAL  Final   Special Requests BOTTLES DRAWN AEROBIC AND ANAEROBIC 5CC EACH  Final   Culture  Setup Time   Final    GRAM NEGATIVE RODS IN BOTH AEROBIC AND ANAEROBIC BOTTLES CRITICAL RESULT CALLED TO, READ BACK BY AND VERIFIED WITH: MOORE K AT [facility] AT 0408 ON [id number] BY FORSYTH K    Culture   Final    ESCHERICHIA COLI Performed at [facility]    Report Status 09/25/2014 FINAL  Final   Organism ID, Bacteria ESCHERICHIA COLI  Final      Susceptibility   Escherichia coli - MIC*    AMPICILLIN >=32 RESISTANT Resistant     CEFAZOLIN <=4 SENSITIVE Sensitive     CEFEPIME <=1 SENSITIVE Sensitive     CEFTAZIDIME <=1 SENSITIVE Sensitive     CEFTRIAXONE <=1 SENSITIVE Sensitive     CIPROFLOXACIN >=4 RESISTANT Resistant     GENTAMICIN >=16 RESISTANT Resistant     IMIPENEM <=0.25 SENSITIVE Sensitive     TRIMETH/SULFA >=320 RESISTANT Resistant     AMPICILLIN/SULBACTAM 8 SENSITIVE Sensitive     PIP/TAZO <=4 SENSITIVE Sensitive     * ESCHERICHIA COLI  MRSA PCR Screening     Status: None   Collection Time: 08/31/2014  8:38 PM  Result Value Ref Range Status   MRSA by PCR NEGATIVE NEGATIVE Final    Comment:  The GeneXpert MRSA Assay (FDA approved for NASAL specimens only), is one component of a comprehensive MRSA colonization surveillance program. It is not intended to diagnose MRSA infection nor to guide or monitor treatment for MRSA infections.   Culture, blood (routine x 2)     Status: None   Collection Time: 09/23/14 12:47 AM  Result Value Ref Range Status   Specimen Description BLOOD LEFT HAND  Final   Special Requests IN PEDIATRIC BOTTLE 2CC  Final   Culture  Setup Time   Final    GRAM NEGATIVE RODS AEROBIC BOTTLE ONLY CRITICAL RESULT CALLED TO, READ BACK BY AND VERIFIED WITH: I BAYNES 09/23/14 @ 69 M VESTAL    Culture   Final    ESCHERICHIA COLI SUSCEPTIBILITIES PERFORMED ON PREVIOUS CULTURE WITHIN THE  LAST 5 DAYS.    Report Status 09/25/2014 FINAL  Final  Culture, blood (routine x 2)     Status: None   Collection Time: 09/23/14  1:40 AM  Result Value Ref Range Status   Specimen Description BLOOD CENTRAL LINE  Final   Special Requests BOTTLES DRAWN AEROBIC AND ANAEROBIC 10CC EACH  Final   Culture  Setup Time   Final    GRAM NEGATIVE RODS IN BOTH AEROBIC AND ANAEROBIC BOTTLES CRITICAL RESULT CALLED TO, READ BACK BY AND VERIFIED WITH: A. BAYNES, RN AT 1013 ON [id number] BY S. YARBROUGH    Culture   Final    ESCHERICHIA COLI SUSCEPTIBILITIES PERFORMED ON PREVIOUS CULTURE WITHIN THE LAST 5 DAYS.    Report Status 09/25/2014 FINAL  Final    Anti-infectives    Start     Dose/Rate Route Frequency Ordered Stop   09/26/14 1045  Ampicillin-Sulbactam (UNASYN) 3 g in sodium chloride 0.9 % 100 mL IVPB     3 g 100 mL/hr over 60 Minutes Intravenous Every 8 hours 09/26/14 1044     09/24/14 0800  vancomycin (VANCOCIN) 500 mg in sodium chloride 0.9 % 100 mL IVPB  Status:  Discontinued     500 mg 100 mL/hr over 60 Minutes Intravenous Every 24 hours 09/23/14 1453 09/25/14 1052   09/23/14 1800  piperacillin-tazobactam (ZOSYN) 2.25 g in dextrose 5 % 50 mL IVPB  Status:  Discontinued     2.25 g 100 mL/hr over 30 Minutes Intravenous 4 times per day 09/23/14 1453 09/26/14 1043   08/26/2014 1900  piperacillin-tazobactam (ZOSYN) 2.25 g in dextrose 5 % 50 mL IVPB  Status:  Discontinued     2.25 g 100 mL/hr over 30 Minutes Intravenous Every 8 hours 09/12/2014 1823 09/23/14 1453   09/03/2014 1830  vancomycin (VANCOCIN) IVPB 1000 mg/200 mL premix     1,000 mg 200 mL/hr over 60 Minutes Intravenous  Once 09/01/2014 1823 08/24/2014 1940      Assessment: 69 year old woman admitted sepsis/cellulitis found to have [place] in 4 blood cultures.   CRRT continues.  Narrowing antibiotics to Unasyn today.  Plan:  Unasyn 3g IV q8h while on CRRT  Candie Mile 09/26/2014,11:13 AM

## 2014-09-26 NOTE — Progress Notes (Signed)
Patient ID: [name], female   DOB: 23-Sep-1945, 69 y.o.   MRN: [id number]  [facility] Progress Note   Assessment/ Plan:   1. Escherichia coli sepsis: On intravenous Zosyn based on sensitivity data, remains on pressors with attempts at weaning the dose. 2.ESRD: Given her pressor dependent hypotension, continue CRRT. The goal is primarily to provide clearance and regulate her metabolic abnormalities. Okay to keep even for the next 10-12 hours in order to promote weaning down pressors. She is euvolemic on physical exam. 3. 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 VDRF: Per critical care service  Subjective:   No acute events overnight-doing well on CRRT    Objective:   BP 103/38 mmHg  Pulse 65  Temp(Src) 97.5 F (36.4 C) (Oral)  Resp 68  Ht 5\' 4"  (1.626 m)  Wt 61.1 kg (134 lb 11.2 oz)  BMI 23.11 kg/m2  SpO2 100%  Physical Exam: Gen: Intubated, sedated CVS: Pulse regular in rate and rhythm, S1 and S2 normal Resp: Coarse/mechanical breath sounds bilaterally Abd: Soft, flat, nontender Ext: Lower extremities with large hemorrhagic bullae with dressing placed over it. No edema  Labs: BMET  Recent Labs Lab 09/23/14 0500 09/23/14 1645 09/24/14 0423 09/24/14 1550 09/24/14 2235 09/25/14 0415 09/25/14 1634 09/26/14 0400  NA 140 140 134* 134*  --  133* 131* 130*  K 5.3* 5.6* 4.9 4.4  --  3.9 3.8 3.7  CL 100* 100* 99* 100*  --  99* 99* 96*  CO2 25 24 17* 19*  --  23 16* 19*  GLUCOSE 198* 84 75 67  --  150* 181* 272*  BUN 56* 65* 38* 24*  --  13 11 15   CREATININE 8.55* 8.62* 4.47* 2.76*  --  1.79* 1.31* 1.29*  CALCIUM 8.2* 7.9* 7.8* 7.6*  --  7.9* 7.3* 7.6*  PHOS 6.9* 8.9* 5.1* 3.2 1.9* 1.7* 4.3  4.3 2.6  2.6   CBC  Recent Labs Lab 08/28/2014 1545 09/23/14 0140 09/23/14 0500 09/25/14 0415 09/26/14 0400  WBC 3.2* 3.6* 3.6* 9.3  12.8*  NEUTROABS 2.8 3.0  --   --   --   HGB 10.5* 10.7* 10.2* 10.1* 10.6*  HCT 33.0* 33.9* 32.6* 32.2* 33.0*  MCV 108.6* 107.3* 107.9* 106.6* 103.4*  PLT 127* 111* 100* 57* 39*   Medications:    . antiseptic oral rinse  7 mL Mouth Rinse QID  . chlorhexidine  15 mL Mouth Rinse BID  . famotidine (PEPCID) IV  20 mg Intravenous Q24H  . feeding supplement (PRO-STAT SUGAR FREE 64)  30 mL Per Tube Daily  . heparin  5,000 Units Subcutaneous 3 times per day  . insulin aspart  2-6 Units Subcutaneous 6 times per day  . piperacillin-tazobactam (ZOSYN)  IV  2.25 g Intravenous 4 times per day  . sodium chloride  3 mL Intravenous Q12H   Elmarie Shiley, MD 09/26/2014, 7:48 AM

## 2014-09-27 DIAGNOSIS — R7881 Bacteremia: Secondary | ICD-10-CM

## 2014-09-27 DIAGNOSIS — J9600 Acute respiratory failure, unspecified whether with hypoxia or hypercapnia: Secondary | ICD-10-CM

## 2014-09-27 LAB — GLUCOSE, CAPILLARY
GLUCOSE-CAPILLARY: 224 mg/dL — AB (ref 65–99)
GLUCOSE-CAPILLARY: 274 mg/dL — AB (ref 65–99)
GLUCOSE-CAPILLARY: 160 mg/dL — AB (ref 65–99)
Glucose-Capillary: 209 mg/dL — ABNORMAL HIGH (ref 65–99)
Glucose-Capillary: 278 mg/dL — ABNORMAL HIGH (ref 65–99)
Glucose-Capillary: 281 mg/dL — ABNORMAL HIGH (ref 65–99)
Glucose-Capillary: 301 mg/dL — ABNORMAL HIGH (ref 65–99)

## 2014-09-27 LAB — CBC
HCT: 31 % — ABNORMAL LOW (ref 36.0–46.0)
Hemoglobin: 10.5 g/dL — ABNORMAL LOW (ref 12.0–15.0)
MCH: 34.3 pg — AB (ref 26.0–34.0)
MCHC: 33.9 g/dL (ref 30.0–36.0)
MCV: 101.3 fL — ABNORMAL HIGH (ref 78.0–100.0)
Platelets: 34 10*3/uL — ABNORMAL LOW (ref 150–400)
RBC: 3.06 MIL/uL — ABNORMAL LOW (ref 3.87–5.11)
RDW: 14.7 % (ref 11.5–15.5)
WBC: 17.3 10*3/uL — AB (ref 4.0–10.5)

## 2014-09-27 LAB — RENAL FUNCTION PANEL
ALBUMIN: 1.9 g/dL — AB (ref 3.5–5.0)
ALBUMIN: 2 g/dL — AB (ref 3.5–5.0)
ANION GAP: 9 (ref 5–15)
ANION GAP: 9 (ref 5–15)
BUN: 22 mg/dL — ABNORMAL HIGH (ref 6–20)
BUN: 23 mg/dL — ABNORMAL HIGH (ref 6–20)
CHLORIDE: 102 mmol/L (ref 101–111)
CO2: 22 mmol/L (ref 22–32)
CO2: 23 mmol/L (ref 22–32)
Calcium: 7.4 mg/dL — ABNORMAL LOW (ref 8.9–10.3)
Calcium: 7.5 mg/dL — ABNORMAL LOW (ref 8.9–10.3)
Chloride: 100 mmol/L — ABNORMAL LOW (ref 101–111)
Creatinine, Ser: 1.16 mg/dL — ABNORMAL HIGH (ref 0.44–1.00)
Creatinine, Ser: 1.09 mg/dL — ABNORMAL HIGH (ref 0.44–1.00)
GFR calc Af Amer: 54 mL/min — ABNORMAL LOW (ref >60)
GFR calc Af Amer: 58 mL/min — ABNORMAL LOW (ref >60)
GFR calc non Af Amer: 47 mL/min — ABNORMAL LOW (ref >60)
GFR calc non Af Amer: 50 mL/min — ABNORMAL LOW (ref >60)
GLUCOSE: 296 mg/dL — AB (ref 65–99)
Glucose, Bld: 265 mg/dL — ABNORMAL HIGH (ref 65–99)
POTASSIUM: 3.5 mmol/L (ref 3.5–5.1)
POTASSIUM: 3.7 mmol/L (ref 3.5–5.1)
Phosphorus: 1.3 mg/dL — ABNORMAL LOW (ref 2.5–4.6)
Phosphorus: 2.8 mg/dL (ref 2.5–4.6)
Sodium: 133 mmol/L — ABNORMAL LOW (ref 135–145)
Sodium: 132 mmol/L — ABNORMAL LOW (ref 135–145)

## 2014-09-27 LAB — MAGNESIUM: Magnesium: 2.2 mg/dL (ref 1.7–2.4)

## 2014-09-27 LAB — CLOSTRIDIUM DIFFICILE BY PCR: Toxigenic C. Difficile by PCR: NEGATIVE

## 2014-09-27 MED ORDER — INSULIN GLARGINE 100 UNIT/ML ~~LOC~~ SOLN
10.0000 [IU] | Freq: Every day | SUBCUTANEOUS | Status: DC
  Administered 2014-09-27 – 2014-09-28 (×2): 10 [IU] via SUBCUTANEOUS
  Filled 2014-09-27 (×3): qty 0.1

## 2014-09-27 MED ORDER — PIPERACILLIN-TAZOBACTAM IN DEX 2-0.25 GM/50ML IV SOLN
2.2500 g | Freq: Four times a day (QID) | INTRAVENOUS | Status: DC
  Administered 2014-09-27: 2.25 g via INTRAVENOUS
  Filled 2014-09-27 (×4): qty 50

## 2014-09-27 MED ORDER — POTASSIUM PHOSPHATES 15 MMOLE/5ML IV SOLN
30.0000 mmol | Freq: Once | INTRAVENOUS | Status: AC
  Administered 2014-09-27: 30 mmol via INTRAVENOUS
  Filled 2014-09-27: qty 10

## 2014-09-27 MED ORDER — PIPERACILLIN-TAZOBACTAM 3.375 G IVPB
3.3750 g | Freq: Four times a day (QID) | INTRAVENOUS | Status: DC
  Administered 2014-09-27 – 2014-09-28 (×5): 3.375 g via INTRAVENOUS
  Filled 2014-09-27 (×7): qty 50

## 2014-09-27 MED ORDER — INSULIN ASPART 100 UNIT/ML ~~LOC~~ SOLN
0.0000 [IU] | SUBCUTANEOUS | Status: DC
  Administered 2014-09-27: 3 [IU] via SUBCUTANEOUS
  Administered 2014-09-27: 11 [IU] via SUBCUTANEOUS
  Administered 2014-09-27 (×2): 8 [IU] via SUBCUTANEOUS
  Administered 2014-09-27: 5 [IU] via SUBCUTANEOUS
  Administered 2014-09-27: 8 [IU] via SUBCUTANEOUS
  Administered 2014-09-28: 3 [IU] via SUBCUTANEOUS
  Administered 2014-09-28: 5 [IU] via SUBCUTANEOUS
  Administered 2014-09-28: 3 [IU] via SUBCUTANEOUS
  Administered 2014-09-28: 5 [IU] via SUBCUTANEOUS
  Administered 2014-09-28: 2 [IU] via SUBCUTANEOUS
  Administered 2014-09-29: 5 [IU] via SUBCUTANEOUS
  Administered 2014-09-29: 8 [IU] via SUBCUTANEOUS
  Administered 2014-09-29: 3 [IU] via SUBCUTANEOUS

## 2014-09-27 NOTE — Progress Notes (Signed)
eLink Physician-Brief Progress Note Patient Name: KENASIA SCHELLER DOB: 05/18/1945 MRN: [id number]   Date of Service  09/27/2014  HPI/Events of Note  Hyperglycemia on ICU SSI regimen  eICU Interventions  Plan: Change to q4 hour SSI moderate scale Continue to monitor CBG     Intervention Category Intermediate Interventions: Hyperglycemia - evaluation and treatment  Sohaib Vereen 09/27/2014, 12:24 AM

## 2014-09-27 NOTE — Progress Notes (Signed)
Patient ID: [name], female   DOB: February 26, 1946, 69 y.o.   MRN: [id number]  [facility] Progress Note   Assessment/ Plan:   1. Escherichia coli sepsis: On intravenous Zosyn based on sensitivity data, continue efforts at weaning pressors-remains hypotensive. 2.ESRD: Given her pressor dependent hypotension, continue CRRT. The goal is primarily to provide clearance and regulate her metabolic abnormalities. Continue to keep even on CRRT-clinically appears to be euvolemic 3. 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 VDRF: Per critical care service  Subjective:   No acute events overnight-appreciate CCM help with repletion of electrolytes lost with CRRT    Objective:   BP 102/53 mmHg  Pulse 71  Temp(Src) 98.1 F (36.7 C) (Axillary)  Resp 25  Ht 5\' 4"  (1.626 m)  Wt 56.3 kg (124 lb 1.9 oz)  BMI 21.29 kg/m2  SpO2 100%  Physical Exam: Gen: Intubated/sedated CVS: Pulse regular in rate and rhythm, S1 and S2 normal Resp: Clear to auscultation, no rales  Abd: Soft, flat, nontender  Ext: Lower extremities in nonadherent dressings   Labs: BMET  Recent Labs Lab 09/24/14 0423 09/24/14 1550 09/24/14 2235 09/25/14 0415 09/25/14 1634 09/26/14 0400 09/26/14 1600 09/27/14 0457  NA 134* 134*  --  133* 131* 130* 133* 133*  K 4.9 4.4  --  3.9 3.8 3.7 3.5 3.5  CL 99* 100*  --  99* 99* 96* 99* 102  CO2 17* 19*  --  23 16* 19* 24 22  GLUCOSE 75 67  --  150* 181* 272* 262* 265*  BUN 38* 24*  --  13 11 15 19  22*  CREATININE 4.47* 2.76*  --  1.79* 1.31* 1.29* 1.17* 1.16*  CALCIUM 7.8* 7.6*  --  7.9* 7.3* 7.6* 7.8* 7.4*  PHOS 5.1* 3.2 1.9* 1.7* 4.3  4.3 2.6  2.6 1.7*  1.7* 1.3*   CBC  Recent Labs Lab 09/14/2014 1545 09/23/14 0140 09/23/14 0500 09/25/14 0415 09/26/14 0400 09/27/14 0457  WBC 3.2* 3.6* 3.6* 9.3 12.8* 17.3*  NEUTROABS 2.8 3.0  --   --    --   --   HGB 10.5* 10.7* 10.2* 10.1* 10.6* 10.5*  HCT 33.0* 33.9* 32.6* 32.2* 33.0* 31.0*  MCV 108.6* 107.3* 107.9* 106.6* 103.4* 101.3*  PLT 127* 111* 100* 57* 39* 34*   Medications:    . ampicillin-sulbactam (UNASYN) IV  3 g Intravenous Q8H  . antiseptic oral rinse  7 mL Mouth Rinse QID  . chlorhexidine  15 mL Mouth Rinse BID  . darbepoetin (ARANESP) injection - NON-DIALYSIS  40 mcg Subcutaneous Q Mon-1800  . famotidine (PEPCID) IV  20 mg Intravenous Q24H  . feeding supplement (PRO-STAT SUGAR FREE 64)  30 mL Per Tube Daily  . insulin aspart  0-15 Units Subcutaneous 6 times per day  . potassium phosphate IVPB (mmol)  30 mmol Intravenous Once  . sodium chloride  3 mL Intravenous Q12H   Elmarie Shiley, MD 09/27/2014, 7:43 AM

## 2014-09-27 NOTE — Progress Notes (Signed)
Results for KENISE, BARRACO (MRN [id number]) as of 09/27/2014 11:27  Ref. Range 09/26/2014 23:57 09/27/2014 00:11 09/27/2014 04:18 09/27/2014 04:57 09/27/2014 08:34  Glucose-Capillary Latest Ref Range: 65-99 mg/dL  278 (H) 209 (H)  281 (H)  CBGs continuing to be greater than 180 mg/dl.  On Novolog MODERATE correction scale every 4 hours. Recommend adding Novolog 3 units every 4 hours as tube feed coverage. Nay need to have low dose basal insulin if CBGs continue to be elevated. (Patient takes 70/30 insulin 20 units every AM and 10 units every PM at home.) Will continue to follow while in hospital. Harvel Ricks RN BSN CDE

## 2014-09-27 NOTE — Progress Notes (Signed)
[facility] [place] Progress Note Patient Name: [name] DOB: 01/13/1946 MRN: [id number]   Date of Service  09/27/2014  HPI/Events of Note  Moderate hypokalemia and hypophosphatemia  eICU Interventions  Potassium and Phos replaced     Intervention Category Intermediate Interventions: Electrolyte abnormality - evaluation and management  Ara Grandmaison 09/27/2014, 5:43 AM

## 2014-09-27 NOTE — Progress Notes (Signed)
PULMONARY / CRITICAL CARE MEDICINE   Name: [name] MRN: [id number] DOB: 23-May-1945    ADMISSION DATE:  09/07/2014 CONSULTATION DATE:  08/26/2014  REFERRING MD :  Leslye Peer Penn  CHIEF COMPLAINT:  fever  INITIAL PRESENTATION: 69 year old female presented to [facility] 6/30 complaining of fever and abdominal pain x 1 day. Also with LLE rash. Missed dialysis due to malaise and presented to ED. She was found to be hypotensive and bradycardic. Transferred to [facility] for ICU admission.   STUDIES:  abd u/s 7/1>>> cirrhosis, multiple gallstones without clear evidence cholecystitis   SIGNIFICANT EVENTS:  7/2>>worsening resp failure, intubated  SUBJECTIVE:   On CVVH. Continues to need pressors. Has begun to attempt weaning off. Continues to be somnolent on vent. Multiple loose stools yesterday. Placed in enteric and C Diff PCR pending.  VITAL SIGNS: Temp:  [96.7 F (35.9 C)-98.9 F (37.2 C)] 97.8 F (36.6 C) (07/05 1100) Pulse Rate:  [52-73] 66 (07/05 1130) Resp:  [15-33] 29 (07/05 1130) BP: (80-119)/(39-94) 106/78 mmHg (07/05 1130) SpO2:  [100 %] 100 % (07/05 1130) Arterial Line BP: (82-122)/(29-55) 116/47 mmHg (07/05 1100) FiO2 (%):  [40 %] 40 % (07/05 1130) Weight:  [124 lb 1.9 oz (56.3 kg)] 124 lb 1.9 oz (56.3 kg) (07/05 0530) HEMODYNAMICS: CVP:  [4 mmHg-30 mmHg] 11 mmHg VENTILATOR SETTINGS: Vent Mode:  [-] PSV;CPAP FiO2 (%):  [40 %] 40 % Set Rate:  [22 bmp] 22 bmp Vt Set:  [440 mL] 440 mL PEEP:  [5 cmH20] 5 cmH20 Pressure Support:  [8 SMO70-78 cmH20] 8 cmH20 Plateau Pressure:  [22 cmH20-27 cmH20] 22 cmH20 INTAKE / OUTPUT:  Intake/Output Summary (Last 24 hours) at 09/27/14 1209 Last data filed at 09/27/14 1100  Gross per 24 hour  Intake 3057.4 ml  Output   2965 ml  Net   92.4 ml    PHYSICAL EXAMINATION: General -- elderly; NAD; intubated HEENT -- Head is normocephalic. PERRL. Neck -- supple; no JVD; HD port in place Integument -- BLE rash. L > R. Dark bullae  present and dressed Chest -- Intubated; good expansion. Lungs clear to auscultation. Cardiac -- RRR. No murmurs noted.  Abdomen -- soft, nontender. No masses palpable. Bowel sounds present. CNS -- Somnolent, turns head but does not open eyes when name is called, does not follow commands  Extremeties -- No deformity; LE pulses weak.    LABS:  CBC  Recent Labs Lab 09/25/14 0415 09/26/14 0400 09/27/14 0457  WBC 9.3 12.8* 17.3*  HGB 10.1* 10.6* 10.5*  HCT 32.2* 33.0* 31.0*  PLT 57* 39* 34*   Coag's  Recent Labs Lab 09/23/14 0140  APTT 34  INR 1.74*   BMET  Recent Labs Lab 09/26/14 0400 09/26/14 1600 09/27/14 0457  NA 130* 133* 133*  K 3.7 3.5 3.5  CL 96* 99* 102  CO2 19* 24 22  BUN 15 19 22*  CREATININE 1.29* 1.17* 1.16*  GLUCOSE 272* 262* 265*   Electrolytes  Recent Labs Lab 09/26/14 0400 09/26/14 1600 09/27/14 0457  CALCIUM 7.6* 7.8* 7.4*  MG 2.2 2.4 2.2  PHOS 2.6  2.6 1.7*  1.7* 1.3*   Sepsis Markers  Recent Labs Lab 09/23/14 0140 09/24/14 1410 09/26/14 0901  LATICACIDVEN 3.9* 5.2* 2.5*  PROCALCITON 31.16  --   --    ABG  Recent Labs Lab 09/24/14 0029 09/24/14 0235 09/24/14 1643  PHART 7.162* 7.264* 7.359  PCO2ART 64.8* 41.8 37.1  PO2ART 99.0 163* 123.0*   Liver  Enzymes  Recent Labs Lab 09/21/2014 1545 09/23/14 0140  09/26/14 0400 09/26/14 1600 09/27/14 0457  AST 24 31  --   --   --   --   ALT 15 17  --   --   --   --   ALKPHOS 94 92  --   --   --   --   BILITOT 1.5* 2.6*  --   --   --   --   ALBUMIN 3.0* 2.8*  < > 2.2* 2.0* 1.9*  < > = values in this interval not displayed. Cardiac Enzymes  Recent Labs Lab 09/23/14 0140 09/23/14 0500 09/23/14 1030  TROPONINI 0.11* 0.12* 0.17*   Glucose  Recent Labs Lab 09/26/14 0745 09/26/14 1620 09/26/14 2025 09/27/14 0011 09/27/14 0418 09/27/14 0834  GLUCAP 302* 274* 275* 278* 209* 281*    Imaging No results found.   ASSESSMENT / PLAN:  PULMONARY A: Acute  respiratory failure - r/t AMS, sepsis  bilat effusions R>L on CXR 7/3  pulm edema P:   Intubated 7/2 Vent support  - PEEP 5, FiO2 40%  CARDIOVASCULAR CVL 7/1 >>> A:  Septic shock  Bradycardia H/o HTN, CAD SVT - resolved P:  Change to sys goal 90 Neo gtt - wean as able  Vasopressin, wean to off, follow neo requirements CVP 14 Lactate: 2.5, 7/4 (downtrending, will stop following)  RENAL A:   ESRD on HD (TTS) Hyperkalemia  Hypophosphatemia Hyponatremia  P:   CVVHD per renal  Follow BMP Correct electrolytes as needed Neutral balance goals  GASTROINTESTINAL A:   ABD pain, suspect in setting of hypotension Diarrhea: multiple abx course; cannot r/o CDiff P:   NPO IV protonix Enteric Precautions  C Diff PCR pending  HEMATOLOGIC A:   Anemia of chronic illness (Hgb 10.5) Thrombocytopenia (sepsis), unlikely HIT (day 4)  - Platelets 34 (7/5) P:  Follow CBC Dc heparin follow plat trend  INFECTIOUS A:   Septic shock, suspect cellulitic source on bilat LE  - ?necrotizing fasciitis  Leukopenia (resolved) Leukocytosis (12.8 >> 17.3, 7/5) P:   BCx2 7/1 >GNR 2/2>>>EColi (RES cipro, SENS ceftaz, pip/tazo, imipenem) Vancomycin 6/30 >>> 7/3 pip/tazo 6/30 >>> 7/4; Restarting due to increasing WBC Unasyn 7/4 >>> 7/5 Repeat Blood Cx Pending (7/4) Lactate: 2.5, 7/4 (downtrending, will stop following) MRI not appropriate due to patient's wishes (per family) to not pursue surgical intervention.   ENDOCRINE A:   DM (CBGs in 200s) P:   CBG monitoring and SSI  - D5 as needed  NEUROLOGIC A:   Acute metabolic encephalopathy P:   RASS goal: -2 Prn fentanyl  Daily WUA    FAMILY  - Updates: daughter updated 7/3.  Now DNR. Suspect she will continue to deteriorate. Will continue to inform family of our plans when available.    - Inter-disciplinary family meet or Palliative Care meeting due by:  09/30/2014   Elberta Leatherwood, MD,MS,  PGY2 09/27/2014 12:09 PM   PCCM  ATTENDING: I have reviewed pt's initial presentation, consultants notes and hospital database in detail.  The above assessment and plan was formulated under my direction.  In summary: She remains critically ill on mech vent, high dose vasopressors, CRRT with persistently positive blood cultures. Her baseline health status was poor. Her prognosis seems very poor at this point. I spoke in detail with her family. We have agreed to no escalation from our current efforts, no ACLS (already established) and reassess in 48 hrs with possible discont of vent  if not demonstrating substantial improvement   40 minutes of independent CCM time was provided by me   Merton Border, MD;  PCCM service; Mobile [phone]

## 2014-09-27 NOTE — Progress Notes (Signed)
ANTIBIOTIC CONSULT NOTE - FOLLOW UP  Pharmacy Consult for Unasyn >> Zosyn Indication: Persistent E.coli Bacteremia  Allergies  Allergen Reactions  . Lisinopril Swelling  . Ezetimibe Swelling    ZETIA: Tongue swelling    Patient Measurements: Height: 5\' 4"  (162.6 cm) Weight: 124 lb 1.9 oz (56.3 kg) IBW/kg (Calculated) : 54.7  Vital Signs: Temp: 97.8 F (36.6 C) (07/05 1100) Temp Source: Oral (07/05 1500) BP: 118/51 mmHg (07/05 1528) Pulse Rate: 61 (07/05 1528) Intake/Output from previous day: 07/04 0701 - 07/05 0700 In: 2800.1 [I.V.:1385.1; NG/GT:1080; IV Piggyback:335] Out: 2563  Intake/Output from this shift: Total I/O In: 1650.4 [I.V.:530.4; NG/GT:410; IV Piggyback:710] Out: 9024 [Other:1739]  Labs:  Recent Labs  09/25/14 0415  09/26/14 0400 09/26/14 1600 09/27/14 0457  WBC 9.3  --  12.8*  --  17.3*  HGB 10.1*  --  10.6*  --  10.5*  PLT 57*  --  39*  --  34*  CREATININE 1.79*  < > 1.29* 1.17* 1.16*  < > = values in this interval not displayed. Estimated Creatinine Clearance: 39 mL/min (by C-G formula based on Cr of 1.16). No results for input(s): VANCOTROUGH, VANCOPEAK, VANCORANDOM, GENTTROUGH, GENTPEAK, GENTRANDOM, TOBRATROUGH, TOBRAPEAK, TOBRARND, AMIKACINPEAK, AMIKACINTROU, AMIKACIN in the last 72 hours.   Microbiology: Recent Results (from the past 720 hour(s))  Culture, blood (routine x 2)     Status: None   Collection Time: 08/31/2014  3:45 PM  Result Value Ref Range Status   Specimen Description BLOOD LEFT HAND DRAWN BY RN BB  Final   Special Requests BOTTLES DRAWN AEROBIC AND ANAEROBIC 5CC EACH  Final   Culture  Setup Time   Final    GRAM NEGATIVE RODS IN BOTH AEROBIC AND ANAEROBIC BOTTLES CRITICAL RESULT CALLED TO, READ BACK BY AND VERIFIED WITH: Vivianne Spence AT [place] ON [id number] BY Mikel Cella K Performed at [facility]    Culture   Final    ESCHERICHIA COLI SUSCEPTIBILITIES PERFORMED ON PREVIOUS CULTURE WITHIN THE LAST 5 DAYS. Performed at  [facility]    Report Status 09/25/2014 FINAL  Final  Culture, blood (routine x 2)     Status: None   Collection Time: 09/13/2014  4:00 PM  Result Value Ref Range Status   Specimen Description BLOOD LEFT ANTECUBITAL  Final   Special Requests BOTTLES DRAWN AEROBIC AND ANAEROBIC 5CC EACH  Final   Culture  Setup Time   Final    GRAM NEGATIVE RODS IN BOTH AEROBIC AND ANAEROBIC BOTTLES CRITICAL RESULT CALLED TO, READ BACK BY AND VERIFIED WITH: MOORE K AT [facility] AT 0408 ON [id number] BY FORSYTH K    Culture   Final    ESCHERICHIA COLI Performed at [facility]    Report Status 09/25/2014 FINAL  Final   Organism ID, Bacteria ESCHERICHIA COLI  Final      Susceptibility   Escherichia coli - MIC*    AMPICILLIN >=32 RESISTANT Resistant     CEFAZOLIN <=4 SENSITIVE Sensitive     CEFEPIME <=1 SENSITIVE Sensitive     CEFTAZIDIME <=1 SENSITIVE Sensitive     CEFTRIAXONE <=1 SENSITIVE Sensitive     CIPROFLOXACIN >=4 RESISTANT Resistant     GENTAMICIN >=16 RESISTANT Resistant     IMIPENEM <=0.25 SENSITIVE Sensitive     TRIMETH/SULFA >=320 RESISTANT Resistant     AMPICILLIN/SULBACTAM 8 SENSITIVE Sensitive     PIP/TAZO <=4 SENSITIVE Sensitive     * ESCHERICHIA COLI  MRSA PCR Screening  Status: None   Collection Time: 09/02/2014  8:38 PM  Result Value Ref Range Status   MRSA by PCR NEGATIVE NEGATIVE Final    Comment:        The GeneXpert MRSA Assay (FDA approved for NASAL specimens only), is one component of a comprehensive MRSA colonization surveillance program. It is not intended to diagnose MRSA infection nor to guide or monitor treatment for MRSA infections.   Culture, blood (routine x 2)     Status: None   Collection Time: 09/23/14 12:47 AM  Result Value Ref Range Status   Specimen Description BLOOD LEFT HAND  Final   Special Requests IN PEDIATRIC BOTTLE 2CC  Final   Culture  Setup Time   Final    GRAM NEGATIVE RODS AEROBIC BOTTLE ONLY CRITICAL RESULT CALLED TO, READ BACK  BY AND VERIFIED WITH: I BAYNES 09/23/14 @ 41 M VESTAL    Culture   Final    ESCHERICHIA COLI SUSCEPTIBILITIES PERFORMED ON PREVIOUS CULTURE WITHIN THE LAST 5 DAYS.    Report Status 09/25/2014 FINAL  Final  Culture, blood (routine x 2)     Status: None   Collection Time: 09/23/14  1:40 AM  Result Value Ref Range Status   Specimen Description BLOOD CENTRAL LINE  Final   Special Requests BOTTLES DRAWN AEROBIC AND ANAEROBIC 10CC EACH  Final   Culture  Setup Time   Final    GRAM NEGATIVE RODS IN BOTH AEROBIC AND ANAEROBIC BOTTLES CRITICAL RESULT CALLED TO, READ BACK BY AND VERIFIED WITH: A. BAYNES, RN AT 1013 ON [id number] BY S. YARBROUGH    Culture   Final    ESCHERICHIA COLI SUSCEPTIBILITIES PERFORMED ON PREVIOUS CULTURE WITHIN THE LAST 5 DAYS.    Report Status 09/25/2014 FINAL  Final  Culture, blood (routine x 2)     Status: None (Preliminary result)   Collection Time: 09/26/14  1:00 PM  Result Value Ref Range Status   Specimen Description BLOOD LEFT ANTECUBITAL  Final   Special Requests BOTTLES DRAWN AEROBIC ONLY [place]  Final   Culture NO GROWTH 1 DAY  Final   Report Status PENDING  Incomplete  Culture, blood (routine x 2)     Status: None (Preliminary result)   Collection Time: 09/26/14  1:11 PM  Result Value Ref Range Status   Specimen Description BLOOD BLOOD LEFT FOREARM  Final   Special Requests BOTTLES DRAWN AEROBIC ONLY 1CC  Final   Culture  Setup Time   Final    GRAM NEGATIVE RODS AEROBIC BOTTLE ONLY CRITICAL RESULT CALLED TO, READ BACK BY AND VERIFIED WITH: Blackwell RN [id number] 09/27/14 E.GADDY    Culture NO GROWTH 1 DAY  Final   Report Status PENDING  Incomplete  Clostridium Difficile by PCR (not at [facility])     Status: None   Collection Time: 09/26/14 11:57 PM  Result Value Ref Range Status   C difficile by pcr NEGATIVE NEGATIVE Final    Anti-infectives    Start     Dose/Rate Route Frequency Ordered Stop   09/27/14 1900  piperacillin-tazobactam (ZOSYN) IVPB 3.375 g      3.375 g 12.5 mL/hr over 240 Minutes Intravenous Every 6 hours 09/27/14 1550     09/27/14 1300  piperacillin-tazobactam (ZOSYN) IVPB 2.25 g  Status:  Discontinued     2.25 g 100 mL/hr over 30 Minutes Intravenous Every 6 hours 09/27/14 1209 09/27/14 1550   09/26/14 1045  Ampicillin-Sulbactam (UNASYN) 3 g in sodium chloride 0.9 % 100 mL IVPB  Status:  Discontinued     3 g 100 mL/hr over 60 Minutes Intravenous Every 8 hours 09/26/14 1044 09/27/14 1208   09/24/14 0800  vancomycin (VANCOCIN) 500 mg in sodium chloride 0.9 % 100 mL IVPB  Status:  Discontinued     500 mg 100 mL/hr over 60 Minutes Intravenous Every 24 hours 09/23/14 1453 09/25/14 1052   09/23/14 1800  piperacillin-tazobactam (ZOSYN) 2.25 g in dextrose 5 % 50 mL IVPB  Status:  Discontinued     2.25 g 100 mL/hr over 30 Minutes Intravenous 4 times per day 09/23/14 1453 09/26/14 1043   09/01/2014 1900  piperacillin-tazobactam (ZOSYN) 2.25 g in dextrose 5 % 50 mL IVPB  Status:  Discontinued     2.25 g 100 mL/hr over 30 Minutes Intravenous Every 8 hours 09/21/2014 1823 09/23/14 1453   09/11/2014 1830  vancomycin (VANCOCIN) IVPB 1000 mg/200 mL premix     1,000 mg 200 mL/hr over 60 Minutes Intravenous  Once 09/10/2014 1823 09/21/2014 1940      Assessment: 68 YOF who transferred from [facility] after complaints of fever and abdominal pain. The patient was noted to have E.coli bacteremia and antibiotics were narrowed to Unasyn on 7/4. Repeat BCx from 7/5 are again growing GNR, WBC up to 17.3 << 12.8, pharmacy was consulted to broaden back to Zosyn. The patient remains on CRRT and given persistent cultures - will start the higher dose as recommended with CVVHDF  Zosyn 6/30>>7/4; restart 7/5 Vanco 6/30>>7/3 Unasyn 7/4>7/5  6/30 Blood x 4>>Ecoli, resistant to amp, Gent, cipro, septra 7/4 Blood> 1/2 GNR  Goal of Therapy:  Proper antibiotics for infection/cultures adjusted for renal/hepatic function   Plan:  1. Restart Zosyn at a dose of 3.375g IV every  6 hours 2. Will continue to follow renal function, culture results, LOT, and antibiotic de-escalation plans   Alycia Rossetti, PharmD, BCPS Clinical Pharmacist Pager: [phone] 09/27/2014 3:56 PM

## 2014-09-28 ENCOUNTER — Inpatient Hospital Stay (HOSPITAL_COMMUNITY): Payer: Medicare Other

## 2014-09-28 LAB — RENAL FUNCTION PANEL
ALBUMIN: 1.9 g/dL — AB (ref 3.5–5.0)
ANION GAP: 9 (ref 5–15)
Albumin: 1.9 g/dL — ABNORMAL LOW (ref 3.5–5.0)
Anion gap: 10 (ref 5–15)
BUN: 21 mg/dL — ABNORMAL HIGH (ref 6–20)
BUN: 29 mg/dL — AB (ref 6–20)
CALCIUM: 7.4 mg/dL — AB (ref 8.9–10.3)
CHLORIDE: 99 mmol/L — AB (ref 101–111)
CO2: 24 mmol/L (ref 22–32)
CO2: 23 mmol/L (ref 22–32)
CREATININE: 1.41 mg/dL — AB (ref 0.44–1.00)
Calcium: 7.6 mg/dL — ABNORMAL LOW (ref 8.9–10.3)
Chloride: 101 mmol/L (ref 101–111)
Creatinine, Ser: 1.03 mg/dL — ABNORMAL HIGH (ref 0.44–1.00)
GFR calc Af Amer: 43 mL/min — ABNORMAL LOW (ref >60)
GFR, EST NON AFRICAN AMERICAN: 54 mL/min — AB (ref >60)
GFR, EST NON AFRICAN AMERICAN: 37 mL/min — AB (ref >60)
Glucose, Bld: 138 mg/dL — ABNORMAL HIGH (ref 65–99)
Glucose, Bld: 297 mg/dL — ABNORMAL HIGH (ref 65–99)
PHOSPHORUS: 2.1 mg/dL — AB (ref 2.5–4.6)
POTASSIUM: 3.7 mmol/L (ref 3.5–5.1)
Phosphorus: 3.4 mg/dL (ref 2.5–4.6)
Potassium: 3.9 mmol/L (ref 3.5–5.1)
SODIUM: 134 mmol/L — AB (ref 135–145)
Sodium: 132 mmol/L — ABNORMAL LOW (ref 135–145)

## 2014-09-28 LAB — GLUCOSE, CAPILLARY
GLUCOSE-CAPILLARY: 185 mg/dL — AB (ref 65–99)
Glucose-Capillary: 165 mg/dL — ABNORMAL HIGH (ref 65–99)
Glucose-Capillary: 145 mg/dL — ABNORMAL HIGH (ref 65–99)
Glucose-Capillary: 116 mg/dL — ABNORMAL HIGH (ref 65–99)
Glucose-Capillary: 282 mg/dL — ABNORMAL HIGH (ref 65–99)
Glucose-Capillary: 230 mg/dL — ABNORMAL HIGH (ref 65–99)

## 2014-09-28 LAB — HEPATIC FUNCTION PANEL
ALT: 83 U/L — ABNORMAL HIGH (ref 14–54)
AST: 73 U/L — ABNORMAL HIGH (ref 15–41)
Albumin: 1.9 g/dL — ABNORMAL LOW (ref 3.5–5.0)
Alkaline Phosphatase: 223 U/L — ABNORMAL HIGH (ref 38–126)
BILIRUBIN INDIRECT: 3.1 mg/dL — AB (ref 0.3–0.9)
BILIRUBIN TOTAL: 9.1 mg/dL — AB (ref 0.3–1.2)
Bilirubin, Direct: 6 mg/dL — ABNORMAL HIGH (ref 0.1–0.5)
Total Protein: 7 g/dL (ref 6.5–8.1)

## 2014-09-28 LAB — CBC
HCT: 31.5 % — ABNORMAL LOW (ref 36.0–46.0)
HEMOGLOBIN: 10.4 g/dL — AB (ref 12.0–15.0)
MCH: 33.9 pg (ref 26.0–34.0)
MCHC: 33 g/dL (ref 30.0–36.0)
MCV: 102.6 fL — ABNORMAL HIGH (ref 78.0–100.0)
Platelets: 35 10*3/uL — ABNORMAL LOW (ref 150–400)
RBC: 3.07 MIL/uL — AB (ref 3.87–5.11)
RDW: 14.8 % (ref 11.5–15.5)
WBC: 20.5 10*3/uL — ABNORMAL HIGH (ref 4.0–10.5)

## 2014-09-28 LAB — MAGNESIUM: Magnesium: 2.4 mg/dL (ref 1.7–2.4)

## 2014-09-28 MED ORDER — FAMOTIDINE 40 MG/5ML PO SUSR
20.0000 mg | Freq: Every day | ORAL | Status: DC
Start: 2014-09-28 — End: 2014-09-29
  Filled 2014-09-28 (×2): qty 2.5

## 2014-09-28 NOTE — Progress Notes (Signed)
   09/28/14 1600  Clinical Encounter Type  Visited With Patient  Visit Type Initial;Spiritual support;Social support;Critical Care  Referral From Nurse  Spiritual Encounters  Spiritual Needs Emotional  Stress Factors  Family Stress Factors Health changes   Chaplain was paged to patient's room at around 4:30 PM. Chaplain was notified that the patient's family was present and may need emotional/spiritual support. After visit with family, chaplain consulted with patient's nurse. It appears that the family became emotional and alarmed after the nurse turned off the patient's dialysis machine, per physician's orders. Patient's seems to have accepted this as a sign the patient is going to die soon. However, nurse explained that the patient is still on the vent and has been able to breathe on her own, although she is still seriously ill. Chaplain introduced himself to some of the patient's children and the patient's husband. Patient's husband was on the phone with his pastor prior to talking to chaplain. Patient's husband is a very religious man. Patient's husband told the chaplain about the situation and seems accepting if the patient were to pass. Patient's husband no longer wants the patient to suffer, and is comforted knowing she will be closer to God and that he will see her again. Chaplain let patient's husband know that a chaplain is available for support throughout tonight. Patient's family seems ready to let the patient go, however, medical team seemed to receive a strong message from the family, that they wanted the patient to hold on until Friday. Much of the patient's family is currently present and taking turns visiting.  Gar Ponto, Chaplain  5:05 PM

## 2014-09-28 NOTE — Progress Notes (Signed)
Patient ID: [name], female   DOB: Jan 23, 1946, 69 y.o.   MRN: [id number]  [facility] Progress Note   Assessment/ Plan:   1. Escherichia coli sepsis: On intravenous Zosyn based on sensitivity data-continue efforts at weaning pressors (remains on vasopressin/Neo-Synephrine). Unfortunately, very poor/sluggish improvement 2. ESRD: Continue CRRT while maintaining an even fluid balance as she clinically appears to be euvolemic and remains with pressor dependent hypotension. No changes to prescription at this time-electrolyte within acceptable limits. Appreciate help from critical care with replacement of phosphorus. 3. 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 VDRF: Per critical care service  Subjective:   Intermittent diarrhea overnight-Clostridium difficile testing negative. Little more responsive this morning.    Objective:   BP 92/52 mmHg  Pulse 71  Temp(Src) 97.8 F (36.6 C) (Oral)  Resp 24  Ht 5\' 4"  (1.626 m)  Wt 58.3 kg (128 lb 8.5 oz)  BMI 22.05 kg/m2  SpO2 100%  Physical Exam: Gen: Intubated, awake and seemingly responds to questions by nodding CVS: Pulse regular in rate and rhythm, S1 and S2 normal Resp: Clear to auscultation, no rales  Abd: Soft, flat, nontender  Ext: Lower extremities in nonadherent dressings   Labs: BMET  Recent Labs Lab 09/25/14 0415 09/25/14 1634 09/26/14 0400 09/26/14 1600 09/27/14 0457 09/27/14 1644 09/28/14 0533  NA 133* 131* 130* 133* 133* 132* 134*  K 3.9 3.8 3.7 3.5 3.5 3.7 3.7  CL 99* 99* 96* 99* 102 100* 101  CO2 23 16* 19* 24 22 23 24   GLUCOSE 150* 181* 272* 262* 265* 296* 138*  BUN 13 11 15 19  22* 23* 21*  CREATININE 1.79* 1.31* 1.29* 1.17* 1.16* 1.09* 1.03*  CALCIUM 7.9* 7.3* 7.6* 7.8* 7.4* 7.5* 7.6*  PHOS 1.7* 4.3  4.3 2.6  2.6 1.7*  1.7* 1.3* 2.8 2.1*   CBC  Recent Labs Lab  09/07/2014 1545 09/23/14 0140  09/25/14 0415 09/26/14 0400 09/27/14 0457 09/28/14 0533  WBC 3.2* 3.6*  < > 9.3 12.8* 17.3* 20.5*  NEUTROABS 2.8 3.0  --   --   --   --   --   HGB 10.5* 10.7*  < > 10.1* 10.6* 10.5* 10.4*  HCT 33.0* 33.9*  < > 32.2* 33.0* 31.0* 31.5*  MCV 108.6* 107.3*  < > 106.6* 103.4* 101.3* 102.6*  PLT 127* 111*  < > 57* 39* 34* 35*  < > = values in this interval not displayed. Medications:    . antiseptic oral rinse  7 mL Mouth Rinse QID  . chlorhexidine  15 mL Mouth Rinse BID  . darbepoetin (ARANESP) injection - NON-DIALYSIS  40 mcg Subcutaneous Q Mon-1800  . famotidine (PEPCID) IV  20 mg Intravenous Q24H  . feeding supplement (PRO-STAT SUGAR FREE 64)  30 mL Per Tube Daily  . insulin aspart  0-15 Units Subcutaneous 6 times per day  . insulin glargine  10 Units Subcutaneous Daily  . piperacillin-tazobactam (ZOSYN)  IV  3.375 g Intravenous Q6H  . sodium chloride  3 mL Intravenous Q12H   Elmarie Shiley, MD 09/28/2014, 7:18 AM

## 2014-09-28 NOTE — Progress Notes (Signed)
PULMONARY / CRITICAL CARE MEDICINE   Name: [name] MRN: [id number] DOB: 1945/10/22    ADMISSION DATE:  09/08/2014 CONSULTATION DATE:  09/16/2014  REFERRING MD :  Leslye Peer Penn  CHIEF COMPLAINT:  fever  INITIAL PRESENTATION: 69 year old female presented to [facility] 6/30 complaining of fever and abdominal pain x 1 day. Also with LLE rash. Missed dialysis due to malaise and presented to ED. She was found to be hypotensive and bradycardic. Transferred to [facility] for ICU admission.   STUDIES:  abd u/s 7/1>>> cirrhosis, multiple gallstones without clear evidence cholecystitis   SIGNIFICANT EVENTS:  7/2>>worsening resp failure, intubated  SUBJECTIVE:   On CVVH. Continues to need pressors. More awake today. Opens eyes to name. Was somewhat interactive w/ family overnight, per husband.  VITAL SIGNS: Temp:  [97.5 F (36.4 C)-98 F (36.7 C)] 97.8 F (36.6 C) (07/06 0834) Pulse Rate:  [28-146] 74 (07/06 1100) Resp:  [17-34] 23 (07/06 1100) BP: (83-118)/(47-78) 114/59 mmHg (07/06 1100) SpO2:  [100 %] 100 % (07/06 1100) Arterial Line BP: (97-142)/(38-56) 122/51 mmHg (07/06 1100) FiO2 (%):  [30 %-40 %] 30 % (07/06 1100) Weight:  [128 lb 8.5 oz (58.3 kg)] 128 lb 8.5 oz (58.3 kg) (07/06 0500) HEMODYNAMICS: CVP:  [9 mmHg-22 mmHg] 20 mmHg VENTILATOR SETTINGS: Vent Mode:  [-] CPAP;PSV FiO2 (%):  [30 %-40 %] 30 % Set Rate:  [16 bmp] 16 bmp Vt Set:  [440 mL] 440 mL PEEP:  [5 cmH20] 5 cmH20 Pressure Support:  [8 cmH20-10 cmH20] 10 cmH20 Plateau Pressure:  [24 cmH20-26 cmH20] 26 cmH20 INTAKE / OUTPUT:  Intake/Output Summary (Last 24 hours) at 09/28/14 1116 Last data filed at 09/28/14 1100  Gross per 24 hour  Intake 2858.71 ml  Output   2898 ml  Net -39.29 ml    PHYSICAL EXAMINATION: General -- elderly; NAD; intubated; wakes when name is called HEENT -- Head is normocephalic. PERRL. Sclera icteric.  Neck -- supple; no JVD; HD port in place Integument -- BLE rash. L > R. Dark  bullae present and dressed; jaundiced hugh. Chest -- Intubated; good expansion. Lungs clear to auscultation. Cardiac -- RRR. No murmurs noted.  Abdomen -- soft, nontender. No masses palpable. Bowel sounds present. CNS -- Less somnolent than previous days; will open eyes when name is called, does not follow commands  Extremeties -- Rt arm w/ fistula site present; LE pulses weak.    LABS:  CBC  Recent Labs Lab 09/26/14 0400 09/27/14 0457 09/28/14 0533  WBC 12.8* 17.3* 20.5*  HGB 10.6* 10.5* 10.4*  HCT 33.0* 31.0* 31.5*  PLT 39* 34* 35*   Coag's  Recent Labs Lab 09/23/14 0140  APTT 34  INR 1.74*   BMET  Recent Labs Lab 09/27/14 0457 09/27/14 1644 09/28/14 0533  NA 133* 132* 134*  K 3.5 3.7 3.7  CL 102 100* 101  CO2 22 23 24   BUN 22* 23* 21*  CREATININE 1.16* 1.09* 1.03*  GLUCOSE 265* 296* 138*   Electrolytes  Recent Labs Lab 09/26/14 1600 09/27/14 0457 09/27/14 1644 09/28/14 0533  CALCIUM 7.8* 7.4* 7.5* 7.6*  MG 2.4 2.2  --  2.4  PHOS 1.7*  1.7* 1.3* 2.8 2.1*   Sepsis Markers  Recent Labs Lab 09/23/14 0140 09/24/14 1410 09/26/14 0901  LATICACIDVEN 3.9* 5.2* 2.5*  PROCALCITON 31.16  --   --    ABG  Recent Labs Lab 09/24/14 0029 09/24/14 0235 09/24/14 1643  PHART 7.162* 7.264* 7.359  PCO2ART 64.8*  41.8 37.1  PO2ART 99.0 163* 123.0*   Liver Enzymes  Recent Labs Lab 09/02/2014 1545 09/23/14 0140  09/27/14 0457 09/27/14 1644 09/28/14 0533  AST 24 31  --   --   --   --   ALT 15 17  --   --   --   --   ALKPHOS 94 92  --   --   --   --   BILITOT 1.5* 2.6*  --   --   --   --   ALBUMIN 3.0* 2.8*  < > 1.9* 2.0* 1.9*  < > = values in this interval not displayed. Cardiac Enzymes  Recent Labs Lab 09/23/14 0140 09/23/14 0500 09/23/14 1030  TROPONINI 0.11* 0.12* 0.17*   Glucose  Recent Labs Lab 09/27/14 1150 09/27/14 1601 09/27/14 2006 09/27/14 2325 09/28/14 0335 09/28/14 0840  GLUCAP 301* 274* 160* 165* 145* 116*     Imaging No results found.   ASSESSMENT / PLAN:  PULMONARY A: Acute respiratory failure - r/t AMS, sepsis  bilat effusions R>L on CXR 7/3  pulm edema P:   Intubated 7/2 Vent support  - PEEP 5, FiO2 30%  CARDIOVASCULAR CVL 7/1 >>> A:  Septic shock  Bradycardia - resolved H/o HTN, CAD SVT - resolved P:  Change to sys goal 90 Neo gtt - wean as able  Vasopressin, will try to wean as tol; follow neo requirements  RENAL A:   ESRD on HD (TTS) Hyperkalemia  Hypophosphatemia Hyponatremia  P:   CVVHD per renal  Follow BMP Correct electrolytes as needed Neutral balance as overall goal  GASTROINTESTINAL A:   ABD pain, suspect in setting of hypotension Diarrhea: multiple abx course; CDiff neg New onset Jaundice P:   NPO Pepcid per tube Full Abdominal US - to check for source of jaundice Hepatic Panel; check bilirubin  HEMATOLOGIC A:   Anemia of chronic illness (Hgb 10.4) Thrombocytopenia (sepsis), unlikely HIT  - Platelets 35 (7/6) P:  Follow CBC Dc heparin follow platelet trend  INFECTIOUS A:   Septic shock, suspect cellulitic source on bilat LE  - ?necrotizing fasciitis  Leukopenia (resolved) Leukocytosis (12.8 >> 17.3 >> 20.5, 7/6) P:   BCx2 7/1 >GNR 2/2>>>EColi (RES cipro, SENS ceftaz, pip/tazo, imipenem) Vancomycin 6/30 >>> 7/3 pip/tazo 6/30 >>> 7/4; 7/5 >>>  Unasyn 7/4 >>> 7/5 Repeat Blood Cx >> G- Rods MRI not appropriate due to patient's wishes (per family) to not pursue surgical intervention.   ENDOCRINE A:   DM (Improved) P:   CBG monitoring and SSI  - D5 as needed Lantus now at 10u  NEUROLOGIC A:   Acute metabolic encephalopathy P:   RASS goal: -1 Prn fentanyl  Daily WUA    FAMILY  - Updates: family thoroughly updated 7/5.  Now DNR. Suspect she will continue to deteriorate. Will continue to inform family of our plans when available.   - Family has agreed to 48 more hours of aggressive care before possible deescalating  care. (~7/7 or 7/8)  - Inter-disciplinary family meet or Palliative Care meeting due by:  09/30/2014   Elberta Leatherwood, MD,MS,  PGY2 09/28/2014 11:16 AM   PCCM ATTENDING: I have reviewed pt's initial presentation, consultants notes and hospital database in detail.  The above assessment and plan was formulated under my direction.  In summary: Remains on vent but tolerating PSV Continues to require vasopressors Continued on CRRT until machine clotted off LFTs and jaundice worsening The persistence of positive blood cultures suggests a focus  of infection such as an abscess but we have been unable to identify such a focus The prognosis is very poor I updated husband and family today and we will plan to meet in AM 7/07 to entertain the possibility of discontinuation of vent support   45 minutes of independent CCM time was provided by me   Merton Border, MD;  PCCM service; Mobile [phone]

## 2014-09-28 NOTE — Progress Notes (Signed)
Per Dr. Elmarie Shiley stop CRRT when filter clots or times out. Filter clotted at 1550 on 09/28/14. MD notified.

## 2014-09-29 ENCOUNTER — Inpatient Hospital Stay (HOSPITAL_COMMUNITY): Payer: Medicare Other

## 2014-09-29 DIAGNOSIS — M729 Fibroblastic disorder, unspecified: Secondary | ICD-10-CM

## 2014-09-29 LAB — CBC
HEMATOCRIT: 28.5 % — AB (ref 36.0–46.0)
Hemoglobin: 9.6 g/dL — ABNORMAL LOW (ref 12.0–15.0)
MCH: 33.9 pg (ref 26.0–34.0)
MCHC: 33.7 g/dL (ref 30.0–36.0)
MCV: 100.7 fL — ABNORMAL HIGH (ref 78.0–100.0)
Platelets: 60 10*3/uL — ABNORMAL LOW (ref 150–400)
RBC: 2.83 MIL/uL — ABNORMAL LOW (ref 3.87–5.11)
RDW: 14.7 % (ref 11.5–15.5)
WBC: 19.6 10*3/uL — AB (ref 4.0–10.5)

## 2014-09-29 LAB — RENAL FUNCTION PANEL
ALBUMIN: 1.8 g/dL — AB (ref 3.5–5.0)
ANION GAP: 14 (ref 5–15)
BUN: 50 mg/dL — ABNORMAL HIGH (ref 6–20)
CO2: 19 mmol/L — ABNORMAL LOW (ref 22–32)
Calcium: 7.4 mg/dL — ABNORMAL LOW (ref 8.9–10.3)
Chloride: 97 mmol/L — ABNORMAL LOW (ref 101–111)
Creatinine, Ser: 2.2 mg/dL — ABNORMAL HIGH (ref 0.44–1.00)
GFR calc Af Amer: 25 mL/min — ABNORMAL LOW (ref >60)
GFR calc non Af Amer: 21 mL/min — ABNORMAL LOW (ref >60)
Glucose, Bld: 205 mg/dL — ABNORMAL HIGH (ref 65–99)
POTASSIUM: 4.1 mmol/L (ref 3.5–5.1)
Phosphorus: 2.6 mg/dL (ref 2.5–4.6)
Sodium: 130 mmol/L — ABNORMAL LOW (ref 135–145)

## 2014-09-29 LAB — HEPATIC FUNCTION PANEL
ALK PHOS: 327 U/L — AB (ref 38–126)
ALT: 80 U/L — AB (ref 14–54)
AST: 91 U/L — ABNORMAL HIGH (ref 15–41)
Albumin: 1.8 g/dL — ABNORMAL LOW (ref 3.5–5.0)
BILIRUBIN INDIRECT: 2.6 mg/dL — AB (ref 0.3–0.9)
Bilirubin, Direct: 5.3 mg/dL — ABNORMAL HIGH (ref 0.1–0.5)
Total Bilirubin: 7.9 mg/dL — ABNORMAL HIGH (ref 0.3–1.2)
Total Protein: 7 g/dL (ref 6.5–8.1)

## 2014-09-29 LAB — CULTURE, BLOOD (ROUTINE X 2)

## 2014-09-29 LAB — MAGNESIUM: Magnesium: 2.5 mg/dL — ABNORMAL HIGH (ref 1.7–2.4)

## 2014-09-29 LAB — GLUCOSE, CAPILLARY
GLUCOSE-CAPILLARY: 195 mg/dL — AB (ref 65–99)
GLUCOSE-CAPILLARY: 250 mg/dL — AB (ref 65–99)
Glucose-Capillary: 262 mg/dL — ABNORMAL HIGH (ref 65–99)

## 2014-09-29 MED ORDER — PIPERACILLIN-TAZOBACTAM 3.375 G IVPB
3.3750 g | Freq: Three times a day (TID) | INTRAVENOUS | Status: DC
  Administered 2014-09-29: 3.375 g via INTRAVENOUS
  Filled 2014-09-29 (×3): qty 50

## 2014-09-29 MED ORDER — FENTANYL CITRATE (PF) 100 MCG/2ML IJ SOLN
25.0000 ug | Freq: Once | INTRAMUSCULAR | Status: AC
  Administered 2014-09-29: 25 ug via INTRAVENOUS

## 2014-09-29 MED ORDER — MORPHINE BOLUS VIA INFUSION
5.0000 mg | INTRAVENOUS | Status: DC | PRN
  Filled 2014-09-29: qty 20

## 2014-09-29 MED ORDER — MORPHINE SULFATE 25 MG/ML IV SOLN
10.0000 mg/h | INTRAVENOUS | Status: DC
  Administered 2014-09-29: 10 mg/h via INTRAVENOUS
  Filled 2014-09-29: qty 10

## 2014-09-29 MED ORDER — PIPERACILLIN-TAZOBACTAM IN DEX 2-0.25 GM/50ML IV SOLN
2.2500 g | Freq: Three times a day (TID) | INTRAVENOUS | Status: DC
  Filled 2014-09-29 (×2): qty 50

## 2014-09-30 ENCOUNTER — Telehealth: Payer: Self-pay

## 2014-09-30 NOTE — Telephone Encounter (Addendum)
Received faxed certificate for cremation 09-30-14 from [place] [address].  Taken by courier to 2100 for signature from Dr. Alva Garnet.  Not signed by Dr. Alva Garnet.  Picked up from hospital and took to Dr. Halford Chessman for signature.  Received back 10-04-14.  Faxed to [place] and called for pick-up.

## 2014-10-01 LAB — CULTURE, BLOOD (ROUTINE X 2): CULTURE: NO GROWTH

## 2014-10-03 LAB — GLUCOSE, CAPILLARY
GLUCOSE-CAPILLARY: 33 mg/dL — AB (ref 65–99)
Glucose-Capillary: <10 mg/dL — CL (ref 65–99)

## 2014-10-05 ENCOUNTER — Telehealth: Payer: Self-pay

## 2014-10-05 NOTE — Telephone Encounter (Signed)
On 10/05/2014 I received the original death certificate from [facility] The death certificate is for cremation. The patient is a patient of Doctor [facility]. The death certificate will be taken to the pulmonary unit tomorrow am for Doctor Halford Chessman to sign the death certificate. On 2014-10-10 I received the death certificate back from [facility] [place]. I got the death certificate ready for pickup. I called the funeral home to let them know I was placing the death certificate in the mail to the [facility] Department per their request.

## 2014-10-07 NOTE — Discharge Summary (Signed)
PULMONARY / CRITICAL CARE MEDICINE Discharge Summary  Patient name: [name] Medical record number: [id number] Date of birth: 02-18-1946 Age: 69 y.o. Gender: female Date of Admission: 09/04/2014  Date of Discharge: 10-02-14 Admitting Physician: Brand Males, MD  Primary Care Provider: Shirline Frees, MD Consultants: nephrology   Indication for Hospitalization: fever, HoTN, bradycardic  Discharge Diagnoses/Problem List:  Acute resp failure pulm edema Septic shock ESRD Jaundice/hyperbilirubinemia Anemia of chronic dz Thrombocytopenia Leukocytosis DM Acute metabolic encephalopathy  Discharge Condition: Deceased  Brief Hospital Course:  69 year old female presented to [facility] 6/30 complaining of fever and abdominal pain x 1 day. Also with LLE rash. Missed dialysis due to malaise and presented to ED. She was found to be hypotensive and bradycardic. Transferred to [facility] for ICU admission. Patient placed on CVVHD per renal. Intubated on 7/2 for worsening resp function. She tolerated these interventions well, showing some signs of improvement. LE rash worsened into was was believed to be necrotizing fasciitis. Blood cultures and repeat blood cultures grew E coli making nec fasc less likely. Leukocytosis persisted and worsened through broad spec IV abx therapy. Family did not want to pursue further imaging of the LEs due to patient's weakened/frail status. CVVHD clotted the day prior to patient's passing. LFTs and developing jaundice worsened further. Family made the decision for comfort care on 10-02-14; respiratory and cardiac arrest occurred later that day.   Elberta Leatherwood, MD 10/07/2014, 7:20 PM PGY-2, [place]  Merton Border, MD ; [facility] [phone].  After 5:30 PM or weekends, call [phone]

## 2014-10-11 ENCOUNTER — Ambulatory Visit (HOSPITAL_BASED_OUTPATIENT_CLINIC_OR_DEPARTMENT_OTHER): Admission: RE | Admit: 2014-10-11 | Payer: Medicare Other | Source: Ambulatory Visit | Admitting: Orthopedic Surgery

## 2014-10-11 ENCOUNTER — Encounter (HOSPITAL_BASED_OUTPATIENT_CLINIC_OR_DEPARTMENT_OTHER): Admission: RE | Payer: Self-pay | Source: Ambulatory Visit

## 2014-10-11 SURGERY — CARPAL TUNNEL RELEASE
Anesthesia: Regional | Laterality: Left

## 2014-10-14 ENCOUNTER — Ambulatory Visit: Payer: Medicare Other | Admitting: Internal Medicine

## 2014-10-24 NOTE — Progress Notes (Signed)
Nutrition Brief Note  Chart reviewed. Pt now transitioning to comfort care per discussion in ICU rounds.  No further nutrition interventions warranted at this time.  Please re-consult as needed.   Molli Barrows, RD, LDN, [place] Pager [phone] After Hours Pager [phone]

## 2014-10-24 NOTE — Progress Notes (Signed)
Patient extubated with the withdrawal of life sustaining protocol. Family and RN at bedside.

## 2014-10-24 NOTE — Progress Notes (Signed)
Family present at the bedside for extubation.

## 2014-10-24 NOTE — Progress Notes (Signed)
PULMONARY / CRITICAL CARE MEDICINE   Name: [name] MRN: [id number] DOB: 01-06-1946    ADMISSION DATE:  08/30/2014 CONSULTATION DATE:  08/26/2014  REFERRING MD :  Leslye Peer Penn  CHIEF COMPLAINT:  fever  INITIAL PRESENTATION: 69 year old female presented to [facility] 6/30 complaining of fever and abdominal pain x 1 day. Also with LLE rash. Missed dialysis due to malaise and presented to ED. She was found to be hypotensive and bradycardic. Transferred to [facility] for ICU admission.   STUDIES:  abd u/s 7/1>>> cirrhosis, multiple gallstones without clear evidence cholecystitis  abd u/s 7/6>>> stable from previous findings  SIGNIFICANT EVENTS:  - CVVH clotted 7/6; no longer on HD - Meeting w/ family scheduled for today - Most recent Blood Cx: 1 postive for GNR growth; the other, NGTD  SUBJECTIVE:   No longer on CVVH. Continues to need pressors. Family as bedside. Tolerating PSV well.  VITAL SIGNS: Temp:  [97.2 F (36.2 C)-99.7 F (37.6 C)] 99.6 F (37.6 C) (07/07 0847) Pulse Rate:  [31-79] 55 (07/07 0900) Resp:  [14-32] 31 (07/07 0900) BP: (71-132)/(34-78) 105/45 mmHg (07/07 0900) SpO2:  [95 %-100 %] 98 % (07/07 0900) Arterial Line BP: (84-134)/(27-56) 110/41 mmHg (07/07 0900) FiO2 (%):  [30 %] 30 % (07/07 0802) Weight:  [128 lb 12 oz (58.4 kg)] 128 lb 12 oz (58.4 kg) (07/07 0400) HEMODYNAMICS: CVP:  [17 mmHg-21 mmHg] 19 mmHg VENTILATOR SETTINGS: Vent Mode:  [-] PSV;CPAP FiO2 (%):  [30 %] 30 % Set Rate:  [16 bmp] 16 bmp Vt Set:  [440 mL] 440 mL PEEP:  [5 cmH20] 5 cmH20 Pressure Support:  [10 cmH20-14 cmH20] 14 cmH20 Plateau Pressure:  [22 cmH20-25 cmH20] 22 cmH20 INTAKE / OUTPUT:  Intake/Output Summary (Last 24 hours) at Oct 21, 2014 1010 Last data filed at 2014/10/21 0900  Gross per 24 hour  Intake 2232.26 ml  Output    518 ml  Net 1714.26 ml    PHYSICAL EXAMINATION: General -- elderly; NAD; intubated; somnolent  HEENT -- PERRL. Sclera icteric.  Neck -- supple; no  JVD; HD port in place Integument -- BLE dark bullae dressed; jaundiced Chest -- Intubated; Lungs clear to auscultation. Cardiac -- RRR. No murmurs noted.  Abdomen -- soft, not distended . No masses palpable. CNS -- will open eyes when name is called, does not follow commands  Extremeties -- Rt arm w/ fistula site present; LE pulses weak.    LABS:  CBC  Recent Labs Lab 09/27/14 0457 09/28/14 0533 Oct 21, 2014 0400  WBC 17.3* 20.5* 19.6*  HGB 10.5* 10.4* 9.6*  HCT 31.0* 31.5* 28.5*  PLT 34* 35* 60*   Coag's  Recent Labs Lab 09/23/14 0140  APTT 34  INR 1.74*   BMET  Recent Labs Lab 09/28/14 0533 09/28/14 1633 October 21, 2014 0400  NA 134* 132* 130*  K 3.7 3.9 4.1  CL 101 99* 97*  CO2 24 23 19*  BUN 21* 29* 50*  CREATININE 1.03* 1.41* 2.20*  GLUCOSE 138* 297* 205*   Electrolytes  Recent Labs Lab 09/27/14 0457  09/28/14 0533 09/28/14 1633 October 21, 2014 0400  CALCIUM 7.4*  < > 7.6* 7.4* 7.4*  MG 2.2  --  2.4  --  2.5*  PHOS 1.3*  < > 2.1* 3.4 2.6  < > = values in this interval not displayed. Sepsis Markers  Recent Labs Lab 09/23/14 0140 09/24/14 1410 09/26/14 0901  LATICACIDVEN 3.9* 5.2* 2.5*  PROCALCITON 31.16  --   --  ABG  Recent Labs Lab 09/24/14 0029 09/24/14 0235 09/24/14 1643  PHART 7.162* 7.264* 7.359  PCO2ART 64.8* 41.8 37.1  PO2ART 99.0 163* 123.0*   Liver Enzymes  Recent Labs Lab 09/23/14 0140  09/28/14 1212 09/28/14 1633 10/11/14 0400  AST 31  --  73*  --  91*  ALT 17  --  83*  --  80*  ALKPHOS 92  --  223*  --  327*  BILITOT 2.6*  --  9.1*  --  7.9*  ALBUMIN 2.8*  < > 1.9* 1.9* 1.8*  1.8*  < > = values in this interval not displayed. Cardiac Enzymes  Recent Labs Lab 09/23/14 0140 09/23/14 0500 09/23/14 1030  TROPONINI 0.11* 0.12* 0.17*   Glucose  Recent Labs Lab 09/28/14 1130 09/28/14 1624 09/28/14 2033 10/11/2014 0029 10-11-14 0348 2014/10/11 0845  GLUCAP 185* 282* 230* 262* 195* 250*    Imaging US Abdomen  Complete  09/28/2014   CLINICAL DATA:  Elevated liver function tests, E coli bacteremia, jaundice  EXAM: ULTRASOUND ABDOMEN COMPLETE  COMPARISON:  09/23/2014 abdominal ultrasound  FINDINGS: Gallbladder: Dependent stones/ sludge reidentified with gallbladder wall thickness 3 mm at upper limits of normal allowing for contraction. No pericholecystic fluid. Sonographic Murphy's sign cannot be assessed, patient unresponsive.  Common bile duct: Diameter: 3 mm  Liver: Lobulated contour with inhomogeneous echotexture which may indicate cirrhosis or other infiltrative processes. No new focal abnormality or ductal dilatation.  IVC: No abnormality visualized.  Pancreas: Suboptimally visualized due to overlying bowel gas.  Spleen: Size and appearance within normal limits.  Right Kidney: Length: 10.0 cm. Increased renal cortical echogenicity without hydronephrosis or focal mass. Renal pyramid increased echogenicity could represent nonobstructing calculi but is not well visualized.  Left Kidney: Length: Not visualized. No mass in the left renal fossa.  Abdominal aorta: No aneurysm visualized.  Other findings: Moderate right pleural effusion.  IMPRESSION: Stable findings as above without focal abnormality to explain the provided clinical history.  Right renal cortical increased echogenicity suggesting medical renal disease.   Electronically Signed   By: Conchita Paris M.D.   On: 09/28/2014 19:55   Dg Chest Port 1 View  10/11/2014   CLINICAL DATA:  Respiratory failure.  Shortness of breath.  EXAM: PORTABLE CHEST - 1 VIEW  COMPARISON:  09/25/2014 .  FINDINGS: Endotracheal tube, NG tube, right IJ line in stable position. Cardiomegaly. Prior cardiac valve replacement. Mild basilar atelectasis. Small right pleural effusion again noted.  IMPRESSION: 1. Lines and tubes in stable position. 2. Cardiac valve replacement.  Stable cardiomegaly. 3. Mild basilar atelectasis with mild right lower lobe infiltrate and small right pleural  effusion again noted without significant change.   Electronically Signed   By: Marcello Moores  Register   On: 10/11/14 07:26     ASSESSMENT / PLAN:  PULMONARY A: Acute respiratory failure - r/t AMS, sepsis  bilat effusions; mild RLL infiltrate on CXR 7/7 pulm edema P:   Intubated 7/2>>> Pressure support  - PEEP 5, FiO2 30%  CARDIOVASCULAR CVL 7/1 >>> A:  Septic shock  Bradycardia - resolved H/o HTN, CAD SVT - resolved P:  systolic goal 90 Neo gtt - wean as able  Vasopressin, will try to wean as tol; follow neo requirements  RENAL A:   ESRD on HD (TTS) Hyperkalemia  Hypophosphatemia Hyponatremia  P:   CVVHD clotted >> DCd Consider gentle diuresis (yesterday +1592m fluid balance) Follow BMP Correct electrolytes as needed  GASTROINTESTINAL A:   Jaundice/hyperbilirubinemia: repeat Abd [place]7/6  unchanged from prior P:   NPO Pepcid per tube Hepatic Panel; t-bili 7.9  HEMATOLOGIC A:   Anemia of chronic illness (Hgb 9.6) Thrombocytopenia (sepsis), unlikely HIT  - Platelets 60 (7/7)>>improving P:  Follow CBC DCd heparin follow platelet trend  INFECTIOUS A:   Septic shock, suspect cellulitic source on bilat LE  - ?necrotizing fasciitis  Leukocytosis (20.5 >> 19.6, 7/7) P:   BCx2 7/1 >GNR 2/2>>>EColi (RES cipro, SENS ceftaz, pip/tazo, imipenem) Vancomycin 6/30 >>> 7/3 pip/tazo 6/30 >>> 7/4; 7/5 >>>  Unasyn 7/4 >>> 7/5 Repeat Blood Cx >> G- Rods (speciation pending) MRI not appropriate due to patient's wishes (per family) to not pursue surgical intervention.   ENDOCRINE A:   DM (Improved) P:   CBG monitoring and SSI  - D5 as needed Lantus 10u  NEUROLOGIC A:   Acute metabolic encephalopathy P:   RASS goal: -1 Prn fentanyl  Daily WUA    FAMILY  - Updates: Now DNR.    - Meeting w/ family scheduled for later today; possible deescalating care.  - Inter-disciplinary family meet or Palliative Care meeting due by:  09/30/2014   Elberta Leatherwood, MD,MS,   PGY2 Oct 21, 2014 10:10 AM   PCCM ATTENDING: I have reviewed pt's initial presentation, consultants notes and hospital database in detail.  The above assessment and plan was formulated under my direction.  In summary: We are a week into this critical illness with no evidence of resolution. The wounds on her LLE are now gangrenous and would likely require amputation. She has had 3 sets of blood cultures positive for E coli despite appropriate abx coverage which indicates that she cannot recover from this infection without removal of the focus (which is now clearly determined to be her LE cellulitis/faciitis). I have met with family and shared this information in detail. They are all in agreement that she would not wish to undergo amputation. We are all in agreement that the proper course now is to proceed with full comfort and extubation. Withdrawal protocol has been ordered   35 minutes of independent CCM time was provided by me   Merton Border, MD;  PCCM service; [place] [phone]

## 2014-10-24 NOTE — Progress Notes (Signed)
ANTIBIOTIC CONSULT NOTE  Pharmacy Consult for Unasyn >> Zosyn Indication: Persistent E.coli Bacteremia  Allergies  Allergen Reactions  . Lisinopril Swelling  . Ezetimibe Swelling    ZETIA: Tongue swelling    Patient Measurements: Height: 5\' 4"  (162.6 cm) Weight: 128 lb 12 oz (58.4 kg) IBW/kg (Calculated) : 54.7  Vital Signs: Temp: 99.6 F (37.6 C) (07/07 0847) Temp Source: Axillary (07/07 0847) BP: 105/45 mmHg (07/07 0900) Pulse Rate: 55 (07/07 0900) Intake/Output from previous day: 07/06 0701 - 07/07 0700 In: 2326.1 [I.V.:1043.6; NG/GT:1082.5; IV Piggyback:200] Out: 802  Intake/Output from this shift: Total I/O In: 172.6 [I.V.:82.6; NG/GT:90] Out: -   Labs:  Recent Labs  09/27/14 0457  09/28/14 0533 09/28/14 1633 24-Oct-2014 0400  WBC 17.3*  --  20.5*  --  19.6*  HGB 10.5*  --  10.4*  --  9.6*  PLT 34*  --  35*  --  60*  CREATININE 1.16*  < > 1.03* 1.41* 2.20*  < > = values in this interval not displayed. Estimated Creatinine Clearance: 20.5 mL/min (by C-G formula based on Cr of 2.2). No results for input(s): VANCOTROUGH, VANCOPEAK, VANCORANDOM, GENTTROUGH, GENTPEAK, GENTRANDOM, TOBRATROUGH, TOBRAPEAK, TOBRARND, AMIKACINPEAK, AMIKACINTROU, AMIKACIN in the last 72 hours.    Anti-infectives    Start     Dose/Rate Route Frequency Ordered Stop   2014/10/24 1015  piperacillin-tazobactam (ZOSYN) IVPB 2.25 g     2.25 g 100 mL/hr over 30 Minutes Intravenous 3 times per day 10-24-2014 1002     2014-10-24 0200  piperacillin-tazobactam (ZOSYN) IVPB 3.375 g  Status:  Discontinued     3.375 g 12.5 mL/hr over 240 Minutes Intravenous Every 8 hours Oct 24, 2014 0047 October 24, 2014 1002   09/27/14 1900  piperacillin-tazobactam (ZOSYN) IVPB 3.375 g  Status:  Discontinued     3.375 g 12.5 mL/hr over 240 Minutes Intravenous Every 6 hours 09/27/14 1550 10-24-2014 0047   09/27/14 1300  piperacillin-tazobactam (ZOSYN) IVPB 2.25 g  Status:  Discontinued     2.25 g 100 mL/hr over 30 Minutes  Intravenous Every 6 hours 09/27/14 1209 09/27/14 1550   09/26/14 1045  Ampicillin-Sulbactam (UNASYN) 3 g in sodium chloride 0.9 % 100 mL IVPB  Status:  Discontinued     3 g 100 mL/hr over 60 Minutes Intravenous Every 8 hours 09/26/14 1044 09/27/14 1208   09/24/14 0800  vancomycin (VANCOCIN) 500 mg in sodium chloride 0.9 % 100 mL IVPB  Status:  Discontinued     500 mg 100 mL/hr over 60 Minutes Intravenous Every 24 hours 09/23/14 1453 09/25/14 1052   09/23/14 1800  piperacillin-tazobactam (ZOSYN) 2.25 g in dextrose 5 % 50 mL IVPB  Status:  Discontinued     2.25 g 100 mL/hr over 30 Minutes Intravenous 4 times per day 09/23/14 1453 09/26/14 1043   09/13/2014 1900  piperacillin-tazobactam (ZOSYN) 2.25 g in dextrose 5 % 50 mL IVPB  Status:  Discontinued     2.25 g 100 mL/hr over 30 Minutes Intravenous Every 8 hours 08/28/2014 1823 09/23/14 1453   09/21/2014 1830  vancomycin (VANCOCIN) IVPB 1000 mg/200 mL premix     1,000 mg 200 mL/hr over 60 Minutes Intravenous  Once 08/27/2014 1823 09/20/2014 1940      Assessment: 68 YOF who transferred from [facility] after complaints of fever and abdominal pain. The patient was noted to have E.coli bacteremia and antibiotics were narrowed to Unasyn on 7/4. Repeat BCx showed 1/2 with e.coli.  Pt is off CRRT.  Zosyn 6/30>>7/4; restart 7/5 > Vanco  6/30>>7/3 Unasyn 7/4>7/5  6/30 Blood x 4>>Ecoli, resistant to amp, Gent, cipro, septra 7/4 Blood> 1/2 Ecoli, same R pattern  7/4 cdiff: neg  Goal of Therapy:  Proper antibiotics for infection/cultures adjusted for renal/hepatic function   Plan:  1. Reduce dose of Zosyn 2.25 g IV q8h  2. F/u renal plans, plans for iHD and GOC     Hughes Better, PharmD, BCPS Clinical Pharmacist Pager: [phone] 10-10-2014 10:07 AM

## 2014-10-24 NOTE — Progress Notes (Signed)
MD notified time of death 68. Verified by 2 RNs at bedside, family at bedside, [place] donor called.

## 2014-10-24 NOTE — Progress Notes (Signed)
ANTIBIOTIC CONSULT   Pt now off CRRT.  Will change Zosyn to Q8H extended infusion for CrCl ~30; this will likely change off CRRT so will follow carefully.  Wynona Neat, PharmD, BCPS 10/24/2014 12:51 AM

## 2014-10-24 NOTE — Progress Notes (Signed)
Patient ID: Margart Sickles, female   DOB: 05/11/1945, 69 y.o.   MRN: [id number]  [facility] Progress Note   Assessment/ Plan:   1. Escherichia coli sepsis: On intravenous Zosyn based on sensitivity data- efforts at weaning pressors limited (remains on vasopressin/Neo-Synephrine). Plans noted for possible withdrawal of care at some point in the next 24-48hrs 2. ESRD: CRRT stopped yesterday after filter clotted- no acute needs noted at this time- overall with poor prognosis. 3. 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 VDRF: Per critical care service  Subjective:   Discussions with family from yesterday noted with plans not to escalate care and likely withdraw soon.    Objective:   BP 95/41 mmHg  Pulse 54  Temp(Src) 98.8 F (37.1 C) (Oral)  Resp 22  Ht 5\' 4"  (1.626 m)  Wt 58.4 kg (128 lb 12 oz)  BMI 22.09 kg/m2  SpO2 99%  Physical Exam: Gen: Intubated, sedated CVS: Pulse regular in rate and rhythm, S1 and S2 normal Resp: Clear to auscultation, no rales  Abd: Soft, flat, nontender  Ext: Lower extremities in nonadherent dressings   Labs: BMET  Recent Labs Lab 09/26/14 0400 09/26/14 1600 09/27/14 0457 09/27/14 1644 09/28/14 0533 09/28/14 1633 2014-10-28 0400  NA 130* 133* 133* 132* 134* 132* 130*  K 3.7 3.5 3.5 3.7 3.7 3.9 4.1  CL 96* 99* 102 100* 101 99* 97*  CO2 19* 24 22 23 24 23  19*  GLUCOSE 272* 262* 265* 296* 138* 297* 205*  BUN 15 19 22* 23* 21* 29* 50*  CREATININE 1.29* 1.17* 1.16* 1.09* 1.03* 1.41* 2.20*  CALCIUM 7.6* 7.8* 7.4* 7.5* 7.6* 7.4* 7.4*  PHOS 2.6  2.6 1.7*  1.7* 1.3* 2.8 2.1* 3.4 2.6   CBC  Recent Labs Lab 09/12/2014 1545 09/23/14 0140  09/26/14 0400 09/27/14 0457 09/28/14 0533 October 28, 2014 0400  WBC 3.2* 3.6*  < > 12.8* 17.3* 20.5* 19.6*  NEUTROABS 2.8 3.0  --   --   --   --   --   HGB 10.5* 10.7*  < > 10.6*  10.5* 10.4* 9.6*  HCT 33.0* 33.9*  < > 33.0* 31.0* 31.5* 28.5*  MCV 108.6* 107.3*  < > 103.4* 101.3* 102.6* 100.7*  PLT 127* 111*  < > 39* 34* 35* 60*  < > = values in this interval not displayed. Medications:    . antiseptic oral rinse  7 mL Mouth Rinse QID  . chlorhexidine  15 mL Mouth Rinse BID  . darbepoetin (ARANESP) injection - NON-DIALYSIS  40 mcg Subcutaneous Q Mon-1800  . famotidine  20 mg Per Tube Daily  . feeding supplement (PRO-STAT SUGAR FREE 64)  30 mL Per Tube Daily  . insulin aspart  0-15 Units Subcutaneous 6 times per day  . insulin glargine  10 Units Subcutaneous Daily  . piperacillin-tazobactam (ZOSYN)  IV  3.375 g Intravenous Q8H  . sodium chloride  3 mL Intravenous Q12H   Elmarie Shiley, MD 2014/10/28, 7:38 AM

## 2014-10-24 NOTE — Progress Notes (Signed)
   Oct 08, 2014 1100  Clinical Encounter Type  Visited With Family;Health care provider  Visit Type Follow-up;Spiritual support;Social support;Critical Care;Patient actively dying  Referral From Physician  Spiritual Encounters  Spiritual Needs Emotional;Grief support  Stress Factors  Family Stress Factors Health changes;Loss;Major life changes   Chaplain was referred to patient via spiritual care consult. Chaplain followed up with patient's husband this morning. Patient's husband talked about the patient transitioning to comfort care. Patient's husband said the whole family is having a difficult time but that they do feel comforted knowing this was the right choice. Patient's husband is feeling well-supported by his faith during this time. Patient's husband was being visited by a life long friend who was providing further support for the patient's family during this time. Patient's husband is wells supported at this time and was adamant about communicating the excellence of care his wife has received during this difficult time. Chaplain let patient know that continued support is available today.  Page Elodia Florence chaplain if further support needs arise.  Gar Ponto, Chaplain  11:51 AM

## 2014-10-24 NOTE — Progress Notes (Signed)
Morphine 225 ML wasted with 2 RNs. Haig Prophet RN and Tiney Rouge RN.

## 2014-10-24 DEATH — deceased

## 2015-02-02 ENCOUNTER — Telehealth (HOSPITAL_COMMUNITY): Payer: Self-pay

## 2015-04-03 IMAGING — CR DG CHEST 2V
2 series · 2 of 2 positions shown · non-contrast
Comparison: December 07, 2013

CLINICAL DATA: Cough and congestion; difficulty breathing

EXAM:
CHEST  2 VIEW

[view not recorded (1 of 2)]
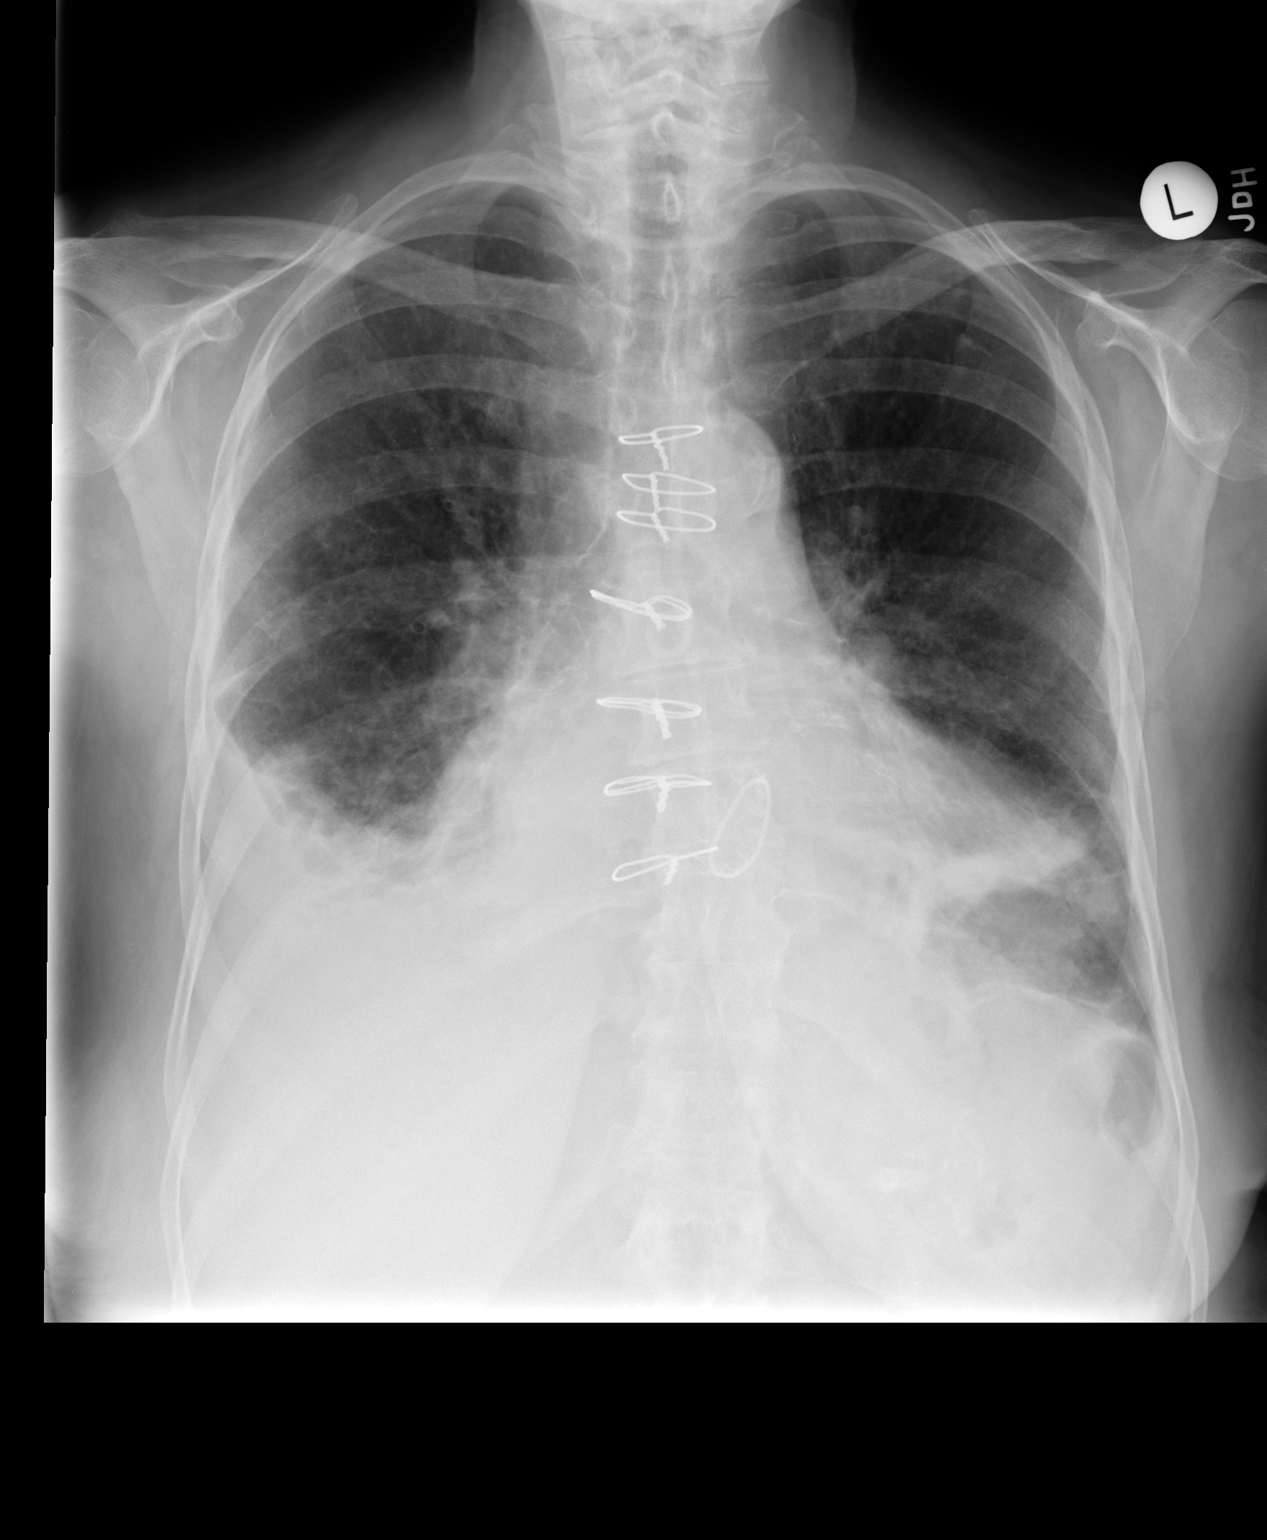

[view not recorded (2 of 2)]
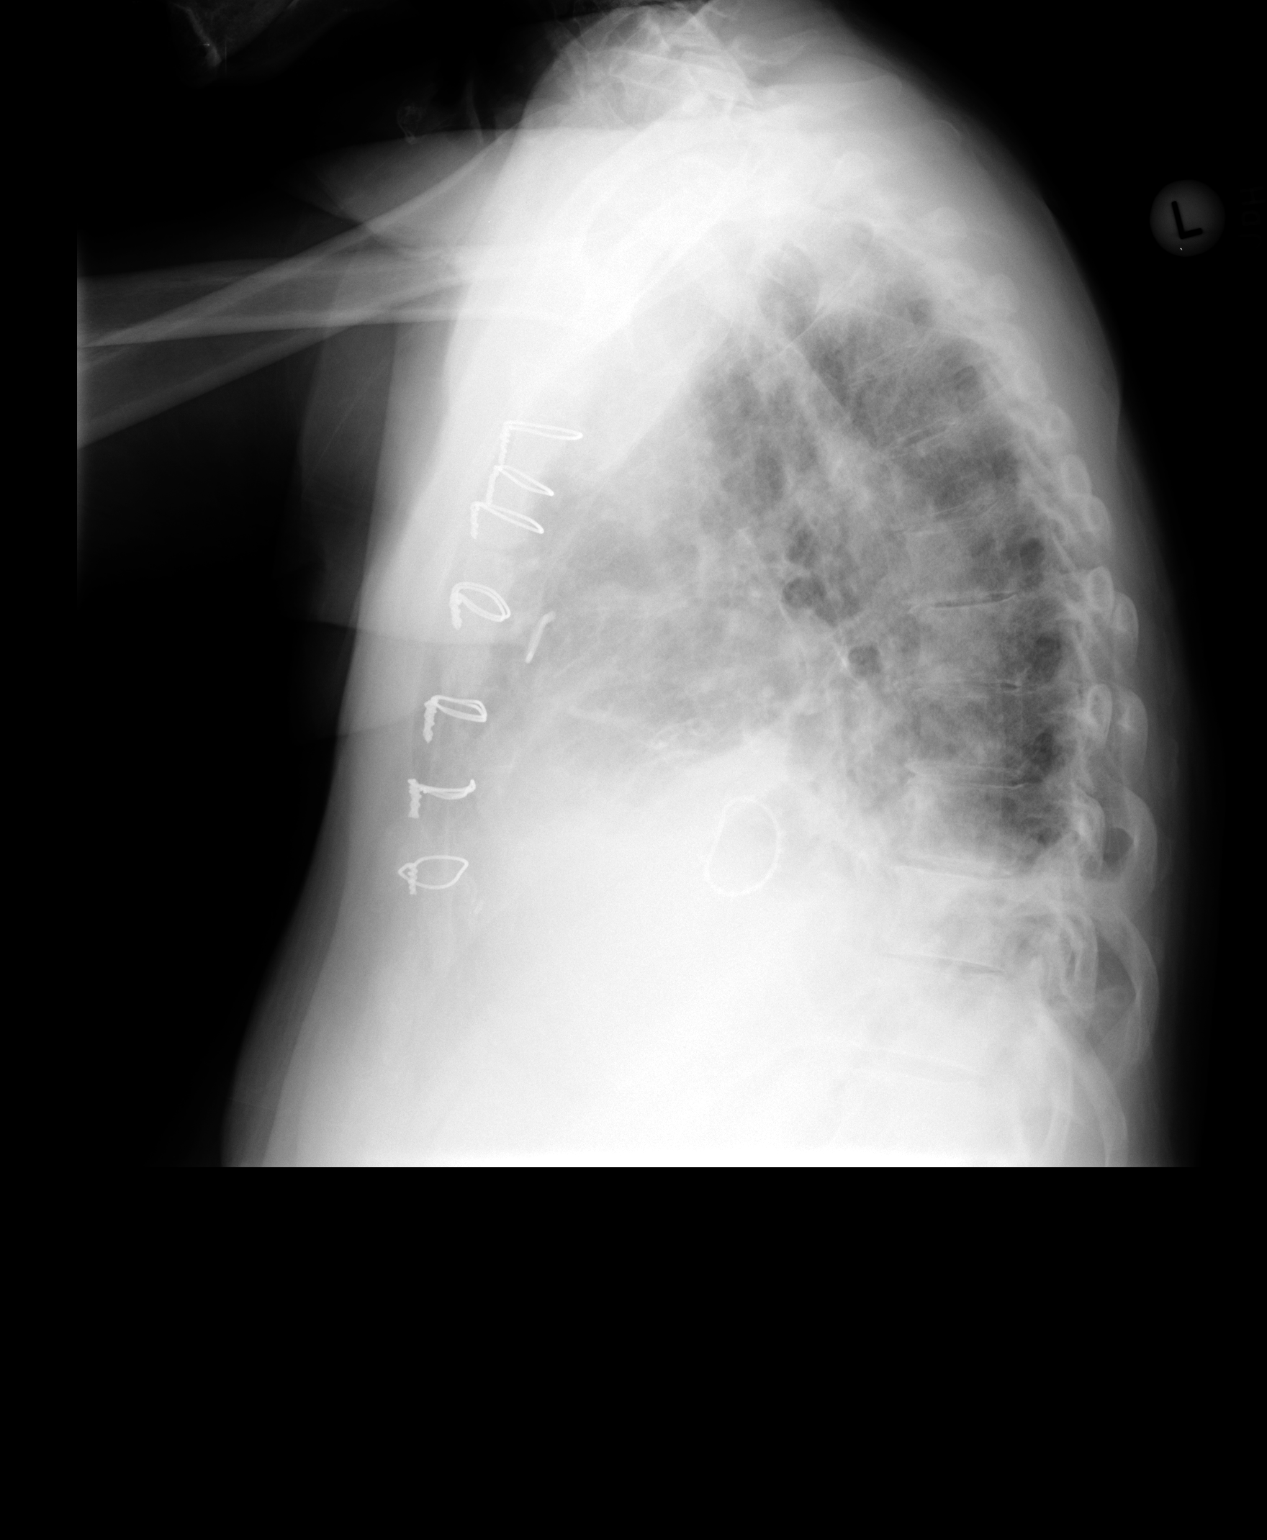

[2 of 2 positions shown; findings below may reference images not displayed]

FINDINGS: There is somewhat less interstitial edema compared to recent prior
study. There is patchy consolidation in both lung bases with
loculated effusion at the right base, stable. Heart is enlarged.
Pulmonary vascularity is normal. Patient is status post mitral valve
replacement. Patient is also status post coronary artery bypass
grafting. No adenopathy. There is degenerative change in the
thoracic spine.
IMPRESSION: Less interstitial edema compared to recent prior study. Persistent
bibasilar consolidation with loculated effusion right base. Stable
cardiomegaly. No new opacity.

## 2015-04-24 IMAGING — CR DG CHEST 2V
1 series · 2 of 2 positions shown · non-contrast
Comparison: 12/28/2013

CLINICAL DATA: Preop. Dialysis patient with history of hypertension
and diabetes.

EXAM:
CHEST  2 VIEW

[Series 1: dxr chest pa (or ap) and lateral · 0.14mm/px · 2 of 2 slices shown]
[im 1/2]
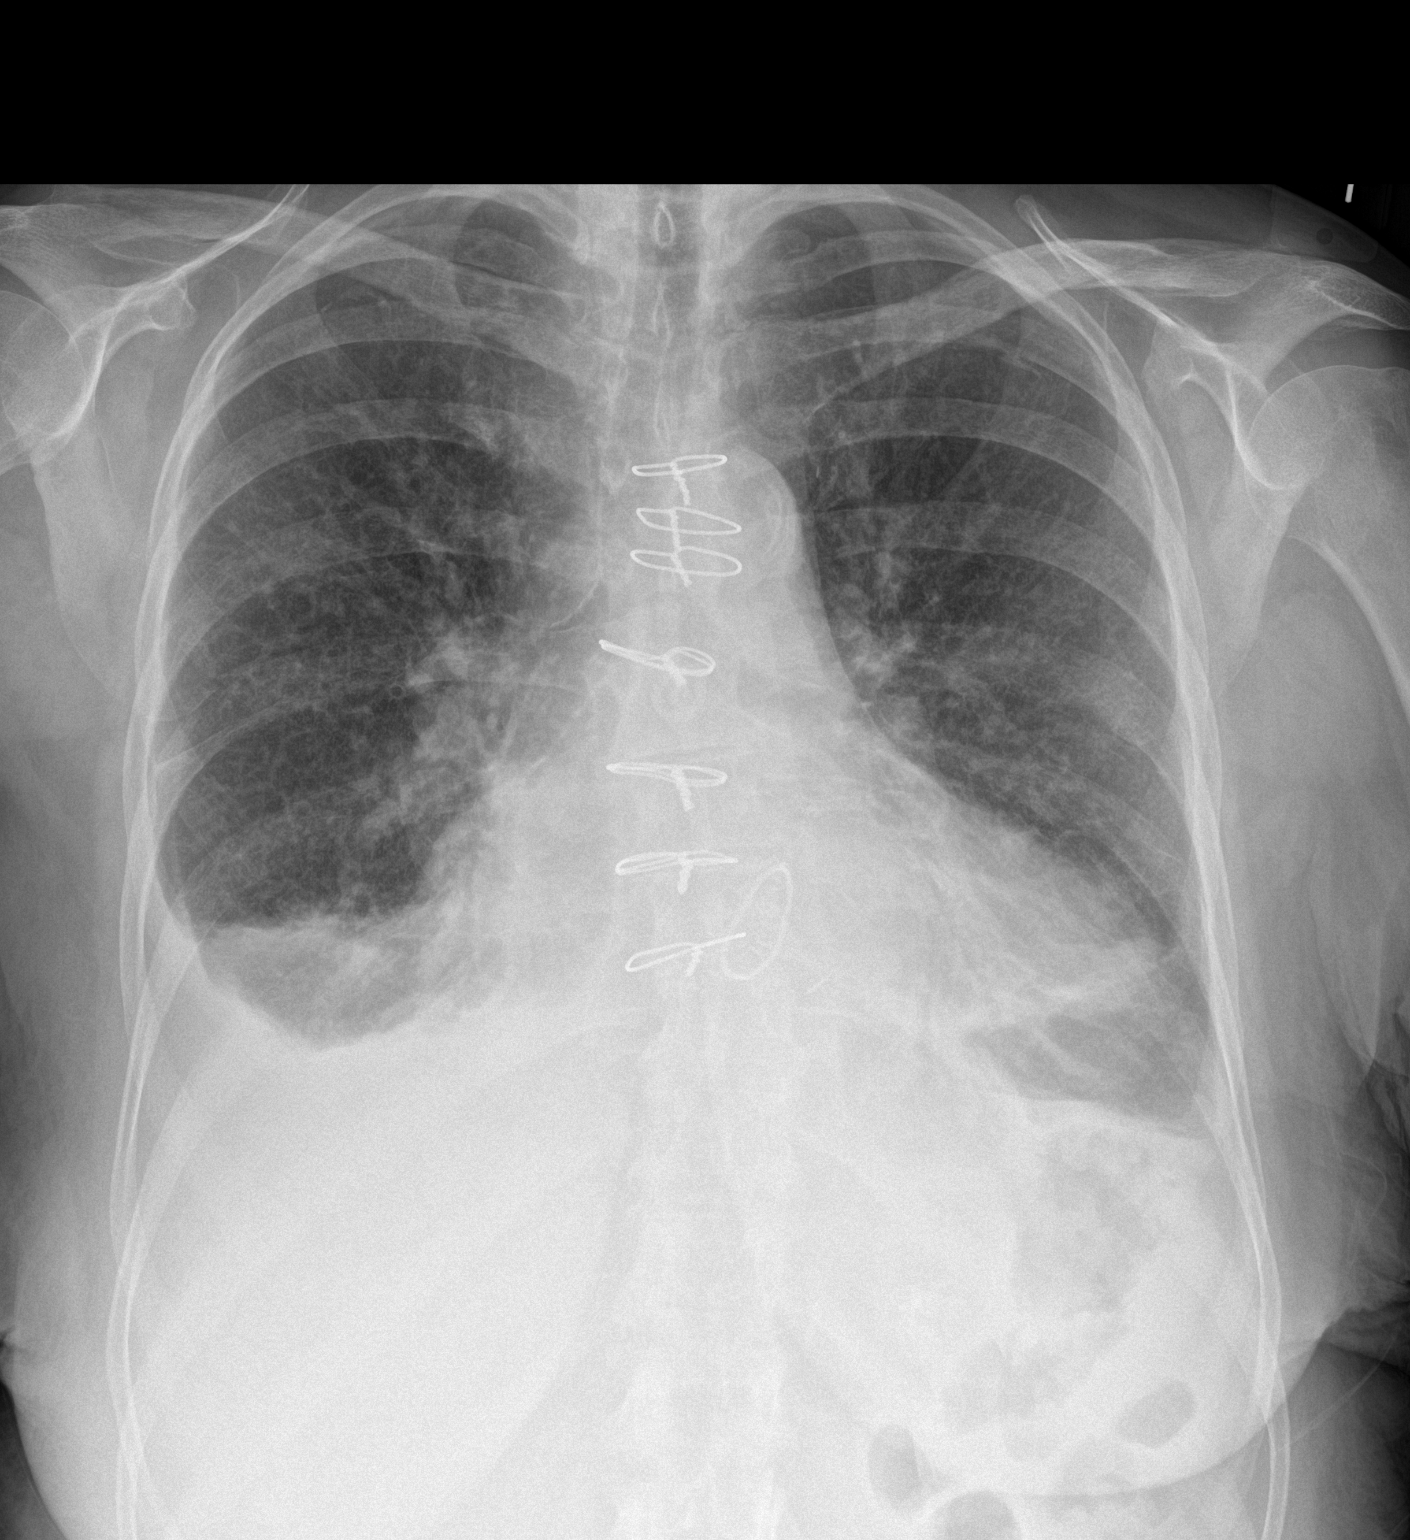
[im 2/2]
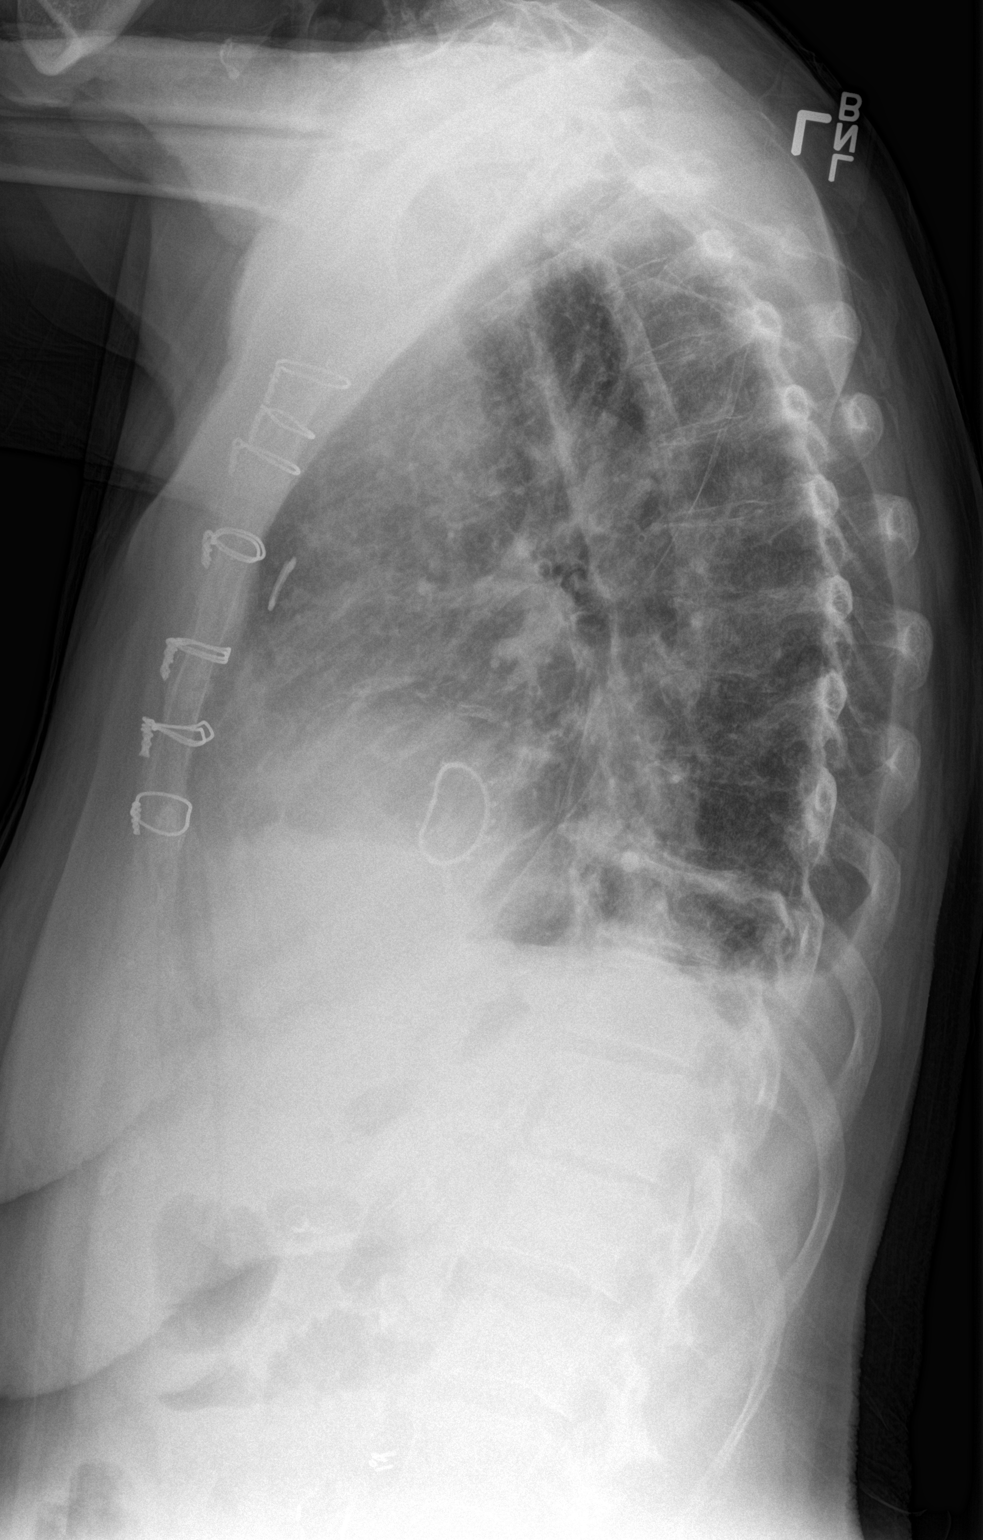

[2 of 2 positions shown; findings below may reference images not displayed]

FINDINGS: Changes from cardiac surgery and mitral valve replacement are
stable. Cardiac silhouette is mildly enlarged. No mediastinal or
hilar masses or convincing adenopathy.

Bibasilar lung opacity is noted consistent with small effusions and
associated atelectasis. This is similar to the prior study allowing
for differences in technique and patient positioning. Mild
interstitial prominence but no overt pulmonary edema. No
pneumothorax.

Bony thorax is demineralized but intact.
IMPRESSION: 1. No acute findings.
2. Small chronic pleural effusions with associated lung base
atelectasis.
3. Stable changes from prior cardiac surgery.

## 2015-06-10 IMAGING — US US ABDOMEN COMPLETE W/ ELASTOGRAPHY
1 series · 13 of 25 positions shown · non-contrast
Comparison: CT abdomen pelvis dated 10/24/2013.

CLINICAL DATA: Fatty liver



[Series 1: us abdomen complete w/ elastography · 0.13mm/px · 13 of 29 slices shown]
[im 1/29]
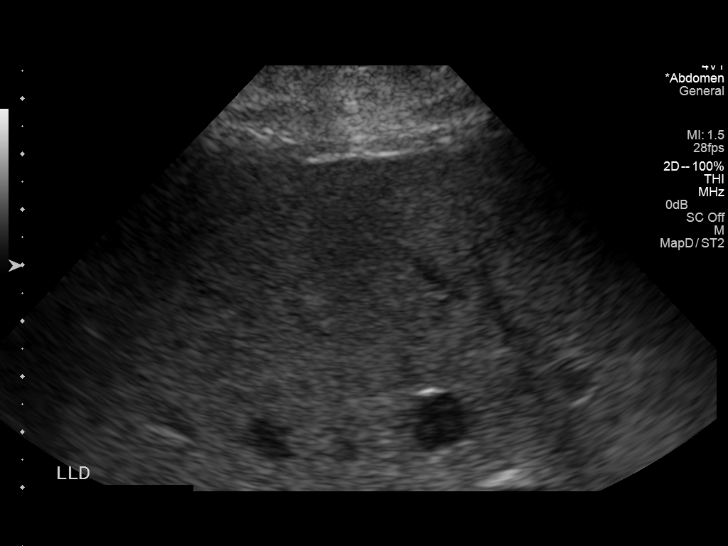
[im 3/29]
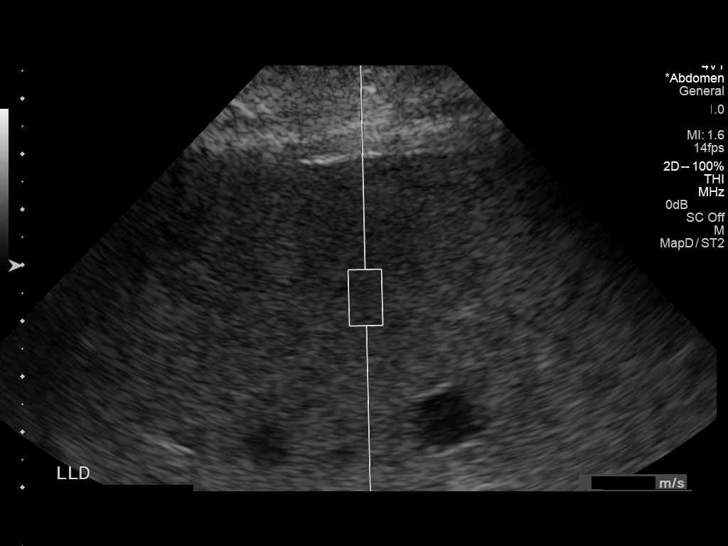
[im 5/29]
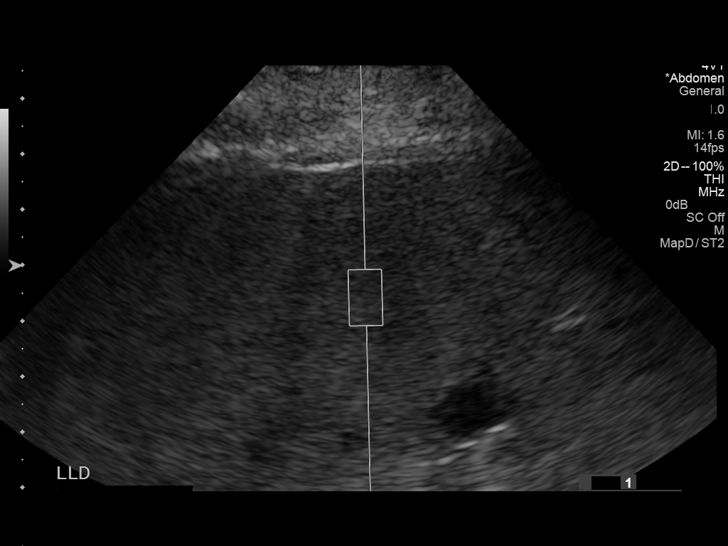
[im 8/29]
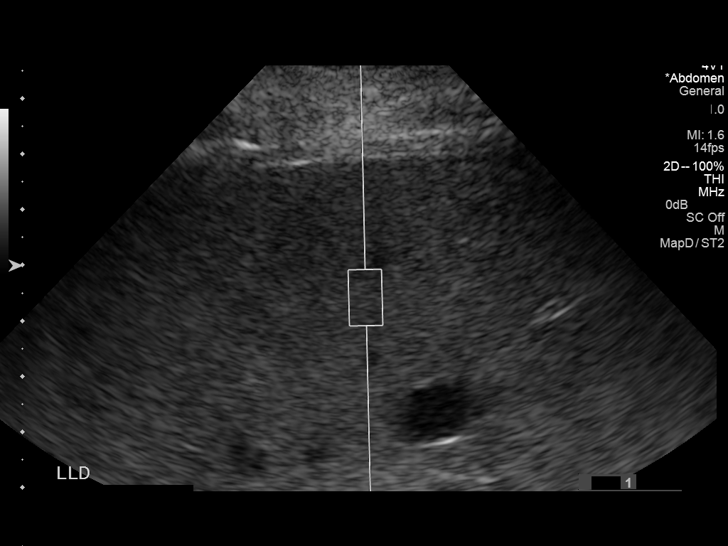
[im 10/29]
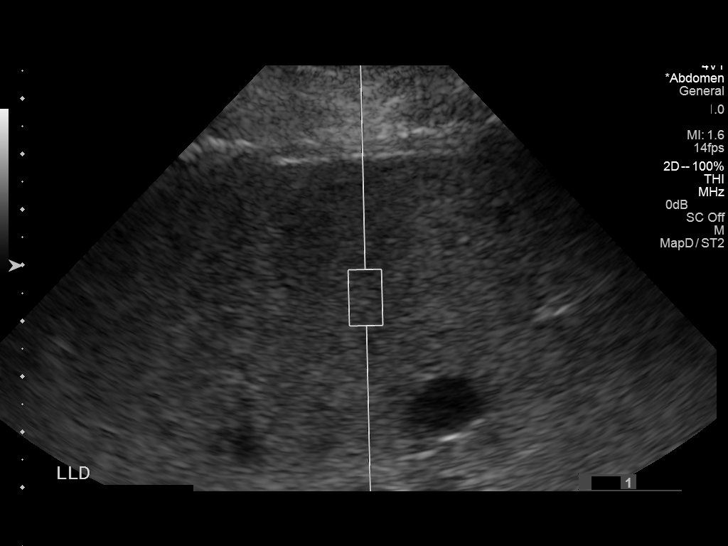
[im 12/29]
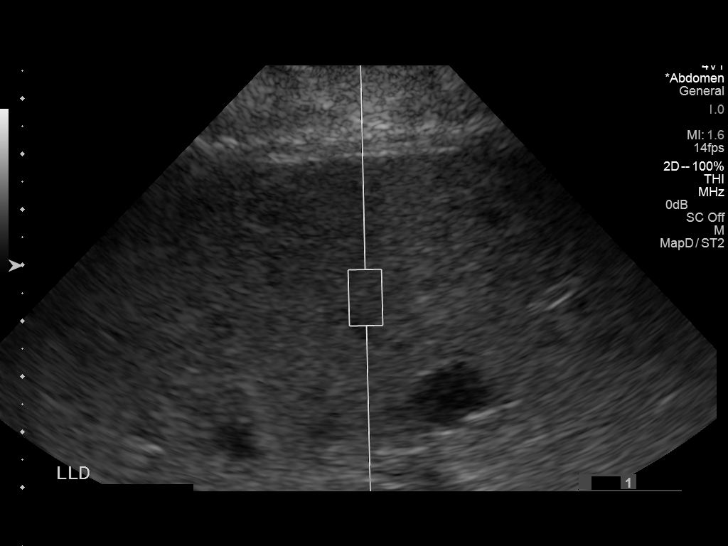
[im 15/29]
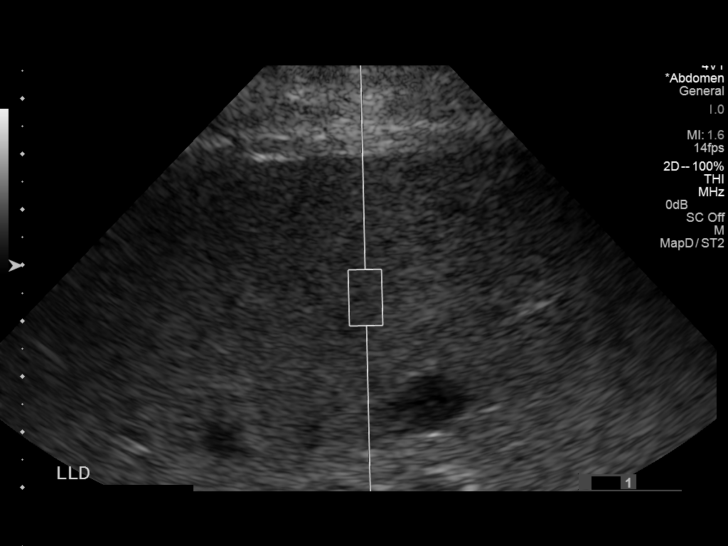
[im 17/29]
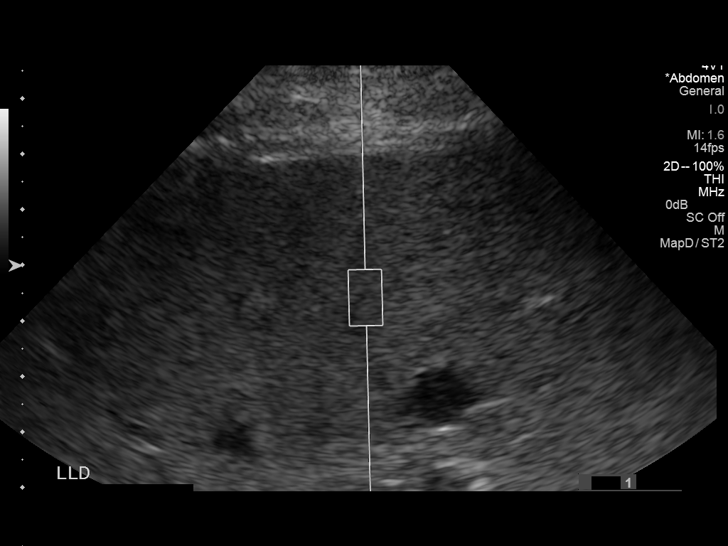
[im 19/29]
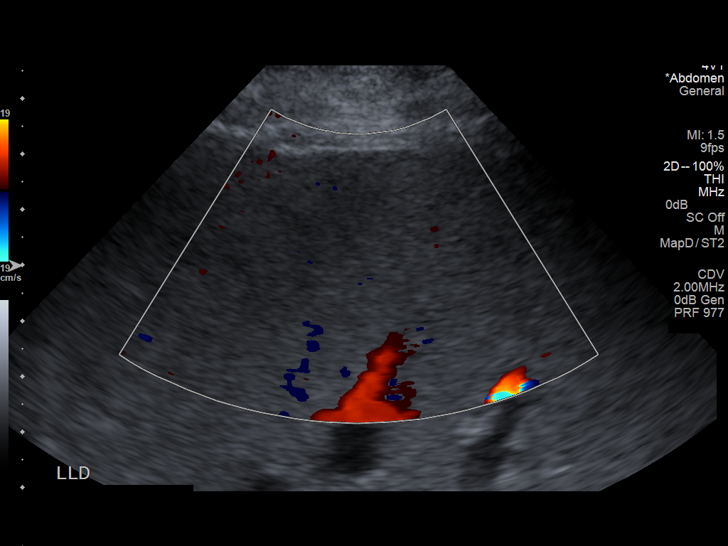
[im 22/29]
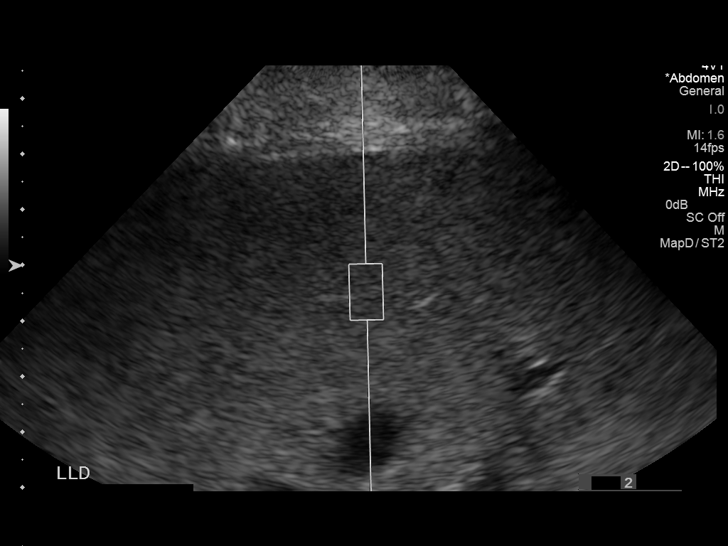
[im 24/29]
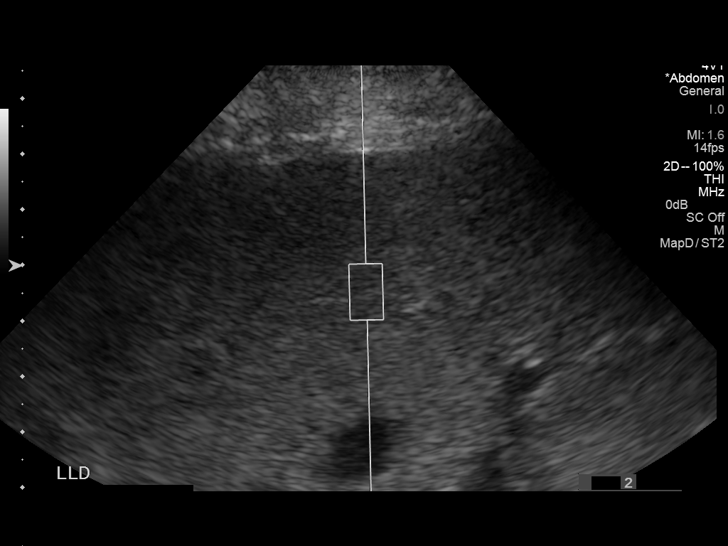
[im 26/29]
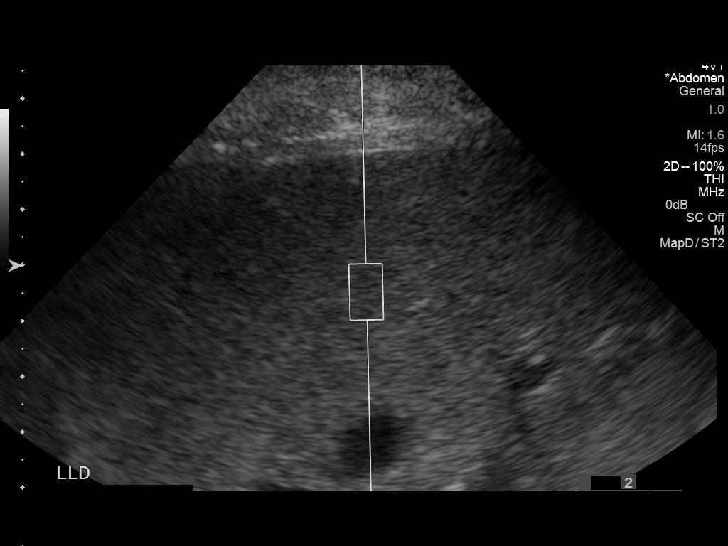
[im 29/29]
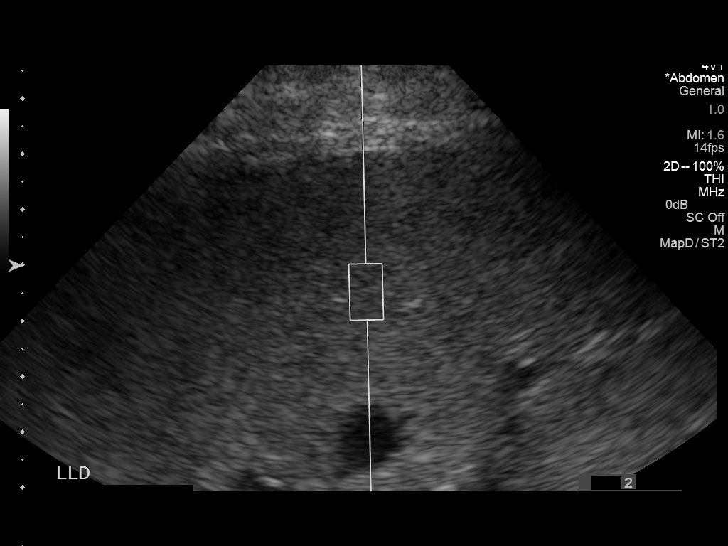

[13 of 25 positions shown; findings below may reference images not displayed]

FINDINGS: ULTRASOUND ABDOMEN

Gallbladder: Layering gallbladder sludge and/or small stones. No
gallbladder wall thickening or pericholecystic fluid.

Common bile duct: Diameter: 3 mm

Liver: Nodular hepatic contour with coarse echogenicity, raising the
possibility of cirrhosis.

IVC: No abnormality visualized.

Pancreas: Visualized portion unremarkable.

Spleen: At the upper limits of normal for size, measuring 12.3 cm.

Right Kidney: Length: 9.6 cm. Cortical thinning with increased
parenchymal echogenicity, suggesting medical renal disease.
Echogenic foci in the renal sinus, corresponding to vascular
calcifications when correlating with CT. No hydronephrosis.

Left Kidney: Length: 4.9 cm. No hydronephrosis.

Abdominal aorta: No aneurysm visualized.  Atherosclerosis.

Other findings: Right pleural effusion.

ULTRASOUND HEPATIC ELASTOGRAPHY

Device: Siemens Helix VTQ

Transducer 4V1

Patient position: Left lateral decubitus

Number of measurements:  8

Hepatic Segment:  8

Median velocity:   3.56  m/sec

IQR:

IQR/Median velocity ratio

Corresponding Metavir fibrosis score:  F3/F4

Risk of fibrosis: High

Limitations of exam: Liver motion, pleural effusion, previous use of
beta blockers

Pertinent findings noted on other imaging exams:  None

Please note that abnormal shear wave velocities may also be
identified in clinical settings other than with hepatic fibrosis,
such as: acute hepatitis, elevated right heart and central venous
pressures including use of beta blockers, Josephine disease
(Rudi), infiltrative processes such as
mastocytosis/amyloidosis/infiltrative tumor, extrahepatic
cholestasis, in the post-prandial state, and liver transplantation.
Correlation with patient history, laboratory data, and clinical
condition recommended.
IMPRESSION: Nodular hepatic contour with coarse echogenicity, raising the
possibility of cirrhosis.

Median hepatic shear wave velocity is calculated at 3.56 m/sec.

Corresponding Metavir fibrosis score is F3/F4.

Risk of fibrosis is high.

Follow-up:  Follow-up advised.

## 2016-01-05 IMAGING — CR DG CHEST 1V PORT
1 series · 1 of 1 positions shown · non-contrast
Comparison: 09/24/2014.

CLINICAL DATA: Sepsis.

EXAM:
PORTABLE CHEST - 1 VIEW

[AP]
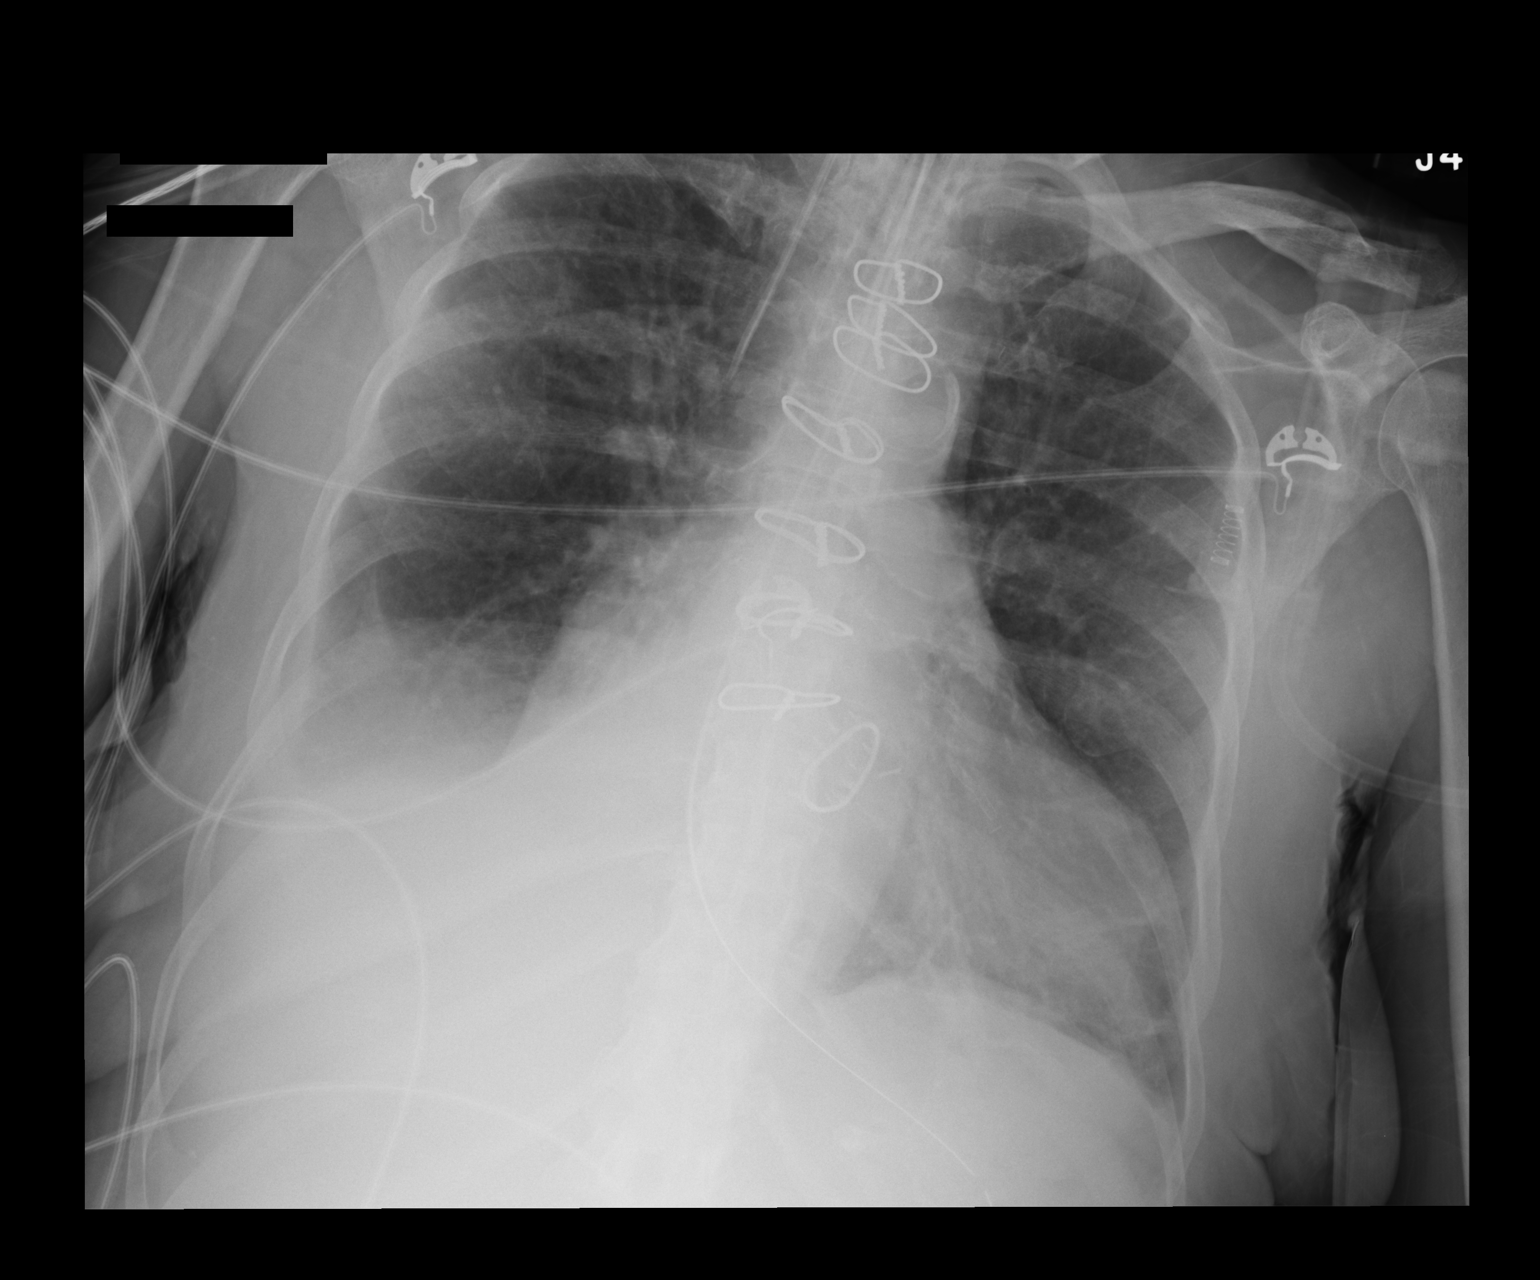

[1 of 1 positions shown; findings below may reference images not displayed]

FINDINGS: Support apparatus: Tip of the endotracheal tube is 45 mm from the
carina. Enteric tube remains present. RIGHT IJ central line with the
tip in the upper superior vena cava. Monitoring leads project over
the chest.

Cardiomediastinal Silhouette: Enlarged, unchanged. Mitral
annuloplasty ring and median sternotomy. Aortic arch
atherosclerosis.

Lungs: Right-greater-than-left basilar atelectasis appears similar
although there is some positional shift in the RIGHT effusion and
associated shifting atelectasis. No pneumothorax. Retrocardiac
subsegmental atelectasis.

Effusions:  Small to moderate RIGHT pleural effusion.

Other:  None.
IMPRESSION: 1. Stable support apparatus.
2. Unchanged cardiomegaly.
3. Positional shift in the RIGHT pleural effusion.
RIGHT-greater-than-LEFT basilar opacity compatible with atelectasis.
Superimposed airspace disease may be present at the RIGHT base.
